# Patient Record
Sex: Male | Born: 1945 | Hispanic: No | Marital: Single | Smoking: Never smoker
Health system: Southern US, Community
[De-identification: ages and names within clinical notes are randomized; demographics above are authoritative.]

## PROBLEM LIST (undated history)

## (undated) DIAGNOSIS — I10 Essential (primary) hypertension: Secondary | ICD-10-CM

## (undated) DIAGNOSIS — E782 Mixed hyperlipidemia: Secondary | ICD-10-CM

## (undated) DIAGNOSIS — B019 Varicella without complication: Secondary | ICD-10-CM

## (undated) DIAGNOSIS — E119 Type 2 diabetes mellitus without complications: Secondary | ICD-10-CM

## (undated) DIAGNOSIS — G5603 Carpal tunnel syndrome, bilateral upper limbs: Secondary | ICD-10-CM

## (undated) DIAGNOSIS — M1990 Unspecified osteoarthritis, unspecified site: Secondary | ICD-10-CM

## (undated) DIAGNOSIS — N138 Other obstructive and reflux uropathy: Secondary | ICD-10-CM

## (undated) DIAGNOSIS — Z973 Presence of spectacles and contact lenses: Secondary | ICD-10-CM

## (undated) DIAGNOSIS — E785 Hyperlipidemia, unspecified: Secondary | ICD-10-CM

## (undated) DIAGNOSIS — N401 Enlarged prostate with lower urinary tract symptoms: Secondary | ICD-10-CM

## (undated) DIAGNOSIS — A159 Respiratory tuberculosis unspecified: Secondary | ICD-10-CM

## (undated) DIAGNOSIS — M5382 Other specified dorsopathies, cervical region: Secondary | ICD-10-CM

## (undated) HISTORY — PX: CERVICAL FUSION: SHX112

## (undated) HISTORY — PX: PROSTATE BIOPSY: SHX241

## (undated) HISTORY — DX: Hyperlipidemia, unspecified: E78.5

## (undated) HISTORY — DX: Essential (primary) hypertension: I10

## (undated) HISTORY — DX: Varicella without complication: B01.9

## (undated) HISTORY — PX: BILATERAL CARPAL TUNNEL RELEASE: SHX6508

## (undated) HISTORY — PX: NECK SURGERY: SHX720

---
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
## 1988-12-21 HISTORY — PX: ANTERIOR FUSION CERVICAL SPINE: SUR626

## 2000-04-15 ENCOUNTER — Encounter: Admission: RE | Admit: 2000-04-15 | Discharge: 2000-04-15 | Payer: Self-pay | Admitting: Neurosurgery

## 2000-04-15 ENCOUNTER — Encounter: Payer: Self-pay | Admitting: Neurosurgery

## 2009-12-21 HISTORY — PX: COLONOSCOPY: SHX174

## 2010-01-16 LAB — HM COLONOSCOPY

## 2013-10-23 ENCOUNTER — Telehealth: Payer: Self-pay | Admitting: Family Medicine

## 2013-10-23 ENCOUNTER — Other Ambulatory Visit: Payer: Self-pay | Admitting: *Deleted

## 2013-10-23 DIAGNOSIS — I10 Essential (primary) hypertension: Secondary | ICD-10-CM

## 2013-10-23 MED ORDER — ATENOLOL 100 MG PO TABS: 100.0000 mg | ORAL_TABLET | Freq: Every day | ORAL | Status: DC

## 2013-10-23 MED ORDER — HYDROCHLOROTHIAZIDE 25 MG PO TABS: 25.0000 mg | ORAL_TABLET | Freq: Every day | ORAL | Status: DC

## 2013-10-23 NOTE — Telephone Encounter (Addendum)
Patient has a new patient apt with dr Laury Axon on January 29th 2015 but he needed two rx's refilled before his apt. Patient wanted to see if he could get a 90 day supply until his visit.   Refill Atenolol 100 mg  Hydrochlorothizaide 25 mg  Phamracy [facility] on [address]

## 2013-10-23 NOTE — Telephone Encounter (Signed)
Rx for atenolol and hctz sent to [facility] on [address]. Patient hs new to establish appt on 01/18/14, needs rx filled before then.

## 2014-01-16 ENCOUNTER — Telehealth: Payer: Self-pay

## 2014-01-16 NOTE — Telephone Encounter (Signed)
Was a patient at [facility] on [address].   Medication List and allergies:  Updated and Reviewed  90 day supply/mail order: n/a Local prescriptions:  [facility] on [address]  Immunization due:  Influenza-declined; Shingles-would like to receive upon appt.  A/P: No changes to personal, family or PSH Flu-Declined Tdap- "received more than a year ago" PNA-  "received more than a year ago" Shingles-Requesting  CCS-3-4 years ago per patient PSA- None on file   To discuss with provider: Wants to discuss knee pain.  Had an ortho. referral and says he needs another one.

## 2014-01-18 ENCOUNTER — Ambulatory Visit (INDEPENDENT_AMBULATORY_CARE_PROVIDER_SITE_OTHER): Payer: Medicare Other | Admitting: Family Medicine

## 2014-01-18 ENCOUNTER — Encounter: Payer: Self-pay | Admitting: Family Medicine

## 2014-01-18 VITALS — BP 120/68 | HR 83 | Temp 98.5°F | Ht 67.5 in | Wt 206.4 lb

## 2014-01-18 DIAGNOSIS — M25569 Pain in unspecified knee: Secondary | ICD-10-CM

## 2014-01-18 DIAGNOSIS — E669 Obesity, unspecified: Secondary | ICD-10-CM

## 2014-01-18 DIAGNOSIS — M25562 Pain in left knee: Secondary | ICD-10-CM

## 2014-01-18 DIAGNOSIS — E782 Mixed hyperlipidemia: Secondary | ICD-10-CM | POA: Insufficient documentation

## 2014-01-18 DIAGNOSIS — E785 Hyperlipidemia, unspecified: Secondary | ICD-10-CM

## 2014-01-18 DIAGNOSIS — Z23 Encounter for immunization: Secondary | ICD-10-CM

## 2014-01-18 DIAGNOSIS — I10 Essential (primary) hypertension: Secondary | ICD-10-CM

## 2014-01-18 DIAGNOSIS — M25561 Pain in right knee: Secondary | ICD-10-CM

## 2014-01-18 HISTORY — DX: Obesity, unspecified: E66.9

## 2014-01-18 MED ORDER — HYDROCHLOROTHIAZIDE 25 MG PO TABS: 25.0000 mg | ORAL_TABLET | Freq: Every day | ORAL | Status: DC

## 2014-01-18 MED ORDER — ZOSTER VACCINE LIVE 19400 UNT/0.65ML ~~LOC~~ SOLR: 0.6500 mL | Freq: Once | SUBCUTANEOUS | Status: DC

## 2014-01-18 MED ORDER — ATENOLOL 100 MG PO TABS: 100.0000 mg | ORAL_TABLET | Freq: Every day | ORAL | Status: DC

## 2014-01-18 NOTE — Progress Notes (Signed)
  Subjective:    Richard Harding is a 68 y.o. male who presents with knee pain involving both knees. Onset was gradual, starting about several months ago. Inciting event: none known. Current symptoms include: pain located Medially L > R. Pain is aggravated by any weight bearing. Patient has had prior knee problems R knee. Evaluation to date: plain films: oa  and Ortho consult: Dr Mayer Camel. Treatment to date: avoidance of offending activity and brace which is not very effective.  The following portions of the patient's history were reviewed and updated as appropriate: allergies, current medications, past family history, past medical history, past social history, past surgical history and problem list.   Review of Systems Pertinent items are noted in HPI.   Objective:    BP 120/68  Pulse 83  Temp(Src) 98.5 F (36.9 C) (Oral)  Ht 5' 7.5" (1.715 m)  Wt 206 lb 6.4 oz (93.622 kg)  BMI 31.83 kg/m2  SpO2 97%      General appearance: alert, cooperative, appears stated age and no distress Neck: no adenopathy, no carotid bruit, no JVD, supple, symmetrical, trachea midline and thyroid not enlarged, symmetric, no tenderness/mass/nodules Lungs: clear to auscultation bilaterally Heart: S1, S2 normal Extremities: extremities normal, atraumatic, no cyanosis or edema  Right knee: normal and no effusion, full active range of motion, no joint line tenderness, ligamentous structures intact.  Left knee:  positive exam findings: effusion and crepitus   X-ray both knees: no fracture, dislocation, swelling or degenerative changes noted and not done    Assessment:    Bilateral knee pain     Plan:    Orthopedics referral.  Knee sleeve

## 2014-01-18 NOTE — Assessment & Plan Note (Signed)
Check labs Pt states he will not go on a statin

## 2014-01-18 NOTE — Progress Notes (Signed)
Pre visit review using our clinic review tool, if applicable. No additional management support is needed unless otherwise documented below in the visit note. 

## 2014-01-18 NOTE — Patient Instructions (Signed)

## 2014-01-18 NOTE — Assessment & Plan Note (Signed)
Stable con't meds  rto cpe 

## 2014-01-19 ENCOUNTER — Telehealth: Payer: Self-pay | Admitting: Family Medicine

## 2014-01-19 ENCOUNTER — Other Ambulatory Visit: Payer: Medicare Other

## 2014-01-19 LAB — BASIC METABOLIC PANEL
BUN: 16 mg/dL (ref 6–23)
CALCIUM: 9.6 mg/dL (ref 8.4–10.5)
CO2: 30 mEq/L (ref 19–32)
CREATININE: 0.9 mg/dL (ref 0.4–1.5)
Chloride: 95 mEq/L — ABNORMAL LOW (ref 96–112)
GFR: 86.24 mL/min (ref >60.00)
GLUCOSE: 99 mg/dL (ref 70–99)
Potassium: 3.3 mEq/L — ABNORMAL LOW (ref 3.5–5.1)
SODIUM: 136 meq/L (ref 135–145)

## 2014-01-19 LAB — LIPID PANEL
CHOLESTEROL: 231 mg/dL — AB (ref 0–200)
HDL: 33.8 mg/dL — AB (ref >39.00)
Total CHOL/HDL Ratio: 7
Triglycerides: 353 mg/dL — ABNORMAL HIGH (ref 0.0–149.0)
VLDL: 70.6 mg/dL — ABNORMAL HIGH (ref 0.0–40.0)

## 2014-01-19 LAB — HEPATIC FUNCTION PANEL
ALK PHOS: 58 U/L (ref 39–117)
ALT: 33 U/L (ref 0–53)
AST: 22 U/L (ref 0–37)
Albumin: 4 g/dL (ref 3.5–5.2)
Bilirubin, Direct: 0 mg/dL (ref 0.0–0.3)
TOTAL PROTEIN: 7.7 g/dL (ref 6.0–8.3)
Total Bilirubin: 1.1 mg/dL (ref 0.3–1.2)

## 2014-01-19 LAB — LDL CHOLESTEROL, DIRECT: Direct LDL: 138.6 mg/dL

## 2014-01-19 NOTE — Telephone Encounter (Signed)
Relevant patient education mailed to patient.  

## 2014-09-03 ENCOUNTER — Ambulatory Visit (INDEPENDENT_AMBULATORY_CARE_PROVIDER_SITE_OTHER): Payer: Medicare Other | Admitting: Family Medicine

## 2014-09-03 ENCOUNTER — Encounter: Payer: Self-pay | Admitting: Family Medicine

## 2014-09-03 VITALS — BP 142/78 | HR 94 | Temp 99.3°F | Wt 212.5 lb

## 2014-09-03 DIAGNOSIS — R599 Enlarged lymph nodes, unspecified: Secondary | ICD-10-CM

## 2014-09-03 DIAGNOSIS — R591 Generalized enlarged lymph nodes: Secondary | ICD-10-CM

## 2014-09-03 DIAGNOSIS — I10 Essential (primary) hypertension: Secondary | ICD-10-CM

## 2014-09-03 DIAGNOSIS — E785 Hyperlipidemia, unspecified: Secondary | ICD-10-CM

## 2014-09-03 MED ORDER — HYDROCHLOROTHIAZIDE 25 MG PO TABS: 25.0000 mg | ORAL_TABLET | Freq: Every day | ORAL | Status: DC

## 2014-09-03 MED ORDER — ATENOLOL 100 MG PO TABS: ORAL_TABLET | ORAL | Status: DC

## 2014-09-03 NOTE — Progress Notes (Signed)
  Subjective:    Patient here for follow-up of elevated blood pressure.  He is not exercising and is adherent to a low-salt diet.  Blood pressure is not well controlled at home. Cardiac symptoms: none. Patient denies: chest pain, chest pressure/discomfort, claudication, dyspnea, exertional chest pressure/discomfort, fatigue, irregular heart beat, lower extremity edema, near-syncope, orthopnea, palpitations, paroxysmal nocturnal dyspnea, syncope and tachypnea. Cardiovascular risk factors: advanced age (older than 73 for men, 21 for women), dyslipidemia, hypertension and male gender. Use of agents associated with hypertension: none. History of target organ damage: none.  Pt c/o enlarged lymphnodes x 1 year.  No recent illness, no fever.  The following portions of the patient's history were reviewed and updated as appropriate: allergies, current medications, past family history, past medical history, past social history, past surgical history and problem list.  Review of Systems Pertinent items are noted in HPI.     Objective:    BP 142/78  Pulse 94  Temp(Src) 99.3 F (37.4 C) (Oral)  Wt 212 lb 8.4 oz (96.4 kg)  SpO2 99% General appearance: alert, cooperative, appears stated age and no distress Throat: lips, mucosa, and tongue normal; teeth and gums normal Neck: moderate anterior cervical adenopathy, supple, symmetrical, trachea midline and thyroid not enlarged, symmetric, no tenderness/mass/nodules Lungs: clear to auscultation bilaterally Heart: S1, S2 normal Extremities: extremities normal, atraumatic, no cyanosis or edema    Assessment:    Hypertension, stage 1 . Evidence of target organ damage: none.    Plan:    Medication: increase to atenolol 150 mg daily. Dietary sodium restriction. Regular aerobic exercise. Check blood pressures 2-3 times weekly and record. Follow up: 3 months and as needed.   1. Essential hypertension, benign   - hydrochlorothiazide (HYDRODIURIL) 25 MG  tablet; Take 1 tablet (25 mg total) by mouth daily.  Dispense: 90 tablet; Refill: 3 - atenolol (TENORMIN) 100 MG tablet; 1 1/2 tab po qd  Dispense: 135 tablet; Refill: 3  2. Lymphadenopathy US neck - CBC with Differential  3. Other and unspecified hyperlipidemia Check labs - Basic metabolic panel - Hepatic function panel - Lipid panel

## 2014-09-03 NOTE — Patient Instructions (Signed)

## 2014-09-03 NOTE — Progress Notes (Signed)
Pre visit review using our clinic review tool, if applicable. No additional management support is needed unless otherwise documented below in the visit note. 

## 2014-09-04 LAB — LIPID PANEL
Cholesterol: 213 mg/dL — ABNORMAL HIGH (ref 0–200)
HDL: 30.8 mg/dL — ABNORMAL LOW (ref >39.00)
NonHDL: 182.2
Total CHOL/HDL Ratio: 7
Triglycerides: 553 mg/dL — ABNORMAL HIGH (ref 0.0–149.0)
VLDL: 110.6 mg/dL — ABNORMAL HIGH (ref 0.0–40.0)

## 2014-09-04 LAB — BASIC METABOLIC PANEL
BUN: 10 mg/dL (ref 6–23)
CO2: 26 mEq/L (ref 19–32)
Calcium: 9.5 mg/dL (ref 8.4–10.5)
Chloride: 97 mEq/L (ref 96–112)
Creatinine, Ser: 0.9 mg/dL (ref 0.4–1.5)
GFR: 89.39 mL/min (ref >60.00)
Glucose, Bld: 133 mg/dL — ABNORMAL HIGH (ref 70–99)
Potassium: 3.2 mEq/L — ABNORMAL LOW (ref 3.5–5.1)
Sodium: 136 mEq/L (ref 135–145)

## 2014-09-04 LAB — LDL CHOLESTEROL, DIRECT: Direct LDL: 123.7 mg/dL

## 2014-09-04 LAB — HEPATIC FUNCTION PANEL
ALT: 37 U/L (ref 0–53)
AST: 30 U/L (ref 0–37)
Albumin: 3.9 g/dL (ref 3.5–5.2)
Alkaline Phosphatase: 50 U/L (ref 39–117)
Bilirubin, Direct: 0 mg/dL (ref 0.0–0.3)
Total Bilirubin: 0.4 mg/dL (ref 0.2–1.2)
Total Protein: 7.3 g/dL (ref 6.0–8.3)

## 2014-09-05 ENCOUNTER — Ambulatory Visit (HOSPITAL_BASED_OUTPATIENT_CLINIC_OR_DEPARTMENT_OTHER)
Admission: RE | Admit: 2014-09-05 | Discharge: 2014-09-05 | Disposition: A | Discharge status: Home / Self Care | Payer: Medicare Other | Source: Ambulatory Visit | Attending: Family Medicine | Admitting: Family Medicine

## 2014-09-05 DIAGNOSIS — R591 Generalized enlarged lymph nodes: Secondary | ICD-10-CM

## 2014-09-05 DIAGNOSIS — E041 Nontoxic single thyroid nodule: Secondary | ICD-10-CM | POA: Insufficient documentation

## 2014-09-05 DIAGNOSIS — E049 Nontoxic goiter, unspecified: Secondary | ICD-10-CM | POA: Diagnosis present

## 2014-09-05 LAB — CBC WITH DIFFERENTIAL/PLATELET
Basophils Absolute: 0 10*3/uL (ref 0.0–0.1)
Basophils Relative: 0.5 % (ref 0.0–3.0)
Eosinophils Absolute: 0.1 10*3/uL (ref 0.0–0.7)
Eosinophils Relative: 1.1 % (ref 0.0–5.0)
HCT: 46 % (ref 39.0–52.0)
Hemoglobin: 15.8 g/dL (ref 13.0–17.0)
Lymphocytes Relative: 34.1 % (ref 12.0–46.0)
Lymphs Abs: 2.7 10*3/uL (ref 0.7–4.0)
MCHC: 34.3 g/dL (ref 30.0–36.0)
MCV: 87.6 fl (ref 78.0–100.0)
Monocytes Absolute: 0.4 10*3/uL (ref 0.1–1.0)
Monocytes Relative: 4.5 % (ref 3.0–12.0)
Neutro Abs: 4.7 10*3/uL (ref 1.4–7.7)
Neutrophils Relative %: 59.8 % (ref 43.0–77.0)
Platelets: 243 10*3/uL (ref 150.0–400.0)
RBC: 5.26 Mil/uL (ref 4.22–5.81)
RDW: 12.6 % (ref 11.5–15.5)
WBC: 7.8 10*3/uL (ref 4.0–10.5)

## 2014-09-07 ENCOUNTER — Telehealth: Payer: Self-pay

## 2014-09-07 DIAGNOSIS — E010 Iodine-deficiency related diffuse (endemic) goiter: Secondary | ICD-10-CM

## 2014-09-07 NOTE — Telephone Encounter (Signed)
Message copied by Ewing Schlein on Fri Sep 07, 2014  9:12 AM ------      Message from: Rosalita Chessman      Created: Wed Sep 05, 2014 10:04 PM       K is low---  Add Kcl 20  Meq 1po qd , #30 2 refills      TG are high---  Fenofibrate 160  Mg #30 1 po qd, 2 refills      Recheck 3 months------ bmp, hep, lipid  272.4 ------

## 2014-09-07 NOTE — Telephone Encounter (Signed)
There is no K supplement on the $4 list

## 2014-09-07 NOTE — Telephone Encounter (Signed)
spoke with patient and he said he stated he does not have prescription coverage and would like a medication on the 4 dollar list at [facility]. List placed on the ledge.    KP

## 2014-09-10 ENCOUNTER — Telehealth: Payer: Self-pay | Admitting: Family Medicine

## 2014-09-10 MED ORDER — FENOFIBRATE 160 MG PO TABS: 160.0000 mg | ORAL_TABLET | Freq: Every day | ORAL | Status: DC

## 2014-09-10 MED ORDER — POTASSIUM CHLORIDE CRYS ER 20 MEQ PO TBCR: 20.0000 meq | EXTENDED_RELEASE_TABLET | Freq: Every day | ORAL | Status: DC

## 2014-09-10 NOTE — Telephone Encounter (Signed)
Mailed      KP

## 2014-09-10 NOTE — Telephone Encounter (Signed)
There is nothing on $4 list for TG

## 2014-09-10 NOTE — Telephone Encounter (Signed)
For his triglycerides.     KP

## 2014-09-10 NOTE — Telephone Encounter (Signed)
Requesting lab work and [place] report mailed to his home

## 2015-02-01 ENCOUNTER — Ambulatory Visit: Payer: Medicare Other | Attending: Orthopedic Surgery | Admitting: Physical Therapy

## 2015-02-01 DIAGNOSIS — M5416 Radiculopathy, lumbar region: Secondary | ICD-10-CM | POA: Insufficient documentation

## 2015-02-01 DIAGNOSIS — M545 Low back pain: Secondary | ICD-10-CM | POA: Diagnosis not present

## 2015-02-04 ENCOUNTER — Encounter: Payer: Self-pay | Admitting: Physical Therapy

## 2015-02-08 ENCOUNTER — Ambulatory Visit: Payer: Medicare Other | Admitting: Physical Therapy

## 2015-02-08 ENCOUNTER — Encounter: Payer: Self-pay | Admitting: Physical Therapy

## 2015-02-08 DIAGNOSIS — M5416 Radiculopathy, lumbar region: Secondary | ICD-10-CM

## 2015-02-08 NOTE — Therapy (Signed)
[place], [place], [address] Phone: [phone]   Fax:  [phone]  Physical Therapy Treatment  Patient Details  Name: Richard Harding MRN: [id number] Date of Birth: [birth year] Referring Provider:  Elie Goody, *  Encounter Date: 02/08/2015      PT End of Session - 02/08/15 1033    Visit Number 2   Date for PT Re-Evaluation 04/03/15   PT Start Time 0925   PT Stop Time 1030   PT Time Calculation (min) 65 min      Past Medical History  Diagnosis Date  . Hypertension   . Hyperlipemia   . Chickenpox     Past Surgical History  Procedure Laterality Date  . Neck surgery      There were no vitals taken for this visit.  Visit Diagnosis:  Lumbar radiculopathy      Subjective Assessment - 02/08/15 0933    Symptoms Reports that since last visit he has less pain and is taking less pain medication   Limitations Sitting;Standing;Walking   How long can you stand comfortably? 15 minutes   How long can you walk comfortably? 10 minutes   Patient Stated Goals no pain   Currently in Pain? Yes   Pain Score 3    Pain Location Back   Pain Orientation Lower;Right   Pain Descriptors / Indicators Aching;Radiating;Sharp   Pain Type Acute pain   Pain Radiating Towards right thigh   Pain Onset More than a month ago   Pain Frequency Intermittent   Aggravating Factors  standing still   Pain Relieving Factors easy movements   Effect of Pain on Daily Activities limited with standing   Multiple Pain Sites No                    [facility] Adult PT Treatment/Exercise - 02/08/15 0001    Bed Mobility   Bed Mobility --  instructed in proper body mechanics   Lumbar Exercises: Stretches   Passive Hamstring Stretch 30 seconds  3 reps   Single Knee to Chest Stretch 5 reps;20 seconds   Double Knee to Chest Stretch 5 reps;10 seconds   Lower Trunk Rotation 5 reps;10 seconds   Pelvic Tilt 5  reps;10 seconds   Piriformis Stretch 30 seconds;5 reps   Lumbar Exercises: Machines for Strengthening   Other Lumbar Machine Exercise NuSteo 5 minutes level 5   Lumbar Exercises: Supine   Glut Set 10 reps;3 seconds   Dead Bug 5 reps;2 seconds   Lumbar Exercises: Prone   Opposite Arm/Leg Raise 10 reps;1 second   Lumbar Exercises: Quadruped   Single Arm Raise 5 reps;2 seconds   Straight Leg Raise 5 reps;2 seconds   Opposite Arm/Leg Raise 5 reps;2 seconds   Modalities   Modalities Ultrasound   Ultrasound   Ultrasound Location right buttock SI area   Ultrasound Goals Pain                PT Education - 02/08/15 1032    Education provided Yes   Education Details body mechanics, flexibility and core strength   Person(s) Educated Patient   Methods Explanation;Demonstration;Tactile cues;Verbal cues;Handout   Comprehension Verbal cues required;Returned demonstration;Verbalized understanding             PT Long Term Goals - 02/08/15 1035    PT LONG TERM GOAL #1   Title independent with proper posture and bidy mechanics   Time 4  Period Weeks   Status New   PT LONG TERM GOAL #2   Title independent with HEP   Time 4   Period Weeks   Status New               Plan - 02/08/15 1033    Clinical Impression Statement Patient has lumbar radiculopathy, he reports less pain after first PT session, taking less pain medication.  Has higher co-pay so wants to limit visits   Pt will benefit from skilled therapeutic intervention in order to improve on the following deficits Pain;Impaired flexibility   Rehab Potential Good   PT Frequency 1x / week   PT Duration 4 weeks   PT Treatment/Interventions Ultrasound;Traction;Moist Heat;Therapeutic activities;Therapeutic exercise;Passive range of motion;Patient/family education   PT Next Visit Plan will hold PT due to high co-pay and him feeling confident with the less pain and his HEP   Consulted and Agree with Plan of Care  Patient        Problem List Patient Active Problem List   Diagnosis Date Noted  . HTN (hypertension) 01/18/2014  . Other and unspecified hyperlipidemia 01/18/2014  . Obesity (BMI 30-39.9) 01/18/2014    ALBRIGHT,MICHAEL W,PT 02/08/2015, 10:37 AM  [place] [address] [place], [place], [address] Phone: [phone]   Fax:  [phone]

## 2015-09-16 ENCOUNTER — Ambulatory Visit (INDEPENDENT_AMBULATORY_CARE_PROVIDER_SITE_OTHER): Payer: Medicare Other | Admitting: Family Medicine

## 2015-09-16 ENCOUNTER — Encounter: Payer: Self-pay | Admitting: Family Medicine

## 2015-09-16 VITALS — BP 132/72 | HR 73 | Temp 98.3°F | Ht 68.0 in | Wt 203.2 lb

## 2015-09-16 DIAGNOSIS — Z23 Encounter for immunization: Secondary | ICD-10-CM | POA: Diagnosis not present

## 2015-09-16 DIAGNOSIS — H6123 Impacted cerumen, bilateral: Secondary | ICD-10-CM | POA: Diagnosis not present

## 2015-09-16 DIAGNOSIS — E785 Hyperlipidemia, unspecified: Secondary | ICD-10-CM | POA: Diagnosis not present

## 2015-09-16 DIAGNOSIS — I10 Essential (primary) hypertension: Secondary | ICD-10-CM | POA: Diagnosis not present

## 2015-09-16 DIAGNOSIS — Z1159 Encounter for screening for other viral diseases: Secondary | ICD-10-CM

## 2015-09-16 DIAGNOSIS — H918X3 Other specified hearing loss, bilateral: Secondary | ICD-10-CM

## 2015-09-16 LAB — COMPLETE METABOLIC PANEL WITH GFR
ALT: 26 U/L (ref 9–46)
AST: 18 U/L (ref 10–35)
Albumin: 4.2 g/dL (ref 3.6–5.1)
Alkaline Phosphatase: 49 U/L (ref 40–115)
BUN: 14 mg/dL (ref 7–25)
CHLORIDE: 96 mmol/L — AB (ref 98–110)
CO2: 28 mmol/L (ref 20–31)
CREATININE: 0.85 mg/dL (ref 0.70–1.25)
Calcium: 9.5 mg/dL (ref 8.6–10.3)
Glucose, Bld: 119 mg/dL — ABNORMAL HIGH (ref 65–99)
Potassium: 3.6 mmol/L (ref 3.5–5.3)
Sodium: 134 mmol/L — ABNORMAL LOW (ref 135–146)
Total Bilirubin: 0.6 mg/dL (ref 0.2–1.2)
Total Protein: 7.2 g/dL (ref 6.1–8.1)

## 2015-09-16 LAB — LDL CHOLESTEROL, DIRECT: Direct LDL: 157 mg/dL

## 2015-09-16 LAB — LIPID PANEL
CHOLESTEROL: 227 mg/dL — AB (ref 0–200)
HDL: 35.2 mg/dL — AB (ref >39.00)
NonHDL: 191.31
TRIGLYCERIDES: 342 mg/dL — AB (ref 0.0–149.0)
Total CHOL/HDL Ratio: 6
VLDL: 68.4 mg/dL — ABNORMAL HIGH (ref 0.0–40.0)

## 2015-09-16 MED ORDER — HYDROCHLOROTHIAZIDE 25 MG PO TABS: 25.0000 mg | ORAL_TABLET | Freq: Every day | ORAL | Status: DC

## 2015-09-16 MED ORDER — ATENOLOL 100 MG PO TABS: ORAL_TABLET | ORAL | Status: DC

## 2015-09-16 NOTE — Patient Instructions (Signed)

## 2015-09-16 NOTE — Progress Notes (Signed)
Pre visit review using our clinic review tool, if applicable. No additional management support is needed unless otherwise documented below in the visit note. 

## 2015-09-16 NOTE — Assessment & Plan Note (Signed)
Unable to use cerumen hoop Irrigated with some success but still wax deep in. Use debrox---- rto 1 week prn

## 2015-09-16 NOTE — Progress Notes (Signed)
Patient ID: Richard Harding, male    DOB: 06-18-1946  Age: 69 y.o. MRN: [id number]    Subjective:  Subjective HPI Richard Harding presents for f/u cholesterol and bp.   He denies cp, htn  Review of Systems  Constitutional: Negative for diaphoresis, appetite change, fatigue and unexpected weight change.  HENT: Positive for ear pain and hearing loss. Negative for ear discharge, rhinorrhea, sinus pressure, sneezing, sore throat, tinnitus, trouble swallowing and voice change.   Eyes: Negative for pain, redness and visual disturbance.  Respiratory: Negative for cough, chest tightness, shortness of breath and wheezing.   Cardiovascular: Negative for chest pain, palpitations and leg swelling.  Endocrine: Negative for cold intolerance, heat intolerance, polydipsia, polyphagia and polyuria.  Genitourinary: Negative for dysuria, frequency and difficulty urinating.  Neurological: Negative for dizziness, light-headedness, numbness and headaches.    History Past Medical History  Diagnosis Date  . Hypertension   . Hyperlipemia   . Chickenpox     He has past surgical history that includes Neck surgery.   His family history is not on file.He reports that he has never smoked. He has never used smokeless tobacco. He reports that he does not drink alcohol or use illicit drugs.  Current Outpatient Prescriptions on File Prior to Visit  Medication Sig Dispense Refill  . Ascorbic Acid (VITAMIN C) 1000 MG tablet Take 2,000 mg by mouth daily.    Marland Kitchen aspirin 325 MG tablet Take 325 mg by mouth daily.    Marland Kitchen b complex vitamins tablet Take 2 tablets by mouth daily.    . fenofibrate 160 MG tablet Take 1 tablet (160 mg total) by mouth daily. 30 tablet 2  . Flaxseed, Linseed, (FLAX SEED OIL PO) Take 3 capsules by mouth daily. 1400 mg each    . GLUCOSA-CHONDR-NA CHONDR-MSM PO Take 2 capsules by mouth daily.    . Misc Natural Products (PROSTATE HEALTH PO) Take 1 capsule by mouth daily.     . Multiple Vitamin  (MULTIVITAMIN) tablet Take 1 tablet by mouth daily.    . Omega-3 Fatty Acids (FISH OIL) 1000 MG CAPS Take 4 capsules by mouth daily.    . potassium chloride SA (K-DUR,KLOR-CON) 20 MEQ tablet Take 1 tablet (20 mEq total) by mouth daily. 30 tablet 2  . niacin 500 MG tablet Take 500 mg by mouth daily.     No current facility-administered medications on file prior to visit.     Objective:  Objective Physical Exam  Constitutional: He is oriented to person, place, and time. Vital signs are normal. He appears well-developed and well-nourished. He is sleeping.  HENT:  Head: Normocephalic and atraumatic.  Mouth/Throat: Oropharynx is clear and moist.  Both ears + cerumen-- unable to remove with hoop Ears irrigated---- still with some wax Very deep in canal  Eyes: EOM are normal. Pupils are equal, round, and reactive to light.  Neck: Normal range of motion. Neck supple. No thyromegaly present.  Cardiovascular: Normal rate and regular rhythm.   No murmur heard. Pulmonary/Chest: Effort normal and breath sounds normal. No respiratory distress. He has no wheezes. He has no rales. He exhibits no tenderness.  Musculoskeletal: He exhibits no edema or tenderness.  Neurological: He is alert and oriented to person, place, and time.  Skin: Skin is warm and dry.  Psychiatric: He has a normal mood and affect. His behavior is normal. Judgment and thought content normal.   BP 132/72 mmHg  Pulse 73  Temp(Src) 98.3 F (36.8 C) (Oral)  Ht 5\' 8"  (  1.727 m)  Wt 203 lb 3.2 oz (92.171 kg)  BMI 30.90 kg/m2  SpO2 98% Wt Readings from Last 3 Encounters:  09/16/15 203 lb 3.2 oz (92.171 kg)  09/03/14 212 lb 8.4 oz (96.4 kg)  01/18/14 206 lb 6.4 oz (93.622 kg)     Lab Results  Component Value Date   WBC 7.8 09/03/2014   HGB 15.8 09/03/2014   HCT 46.0 09/03/2014   PLT 243.0 09/03/2014   GLUCOSE 133* 09/03/2014   CHOL 227* 09/16/2015   TRIG 342.0* 09/16/2015   HDL 35.20* 09/16/2015   LDLDIRECT 157.0  09/16/2015   ALT 37 09/03/2014   AST 30 09/03/2014   NA 136 09/03/2014   K 3.2* 09/03/2014   CL 97 09/03/2014   CREATININE 0.9 09/03/2014   BUN 10 09/03/2014   CO2 26 09/03/2014    US Soft Tissue Head/neck  09/05/2014   CLINICAL DATA:  Thyromegaly  EXAM: THYROID ULTRASOUND  TECHNIQUE: Ultrasound examination of the thyroid gland and adjacent soft tissues was performed.  COMPARISON:  None.  FINDINGS: Right thyroid lobe  Measurements: 4.7 x 1.9 x 1.7 cm. 4 mm hypoechoic anterior superior nodule.  Left thyroid lobe  Measurements: 4.4 x 1.6 x 1.4 cm. Heterogeneous gland without nodule.  Isthmus  Thickness: 3 mm.  No nodules visualized.  Lymphadenopathy  None visualized.  IMPRESSION: 4 mm right upper pole nodule. Heterogeneous glandular tissue. Findings do not meet current SRU consensus criteria for biopsy. Follow-up by clinical exam is recommended. If patient has known risk factors for thyroid carcinoma, consider follow-up ultrasound in 12 months. If patient is clinically hyperthyroid, consider nuclear medicine thyroid uptake and scan.Reference: Management of Thyroid Nodules Detected at [place]: Society of Radiologists in [place]. Radiology 2005; [id number].   Electronically Signed   By: Maryclare Bean M.D.   On: 09/05/2014 16:35     Assessment & Plan:  Plan I have discontinued [facility]. [facility] Wort, calcium carbonate, and vitamin E. I am also having him maintain his aspirin, Fish Oil, vitamin C, (Flaxseed, Linseed, (FLAX SEED OIL PO)), Misc Natural Products (PROSTATE HEALTH PO), b complex vitamins, niacin, multivitamin, GLUCOSA-CHONDR-NA CHONDR-MSM PO, fenofibrate, potassium chloride SA, atenolol, and hydrochlorothiazide.  Meds ordered this encounter  Medications  . atenolol (TENORMIN) 100 MG tablet    Sig: 1 1/2 tab po qd    Dispense:  135 tablet    Refill:  3  . hydrochlorothiazide (HYDRODIURIL) 25 MG tablet    Sig: Take 1 tablet (25 mg total) by mouth daily.     Dispense:  90 tablet    Refill:  3    Problem List Items Addressed This Visit    Bilateral hearing loss due to cerumen impaction    Unable to use cerumen hoop Irrigated with some success but still wax deep in. Use debrox---- rto 1 week prn       Other Visit Diagnoses    Essential hypertension    -  Primary    Relevant Medications    atenolol (TENORMIN) 100 MG tablet    hydrochlorothiazide (HYDRODIURIL) 25 MG tablet    Other Relevant Orders    COMPLETE METABOLIC PANEL WITH GFR    Lipid panel (Completed)    Hyperlipidemia        Relevant Medications    atenolol (TENORMIN) 100 MG tablet    hydrochlorothiazide (HYDRODIURIL) 25 MG tablet    Other Relevant Orders    COMPLETE METABOLIC PANEL WITH GFR    Lipid panel (  Completed)    Essential hypertension, benign        Relevant Medications    atenolol (TENORMIN) 100 MG tablet    hydrochlorothiazide (HYDRODIURIL) 25 MG tablet    Need for hepatitis C screening test        Relevant Orders    Hepatitis C antibody    Encounter for immunization        Need for vaccination with 13-polyvalent pneumococcal conjugate vaccine        Relevant Orders    Pneumococcal conjugate vaccine 13-valent (Completed)       Follow-up: Return in about 6 months (around 03/15/2016), or if symptoms worsen or fail to improve, for annual exam, fasting.  Garnet Koyanagi, DO

## 2015-09-17 LAB — HEPATITIS C ANTIBODY: HCV Ab: NEGATIVE

## 2015-09-25 ENCOUNTER — Other Ambulatory Visit: Payer: Self-pay

## 2015-09-25 MED ORDER — OMEGA-3-ACID ETHYL ESTERS 1 G PO CAPS: 2.0000 g | ORAL_CAPSULE | Freq: Two times a day (BID) | ORAL | Status: DC

## 2016-01-13 ENCOUNTER — Telehealth: Payer: Self-pay | Admitting: Family Medicine

## 2016-01-13 MED ORDER — OMEGA-3-ACID ETHYL ESTERS 1 G PO CAPS: 2.0000 g | ORAL_CAPSULE | Freq: Two times a day (BID) | ORAL | Status: DC

## 2016-01-13 NOTE — Telephone Encounter (Signed)
[facility] [address]  Pt needs omega-3 acid ethyl esters (LOVAZA) 1 G capsule sent in. He's going today.

## 2016-01-13 NOTE — Telephone Encounter (Signed)
Rx faxed, apt scheduled for March.     KP

## 2016-03-16 ENCOUNTER — Encounter: Payer: Self-pay | Admitting: Family Medicine

## 2016-03-16 ENCOUNTER — Ambulatory Visit (INDEPENDENT_AMBULATORY_CARE_PROVIDER_SITE_OTHER): Payer: Medicare Other | Admitting: Family Medicine

## 2016-03-16 VITALS — BP 128/80 | HR 69 | Temp 98.9°F | Ht 68.0 in | Wt 206.0 lb

## 2016-03-16 DIAGNOSIS — E785 Hyperlipidemia, unspecified: Secondary | ICD-10-CM

## 2016-03-16 DIAGNOSIS — M170 Bilateral primary osteoarthritis of knee: Secondary | ICD-10-CM

## 2016-03-16 DIAGNOSIS — I10 Essential (primary) hypertension: Secondary | ICD-10-CM | POA: Diagnosis not present

## 2016-03-16 DIAGNOSIS — Z0000 Encounter for general adult medical examination without abnormal findings: Secondary | ICD-10-CM

## 2016-03-16 DIAGNOSIS — H538 Other visual disturbances: Secondary | ICD-10-CM | POA: Diagnosis not present

## 2016-03-16 DIAGNOSIS — R82998 Other abnormal findings in urine: Secondary | ICD-10-CM

## 2016-03-16 DIAGNOSIS — R8299 Other abnormal findings in urine: Secondary | ICD-10-CM

## 2016-03-16 LAB — LIPID PANEL
CHOLESTEROL: 228 mg/dL — AB (ref 0–200)
HDL: 34.5 mg/dL — ABNORMAL LOW (ref >39.00)
NonHDL: 193.63
TRIGLYCERIDES: 314 mg/dL — AB (ref 0.0–149.0)
Total CHOL/HDL Ratio: 7
VLDL: 62.8 mg/dL — ABNORMAL HIGH (ref 0.0–40.0)

## 2016-03-16 LAB — COMPREHENSIVE METABOLIC PANEL
ALBUMIN: 4.4 g/dL (ref 3.5–5.2)
ALK PHOS: 54 U/L (ref 39–117)
ALT: 26 U/L (ref 0–53)
AST: 17 U/L (ref 0–37)
BUN: 11 mg/dL (ref 6–23)
CO2: 31 mEq/L (ref 19–32)
CREATININE: 0.89 mg/dL (ref 0.40–1.50)
Calcium: 9.7 mg/dL (ref 8.4–10.5)
Chloride: 97 mEq/L (ref 96–112)
GFR: 90.14 mL/min (ref >60.00)
Glucose, Bld: 131 mg/dL — ABNORMAL HIGH (ref 70–99)
POTASSIUM: 3.8 meq/L (ref 3.5–5.1)
Sodium: 138 mEq/L (ref 135–145)
TOTAL PROTEIN: 7.3 g/dL (ref 6.0–8.3)
Total Bilirubin: 0.7 mg/dL (ref 0.2–1.2)

## 2016-03-16 LAB — CBC WITH DIFFERENTIAL/PLATELET
Basophils Absolute: 0 10*3/uL (ref 0.0–0.1)
Basophils Relative: 0.4 % (ref 0.0–3.0)
EOS ABS: 0.1 10*3/uL (ref 0.0–0.7)
EOS PCT: 0.9 % (ref 0.0–5.0)
HCT: 45.2 % (ref 39.0–52.0)
HEMOGLOBIN: 15.4 g/dL (ref 13.0–17.0)
LYMPHS ABS: 2.7 10*3/uL (ref 0.7–4.0)
Lymphocytes Relative: 34.3 % (ref 12.0–46.0)
MCHC: 34.1 g/dL (ref 30.0–36.0)
MCV: 85.6 fl (ref 78.0–100.0)
Monocytes Absolute: 0.6 10*3/uL (ref 0.1–1.0)
Monocytes Relative: 7.3 % (ref 3.0–12.0)
NEUTROS PCT: 57.1 % (ref 43.0–77.0)
Neutro Abs: 4.6 10*3/uL (ref 1.4–7.7)
Platelets: 262 10*3/uL (ref 150.0–400.0)
RBC: 5.28 Mil/uL (ref 4.22–5.81)
RDW: 12.3 % (ref 11.5–15.5)
WBC: 8 10*3/uL (ref 4.0–10.5)

## 2016-03-16 LAB — LDL CHOLESTEROL, DIRECT: Direct LDL: 144 mg/dL

## 2016-03-16 LAB — PSA: PSA: 9.05 ng/mL — AB (ref 0.10–4.00)

## 2016-03-16 MED ORDER — ATENOLOL 100 MG PO TABS: ORAL_TABLET | ORAL | Status: DC

## 2016-03-16 MED ORDER — HYDROCHLOROTHIAZIDE 25 MG PO TABS: 25.0000 mg | ORAL_TABLET | Freq: Every day | ORAL | Status: DC

## 2016-03-16 MED ORDER — OMEGA-3-ACID ETHYL ESTERS 1 G PO CAPS: 2.0000 g | ORAL_CAPSULE | Freq: Two times a day (BID) | ORAL | Status: DC

## 2016-03-16 NOTE — Progress Notes (Signed)
Pre visit review using our clinic review tool, if applicable. No additional management support is needed unless otherwise documented below in the visit note. 

## 2016-03-16 NOTE — Patient Instructions (Signed)

## 2016-03-16 NOTE — Progress Notes (Signed)
Subjective:   Richard Harding is a 70 y.o. male who presents for Medicare Annual/Subsequent preventive examination.  Pt just requesting to be referred back to ortho Review of Systems:   Review of Systems  Constitutional: Negative for activity change, appetite change and fatigue.  HENT: Negative for hearing loss, congestion, tinnitus and ear discharge.   Eyes: Negative for visual disturbance (see optho q1y -- vision corrected to 20/20 with glasses).  Respiratory: Negative for cough, chest tightness and shortness of breath.   Cardiovascular: Negative for chest pain, palpitations and leg swelling.  Gastrointestinal: Negative for abdominal pain, diarrhea, constipation and abdominal distention.  Genitourinary: Negative for urgency, frequency, decreased urine volume and difficulty urinating.  Musculoskeletal: Negative for back pain, arthralgias and gait problem.  Skin: Negative for color change, pallor and rash.  Neurological: Negative for dizziness, light-headedness, numbness and headaches.  Hematological: Negative for adenopathy. Does not bruise/bleed easily.  Psychiatric/Behavioral: Negative for suicidal ideas, confusion, sleep disturbance, self-injury, dysphoric mood, decreased concentration and agitation.  Pt is able to read and write and can do all ADLs No risk for falling No abuse/ violence in home           Objective:    Vitals: BP 128/80 mmHg  Pulse 69  Temp(Src) 98.9 F (37.2 C) (Oral)  Ht 5\' 8"  (1.727 m)  Wt 206 lb (93.441 kg)  BMI 31.33 kg/m2  SpO2 98%  Body mass index is 31.33 kg/(m^2). BP 128/80 mmHg  Pulse 69  Temp(Src) 98.9 F (37.2 C) (Oral)  Ht 5\' 8"  (1.727 m)  Wt 206 lb (93.441 kg)  BMI 31.33 kg/m2  SpO2 98% General appearance: alert, cooperative, appears stated age and no distress Head: Normocephalic, without obvious abnormality, atraumatic Eyes: conjunctivae/corneas clear. PERRL, EOM's intact. Fundi benign. Ears: cerumen impaction b/l --- wax is  hard Nose: Nares normal. Septum midline. Mucosa normal. No drainage or sinus tenderness. Throat: lips, mucosa, and tongue normal; teeth and gums normal Neck: no adenopathy, no carotid bruit, no JVD, supple, symmetrical, trachea midline and thyroid not enlarged, symmetric, no tenderness/mass/nodules Back: symmetric, no curvature. ROM normal. No CVA tenderness. Lungs: clear to auscultation bilaterally Chest wall: no tenderness Heart: regular rate and rhythm, S1, S2 normal, no murmur, click, rub or gallop Abdomen: soft, non-tender; bowel sounds normal; no masses,  no organomegaly Male genitalia: normal Rectal: normal tone, normal prostate, no masses or tenderness and soft brown guaiac negative stool noted Extremities: extremities normal, atraumatic, no cyanosis or edema Pulses: 2+ and symmetric Skin: Skin color, texture, turgor normal. No rashes or lesions Lymph nodes: Cervical, supraclavicular, and axillary nodes normal. Neurologic: Alert and oriented X 3, normal strength and tone. Normal symmetric reflexes. Normal coordination and gait Psych--no depression, no anxiety Tobacco History  Smoking status  . Never Smoker   Smokeless tobacco  . Never Used     Counseling given: Not Answered   Past Medical History  Diagnosis Date  . Hypertension   . Hyperlipemia   . Chickenpox    Past Surgical History  Procedure Laterality Date  . Neck surgery     No family history on file. History  Sexual Activity  . Sexual Activity: Not on file    Outpatient Encounter Prescriptions as of 03/16/2016  Medication Sig  . Ascorbic Acid (VITAMIN C) 1000 MG tablet Take 2,000 mg by mouth daily.  Marland Kitchen aspirin 325 MG tablet Take 325 mg by mouth daily.  Marland Kitchen b complex vitamins tablet Take 2 tablets by mouth daily.  . Flaxseed,  Linseed, (FLAX SEED OIL PO) Take 3 capsules by mouth daily. 1400 mg each  . GLUCOSA-CHONDR-NA CHONDR-MSM PO Take 2 capsules by mouth daily.  . Misc Natural Products (PROSTATE HEALTH PO)  Take 1 capsule by mouth daily.   . Multiple Vitamin (MULTIVITAMIN) tablet Take 1 tablet by mouth daily.  . niacin 500 MG tablet Take 500 mg by mouth daily.  . Omega-3 Fatty Acids (FISH OIL) 1000 MG CAPS Take 4 capsules by mouth daily.  . [DISCONTINUED] atenolol (TENORMIN) 100 MG tablet 1 1/2 tab po qd  . [DISCONTINUED] hydrochlorothiazide (HYDRODIURIL) 25 MG tablet Take 1 tablet (25 mg total) by mouth daily.  . [DISCONTINUED] omega-3 acid ethyl esters (LOVAZA) 1 g capsule Take 2 capsules (2 g total) by mouth 2 (two) times daily.  Marland Kitchen atenolol (TENORMIN) 100 MG tablet 1 1/2 tab po qd  . hydrochlorothiazide (HYDRODIURIL) 25 MG tablet Take 1 tablet (25 mg total) by mouth daily.  Marland Kitchen omega-3 acid ethyl esters (LOVAZA) 1 g capsule Take 2 capsules (2 g total) by mouth 2 (two) times daily.  . [DISCONTINUED] fenofibrate 160 MG tablet Take 1 tablet (160 mg total) by mouth daily. (Patient not taking: Reported on 03/16/2016)  . [DISCONTINUED] potassium chloride SA (K-DUR,KLOR-CON) 20 MEQ tablet Take 1 tablet (20 mEq total) by mouth daily. (Patient not taking: Reported on 03/16/2016)   No facility-administered encounter medications on file as of 03/16/2016.    Activities of Daily Living In your present state of health, do you have any difficulty performing the following activities: 09/16/2015  Hearing? Y  Vision? N  Difficulty concentrating or making decisions? N  Walking or climbing stairs? N  Dressing or bathing? N  Doing errands, shopping? N    Patient Care Team: Rosalita Chessman, DO as PCP - General (Family Medicine) Rosalita Chessman, DO (Family Medicine)   Assessment:    cpe Exercise Activities and Dietary recommendations-- walking  Current Exercise Habits: Home exercise routine, Type of exercise: walking, Time (Minutes): 30, Frequency (Times/Week): 2, Weekly Exercise (Minutes/Week): 60, Intensity: Mild, Exercise limited by: orthopedic condition(s)  Goals    None     Fall Risk Fall Risk   03/16/2016 09/16/2015 01/18/2014  Falls in the past year? No No No   Depression Screen PHQ 2/9 Scores 03/16/2016 09/16/2015 01/18/2014  PHQ - 2 Score 0 0 1  Exception Documentation - Patient refusal -    Cognitive Testing mmse 30/30  Immunization History  Administered Date(s) Administered  . Influenza, High Dose Seasonal PF 09/16/2015  . Pneumococcal Conjugate-13 09/16/2015  . Pneumococcal Polysaccharide-23 01/18/2014  . Tdap 01/17/2012  . Zoster 02/21/2014   Screening Tests Health Maintenance  Topic Date Due  . INFLUENZA VACCINE  07/21/2016  . COLONOSCOPY  01/17/2020  . TETANUS/TDAP  01/16/2022  . ZOSTAVAX  Completed  . Hepatitis C Screening  Completed  . PNA vac Low Risk Adult  Completed      Plan:    During the course of the visit the patient was educated and counseled about the following appropriate screening and preventive services:   Vaccines to include Pneumoccal, Influenza, Hepatitis B, Td, Zostavax, HCV  Electrocardiogram  Cardiovascular Disease  Colorectal cancer screening  Diabetes screening  Prostate Cancer Screening  Glaucoma screening  Nutrition counseling   Smoking cessation counseling  Patient Instructions (the written plan) was given to the patient.  1. Essential hypertension, benign stable - atenolol (TENORMIN) 100 MG tablet; 1 1/2 tab po qd  Dispense: 135 tablet; Refill: 3 -  hydrochlorothiazide (HYDRODIURIL) 25 MG tablet; Take 1 tablet (25 mg total) by mouth daily.  Dispense: 90 tablet; Refill: 3 - PSA  2. Hyperlipidemia   - omega-3 acid ethyl esters (LOVAZA) 1 g capsule; Take 2 capsules (2 g total) by mouth 2 (two) times daily.  Dispense: 360 capsule; Refill: 3 - Comprehensive metabolic panel - Lipid panel - POCT urinalysis dipstick - PSA  3. Preventative health care   - Comprehensive metabolic panel - CBC with Differential/Platelet - PSA  4. Primary osteoarthritis of both knees Recently started to act up Pt requesting to see  ortho again  - Ambulatory referral to Orthopedic Surgery  5. Blurry vision   - Ambulatory referral to Ophthalmology  6. Routine history and physical examination of adult     Garnet Koyanagi, DO  03/16/2016

## 2016-03-17 DIAGNOSIS — R8299 Other abnormal findings in urine: Secondary | ICD-10-CM | POA: Diagnosis not present

## 2016-03-17 LAB — POCT URINALYSIS DIPSTICK
Bilirubin, UA: NEGATIVE
Blood, UA: NEGATIVE
Glucose, UA: NEGATIVE
Ketones, UA: NEGATIVE
Leukocytes, UA: NEGATIVE
NITRITE UA: POSITIVE
PH UA: 8
Spec Grav, UA: 1.015
UROBILINOGEN UA: 0.2

## 2016-03-17 NOTE — Addendum Note (Signed)
Addended by: Caffie Pinto on: 03/17/2016 10:03 AM   Modules accepted: Orders

## 2016-03-18 ENCOUNTER — Ambulatory Visit (INDEPENDENT_AMBULATORY_CARE_PROVIDER_SITE_OTHER): Payer: Medicare Other | Admitting: Behavioral Health

## 2016-03-18 DIAGNOSIS — H6122 Impacted cerumen, left ear: Secondary | ICD-10-CM | POA: Diagnosis not present

## 2016-03-18 NOTE — Progress Notes (Signed)
Pre visit review using our clinic review tool, if applicable. No additional management support is needed unless otherwise documented below in the visit note.  Patient in office today for ear cleaning. Elyn Aquas, PA-C gave verbal order for ear irrigation. Both ears were checked for wax build-up. Right ear was clean and small amount of wax found in the left ear. 3 cc of peroxide & 10 cc of warm water used to irrigate the ear. Clear return. Patient verbalized little discomfort, but no dizziness. Procedure was tolerated well.

## 2016-03-19 LAB — URINE CULTURE

## 2016-03-20 ENCOUNTER — Encounter: Payer: Self-pay | Admitting: *Deleted

## 2016-03-20 ENCOUNTER — Telehealth: Payer: Self-pay | Admitting: *Deleted

## 2016-03-20 DIAGNOSIS — R972 Elevated prostate specific antigen [PSA]: Secondary | ICD-10-CM

## 2016-03-20 DIAGNOSIS — R739 Hyperglycemia, unspecified: Secondary | ICD-10-CM

## 2016-03-20 MED ORDER — CIPROFLOXACIN HCL 500 MG PO TABS: 500.0000 mg | ORAL_TABLET | Freq: Two times a day (BID) | ORAL | Status: DC

## 2016-03-20 NOTE — Telephone Encounter (Signed)
-----   Message from Ann Held, [place] sent at 03/19/2016  9:10 PM EDT ----- psa also very elevated --- some may be from uti Repeat in 4-6 weeks  If still elevated ---- refer to urology

## 2016-03-20 NOTE — Telephone Encounter (Signed)
Also scheduled lab appt for repeat PSA on 04/24/16. He states he does not wish to see a urologist for elevated PSA as he has seen them in the past and they told him they were not concerned with his elevated PSA. Will complete hgba1c before arranging diabetic teaching. Mailed labs to pt at pt request. Future orders entered.

## 2016-03-20 NOTE — Telephone Encounter (Signed)
Notified pt of all results below. He voices understanding and states that he is not going to take any statin as he tried them before and they affected his memory. States "he will just die with cholesterol before taking medication."  Cipro Rx sent. Lab appts scheduled for hgb a1c as it is too late to add test.   Notes Recorded by Ann Held, DO on 03/19/2016 at 9:10 PM + UTI --- cipro 500 mg bid x 2 weeks Cholesterol--- LDL goal < 70, HDL >40, TG < 150. Diet and exercise will increase HDL and decrease LDL and TG. Fish, Fish Oil, Flaxseed oil will also help increase the HDL and decrease Triglycerides.  Recheck labs in 3 months--- start lipitor 20 mg #30 1 po qhs, 2 refills + diabetic.---- add Hgba1c Pt needs diabetic teaching and glucometer

## 2016-03-23 ENCOUNTER — Other Ambulatory Visit (INDEPENDENT_AMBULATORY_CARE_PROVIDER_SITE_OTHER): Payer: Medicare Other

## 2016-03-23 DIAGNOSIS — R739 Hyperglycemia, unspecified: Secondary | ICD-10-CM | POA: Diagnosis not present

## 2016-03-23 DIAGNOSIS — R972 Elevated prostate specific antigen [PSA]: Secondary | ICD-10-CM

## 2016-03-23 LAB — HEMOGLOBIN A1C: HEMOGLOBIN A1C: 6.8 % — AB (ref 4.6–6.5)

## 2016-03-23 LAB — PSA: PSA: 9.15 ng/mL — ABNORMAL HIGH (ref 0.10–4.00)

## 2016-03-24 DIAGNOSIS — H04123 Dry eye syndrome of bilateral lacrimal glands: Secondary | ICD-10-CM | POA: Diagnosis not present

## 2016-03-24 DIAGNOSIS — H2513 Age-related nuclear cataract, bilateral: Secondary | ICD-10-CM | POA: Diagnosis not present

## 2016-03-24 LAB — HM DIABETES EYE EXAM

## 2016-04-07 ENCOUNTER — Ambulatory Visit (INDEPENDENT_AMBULATORY_CARE_PROVIDER_SITE_OTHER): Payer: Medicare Other | Admitting: Behavioral Health

## 2016-04-07 DIAGNOSIS — E109 Type 1 diabetes mellitus without complications: Secondary | ICD-10-CM | POA: Diagnosis not present

## 2016-04-07 DIAGNOSIS — E119 Type 2 diabetes mellitus without complications: Secondary | ICD-10-CM

## 2016-04-07 MED ORDER — ONETOUCH DELICA LANCETS 33G MISC: Status: DC

## 2016-04-07 MED ORDER — GLUCOSE BLOOD VI STRP: ORAL_STRIP | Status: DC

## 2016-04-07 NOTE — Progress Notes (Addendum)
Pre visit review using our clinic review tool, if applicable. No additional management support is needed unless otherwise documented below in the visit note.  Patient presents in office today for diabetic education per lab note on 03/23/16. During this visit the patient was given the following: a Living Well with Diabetes booklet, Daily Diabetes Meal Planning Guide, 3 handouts discussing carb counting, healthy portions and moving forward with diabetes and a One Touch Verio Flex Glucometer with test strips and lancets. Demonstration provided on how to use the glucometer with teach back. Also, advised patient to select foods that are considered high fiber carbohydrates, as opposed to eating refined carbohydrates like rice, white bread, sodas and etc. Patient voiced that he typically eats a lot of white rice on a daily basis, but going forward he will try to substitute it with brown rice instead and cut down on the ice cream and sodas he eats & drinks as well. Prior to the end of visit, the patient did not have any further questions or concerns.  Rx sent to the patient's preferred pharmacy for additional test strips and lancets.    Reviewed------ Samara Deist- chase

## 2016-04-10 DIAGNOSIS — E119 Type 2 diabetes mellitus without complications: Secondary | ICD-10-CM | POA: Diagnosis not present

## 2016-04-10 DIAGNOSIS — E118 Type 2 diabetes mellitus with unspecified complications: Secondary | ICD-10-CM | POA: Diagnosis not present

## 2016-04-24 ENCOUNTER — Other Ambulatory Visit: Payer: Self-pay

## 2016-05-14 DIAGNOSIS — M1712 Unilateral primary osteoarthritis, left knee: Secondary | ICD-10-CM | POA: Diagnosis not present

## 2016-05-14 DIAGNOSIS — M1711 Unilateral primary osteoarthritis, right knee: Secondary | ICD-10-CM | POA: Diagnosis not present

## 2016-05-14 DIAGNOSIS — M170 Bilateral primary osteoarthritis of knee: Secondary | ICD-10-CM | POA: Diagnosis not present

## 2016-05-19 ENCOUNTER — Encounter: Payer: Self-pay | Admitting: Family Medicine

## 2016-05-26 ENCOUNTER — Telehealth: Payer: Self-pay | Admitting: Family Medicine

## 2016-05-26 NOTE — Telephone Encounter (Signed)
Please advise on a potential substitute for the Lovaza.     KP

## 2016-05-26 NOTE — Telephone Encounter (Signed)
OTC fish oil would be the only thing close

## 2016-05-26 NOTE — Telephone Encounter (Signed)
The pharmacist is aware and verbalized understanding, they will provide OTC fish oil. Same directions.    KP

## 2016-05-26 NOTE — Telephone Encounter (Signed)
Caller name: Remo Lipps with [facility] Can be reached: [phone] Pharmacy:  Reason for call: pts Lovaza is coming back as needing MD approval for formulary exception (PA). Remo Lipps will fax the PA form and wanting to know if substitute is preferred.

## 2016-05-28 ENCOUNTER — Telehealth: Payer: Self-pay | Admitting: Family Medicine

## 2016-05-28 ENCOUNTER — Telehealth: Payer: Self-pay | Admitting: *Deleted

## 2016-05-28 DIAGNOSIS — E119 Type 2 diabetes mellitus without complications: Secondary | ICD-10-CM

## 2016-05-28 MED ORDER — GLUCOSE BLOOD VI STRP: ORAL_STRIP | Status: DC

## 2016-05-28 MED ORDER — FISH OIL 1200 MG PO CAPS: 2.0000 | ORAL_CAPSULE | Freq: Two times a day (BID) | ORAL | Status: DC

## 2016-05-28 NOTE — Telephone Encounter (Signed)
His ins will probalbly only leg him test 1x a day Fish oil 1000 u  2 po bid #120

## 2016-05-28 NOTE — Telephone Encounter (Signed)
Please advise      KP 

## 2016-05-28 NOTE — Telephone Encounter (Signed)
°  Relationship to patient: Self  Can be reached: [phone]   Pharmacy:  [facility], [place]. [phone] (Phone) [phone] (Fax)       Reason for call: Request a rx for a generic fish oil that insurance will pay for. Also, his pharmacy will not refill his test strips and he only got 50 with the rx. He has stop testing his BS because they did not refill his strips.

## 2016-05-28 NOTE — Telephone Encounter (Signed)
Both medications have been sent.     KP

## 2016-05-29 NOTE — Telephone Encounter (Signed)
Insurance on file is coming back non active. Called pt, no answer. Faxed pharmacy to please send updated insurance info to proceed with PA process. [id number]

## 2016-05-30 DIAGNOSIS — E119 Type 2 diabetes mellitus without complications: Secondary | ICD-10-CM | POA: Diagnosis not present

## 2016-07-26 DIAGNOSIS — E119 Type 2 diabetes mellitus without complications: Secondary | ICD-10-CM | POA: Diagnosis not present

## 2016-08-21 ENCOUNTER — Telehealth: Payer: Self-pay

## 2016-08-21 MED ORDER — METOPROLOL TARTRATE 25 MG PO TABS: 25.0000 mg | ORAL_TABLET | Freq: Two times a day (BID) | ORAL | 0 refills | Status: DC

## 2016-08-21 NOTE — Telephone Encounter (Signed)
Message left for the patient to call the office    KP

## 2016-08-21 NOTE — Telephone Encounter (Signed)
Takes atenolol 100 mg 1.5 tablet daily. Change to metoprolol 50 mg twice a day. Watch BP and side effects. If problems call the office

## 2016-08-21 NOTE — Telephone Encounter (Signed)
Atenolol is on back order and the pharmacy is requesting to switch to something else. Please advise    KP

## 2016-08-25 MED ORDER — METOPROLOL TARTRATE 50 MG PO TABS: 50.0000 mg | ORAL_TABLET | Freq: Two times a day (BID) | ORAL | 0 refills | Status: DC

## 2016-08-25 NOTE — Telephone Encounter (Signed)
The patient has been made aware and verbalized understanding, Rx for 25 mg was sent in error and I called the pharmacy to cancel. He will pick up his med's on 9/6.  KP

## 2016-12-03 ENCOUNTER — Other Ambulatory Visit: Payer: Self-pay | Admitting: Family Medicine

## 2016-12-03 ENCOUNTER — Telehealth: Payer: Self-pay | Admitting: Family Medicine

## 2016-12-03 NOTE — Telephone Encounter (Signed)
Patient is calling with questions regarding his Bp medications. Please advise  Patient phone: [phone]

## 2016-12-03 NOTE — Telephone Encounter (Signed)
Left message on pt's vm to the office a call back, in regards to questions he had on b/p medication. LB

## 2016-12-07 MED ORDER — CHLORTHALIDONE 25 MG PO TABS: 25.0000 mg | ORAL_TABLET | Freq: Every day | ORAL | 0 refills | Status: DC

## 2016-12-07 NOTE — Telephone Encounter (Signed)
Spoke with pt, pt states he was originally on Atenolol with HTC and then the pharmacy no longer carried Atenolol, so provider put pt on Metoprolol and to continue HTC. Pt states he stopped taking HTC 2 weeks ago because it caused his blood sugar to go up. Advised pt he should not stop taking a medication unless instructed by provider. I asked pt if he kept track of his blood pressure while he was off HTC, pt states he has not kept track but his Nurse friend is coming by tomorrow to check blood pressure. Pt would like to know if there is another medication he can take in place of [facility] and pt states whatever the Rx is it has to be the generic brand. Please Advise. LB

## 2016-12-07 NOTE — Telephone Encounter (Signed)
Ok to remain off of hctz. Instead begin chlorthalidone. Continue metoprolol. Follow up with provider in 1-2 weeks.

## 2016-12-07 NOTE — Telephone Encounter (Signed)
Patient is calling back regarding his BP medications. He would like to know if he needs to get another prescription to replace the hydrochlorothiazide (HYDRODIURIL) 25 MG tablet  Since he has stopped taking it? If so, he needs something generic. Please advise

## 2016-12-07 NOTE — Telephone Encounter (Signed)
Spoke with pt, pt states he was originally on Atenolol with HTC and then the pharmacy no longer carried Atenolol, so provider put pt on Metoprolol and to continue HTC. Pt states he stopped taking HTC 2 weeks ago because it caused his blood sugar to go up. Advised pt he should not stop taking a medication unless instructed by provider. I asked pt if he kept track of his blood pressure while he was off HTC, pt states he has not kept track but his Nurse friend is coming by tomorrow to check blood pressure. Pt would like to know if there is another medication he can take in place of the [facility] and pt states whatever the Rx is it has to be the generic brand. Please Advise. LB

## 2016-12-07 NOTE — Addendum Note (Signed)
Addended by: Debbrah Alar on: 12/07/2016 04:34 PM   Modules accepted: Orders

## 2016-12-07 NOTE — Telephone Encounter (Signed)
Notified pt and he voices understanding. Appointment scheduled with Dr Carollee Herter for 12/24/16 at 11am.

## 2016-12-24 ENCOUNTER — Ambulatory Visit (INDEPENDENT_AMBULATORY_CARE_PROVIDER_SITE_OTHER): Payer: Medicare Other | Admitting: Family Medicine

## 2016-12-24 ENCOUNTER — Encounter: Payer: Self-pay | Admitting: Family Medicine

## 2016-12-24 VITALS — BP 113/74 | HR 85 | Temp 98.5°F | Resp 16 | Ht 68.0 in | Wt 200.8 lb

## 2016-12-24 DIAGNOSIS — I10 Essential (primary) hypertension: Secondary | ICD-10-CM

## 2016-12-24 DIAGNOSIS — E785 Hyperlipidemia, unspecified: Secondary | ICD-10-CM

## 2016-12-24 DIAGNOSIS — J0140 Acute pansinusitis, unspecified: Secondary | ICD-10-CM

## 2016-12-24 LAB — COMPREHENSIVE METABOLIC PANEL
ALBUMIN: 4.2 g/dL (ref 3.5–5.2)
ALT: 19 U/L (ref 0–53)
AST: 15 U/L (ref 0–37)
Alkaline Phosphatase: 53 U/L (ref 39–117)
BUN: 21 mg/dL (ref 6–23)
CO2: 33 mEq/L — ABNORMAL HIGH (ref 19–32)
Calcium: 9.4 mg/dL (ref 8.4–10.5)
Chloride: 97 mEq/L (ref 96–112)
Creatinine, Ser: 0.9 mg/dL (ref 0.40–1.50)
GFR: 88.79 mL/min (ref >60.00)
Glucose, Bld: 203 mg/dL — ABNORMAL HIGH (ref 70–99)
POTASSIUM: 2.9 meq/L — AB (ref 3.5–5.1)
Sodium: 137 mEq/L (ref 135–145)
Total Bilirubin: 0.5 mg/dL (ref 0.2–1.2)
Total Protein: 7.4 g/dL (ref 6.0–8.3)

## 2016-12-24 LAB — LIPID PANEL
CHOL/HDL RATIO: 5
CHOLESTEROL: 181 mg/dL (ref 0–200)
HDL: 33.2 mg/dL — ABNORMAL LOW (ref >39.00)
NonHDL: 147.64
Triglycerides: 305 mg/dL — ABNORMAL HIGH (ref 0.0–149.0)
VLDL: 61 mg/dL — ABNORMAL HIGH (ref 0.0–40.0)

## 2016-12-24 LAB — LDL CHOLESTEROL, DIRECT: Direct LDL: 120 mg/dL

## 2016-12-24 MED ORDER — METOPROLOL TARTRATE 50 MG PO TABS: 50.0000 mg | ORAL_TABLET | Freq: Two times a day (BID) | ORAL | 0 refills | Status: DC

## 2016-12-24 MED ORDER — CHLORTHALIDONE 25 MG PO TABS: 25.0000 mg | ORAL_TABLET | Freq: Every day | ORAL | 0 refills | Status: DC

## 2016-12-24 MED ORDER — CHLORTHALIDONE 25 MG PO TABS: 25.0000 mg | ORAL_TABLET | Freq: Every day | ORAL | 3 refills | Status: DC

## 2016-12-24 MED ORDER — AZITHROMYCIN 250 MG PO TABS: ORAL_TABLET | ORAL | 0 refills | Status: DC

## 2016-12-24 MED ORDER — METOPROLOL TARTRATE 50 MG PO TABS: 50.0000 mg | ORAL_TABLET | Freq: Two times a day (BID) | ORAL | 1 refills | Status: DC

## 2016-12-24 MED ORDER — FLUTICASONE PROPIONATE 50 MCG/ACT NA SUSP: 2.0000 | Freq: Every day | NASAL | 6 refills | Status: DC

## 2016-12-24 MED ORDER — LEVOCETIRIZINE DIHYDROCHLORIDE 5 MG PO TABS: 5.0000 mg | ORAL_TABLET | Freq: Every evening | ORAL | 5 refills | Status: DC

## 2016-12-24 MED ORDER — FISH OIL 1200 MG PO CAPS: 2.0000 | ORAL_CAPSULE | Freq: Two times a day (BID) | ORAL | 3 refills | Status: DC

## 2016-12-24 NOTE — Patient Instructions (Signed)

## 2016-12-24 NOTE — Progress Notes (Signed)
Pre visit review using our clinic review tool, if applicable. No additional management support is needed unless otherwise documented below in the visit note. 

## 2016-12-24 NOTE — Progress Notes (Signed)
Patient ID: Richard Harding, male    DOB: Jun 09, 1946  Age: 71 y.o. MRN: [id number]    Subjective:  Subjective  HPI ALVY TRAMMEL presents for f/u bp and cholesterol   He also c/o congestion and cough x 3 weeks   Review of Systems  Constitutional: Negative for appetite change, diaphoresis, fatigue and unexpected weight change.  Eyes: Negative for pain, redness and visual disturbance.  Respiratory: Negative for cough, chest tightness, shortness of breath and wheezing.   Cardiovascular: Negative for chest pain, palpitations and leg swelling.  Endocrine: Negative for cold intolerance, heat intolerance, polydipsia, polyphagia and polyuria.  Genitourinary: Negative for difficulty urinating, dysuria and frequency.  Neurological: Negative for dizziness, light-headedness, numbness and headaches.    History Past Medical History:  Diagnosis Date  . Chickenpox   . Hyperlipemia   . Hypertension     He has a past surgical history that includes Neck surgery.   His family history is not on file.He reports that he has never smoked. He has never used smokeless tobacco. He reports that he does not drink alcohol or use drugs.  Current Outpatient Prescriptions on File Prior to Visit  Medication Sig Dispense Refill  . Ascorbic Acid (VITAMIN C) 1000 MG tablet Take 2,000 mg by mouth daily.    Marland Kitchen aspirin 325 MG tablet Take 325 mg by mouth daily.    Marland Kitchen b complex vitamins tablet Take 2 tablets by mouth daily.    . Flaxseed, Linseed, (FLAX SEED OIL PO) Take 3 capsules by mouth daily. 1400 mg each    . GLUCOSA-CHONDR-NA CHONDR-MSM PO Take 2 capsules by mouth daily.    Marland Kitchen glucose blood (ONETOUCH VERIO) test strip Check blood sugar once daily. Dx: E11.9 50 each 5  . Misc Natural Products (PROSTATE HEALTH PO) Take 1 capsule by mouth daily.     . Multiple Vitamin (MULTIVITAMIN) tablet Take 1 tablet by mouth daily.    . niacin 500 MG tablet Take 500 mg by mouth daily.    Marland Kitchen omega-3 acid ethyl esters (LOVAZA) 1  g capsule Take 2 capsules (2 g total) by mouth 2 (two) times daily. 360 capsule 3  . ONETOUCH DELICA LANCETS 99991111 MISC Check blood sugar once daily. Dx: E11.9 50 each 5   No current facility-administered medications on file prior to visit.      Objective:  Objective  Physical Exam  Constitutional: He is oriented to person, place, and time. Vital signs are normal. He appears well-developed and well-nourished. He is sleeping.  HENT:  Head: Normocephalic and atraumatic.  Right Ear: External ear normal.  Left Ear: External ear normal.  Nose: Right sinus exhibits maxillary sinus tenderness and frontal sinus tenderness. Left sinus exhibits maxillary sinus tenderness and frontal sinus tenderness.  Mouth/Throat: Oropharynx is clear and moist.  + PND + errythema  Eyes: Conjunctivae and EOM are normal. Pupils are equal, round, and reactive to light. Right eye exhibits no discharge. Left eye exhibits no discharge.  Neck: Normal range of motion. Neck supple. No thyromegaly present.  Cardiovascular: Normal rate, regular rhythm and normal heart sounds.   No murmur heard. Pulmonary/Chest: Effort normal and breath sounds normal. No respiratory distress. He has no wheezes. He has no rales. He exhibits no tenderness.  Musculoskeletal: He exhibits no edema or tenderness.  Lymphadenopathy:    He has cervical adenopathy.  Neurological: He is alert and oriented to person, place, and time.  Skin: Skin is warm and dry.  Psychiatric: He has a normal  mood and affect. His behavior is normal. Judgment and thought content normal.  Nursing note and vitals reviewed.  BP 113/74 (BP Location: Right Arm, Patient Position: Sitting, Cuff Size: Large)   Pulse 85   Temp 98.5 F (36.9 C) (Oral)   Resp 16   Ht 5\' 8"  (1.727 m)   Wt 200 lb 12.8 oz (91.1 kg)   SpO2 96%   BMI 30.53 kg/m  Wt Readings from Last 3 Encounters:  12/24/16 200 lb 12.8 oz (91.1 kg)  03/16/16 206 lb (93.4 kg)  09/16/15 203 lb 3.2 oz (92.2 kg)      Lab Results  Component Value Date   WBC 8.0 03/16/2016   HGB 15.4 03/16/2016   HCT 45.2 03/16/2016   PLT 262.0 03/16/2016   GLUCOSE 203 (H) 12/24/2016   CHOL 181 12/24/2016   TRIG 305.0 (H) 12/24/2016   HDL 33.20 (L) 12/24/2016   LDLDIRECT 120.0 12/24/2016   ALT 19 12/24/2016   AST 15 12/24/2016   NA 137 12/24/2016   K 2.9 (L) 12/24/2016   CL 97 12/24/2016   CREATININE 0.90 12/24/2016   BUN 21 12/24/2016   CO2 33 (H) 12/24/2016   PSA 9.15 (H) 03/23/2016   HGBA1C 6.8 (H) 03/23/2016    US Soft Tissue Head/neck  Result Date: 09/05/2014 CLINICAL DATA:  Thyromegaly EXAM: THYROID ULTRASOUND TECHNIQUE: Ultrasound examination of the thyroid gland and adjacent soft tissues was performed. COMPARISON:  None. FINDINGS: Right thyroid lobe Measurements: 4.7 x 1.9 x 1.7 cm. 4 mm hypoechoic anterior superior nodule. Left thyroid lobe Measurements: 4.4 x 1.6 x 1.4 cm. Heterogeneous gland without nodule. Isthmus Thickness: 3 mm.  No nodules visualized. Lymphadenopathy None visualized. IMPRESSION: 4 mm right upper pole nodule. Heterogeneous glandular tissue. Findings do not meet current SRU consensus criteria for biopsy. Follow-up by clinical exam is recommended. If patient has known risk factors for thyroid carcinoma, consider follow-up ultrasound in 12 months. If patient is clinically hyperthyroid, consider nuclear medicine thyroid uptake and scan.Reference: Management of Thyroid Nodules Detected at [place]: Society of Radiologists in [place]. Radiology 2005; [id number]. Electronically Signed   By: Maryclare Bean M.D.   On: 09/05/2014 16:35     Assessment & Plan:  Plan  I have discontinued Mr. Niebel atenolol and ciprofloxacin. I am also having him start on azithromycin, fluticasone, and levocetirizine. Additionally, I am having him maintain his aspirin, vitamin C, (Flaxseed, Linseed, (FLAX SEED OIL PO)), Misc Natural Products (PROSTATE HEALTH PO), b complex vitamins,  niacin, multivitamin, GLUCOSA-CHONDR-NA CHONDR-MSM PO, omega-3 acid ethyl esters, ONETOUCH DELICA LANCETS [id number], glucose blood, chlorthalidone, Fish Oil, and metoprolol.  Meds ordered this encounter  Medications  . DISCONTD: metoprolol (LOPRESSOR) 50 MG tablet    Sig: Take 1 tablet (50 mg total) by mouth 2 (two) times daily.    Dispense:  180 tablet    Refill:  0  . DISCONTD: chlorthalidone (HYGROTON) 25 MG tablet    Sig: Take 1 tablet (25 mg total) by mouth daily.    Dispense:  30 tablet    Refill:  0  . chlorthalidone (HYGROTON) 25 MG tablet    Sig: Take 1 tablet (25 mg total) by mouth daily.    Dispense:  90 tablet    Refill:  3  . Omega-3 Fatty Acids (FISH OIL) 1200 MG CAPS    Sig: Take 2 capsules (2,400 mg total) by mouth 2 (two) times daily.    Dispense:  360 capsule    Refill:  3  .  metoprolol (LOPRESSOR) 50 MG tablet    Sig: Take 1 tablet (50 mg total) by mouth 2 (two) times daily.    Dispense:  180 tablet    Refill:  1  . azithromycin (ZITHROMAX Z-PAK) 250 MG tablet    Sig: As directed    Dispense:  6 each    Refill:  0  . fluticasone (FLONASE) 50 MCG/ACT nasal spray    Sig: Place 2 sprays into both nostrils daily.    Dispense:  16 g    Refill:  6  . levocetirizine (XYZAL) 5 MG tablet    Sig: Take 1 tablet (5 mg total) by mouth every evening.    Dispense:  30 tablet    Refill:  5    Problem List Items Addressed This Visit      Unprioritized   HTN (hypertension) - Primary   Relevant Medications   chlorthalidone (HYGROTON) 25 MG tablet   metoprolol (LOPRESSOR) 50 MG tablet   Other Relevant Orders   Comprehensive metabolic panel (Completed)    Other Visit Diagnoses    Hyperlipidemia, unspecified hyperlipidemia type       Relevant Medications   chlorthalidone (HYGROTON) 25 MG tablet   Omega-3 Fatty Acids (FISH OIL) 1200 MG CAPS   metoprolol (LOPRESSOR) 50 MG tablet   Other Relevant Orders   Lipid panel (Completed)   Acute pansinusitis, recurrence not  specified       Relevant Medications   azithromycin (ZITHROMAX Z-PAK) 250 MG tablet   fluticasone (FLONASE) 50 MCG/ACT nasal spray   levocetirizine (XYZAL) 5 MG tablet      Follow-up: Return in about 6 months (around 06/23/2017) for annual exam, fasting.  Ann Held, DO

## 2017-01-08 ENCOUNTER — Other Ambulatory Visit: Payer: Self-pay | Admitting: Family Medicine

## 2017-01-08 DIAGNOSIS — I10 Essential (primary) hypertension: Secondary | ICD-10-CM

## 2017-01-08 MED ORDER — CHLORTHALIDONE 25 MG PO TABS: 25.0000 mg | ORAL_TABLET | Freq: Every day | ORAL | 3 refills | Status: DC

## 2017-01-08 NOTE — Telephone Encounter (Signed)
Patient is requesting a refill of chlorthalidone (HYGROTON) 25 MG tablet Patient states he is completely out of this medication. Patient would like a 90 day supply but enough refills for the year. Patient also requests a call once this medication was sent in. Please advise  Pharmacy: [facility], [place].

## 2017-01-08 NOTE — Telephone Encounter (Signed)
Patient called in stating he is out of BP meds and is snowed in. He stated his has someone coming to pick him up to take him out at 1:30 asking could we send refill.

## 2017-01-08 NOTE — Telephone Encounter (Signed)
Refill done as requested 

## 2017-03-09 ENCOUNTER — Telehealth: Payer: Self-pay | Admitting: Family Medicine

## 2017-03-09 NOTE — Telephone Encounter (Signed)
Caller name: Mohmoud Demonbreun Relationship to patient: self Can be reached: [phone] Pharmacy: [facility], [place].  Reason for call: Pt wants lab results from January mailed to him. [place] [address]  Pt also needing RX sent to pharmacy for Omega-3 acid ethyl esters (generic for lovaza) - send for 6 months or 1 year in 90 day supply. Pt said OTC is not for him. Pt takes 4/day. Takes 120 per month (360ud for 90 day supply).

## 2017-03-10 MED ORDER — OMEGA-3-ACID ETHYL ESTERS 1 G PO CAPS: 2.0000 g | ORAL_CAPSULE | Freq: Two times a day (BID) | ORAL | 3 refills | Status: DC

## 2017-03-10 NOTE — Telephone Encounter (Signed)
Results mailed to pt. Spoke with pt re: omega Rx and he prefers Lovaza and not otc brand. Refill sent for lovaza.

## 2017-03-30 DIAGNOSIS — E119 Type 2 diabetes mellitus without complications: Secondary | ICD-10-CM | POA: Diagnosis not present

## 2017-03-30 DIAGNOSIS — H04123 Dry eye syndrome of bilateral lacrimal glands: Secondary | ICD-10-CM | POA: Diagnosis not present

## 2017-03-30 DIAGNOSIS — H2513 Age-related nuclear cataract, bilateral: Secondary | ICD-10-CM | POA: Diagnosis not present

## 2017-03-30 DIAGNOSIS — H10413 Chronic giant papillary conjunctivitis, bilateral: Secondary | ICD-10-CM | POA: Diagnosis not present

## 2017-04-08 ENCOUNTER — Other Ambulatory Visit: Payer: Self-pay | Admitting: Family Medicine

## 2017-04-09 ENCOUNTER — Other Ambulatory Visit: Payer: Self-pay

## 2017-04-16 ENCOUNTER — Other Ambulatory Visit: Payer: Self-pay | Admitting: Family Medicine

## 2017-04-19 ENCOUNTER — Other Ambulatory Visit: Payer: Self-pay | Admitting: Family Medicine

## 2017-04-19 MED ORDER — OMEGA-3-ACID ETHYL ESTERS 1 G PO CAPS: 2.0000 | ORAL_CAPSULE | Freq: Two times a day (BID) | ORAL | 0 refills | Status: DC

## 2017-05-19 DIAGNOSIS — M48062 Spinal stenosis, lumbar region with neurogenic claudication: Secondary | ICD-10-CM | POA: Diagnosis not present

## 2017-05-19 DIAGNOSIS — M170 Bilateral primary osteoarthritis of knee: Secondary | ICD-10-CM | POA: Diagnosis not present

## 2017-05-19 DIAGNOSIS — M5431 Sciatica, right side: Secondary | ICD-10-CM | POA: Diagnosis not present

## 2017-05-19 DIAGNOSIS — M545 Low back pain: Secondary | ICD-10-CM | POA: Diagnosis not present

## 2017-06-03 ENCOUNTER — Telehealth: Payer: Self-pay | Admitting: Family Medicine

## 2017-06-03 MED ORDER — OMEGA-3-ACID ETHYL ESTERS 1 G PO CAPS: 2.0000 | ORAL_CAPSULE | Freq: Two times a day (BID) | ORAL | 0 refills | Status: DC

## 2017-06-03 NOTE — Telephone Encounter (Signed)
Patient states due to receiving multiple bills for lab he would like to only have labs conducted yearly and will call back to schedule appointment with PCP, informed patient to contact insurance to find out what labs/locations are covered and patient states he can never receive a direct answer from insurance. Please advise

## 2017-06-03 NOTE — Telephone Encounter (Signed)
90 day supply sent. Pt due for 6 month follow-up w/ PCP. No further refills w/o appt. Please schedule. Thank you.

## 2017-06-03 NOTE — Telephone Encounter (Signed)
Relation to pt: self  Call back number:[phone] Pharmacy: [facility], [place]. [phone] (Phone) [phone] (Fax)     Reason for call:  Requesting 3 month supply omega-3 acid ethyl esters (LOVAZA) 1 g capsule generic

## 2017-06-04 NOTE — Telephone Encounter (Signed)
Pt is diabetic and has high cholesterol--- he needs to be checked more frequently that 1x a year  He should have had recheck in April---- Richard Harding--- can someone help him figure out what the problem is with his lab?

## 2017-06-04 NOTE — Telephone Encounter (Signed)
I called the patient to get additional information about his lab bills and he said he no longer has the bills. He said they came from different labs and he did not have the information but he paid them. He said one of the bills was $41 and he cannot afford to have treatment like that. He said he is asking to limit the labs to once per year and worse than that could happen is he would die and he is ok with that. He said be cannot afford to have the labs but once a year but would be able to content to see you for visits as his insurance will cover that. I did explain to the patient that in or to keep getting the meds you would probably have to monitor his levels but I would relay the message. I told him if he could locate the bills to give me a call back so I can look into them.I do not see any bills on his account at this time.

## 2017-09-08 ENCOUNTER — Other Ambulatory Visit: Payer: Self-pay | Admitting: Family Medicine

## 2017-09-08 DIAGNOSIS — I10 Essential (primary) hypertension: Secondary | ICD-10-CM

## 2017-09-08 MED ORDER — METOPROLOL TARTRATE 50 MG PO TABS: 50.0000 mg | ORAL_TABLET | Freq: Two times a day (BID) | ORAL | 0 refills | Status: DC

## 2017-09-08 NOTE — Telephone Encounter (Addendum)
Out of BP med Metoprolol needs a few pills to hold him over until appt with Carollee Herter Tuesday 09/14/17. Pt uses [facility] on [address]. Pt ph [phone].  [place]ne is not here today so routing to DOD asst.

## 2017-09-08 NOTE — Telephone Encounter (Signed)
30 day supply sent to [facility].

## 2017-09-14 ENCOUNTER — Encounter: Payer: Self-pay | Admitting: Family Medicine

## 2017-09-14 ENCOUNTER — Ambulatory Visit (INDEPENDENT_AMBULATORY_CARE_PROVIDER_SITE_OTHER): Payer: Medicare Other | Admitting: Family Medicine

## 2017-09-14 VITALS — BP 118/76 | HR 77 | Resp 20 | Ht 68.0 in | Wt 194.0 lb

## 2017-09-14 DIAGNOSIS — Z23 Encounter for immunization: Secondary | ICD-10-CM

## 2017-09-14 DIAGNOSIS — E785 Hyperlipidemia, unspecified: Secondary | ICD-10-CM | POA: Diagnosis not present

## 2017-09-14 DIAGNOSIS — H6120 Impacted cerumen, unspecified ear: Secondary | ICD-10-CM

## 2017-09-14 DIAGNOSIS — E1151 Type 2 diabetes mellitus with diabetic peripheral angiopathy without gangrene: Secondary | ICD-10-CM | POA: Diagnosis not present

## 2017-09-14 DIAGNOSIS — I10 Essential (primary) hypertension: Secondary | ICD-10-CM

## 2017-09-14 MED ORDER — OMEGA-3-ACID ETHYL ESTERS 1 G PO CAPS: 2.0000 | ORAL_CAPSULE | Freq: Two times a day (BID) | ORAL | 0 refills | Status: DC

## 2017-09-14 MED ORDER — CHLORTHALIDONE 25 MG PO TABS: 25.0000 mg | ORAL_TABLET | Freq: Every day | ORAL | 3 refills | Status: DC

## 2017-09-14 MED ORDER — RED YEAST RICE 600 MG PO CAPS: 1.0000 | ORAL_CAPSULE | Freq: Every day | ORAL | Status: DC

## 2017-09-14 NOTE — Progress Notes (Signed)
Patient ID: Richard Harding, male    DOB: 19-May-1946  Age: 71 y.o. MRN: [id number]    Subjective:  Subjective  HPI Richard Harding presents for f/u dm , bp and cholesterol   Review of Systems  Constitutional: Negative.  Negative for appetite change, diaphoresis, fatigue and unexpected weight change.  HENT: Negative for congestion, ear pain, hearing loss, nosebleeds, postnasal drip, rhinorrhea, sinus pressure, sneezing and tinnitus.   Eyes: Negative for photophobia, pain, discharge, redness, itching and visual disturbance.  Respiratory: Negative.  Negative for cough, chest tightness, shortness of breath and wheezing.   Cardiovascular: Negative.  Negative for chest pain, palpitations and leg swelling.  Gastrointestinal: Negative for abdominal distention, abdominal pain, anal bleeding, blood in stool and constipation.  Endocrine: Negative.  Negative for cold intolerance, heat intolerance, polydipsia, polyphagia and polyuria.  Genitourinary: Negative.  Negative for difficulty urinating, dysuria and frequency.  Musculoskeletal: Negative.   Skin: Negative.   Allergic/Immunologic: Negative.   Neurological: Negative for dizziness, weakness, light-headedness, numbness and headaches.  Psychiatric/Behavioral: Negative for agitation, confusion, decreased concentration, dysphoric mood, sleep disturbance and suicidal ideas. The patient is not nervous/anxious.     History Past Medical History:  Diagnosis Date  . Chickenpox   . Hyperlipemia   . Hypertension     He has a past surgical history that includes Neck surgery.   His family history is not on file.He reports that he has never smoked. He has never used smokeless tobacco. He reports that he does not drink alcohol or use drugs.  Current Outpatient Prescriptions on File Prior to Visit  Medication Sig Dispense Refill  . Ascorbic Acid (VITAMIN C) 1000 MG tablet Take 2,000 mg by mouth daily.    Marland Kitchen b complex vitamins tablet Take 2 tablets by  mouth daily.    . Flaxseed, Linseed, (FLAX SEED OIL PO) Take 3 capsules by mouth daily. 1400 mg each    . GLUCOSA-CHONDR-NA CHONDR-MSM PO Take 2 capsules by mouth daily.    Marland Kitchen glucose blood (ONETOUCH VERIO) test strip Check blood sugar once daily. Dx: E11.9 50 each 5  . metoprolol tartrate (LOPRESSOR) 50 MG tablet Take 1 tablet (50 mg total) by mouth 2 (two) times daily. 60 tablet 0  . Misc Natural Products (PROSTATE HEALTH PO) Take 1 capsule by mouth daily.     . Multiple Vitamin (MULTIVITAMIN) tablet Take 1 tablet by mouth daily.    . niacin 500 MG tablet Take 500 mg by mouth daily.    Glory Rosebush DELICA LANCETS 20U MISC Check blood sugar once daily. Dx: E11.9 50 each 5   No current facility-administered medications on file prior to visit.      Objective:  Objective  Physical Exam  Constitutional: He is oriented to person, place, and time. Vital signs are normal. He appears well-developed and well-nourished. He is sleeping.  HENT:  Head: Normocephalic and atraumatic.  Mouth/Throat: Oropharynx is clear and moist.  Eyes: Pupils are equal, round, and reactive to light. EOM are normal.  Neck: Normal range of motion. Neck supple. No thyromegaly present.  Cardiovascular: Normal rate and regular rhythm.   No murmur heard. Pulmonary/Chest: Effort normal and breath sounds normal. No respiratory distress. He has no wheezes. He has no rales. He exhibits no tenderness.  Musculoskeletal: He exhibits no edema or tenderness.  Neurological: He is alert and oriented to person, place, and time.  Skin: Skin is warm and dry.  Psychiatric: He has a normal mood and affect. His behavior is normal.  Judgment and thought content normal.  Nursing note and vitals reviewed. Sensory exam of the foot is normal, tested with the monofilament. Good pulses, no lesions or ulcers, good peripheral pulses. BP 118/76   Pulse 77   Resp 20   Ht 5\' 8"  (1.727 m)   Wt 194 lb (88 kg)   SpO2 96%   BMI 29.50 kg/m  Wt  Readings from Last 3 Encounters:  09/14/17 194 lb (88 kg)  12/24/16 200 lb 12.8 oz (91.1 kg)  03/16/16 206 lb (93.4 kg)     Lab Results  Component Value Date   WBC 8.0 03/16/2016   HGB 15.4 03/16/2016   HCT 45.2 03/16/2016   PLT 262.0 03/16/2016   GLUCOSE 102 (H) 09/14/2017   CHOL 213 (H) 09/14/2017   TRIG 288.0 (H) 09/14/2017   HDL 32.20 (L) 09/14/2017   LDLDIRECT 126.0 09/14/2017   ALT 24 09/14/2017   AST 18 09/14/2017   NA 139 09/14/2017   K 3.4 (L) 09/14/2017   CL 97 09/14/2017   CREATININE 0.83 09/14/2017   BUN 14 09/14/2017   CO2 34 (H) 09/14/2017   PSA 9.15 (H) 03/23/2016   HGBA1C 6.6 (H) 09/14/2017   MICROALBUR 0.9 09/14/2017    US Soft Tissue Head/neck  Result Date: 09/05/2014 CLINICAL DATA:  Thyromegaly EXAM: THYROID ULTRASOUND TECHNIQUE: Ultrasound examination of the thyroid gland and adjacent soft tissues was performed. COMPARISON:  None. FINDINGS: Right thyroid lobe Measurements: 4.7 x 1.9 x 1.7 cm. 4 mm hypoechoic anterior superior nodule. Left thyroid lobe Measurements: 4.4 x 1.6 x 1.4 cm. Heterogeneous gland without nodule. Isthmus Thickness: 3 mm.  No nodules visualized. Lymphadenopathy None visualized. IMPRESSION: 4 mm right upper pole nodule. Heterogeneous glandular tissue. Findings do not meet current SRU consensus criteria for biopsy. Follow-up by clinical exam is recommended. If patient has known risk factors for thyroid carcinoma, consider follow-up ultrasound in 12 months. If patient is clinically hyperthyroid, consider nuclear medicine thyroid uptake and scan.Reference: Management of Thyroid Nodules Detected at [place]: Society of Radiologists in [place]. Radiology 2005; [id number]. Electronically Signed   By: Richard Harding M.D.   On: 09/05/2014 16:35     Assessment & Plan:  Plan  I have discontinued Richard Harding aspirin, azithromycin, fluticasone, and levocetirizine. I am also having him start on Red Yeast Rice. Additionally, I  am having him maintain his vitamin C, (Flaxseed, Linseed, (FLAX SEED OIL PO)), Misc Natural Products (PROSTATE HEALTH PO), b complex vitamins, niacin, multivitamin, GLUCOSA-CHONDR-NA CHONDR-MSM PO, ONETOUCH DELICA LANCETS 66Y, glucose blood, metoprolol tartrate, ASPIRIN PO, chlorthalidone, and omega-3 acid ethyl esters.  Meds ordered this encounter  Medications  . ASPIRIN PO    Sig: Take by mouth daily. 75 mg (1/4 of the 325 mg)  . chlorthalidone (HYGROTON) 25 MG tablet    Sig: Take 1 tablet (25 mg total) by mouth daily.    Dispense:  90 tablet    Refill:  3  . omega-3 acid ethyl esters (LOVAZA) 1 g capsule    Sig: Take 2 capsules (2 g total) by mouth 2 (two) times daily.    Dispense:  360 capsule    Refill:  0    Requested drug refills are authorized, however, the patient needs further evaluation and/or laboratory testing before further refills are given. Ask him to make an appointment for this.  . Red Yeast Rice 600 MG CAPS    Sig: Take 1 capsule (600 mg total) by mouth daily.    Problem  List Items Addressed This Visit      Unprioritized   DM (diabetes mellitus) type II, controlled, with peripheral vascular disorder ([place]) - Primary    hgba1c to be checked, minimize simple carbs. Increase exercise as tolerated. Continue current meds      Relevant Medications   ASPIRIN PO   chlorthalidone (HYGROTON) 25 MG tablet   omega-3 acid ethyl esters (LOVAZA) 1 g capsule   Other Relevant Orders   Hemoglobin A1c (Completed)   Comprehensive metabolic panel (Completed)   Amb ref to Medical Nutrition Therapy-MNT   Microalbumin / creatinine urine ratio (Completed)   Essential hypertension    Well controlled, no changes to meds. Encouraged heart healthy diet such as the DASH diet and exercise as tolerated.        Relevant Medications   ASPIRIN PO   chlorthalidone (HYGROTON) 25 MG tablet   omega-3 acid ethyl esters (LOVAZA) 1 g capsule   Other Relevant Orders   Comprehensive metabolic  panel (Completed)   Microalbumin / creatinine urine ratio (Completed)   Hyperlipidemia LDL goal <70    Encouraged heart healthy diet, increase exercise, avoid trans fats, consider a krill oil cap daily      Relevant Medications   ASPIRIN PO   chlorthalidone (HYGROTON) 25 MG tablet   omega-3 acid ethyl esters (LOVAZA) 1 g capsule   Red Yeast Rice 600 MG CAPS   Other Relevant Orders   Lipid panel (Completed)   Impacted cerumen    Use debrox and rto for irrigation prn      Relevant Orders   Ear Lavage    Other Visit Diagnoses    Flu vaccine need       Relevant Orders   Flu vaccine HIGH DOSE PF (Fluzone High dose) (Completed)      Follow-up: Return in about 3 months (around 12/14/2017) for hypertension, hyperlipidemia, diabetes II.  Ann Held, DO

## 2017-09-14 NOTE — Patient Instructions (Signed)

## 2017-09-15 LAB — HEMOGLOBIN A1C: HEMOGLOBIN A1C: 6.6 % — AB (ref 4.6–6.5)

## 2017-09-15 LAB — LIPID PANEL
CHOL/HDL RATIO: 7
CHOLESTEROL: 213 mg/dL — AB (ref 0–200)
HDL: 32.2 mg/dL — ABNORMAL LOW (ref >39.00)
NONHDL: 180.79
Triglycerides: 288 mg/dL — ABNORMAL HIGH (ref 0.0–149.0)
VLDL: 57.6 mg/dL — AB (ref 0.0–40.0)

## 2017-09-15 LAB — COMPREHENSIVE METABOLIC PANEL
ALBUMIN: 4.5 g/dL (ref 3.5–5.2)
ALT: 24 U/L (ref 0–53)
AST: 18 U/L (ref 0–37)
Alkaline Phosphatase: 43 U/L (ref 39–117)
BUN: 14 mg/dL (ref 6–23)
CHLORIDE: 97 meq/L (ref 96–112)
CO2: 34 meq/L — AB (ref 19–32)
Calcium: 9.9 mg/dL (ref 8.4–10.5)
Creatinine, Ser: 0.83 mg/dL (ref 0.40–1.50)
GFR: 97.28 mL/min (ref >60.00)
Glucose, Bld: 102 mg/dL — ABNORMAL HIGH (ref 70–99)
POTASSIUM: 3.4 meq/L — AB (ref 3.5–5.1)
SODIUM: 139 meq/L (ref 135–145)
Total Bilirubin: 0.7 mg/dL (ref 0.2–1.2)
Total Protein: 7.5 g/dL (ref 6.0–8.3)

## 2017-09-15 LAB — MICROALBUMIN / CREATININE URINE RATIO
CREATININE, U: 190.9 mg/dL
Microalb Creat Ratio: 0.5 mg/g (ref 0.0–30.0)
Microalb, Ur: 0.9 mg/dL (ref 0.0–1.9)

## 2017-09-15 LAB — LDL CHOLESTEROL, DIRECT: LDL DIRECT: 126 mg/dL

## 2017-09-16 DIAGNOSIS — E119 Type 2 diabetes mellitus without complications: Secondary | ICD-10-CM | POA: Insufficient documentation

## 2017-09-16 DIAGNOSIS — E1151 Type 2 diabetes mellitus with diabetic peripheral angiopathy without gangrene: Secondary | ICD-10-CM | POA: Insufficient documentation

## 2017-09-16 DIAGNOSIS — H6120 Impacted cerumen, unspecified ear: Secondary | ICD-10-CM | POA: Insufficient documentation

## 2017-09-16 NOTE — Assessment & Plan Note (Signed)
Well controlled, no changes to meds. Encouraged heart healthy diet such as the DASH diet and exercise as tolerated.  °

## 2017-09-16 NOTE — Assessment & Plan Note (Signed)
Encouraged heart healthy diet, increase exercise, avoid trans fats, consider a krill oil cap daily 

## 2017-09-16 NOTE — Assessment & Plan Note (Signed)
Use debrox and rto for irrigation prn  

## 2017-09-16 NOTE — Assessment & Plan Note (Addendum)
hgba1c to be checked, minimize simple carbs. Increase exercise as tolerated. Continue current meds  

## 2017-09-17 ENCOUNTER — Telehealth: Payer: Self-pay | Admitting: Family

## 2017-09-20 ENCOUNTER — Telehealth: Payer: Self-pay

## 2017-09-20 DIAGNOSIS — E119 Type 2 diabetes mellitus without complications: Secondary | ICD-10-CM

## 2017-09-20 DIAGNOSIS — E785 Hyperlipidemia, unspecified: Secondary | ICD-10-CM

## 2017-09-20 MED ORDER — METFORMIN HCL ER 500 MG PO TB24: 500.0000 mg | ORAL_TABLET | Freq: Every evening | ORAL | 2 refills | Status: DC

## 2017-09-20 MED ORDER — PRAVASTATIN SODIUM 20 MG PO TABS: 20.0000 mg | ORAL_TABLET | Freq: Every evening | ORAL | 0 refills | Status: DC

## 2017-09-20 NOTE — Telephone Encounter (Signed)
-----   Message from Ann Held, DO sent at 09/17/2017  8:46 PM EDT ----- + DM--  Metformin xr 500 mg #30  1 po qpm, 2 refills  Cholesterol--- LDL goal < 70,  HDL >40,  TG < 150.  Diet and exercise will increase HDL and decrease LDL and TG.  Fish,  Fish Oil, Flaxseed oil will also help increase the HDL and decrease Triglycerides.   Recheck labs in 3 months-----  Start pravachol 20 mg #30 2 refills 1 po qpm  Lipid, cmp, hgba1c---- must be done in 3 months since we are starting new medication

## 2017-09-20 NOTE — Telephone Encounter (Signed)
Pt agreed to start Metformin ER 500mg  1qpm #30+2 faxed and Pravachol 20mg  1qpm #90 faxed/created future order for 3 months/thx dmf

## 2017-09-29 NOTE — Telephone Encounter (Signed)
Opened in error

## 2017-11-16 ENCOUNTER — Other Ambulatory Visit: Payer: Self-pay | Admitting: *Deleted

## 2017-11-16 MED ORDER — METFORMIN HCL ER 500 MG PO TB24: 500.0000 mg | ORAL_TABLET | Freq: Every evening | ORAL | 0 refills | Status: DC

## 2017-12-10 ENCOUNTER — Telehealth: Payer: Self-pay | Admitting: Family Medicine

## 2017-12-10 DIAGNOSIS — E785 Hyperlipidemia, unspecified: Secondary | ICD-10-CM

## 2017-12-10 DIAGNOSIS — I10 Essential (primary) hypertension: Secondary | ICD-10-CM

## 2017-12-10 MED ORDER — METFORMIN HCL ER 500 MG PO TB24: 500.0000 mg | ORAL_TABLET | Freq: Every evening | ORAL | 1 refills | Status: DC

## 2017-12-10 MED ORDER — OMEGA-3-ACID ETHYL ESTERS 1 G PO CAPS: 2.0000 | ORAL_CAPSULE | Freq: Two times a day (BID) | ORAL | 1 refills | Status: DC

## 2017-12-10 MED ORDER — CHLORTHALIDONE 25 MG PO TABS: 25.0000 mg | ORAL_TABLET | Freq: Every day | ORAL | 1 refills | Status: DC

## 2017-12-10 MED ORDER — METOPROLOL TARTRATE 50 MG PO TABS: 50.0000 mg | ORAL_TABLET | Freq: Two times a day (BID) | ORAL | 1 refills | Status: DC

## 2017-12-10 NOTE — Telephone Encounter (Signed)
Patient notified that rx were sent in

## 2017-12-10 NOTE — Telephone Encounter (Signed)
Copied from [place] [id number]. Topic: Quick Communication - See Telephone Encounter >> Dec 10, 2017 12:11 PM Oneta Rack wrote: CRM for notification. See Telephone encounter for:   12/10/17.   Relation to BS:[facility]QP Call back number: [phone] Pharmacy: [facility], [place]. [phone] (Phone) [phone] (Fax)  Reason for call:  Patient requesting  6 month supply metoprolol tartrate (LOPRESSOR) 50 MG tablet, omega-3 acid ethyl esters (LOVAZA) 1 g capsule, metFORMIN (GLUCOPHAGE-XR) 500 MG 24 hr tablet,  chlorthalidone (HYGROTON) 25 MG tablet

## 2017-12-29 ENCOUNTER — Telehealth: Payer: Self-pay | Admitting: Family Medicine

## 2017-12-29 NOTE — Telephone Encounter (Signed)
Copied from [place]. Topic: Quick Communication - See Telephone Encounter >> Dec 29, 2017  3:15 PM Rosalin Hawking wrote: CRM for notification. See Telephone encounter for:  12/29/17.   Pt dropped off document to be filled out by provider ( 1 page Disability parking Placard). Pt would like to have document mailed to his home address [place], [place] [address] when document done. Document put at front office tray.

## 2018-01-03 NOTE — Telephone Encounter (Signed)
Received Application and Renewal of Disability Parking Placard form [place] [employer]; pt request mail but will have to come back in to office to sign; forward to provider/SLS 01/14

## 2018-01-04 NOTE — Telephone Encounter (Signed)
Called patient to find out reason he need the handicap placard.  He states that he is in the process of getting a knee replacement.  He is unable to get in and sign his part.  Advised that we will go ahead and mail but in the future he needs to fill out his part completely.

## 2018-06-01 ENCOUNTER — Telehealth: Payer: Self-pay | Admitting: Family Medicine

## 2018-06-01 NOTE — Telephone Encounter (Signed)
Pt walked in to [facility] to transfer from your practice and we just need to make sure he is not in the dismissal process.   Is it ok with Dr. Etter Sjogren to transfer

## 2018-06-02 NOTE — Telephone Encounter (Signed)
That is fine--  Make physician aware he is non compliant

## 2018-06-02 NOTE — Telephone Encounter (Signed)
Yes okay to transfer.

## 2018-06-03 NOTE — Telephone Encounter (Signed)
Patient is already scheduled to see Dr. Deborra Medina on 06/09/18 @12 :15pm.

## 2018-06-09 ENCOUNTER — Ambulatory Visit: Payer: Medicare Other | Admitting: Family Medicine

## 2018-06-09 ENCOUNTER — Other Ambulatory Visit: Payer: Self-pay

## 2018-06-09 VITALS — BP 130/64 | HR 76 | Temp 98.6°F | Ht 68.0 in | Wt 188.0 lb

## 2018-06-09 DIAGNOSIS — E1151 Type 2 diabetes mellitus with diabetic peripheral angiopathy without gangrene: Secondary | ICD-10-CM | POA: Diagnosis not present

## 2018-06-09 DIAGNOSIS — I10 Essential (primary) hypertension: Secondary | ICD-10-CM | POA: Diagnosis not present

## 2018-06-09 DIAGNOSIS — E785 Hyperlipidemia, unspecified: Secondary | ICD-10-CM

## 2018-06-09 DIAGNOSIS — H6122 Impacted cerumen, left ear: Secondary | ICD-10-CM | POA: Diagnosis not present

## 2018-06-09 DIAGNOSIS — H6120 Impacted cerumen, unspecified ear: Secondary | ICD-10-CM | POA: Insufficient documentation

## 2018-06-09 LAB — COMPREHENSIVE METABOLIC PANEL
ALBUMIN: 4.3 g/dL (ref 3.5–5.2)
ALT: 17 U/L (ref 0–53)
AST: 14 U/L (ref 0–37)
Alkaline Phosphatase: 47 U/L (ref 39–117)
BUN: 13 mg/dL (ref 6–23)
CHLORIDE: 99 meq/L (ref 96–112)
CO2: 31 mEq/L (ref 19–32)
Calcium: 9.6 mg/dL (ref 8.4–10.5)
Creatinine, Ser: 1.03 mg/dL (ref 0.40–1.50)
GFR: 75.66 mL/min (ref >60.00)
Glucose, Bld: 106 mg/dL — ABNORMAL HIGH (ref 70–99)
Potassium: 3.2 mEq/L — ABNORMAL LOW (ref 3.5–5.1)
Sodium: 138 mEq/L (ref 135–145)
Total Bilirubin: 0.5 mg/dL (ref 0.2–1.2)
Total Protein: 7.4 g/dL (ref 6.0–8.3)

## 2018-06-09 LAB — LDL CHOLESTEROL, DIRECT: Direct LDL: 121 mg/dL

## 2018-06-09 LAB — LIPID PANEL
CHOLESTEROL: 195 mg/dL (ref 0–200)
HDL: 32.3 mg/dL — ABNORMAL LOW (ref >39.00)
Total CHOL/HDL Ratio: 6

## 2018-06-09 LAB — HEMOGLOBIN A1C: Hgb A1c MFr Bld: 6.3 % (ref 4.6–6.5)

## 2018-06-09 MED ORDER — METOPROLOL TARTRATE 50 MG PO TABS: 50.0000 mg | ORAL_TABLET | Freq: Two times a day (BID) | ORAL | 3 refills | Status: DC

## 2018-06-09 MED ORDER — CHLORTHALIDONE 25 MG PO TABS: 25.0000 mg | ORAL_TABLET | Freq: Every day | ORAL | 3 refills | Status: DC

## 2018-06-09 MED ORDER — OMEGA-3-ACID ETHYL ESTERS 1 G PO CAPS: 1.0000 g | ORAL_CAPSULE | Freq: Two times a day (BID) | ORAL | 3 refills | Status: DC

## 2018-06-09 MED ORDER — METFORMIN HCL ER 500 MG PO TB24: 500.0000 mg | ORAL_TABLET | Freq: Every evening | ORAL | 3 refills | Status: DC

## 2018-06-09 NOTE — Progress Notes (Signed)
Subjective:   Patient ID: Richard Harding, male    DOB: [birth year], 72 y.o.   MRN: [id number]  Richard Harding is a pleasant 72 y.o. year old male who presents to clinic today with [place] (would like to have med refills on metroprolol, metformin, chlorthalidone and omeg 3. would like to know if he can have his ear cleaned out)  on 06/09/2018  HPI:  Pt is new to me.  Establishing care from Dr. Etter Sjogren. Chart reviewed.  DM- Currently has been controlled with Metformin XR 500 mg daily. Lab Results  Component Value Date   HGBA1C 6.6 (H) 09/14/2017   HLD- Taking pravachol 20 mg daily, red yeast rice and Lovaza. Lab Results  Component Value Date   CHOL 213 (H) 09/14/2017   HDL 32.20 (L) 09/14/2017   LDLDIRECT 126.0 09/14/2017   TRIG 288.0 (H) 09/14/2017   CHOLHDL 7 09/14/2017   On Metoprolol 50 mg twice daily and hygroton for HTN. Lab Results  Component Value Date   CREATININE 0.83 09/14/2017      Current Outpatient Medications on File Prior to Visit  Medication Sig Dispense Refill  . Ascorbic Acid (VITAMIN C) 1000 MG tablet Take 2,000 mg by mouth daily.    . ASPIRIN PO Take by mouth daily. 75 mg (1/4 of the 325 mg)    . b complex vitamins tablet Take 2 tablets by mouth daily.    . chlorthalidone (HYGROTON) 25 MG tablet Take 1 tablet (25 mg total) by mouth daily. 90 tablet 1  . Flaxseed, Linseed, (FLAX SEED OIL PO) Take 3 capsules by mouth daily. 1400 mg each    . GLUCOSA-CHONDR-NA CHONDR-MSM PO Take 2 capsules by mouth daily.    Marland Kitchen glucose blood (ONETOUCH VERIO) test strip Check blood sugar once daily. Dx: E11.9 50 each 5  . metFORMIN (GLUCOPHAGE-XR) 500 MG 24 hr tablet Take 1 tablet (500 mg total) by mouth every evening. 90 tablet 1  . metoprolol tartrate (LOPRESSOR) 50 MG tablet Take 1 tablet (50 mg total) by mouth 2 (two) times daily. 180 tablet 1  . Misc Natural Products (PROSTATE HEALTH PO) Take 1 capsule by mouth daily.     . Multiple Vitamin (MULTIVITAMIN)  tablet Take 1 tablet by mouth daily.    . niacin 500 MG tablet Take 500 mg by mouth daily.    Marland Kitchen omega-3 acid ethyl esters (LOVAZA) 1 g capsule Take 1 g by mouth 2 (two) times daily.    Glory Rosebush DELICA LANCETS 96Q MISC Check blood sugar once daily. Dx: E11.9 50 each 5  . pravastatin (PRAVACHOL) 20 MG tablet Take 1 tablet (20 mg total) by mouth every evening. 90 tablet 0  . Red Yeast Rice 600 MG CAPS Take 1 capsule (600 mg total) by mouth daily.     No current facility-administered medications on file prior to visit.     No Known Allergies  Past Medical History:  Diagnosis Date  . Chickenpox   . Hyperlipemia   . Hypertension     Past Surgical History:  Procedure Laterality Date  . NECK SURGERY      No family history on file.  Social History   Socioeconomic History  . Marital status: Single    Spouse name: Not on file  . Number of children: Not on file  . Years of education: Not on file  . Highest education level: Not on file  Occupational History  . Not on file  Social Needs  .  Financial resource strain: Not on file  . Food insecurity:    Worry: Not on file    Inability: Not on file  . Transportation needs:    Medical: Not on file    Non-medical: Not on file  Tobacco Use  . Smoking status: Never Smoker  . Smokeless tobacco: Never Used  Substance and Sexual Activity  . Alcohol use: No  . Drug use: No  . Sexual activity: Not on file  Lifestyle  . Physical activity:    Days per week: Not on file    Minutes per session: Not on file  . Stress: Not on file  Relationships  . Social connections:    Talks on phone: Not on file    Gets together: Not on file    Attends religious service: Not on file    Active member of club or organization: Not on file    Attends meetings of clubs or organizations: Not on file    Relationship status: Not on file  . Intimate partner violence:    Fear of current or ex partner: Not on file    Emotionally abused: Not on file     Physically abused: Not on file    Forced sexual activity: Not on file  Other Topics Concern  . Not on file  Social History Narrative  . Not on file   The PMH, PSH, Social History, Family History, Medications, and allergies have been reviewed in [facility], and have been updated if relevant.   Review of Systems  Constitutional: Negative.   HENT: Positive for ear pain and hearing loss. Negative for congestion, dental problem, drooling, ear discharge, facial swelling, mouth sores, nosebleeds, postnasal drip, rhinorrhea, sinus pressure, sinus pain, sneezing, sore throat, tinnitus, trouble swallowing and voice change.   Respiratory: Negative.   Cardiovascular: Negative.   Gastrointestinal: Negative.   Genitourinary: Negative.   Musculoskeletal: Negative.   Neurological: Negative.   Hematological: Negative.   Psychiatric/Behavioral: Negative.   All other systems reviewed and are negative.      Objective:    BP 130/64 (BP Location: Left Arm, Patient Position: Sitting, Cuff Size: Normal)   Pulse 76   Temp 98.6 F (37 C) (Oral)   Ht 5\' 8"  (1.727 m)   Wt 188 lb (85.3 kg)   SpO2 95%   BMI 28.59 kg/m    Physical Exam  General:  pleasant male in no acute distress Eyes:  PERRL Ears:  Left ear impacted cerumen impaction, right TM normal Hearing is grossly normal bilaterally. Nose:  External nasal examination shows no deformity or inflammation. Nasal mucosa are pink and moist without lesions or exudates. Mouth:  Oral mucosa and oropharynx without lesions or exudates.  Teeth in good repair. Neck:  no carotid bruit or thyromegaly no cervical or supraclavicular lymphadenopathy  Lungs:  Normal respiratory effort, chest expands symmetrically. Lungs are clear to auscultation, no crackles or wheezes. Heart:  Normal rate and regular rhythm. S1 and S2 normal without gallop, murmur, click, rub or other extra sounds. Abdomen:  Bowel sounds positive,abdomen soft and non-tender without masses,  organomegaly or hernias noted. Pulses:  R and L posterior tibial pulses are full and equal bilaterally  Extremities:  no edema  Psych:  Good eye contact, not anxious or depressed appearing       Assessment & Plan:   Hyperlipidemia LDL goal <70  Essential hypertension  DM (diabetes mellitus) type II, controlled, with peripheral vascular disorder (HCC)  Impacted cerumen, unspecified laterality No follow-ups on  file.

## 2018-06-09 NOTE — Patient Instructions (Signed)
  Great to meet you. I will call you with your lab results from today and you can view them online.   Use dilute hydrogen peroxide (1:1 with water) a few drops in ears as needed to help break down ear wax.

## 2018-06-09 NOTE — Assessment & Plan Note (Signed)
Ceruminosis is noted.  Wax is removed by syringing and manual debridement. Instructions for home care to prevent wax buildup are given.  

## 2018-06-13 ENCOUNTER — Telehealth: Payer: Self-pay | Admitting: Family Medicine

## 2018-06-13 ENCOUNTER — Encounter: Payer: Self-pay | Admitting: Family Medicine

## 2018-06-13 MED ORDER — BLOOD GLUCOSE MONITOR KIT: PACK | 0 refills | Status: DC

## 2018-06-13 MED ORDER — OMEGA-3-ACID ETHYL ESTERS 1 G PO CAPS: 1.0000 g | ORAL_CAPSULE | Freq: Four times a day (QID) | ORAL | 1 refills | Status: DC

## 2018-06-13 NOTE — Telephone Encounter (Signed)
rx sent in as pt requested

## 2018-07-20 ENCOUNTER — Telehealth: Payer: Self-pay | Admitting: Family Medicine

## 2018-07-20 DIAGNOSIS — I10 Essential (primary) hypertension: Secondary | ICD-10-CM

## 2018-07-20 DIAGNOSIS — E119 Type 2 diabetes mellitus without complications: Secondary | ICD-10-CM

## 2018-07-20 DIAGNOSIS — E1151 Type 2 diabetes mellitus with diabetic peripheral angiopathy without gangrene: Secondary | ICD-10-CM

## 2018-07-20 DIAGNOSIS — E785 Hyperlipidemia, unspecified: Secondary | ICD-10-CM

## 2018-07-20 NOTE — Telephone Encounter (Signed)
Copied from [place] [id number]. Topic: Quick Communication - Rx Refill/Question >> Jul 20, 2018  2:43 PM Scherrie Gerlach wrote: Reason for CRM: pt states at last visit his triglycerides were very high.  Pt states a nurse came out for [employer] well visit yesterday.  She recommended the pt ask the dr for a Rx for fenofibrate.  Also wants a Rx for Niacin. It is too expensive OTC, but will be $3.00 with a script from the dr. Serafina Royals mg the dr recommends   [facility] [address] [place], [place]. [phone] (Phone) [phone] (Fax)  Pt states if the dr does not agree, please call him back

## 2018-07-21 ENCOUNTER — Other Ambulatory Visit: Payer: Self-pay

## 2018-07-21 MED ORDER — NIACIN 500 MG PO TABS: 500.0000 mg | ORAL_TABLET | Freq: Every day | ORAL | 3 refills | Status: DC

## 2018-07-21 NOTE — Telephone Encounter (Signed)
Yes okay to refill his niacin.  Do we know if his elevated TG in the chart (05/2018) were fasting?

## 2018-07-21 NOTE — Telephone Encounter (Signed)
Dr. Deborra Medina please advise, see lab 05/2018. And okey to send Rx for Niacin?

## 2018-07-21 NOTE — Telephone Encounter (Signed)
TA-The original note was supposed to include a request for Fenofibrate along with the Niacin/He says "I hope she can forgive me but I cannot come for the lab right now because I am paying the bill for the other lab. Please tell her I never wanted to take a Statin but I would like to try the Fenofibrate."  He agreed that he would be willing to have the fasting lab drawn within 3 months and says that he cannot afford to pay for the lab to be done right now/Plz advise if ok to send in the Fenofibrate and what strength and I can create a future lab order for a fasting Lipid Panel and schedule him/thx dmf

## 2018-07-21 NOTE — Telephone Encounter (Signed)
TA-Patient states that he was not fasting for that lab draw/also states that over the years no matter what he has done his TG have been high and he is worried about hardening of the arteries/I will send in the Niacin/plz advise if there is anything else you would like for me to advise him/thx dmf

## 2018-07-21 NOTE — Telephone Encounter (Signed)
Let's check a fasting lipid panel first.

## 2018-07-21 NOTE — Telephone Encounter (Signed)
Yes that is reasonable. Please call in rx for fenofibrate 54 mg- 1 tab by mouth daily, #30 with 3 refills and schedule the follow up labs as stated.

## 2018-07-22 MED ORDER — FENOFIBRATE 54 MG PO TABS: 54.0000 mg | ORAL_TABLET | Freq: Every day | ORAL | 1 refills | Status: DC

## 2018-07-22 NOTE — Telephone Encounter (Signed)
Pt aware that Rx was sent in and he will call and schedule a fasting lab visit in November/future order created/thx dmf

## 2018-08-25 ENCOUNTER — Telehealth: Payer: Self-pay | Admitting: Family Medicine

## 2018-08-25 NOTE — Telephone Encounter (Signed)
Copied from [place] [id number]. Topic: Quick Communication - See Telephone Encounter >> Aug 25, 2018 10:54 AM Rutherford Nail, NT wrote: CRM for notification. See Telephone encounter for: 08/25/18. Clarise Cruz with [employer] calling and states for the patient to meet the guidelines for diabetic patients for 2019, the patient is required to be on a statin medication. States that it looked like Dr Deborra Medina wrote a statin medication for the patient, but was filled and then reversed, so he never did pick up the statin medication. Wanted to know if this could that be assessed at next OV? CB#: [phone]

## 2018-08-26 NOTE — Telephone Encounter (Signed)
Please see phone note below from 7/31- he stated that he did not want to take a statin.  Is he willing to take one now?  We can resend rx and have him follow up in 1 month for labs or labs and OV.  TA-The original note was supposed to include a request for Fenofibrate along with the Niacin/He says "I hope she can forgive me but I cannot come for the lab right now because I am paying the bill for the other lab. Please tell her I never wanted to take a Statin but I would like to try the Fenofibrate."  He agreed that he would be willing to have the fasting lab drawn within 3 months and says that he cannot afford to pay for the lab to be done right now/Plz advise if ok to send in the Fenofibrate and what strength and I can create a future lab order for a fasting Lipid Panel and schedule him/thx dmf

## 2018-08-26 NOTE — Telephone Encounter (Signed)
Information that you may need for future OV

## 2018-08-30 NOTE — Telephone Encounter (Signed)
Pt not willing to be on a statin at this time/thx dmf

## 2018-10-20 ENCOUNTER — Other Ambulatory Visit: Payer: Self-pay | Admitting: *Deleted

## 2018-10-20 MED ORDER — OMEGA-3-ACID ETHYL ESTERS 1 G PO CAPS: 1.0000 g | ORAL_CAPSULE | Freq: Four times a day (QID) | ORAL | 1 refills | Status: DC

## 2018-11-22 ENCOUNTER — Other Ambulatory Visit: Payer: Self-pay | Admitting: *Deleted

## 2018-11-22 MED ORDER — OMEGA-3-ACID ETHYL ESTERS 1 G PO CAPS: 1.0000 g | ORAL_CAPSULE | Freq: Four times a day (QID) | ORAL | 1 refills | Status: DC

## 2019-01-12 ENCOUNTER — Encounter: Payer: Self-pay | Admitting: Family Medicine

## 2019-01-12 ENCOUNTER — Ambulatory Visit: Payer: Medicare Other | Admitting: Family Medicine

## 2019-01-12 VITALS — BP 128/74 | HR 72 | Temp 97.8°F | Ht 68.0 in | Wt 194.4 lb

## 2019-01-12 DIAGNOSIS — E876 Hypokalemia: Secondary | ICD-10-CM

## 2019-01-12 DIAGNOSIS — E785 Hyperlipidemia, unspecified: Secondary | ICD-10-CM

## 2019-01-12 DIAGNOSIS — I10 Essential (primary) hypertension: Secondary | ICD-10-CM

## 2019-01-12 DIAGNOSIS — E1151 Type 2 diabetes mellitus with diabetic peripheral angiopathy without gangrene: Secondary | ICD-10-CM | POA: Diagnosis not present

## 2019-01-12 HISTORY — DX: Hypokalemia: E87.6

## 2019-01-12 LAB — LIPID PANEL
Cholesterol: 202 mg/dL — ABNORMAL HIGH (ref 0–200)
HDL: 32.9 mg/dL — AB (ref >39.00)
NonHDL: 168.86
Total CHOL/HDL Ratio: 6
Triglycerides: 212 mg/dL — ABNORMAL HIGH (ref 0.0–149.0)
VLDL: 42.4 mg/dL — ABNORMAL HIGH (ref 0.0–40.0)

## 2019-01-12 LAB — LDL CHOLESTEROL, DIRECT: Direct LDL: 140 mg/dL

## 2019-01-12 LAB — MICROALBUMIN / CREATININE URINE RATIO
Creatinine,U: 85.6 mg/dL
MICROALB UR: 1 mg/dL (ref 0.0–1.9)
Microalb Creat Ratio: 1.2 mg/g (ref 0.0–30.0)

## 2019-01-12 LAB — COMPREHENSIVE METABOLIC PANEL
ALBUMIN: 4.5 g/dL (ref 3.5–5.2)
ALK PHOS: 51 U/L (ref 39–117)
ALT: 17 U/L (ref 0–53)
AST: 14 U/L (ref 0–37)
BUN: 16 mg/dL (ref 6–23)
CALCIUM: 9.9 mg/dL (ref 8.4–10.5)
CO2: 32 mEq/L (ref 19–32)
Chloride: 96 mEq/L (ref 96–112)
Creatinine, Ser: 0.89 mg/dL (ref 0.40–1.50)
GFR: 84.12 mL/min (ref >60.00)
Glucose, Bld: 107 mg/dL — ABNORMAL HIGH (ref 70–99)
POTASSIUM: 3.6 meq/L (ref 3.5–5.1)
Sodium: 137 mEq/L (ref 135–145)
TOTAL PROTEIN: 7.3 g/dL (ref 6.0–8.3)
Total Bilirubin: 0.5 mg/dL (ref 0.2–1.2)

## 2019-01-12 LAB — POCT GLYCOSYLATED HEMOGLOBIN (HGB A1C)
HEMOGLOBIN A1C: 6.2 % — AB (ref 4.0–5.6)
HbA1c POC (<> result, manual entry): 6.2 % (ref 4.0–5.6)

## 2019-01-12 MED ORDER — CHLORTHALIDONE 25 MG PO TABS: 25.0000 mg | ORAL_TABLET | Freq: Every day | ORAL | 3 refills | Status: DC

## 2019-01-12 MED ORDER — METOPROLOL TARTRATE 50 MG PO TABS: 50.0000 mg | ORAL_TABLET | Freq: Two times a day (BID) | ORAL | 3 refills | Status: DC

## 2019-01-12 MED ORDER — BLOOD GLUCOSE MONITOR KIT: PACK | 0 refills | Status: DC

## 2019-01-12 MED ORDER — OMEGA-3-ACID ETHYL ESTERS 1 G PO CAPS: 1.0000 g | ORAL_CAPSULE | Freq: Four times a day (QID) | ORAL | 3 refills | Status: DC

## 2019-01-12 MED ORDER — FENOFIBRATE 54 MG PO TABS: 54.0000 mg | ORAL_TABLET | Freq: Every day | ORAL | 3 refills | Status: DC

## 2019-01-12 MED ORDER — METFORMIN HCL ER 500 MG PO TB24: 500.0000 mg | ORAL_TABLET | Freq: Every evening | ORAL | 3 refills | Status: DC

## 2019-01-12 MED ORDER — NIACIN 500 MG PO TABS: 500.0000 mg | ORAL_TABLET | Freq: Every day | ORAL | 3 refills | Status: DC

## 2019-01-12 NOTE — Assessment & Plan Note (Signed)
Well controlled. No changes made today. 

## 2019-01-12 NOTE — Assessment & Plan Note (Signed)
Repeat electrolytes today.

## 2019-01-12 NOTE — Addendum Note (Signed)
Addended by: Marrion Coy on: 01/12/2019 12:12 PM   Modules accepted: Orders

## 2019-01-12 NOTE — Progress Notes (Signed)
Subjective:   Patient ID: Richard Harding, male    DOB: 06/09/1946, 73 y.o.   MRN: [id number]  Richard Harding is a pleasant 73 y.o. year old male who presents to clinic today with Follow-up (Patient is here today to F/U.  Last Lipid Panel drawn showed Trig at 406.  He was started on Fenofibrate 44m 1qd. He is also here today to F/U with DM.  Last A1C was 6.3 on 6.20.2019.  He needs his prescriptions 90d+3 refills printed so he can take it to the pharmacy.)  on 01/12/2019  HPI:  Here to follow up.  I last saw him on 06/09/18.  Note reviewed.  DM- Currently has been controlled with Metformin XR 500 mg daily. Lab Results  Component Value Date   HGBA1C 6.3 06/09/2018     HLD- He was taking pravachol 20 mg daily, red yeast rice and Lovaza.  TG were very high so we added fenofibrate 54 mg daily.  Lab Results  Component Value Date   CHOL 195 06/09/2018   HDL 32.30 (L) 06/09/2018   LDLDIRECT 121.0 06/09/2018   TRIG (H) 06/09/2018    406.0 Triglyceride is over 400; calculations on Lipids are invalid.   CHOLHDL 6 06/09/2018     On Metoprolol 50 mg twice daily and hygroton for HTN.  BP Readings from Last 3 Encounters:  01/12/19 128/74  06/09/18 130/64  09/14/17 118/76   Lab Results  Component Value Date   CREATININE 1.03 06/09/2018   Potassium was also a bit low so I advised adding foods higher in potassium.  Lab Results  Component Value Date   NA 138 06/09/2018   K 3.2 (L) 06/09/2018   CL 99 06/09/2018   CO2 31 06/09/2018     Current Outpatient Medications on File Prior to Visit  Medication Sig Dispense Refill  . Ascorbic Acid (VITAMIN C) 1000 MG tablet Take 2,000 mg by mouth daily.    . ASPIRIN PO Take by mouth daily. 75 mg (1/4 of the 325 mg)    . b complex vitamins tablet Take 1 tablet by mouth daily.     . blood glucose meter kit and supplies KIT Check blood glucose once daily. E11.9. 1 each 0  . Flaxseed, Linseed, (FLAX SEED OIL PO) Take 3 capsules by  mouth daily. 1400 mg each    . GLUCOSA-CHONDR-NA CHONDR-MSM PO Take 2 capsules by mouth daily.    . Multiple Vitamin (MULTIVITAMIN) tablet Take 1 tablet by mouth daily.    . niacin 500 MG tablet Take 1 tablet (500 mg total) by mouth daily. 90 tablet 3  . omega-3 acid ethyl esters (LOVAZA) 1 g capsule Take 1 capsule (1 g total) by mouth 4 (four) times daily. 360 capsule 1  . Red Yeast Rice 600 MG CAPS Take 1 capsule (600 mg total) by mouth daily.    . saw palmetto 500 MG capsule Take 500 mg by mouth daily.     No current facility-administered medications on file prior to visit.     No Known Allergies  Past Medical History:  Diagnosis Date  . Chickenpox   . Hyperlipemia   . Hypertension     Past Surgical History:  Procedure Laterality Date  . NECK SURGERY      No family history on file.  Social History   Socioeconomic History  . Marital status: Single    Spouse name: Not on file  . Number of children: Not on file  . Years  of education: Not on file  . Highest education level: Not on file  Occupational History  . Not on file  Social Needs  . Financial resource strain: Not on file  . Food insecurity:    Worry: Not on file    Inability: Not on file  . Transportation needs:    Medical: Not on file    Non-medical: Not on file  Tobacco Use  . Smoking status: Never Smoker  . Smokeless tobacco: Never Used  Substance and Sexual Activity  . Alcohol use: No  . Drug use: No  . Sexual activity: Not on file  Lifestyle  . Physical activity:    Days per week: Not on file    Minutes per session: Not on file  . Stress: Not on file  Relationships  . Social connections:    Talks on phone: Not on file    Gets together: Not on file    Attends religious service: Not on file    Active member of club or organization: Not on file    Attends meetings of clubs or organizations: Not on file    Relationship status: Not on file  . Intimate partner violence:    Fear of current or ex  partner: Not on file    Emotionally abused: Not on file    Physically abused: Not on file    Forced sexual activity: Not on file  Other Topics Concern  . Not on file  Social History Narrative  . Not on file   The PMH, PSH, Social History, Family History, Medications, and allergies have been reviewed in [facility], and have been updated if relevant.    Review of Systems  Constitutional: Negative.   HENT: Negative.   Eyes: Negative.   Respiratory: Negative.   Cardiovascular: Negative.   Gastrointestinal: Negative.   Endocrine: Negative.   Genitourinary: Negative.   Musculoskeletal: Negative.   Skin: Negative.   Neurological: Negative.   Hematological: Negative.   Psychiatric/Behavioral: Negative.   All other systems reviewed and are negative.      Objective:    BP 128/74 (BP Location: Left Arm, Patient Position: Sitting, Cuff Size: Normal)   Pulse 72   Temp 97.8 F (36.6 C) (Oral)   Ht _0  (1.727 m)   Wt 194 lb 6.4 oz (88.2 kg)   SpO2 98%   BMI 29.56 kg/m    Physical Exam Vitals signs and nursing note reviewed.  Constitutional:      Appearance: Normal appearance. He is normal weight.  HENT:     Head: Normocephalic and atraumatic.     Right Ear: Tympanic membrane normal.     Nose: Nose normal.     Mouth/Throat:     Mouth: Mucous membranes are moist.  Eyes:     Pupils: Pupils are equal, round, and reactive to light.  Neck:     Musculoskeletal: Normal range of motion.  Cardiovascular:     Rate and Rhythm: Normal rate and regular rhythm.     Pulses: Normal pulses.     Heart sounds: Normal heart sounds.  Pulmonary:     Effort: Pulmonary effort is normal.     Breath sounds: Normal breath sounds.  Musculoskeletal: Normal range of motion.        General: No swelling.  Skin:    General: Skin is warm and dry.  Neurological:     General: No focal deficit present.     Mental Status: He is alert.  Psychiatric:  Mood and Affect: Mood normal.        Behavior:  Behavior normal.        Thought Content: Thought content normal.        Judgment: Judgment normal.           Assessment & Plan:   DM (diabetes mellitus) type II, controlled, with peripheral vascular disorder ([place]) - Plan: POCT HgB A1C, Urine Microalbumin w/creat. ratio  Essential hypertension - Plan: Lipid Profile, Comp Met (CMET), chlorthalidone (HYGROTON) 25 MG tablet, metoprolol tartrate (LOPRESSOR) 50 MG tablet  Hyperlipidemia LDL goal <70 - Plan: Lipid Profile, Comp Met (CMET)  Hypokalemia No follow-ups on file.

## 2019-01-12 NOTE — Addendum Note (Signed)
Addended by: Marrion Coy on: 01/12/2019 12:02 PM   Modules accepted: Orders

## 2019-01-12 NOTE — Patient Instructions (Signed)
Great to see you. I will call you with your lab results from today and you can view them online.   

## 2019-01-12 NOTE — Assessment & Plan Note (Signed)
Deteriorated with elevated TG. Added fenofibrate. Repeat lipid panel today.

## 2019-03-17 ENCOUNTER — Ambulatory Visit: Payer: Self-pay

## 2019-03-17 NOTE — Telephone Encounter (Signed)
Pt called to report tick bite last Saturday. He was seeking information on symptoms and concerned that he may need treatment. He states that the tick was very small.  He found it on his stomach after working in the garden last Saturday. He estimates the tick was on him 3-6 hours.  He initially had itching in the area. He states that the scratched a scab from the area. Today he states that he has had no fever. He states the itching has stopped. The area looks like a mosquito bite.  He denies bulls eye. Not round but elongated redness.  The area is about the size of a nickel. He states that he feels it is improving or not getting worse. Pt was read S/S of lyme disease and RMSF. Home care instructions including how to take care of the area. Pt verbalized understanding of all instructions. He will call or seek medical care if he develops symptoms.  Reason for Disposition . [1] Scab is present AND [2] it drains pus or increases in size  Answer Assessment - Initial Assessment Questions 1. TYPE of TICK: "Is it a wood tick or a deer tick?" If unsure, ask: "What size was the tick?" "Did it look more like a watermelon seed or a poppy seed?"      Deer tick possible 2. LOCATION: "Where is the tick bite located?"      stomach 3. ONSET: "How long do you think the tick was attached before you removed it?" (Hours or days)      3-6 hours 4. TETANUS: "When was the last tetanus booster?"      Needs tetanus per patient 5. PREGNANCY: "Is there any chance you are pregnant?" "When was your last menstrual period?"     N/A  Protocols used: TICK BITE-A-AH

## 2019-03-17 NOTE — Telephone Encounter (Signed)
Dr. Armanda Magic

## 2019-11-19 ENCOUNTER — Other Ambulatory Visit: Payer: Self-pay | Admitting: Family Medicine

## 2019-12-20 DIAGNOSIS — H04123 Dry eye syndrome of bilateral lacrimal glands: Secondary | ICD-10-CM | POA: Diagnosis not present

## 2019-12-20 DIAGNOSIS — H10413 Chronic giant papillary conjunctivitis, bilateral: Secondary | ICD-10-CM | POA: Diagnosis not present

## 2019-12-20 DIAGNOSIS — E119 Type 2 diabetes mellitus without complications: Secondary | ICD-10-CM | POA: Diagnosis not present

## 2019-12-20 DIAGNOSIS — H2513 Age-related nuclear cataract, bilateral: Secondary | ICD-10-CM | POA: Diagnosis not present

## 2019-12-20 LAB — HM DIABETES EYE EXAM

## 2019-12-28 DIAGNOSIS — H524 Presbyopia: Secondary | ICD-10-CM | POA: Diagnosis not present

## 2020-02-28 ENCOUNTER — Other Ambulatory Visit: Payer: Self-pay

## 2020-02-29 ENCOUNTER — Encounter: Payer: Self-pay | Admitting: Family Medicine

## 2020-02-29 ENCOUNTER — Ambulatory Visit (INDEPENDENT_AMBULATORY_CARE_PROVIDER_SITE_OTHER): Payer: Medicare Other | Admitting: Family Medicine

## 2020-02-29 VITALS — BP 130/82 | HR 106 | Temp 97.7°F | Ht 68.0 in | Wt 199.2 lb

## 2020-02-29 DIAGNOSIS — E785 Hyperlipidemia, unspecified: Secondary | ICD-10-CM | POA: Diagnosis not present

## 2020-02-29 DIAGNOSIS — I10 Essential (primary) hypertension: Secondary | ICD-10-CM | POA: Diagnosis not present

## 2020-02-29 DIAGNOSIS — E119 Type 2 diabetes mellitus without complications: Secondary | ICD-10-CM

## 2020-02-29 DIAGNOSIS — M170 Bilateral primary osteoarthritis of knee: Secondary | ICD-10-CM

## 2020-02-29 DIAGNOSIS — H6123 Impacted cerumen, bilateral: Secondary | ICD-10-CM

## 2020-02-29 DIAGNOSIS — H9192 Unspecified hearing loss, left ear: Secondary | ICD-10-CM

## 2020-02-29 LAB — ALT: ALT: 30 U/L (ref 0–53)

## 2020-02-29 LAB — AST: AST: 23 U/L (ref 0–37)

## 2020-02-29 LAB — BASIC METABOLIC PANEL
BUN: 14 mg/dL (ref 6–23)
CO2: 33 mEq/L — ABNORMAL HIGH (ref 19–32)
Calcium: 9.8 mg/dL (ref 8.4–10.5)
Chloride: 96 mEq/L (ref 96–112)
Creatinine, Ser: 0.9 mg/dL (ref 0.40–1.50)
GFR: 82.78 mL/min (ref >60.00)
Glucose, Bld: 114 mg/dL — ABNORMAL HIGH (ref 70–99)
Potassium: 3.1 mEq/L — ABNORMAL LOW (ref 3.5–5.1)
Sodium: 139 mEq/L (ref 135–145)

## 2020-02-29 LAB — LIPID PANEL
Cholesterol: 202 mg/dL — ABNORMAL HIGH (ref 0–200)
HDL: 33.4 mg/dL — ABNORMAL LOW (ref >39.00)
Total CHOL/HDL Ratio: 6
Triglycerides: 407 mg/dL — ABNORMAL HIGH (ref 0.0–149.0)

## 2020-02-29 LAB — MICROALBUMIN / CREATININE URINE RATIO
Creatinine,U: 119.6 mg/dL
Microalb Creat Ratio: 1.1 mg/g (ref 0.0–30.0)
Microalb, Ur: 1.3 mg/dL (ref 0.0–1.9)

## 2020-02-29 LAB — HEMOGLOBIN A1C: Hgb A1c MFr Bld: 6.5 % (ref 4.6–6.5)

## 2020-02-29 LAB — LDL CHOLESTEROL, DIRECT: Direct LDL: 128 mg/dL

## 2020-02-29 MED ORDER — OMEGA-3-ACID ETHYL ESTERS 1 G PO CAPS: 1.0000 g | ORAL_CAPSULE | Freq: Four times a day (QID) | ORAL | 3 refills | Status: DC

## 2020-02-29 MED ORDER — FENOFIBRATE 54 MG PO TABS: ORAL_TABLET | ORAL | 3 refills | Status: DC

## 2020-02-29 MED ORDER — METOPROLOL TARTRATE 50 MG PO TABS: 50.0000 mg | ORAL_TABLET | Freq: Two times a day (BID) | ORAL | 3 refills | Status: DC

## 2020-02-29 MED ORDER — CHLORTHALIDONE 25 MG PO TABS: 25.0000 mg | ORAL_TABLET | Freq: Every day | ORAL | 3 refills | Status: DC

## 2020-02-29 MED ORDER — METFORMIN HCL ER 500 MG PO TB24: 500.0000 mg | ORAL_TABLET | Freq: Every evening | ORAL | 3 refills | Status: DC

## 2020-02-29 NOTE — Progress Notes (Signed)
Richard Harding is a 74 y.o. male  Chief Complaint  Patient presents with  . Follow-up    pt needs med refills on fenofibrate, metoprolol, metformin, chlorthalidone//pt brought a list//pt isn't fasting but asking about labs//f  . Pain    pt stated both knees are hurting but right is very painful//hes having knee surgery soon    HPI: Richard Harding is a 74 y.o. male here for a TOC appt from previous PCP Dr. Deborra Medina. He was last seen by her on 01/12/19, last labs done at that time. He has a PMHx significant for HTN, hyperlipidemia, controlled type 2 DM. He needs refills of all meds.  He complains of B/L knee pain Rt > Lt. He states knee replacement is planned soon. Pt has been going to [facility] Dr. Alvan Dame but has not been seen in 2-3 years. He was seen x 1 at [facility] and does not want to return there. He would like referral to ortho who I would recommend.   He had shringrix #1 in 11/2018, needs #2. He had covid #1 and has appt for #2 in 2 days.  Last colonoscopy in 2011 as per pt so he would be due.   Past Medical History:  Diagnosis Date  . Chickenpox   . Hyperlipemia   . Hypertension     Past Surgical History:  Procedure Laterality Date  . NECK SURGERY      Social History   Socioeconomic History  . Marital status: Single    Spouse name: Not on file  . Number of children: Not on file  . Years of education: Not on file  . Highest education level: Not on file  Occupational History  . Not on file  Tobacco Use  . Smoking status: Never Smoker  . Smokeless tobacco: Never Used  Substance and Sexual Activity  . Alcohol use: No  . Drug use: No  . Sexual activity: Not on file  Other Topics Concern  . Not on file  Social History Narrative  . Not on file   Social Determinants of Health   Financial Resource Strain:   . Difficulty of Paying Living Expenses:   Food Insecurity:   . Worried About Charity fundraiser in the Last Year:   . Arboriculturist in  the Last Year:   Transportation Needs:   . Film/video editor (Medical):   Marland Kitchen Lack of Transportation (Non-Medical):   Physical Activity:   . Days of Exercise per Week:   . Minutes of Exercise per Session:   Stress:   . Feeling of Stress :   Social Connections:   . Frequency of Communication with Friends and Family:   . Frequency of Social Gatherings with Friends and Family:   . Attends Religious Services:   . Active Member of Clubs or Organizations:   . Attends Archivist Meetings:   Marland Kitchen Marital Status:   Intimate Partner Violence:   . Fear of Current or Ex-Partner:   . Emotionally Abused:   Marland Kitchen Physically Abused:   . Sexually Abused:     History reviewed. No pertinent family history.   Immunization History  Administered Date(s) Administered  . Influenza, High Dose Seasonal PF 09/16/2015, 09/14/2017, 08/31/2019  . Influenza-Unspecified 12/01/2018  . Moderna SARS-COVID-2 Vaccination 02/03/2020  . Pneumococcal Conjugate-13 09/16/2015  . Pneumococcal Polysaccharide-23 01/18/2014  . Tdap 01/17/2012  . Zoster 02/21/2014  . Zoster Recombinat (Shingrix) 11/25/2018, 05/28/2019    Outpatient Encounter Medications  as of 02/29/2020  Medication Sig  . Ascorbic Acid (VITAMIN C) 1000 MG tablet Take 2,000 mg by mouth daily.  . ASPIRIN PO Take by mouth daily. 75 mg (1/4 of the 325 mg)  . b complex vitamins tablet Take 1 tablet by mouth daily.   . blood glucose meter kit and supplies KIT Check blood glucose once daily. E11.9.  Marland Kitchen chlorthalidone (HYGROTON) 25 MG tablet Take 1 tablet (25 mg total) by mouth daily.  . fenofibrate 54 MG tablet Take 1qd (plz schedule virtual visit for January)  . Flaxseed, Linseed, (FLAX SEED OIL PO) Take 3 capsules by mouth daily. 1400 mg each  . GLUCOSA-CHONDR-NA CHONDR-MSM PO Take 2 capsules by mouth daily.  . metFORMIN (GLUCOPHAGE-XR) 500 MG 24 hr tablet Take 1 tablet (500 mg total) by mouth every evening.  . metoprolol tartrate (LOPRESSOR) 50 MG  tablet Take 1 tablet (50 mg total) by mouth 2 (two) times daily.  . Multiple Vitamin (MULTIVITAMIN) tablet Take 1 tablet by mouth daily.  . niacin 500 MG tablet Take 1 tablet (500 mg total) by mouth daily.  Marland Kitchen omega-3 acid ethyl esters (LOVAZA) 1 g capsule Take 1 capsule (1 g total) by mouth 4 (four) times daily.  . Red Yeast Rice 600 MG CAPS Take 1 capsule (600 mg total) by mouth daily.  . saw palmetto 500 MG capsule Take 500 mg by mouth daily.  . [DISCONTINUED] chlorthalidone (HYGROTON) 25 MG tablet Take 1 tablet (25 mg total) by mouth daily.  . [DISCONTINUED] fenofibrate 54 MG tablet Take 1qd (plz schedule virtual visit for January)  . [DISCONTINUED] metFORMIN (GLUCOPHAGE-XR) 500 MG 24 hr tablet Take 1 tablet (500 mg total) by mouth every evening.  . [DISCONTINUED] metoprolol tartrate (LOPRESSOR) 50 MG tablet Take 1 tablet (50 mg total) by mouth 2 (two) times daily.  . [DISCONTINUED] omega-3 acid ethyl esters (LOVAZA) 1 g capsule Take 1 capsule (1 g total) by mouth 4 (four) times daily.   No facility-administered encounter medications on file as of 02/29/2020.     ROS: Pertinent positives and negatives noted in HPI. Remainder of ROS non-contributory   No Known Allergies  BP 130/82 (BP Location: Right Arm, Patient Position: Sitting, Cuff Size: Normal)   Pulse (!) 106   Temp 97.7 F (36.5 C) (Tympanic)   Ht 5' 8"  (1.727 m)   Wt 199 lb 3.2 oz (90.4 kg)   SpO2 95%   BMI 30.29 kg/m   BP Readings from Last 3 Encounters:  02/29/20 130/82  01/12/19 128/74  06/09/18 130/64   Pulse Readings from Last 3 Encounters:  02/29/20 (!) 106  01/12/19 72  06/09/18 76    Physical Exam  Constitutional: He is oriented to person, place, and time. He appears well-developed and well-nourished. No distress.  Cardiovascular: Normal rate, regular rhythm and intact distal pulses.  Pulmonary/Chest: Effort normal and breath sounds normal. No respiratory distress.  Musculoskeletal:     Cervical back:  Neck supple.  Neurological: He is alert and oriented to person, place, and time.  Skin: Skin is warm and dry.  Psychiatric: He has a normal mood and affect. His behavior is normal.     A/P:   1. Essential hypertension - controlled, at goal - Basic metabolic panel Rx: - metoprolol tartrate (LOPRESSOR) 50 MG tablet; Take 1 tablet (50 mg total) by mouth 2 (two) times daily.  Dispense: 180 tablet; Refill: 3 - chlorthalidone (HYGROTON) 25 MG tablet; Take 1 tablet (25 mg total) by mouth daily.  Dispense: 90 tablet; Refill: 3  2. Type 2 diabetes mellitus without complication, without long-term current use of insulin (HCC) - cont ASA - Hemoglobin A1c - Microalbumin / creatinine urine ratio Refill: - metFORMIN (GLUCOPHAGE-XR) 500 MG 24 hr tablet; Take 1 tablet (500 mg total) by mouth every evening.  Dispense: 90 tablet; Refill: 3 - f/u q27moor sooner PRN  3. Hyperlipidemia LDL goal <70 - ALT - AST - Lipid panel Rx: - fenofibrate 54 MG tablet; Take 1qd (plz schedule virtual visit for January)  Dispense: 90 tablet; Refill: 3 - omega-3 acid ethyl esters (LOVAZA) 1 g capsule; Take 1 capsule (1 g total) by mouth 4 (four) times daily.  Dispense: 360 capsule; Refill: 3  4. Primary osteoarthritis of both knees - Ambulatory referral to Orthopedic Surgery - pt would like cortisone injection and will schedule appt with me next week to have that done here in my office.   5. Hearing loss of left ear, unspecified hearing loss type - Ambulatory referral to ENT  6. Cerumen impaction B/L - will RTO to have ear lavage  This visit occurred during the SARS-CoV-2 public health emergency.  Safety protocols were in place, including screening questions prior to the visit, additional usage of staff PPE, and extensive cleaning of exam room while observing appropriate contact time as indicated for disinfecting solutions.

## 2020-03-01 ENCOUNTER — Encounter: Payer: Self-pay | Admitting: Family Medicine

## 2020-03-01 ENCOUNTER — Other Ambulatory Visit: Payer: Self-pay | Admitting: Family Medicine

## 2020-03-01 DIAGNOSIS — E876 Hypokalemia: Secondary | ICD-10-CM

## 2020-03-01 MED ORDER — POTASSIUM CHLORIDE ER 10 MEQ PO TBCR: 10.0000 meq | EXTENDED_RELEASE_TABLET | Freq: Every day | ORAL | 0 refills | Status: DC

## 2020-03-06 ENCOUNTER — Ambulatory Visit (INDEPENDENT_AMBULATORY_CARE_PROVIDER_SITE_OTHER): Payer: Medicare Other | Admitting: Family Medicine

## 2020-03-06 ENCOUNTER — Encounter: Payer: Self-pay | Admitting: Family Medicine

## 2020-03-06 ENCOUNTER — Ambulatory Visit: Payer: Medicare Other

## 2020-03-06 VITALS — BP 132/70 | HR 70 | Temp 97.7°F | Ht 68.0 in | Wt 197.8 lb

## 2020-03-06 DIAGNOSIS — M170 Bilateral primary osteoarthritis of knee: Secondary | ICD-10-CM | POA: Diagnosis not present

## 2020-03-06 DIAGNOSIS — H6123 Impacted cerumen, bilateral: Secondary | ICD-10-CM | POA: Diagnosis not present

## 2020-03-06 NOTE — Progress Notes (Signed)
Richard Harding is a 74 y.o. male  Chief Complaint  Patient presents with  . Cerumen Impaction    ear irrigation, and cortisone shot.     HPI: Richard Harding is a 74 y.o. male  1. B/L cerumen impaction and would like to have ears flushed to remove wax 2. Pt with B/L knee OA. Rt > Lt. Has been following on/off with ortho x years but at last OV with me 1 week ago requested referral to new ortho to discuss knee replacement. He RTO today requesting cortisone injections B/L knees.  Past Medical History:  Diagnosis Date  . Chickenpox   . Hyperlipemia   . Hypertension     Past Surgical History:  Procedure Laterality Date  . NECK SURGERY      Social History   Socioeconomic History  . Marital status: Single    Spouse name: Not on file  . Number of children: Not on file  . Years of education: Not on file  . Highest education level: Not on file  Occupational History  . Not on file  Tobacco Use  . Smoking status: Never Smoker  . Smokeless tobacco: Never Used  Substance and Sexual Activity  . Alcohol use: No  . Drug use: No  . Sexual activity: Not on file  Other Topics Concern  . Not on file  Social History Narrative  . Not on file   Social Determinants of Health   Financial Resource Strain:   . Difficulty of Paying Living Expenses:   Food Insecurity:   . Worried About Charity fundraiser in the Last Year:   . Arboriculturist in the Last Year:   Transportation Needs:   . Film/video editor (Medical):   Marland Kitchen Lack of Transportation (Non-Medical):   Physical Activity:   . Days of Exercise per Week:   . Minutes of Exercise per Session:   Stress:   . Feeling of Stress :   Social Connections:   . Frequency of Communication with Friends and Family:   . Frequency of Social Gatherings with Friends and Family:   . Attends Religious Services:   . Active Member of Clubs or Organizations:   . Attends Archivist Meetings:   Marland Kitchen Marital Status:   Intimate  Partner Violence:   . Fear of Current or Ex-Partner:   . Emotionally Abused:   Marland Kitchen Physically Abused:   . Sexually Abused:     History reviewed. No pertinent family history.   Immunization History  Administered Date(s) Administered  . Influenza, High Dose Seasonal PF 09/16/2015, 09/14/2017, 08/31/2019  . Influenza-Unspecified 12/01/2018  . Moderna SARS-COVID-2 Vaccination 02/03/2020  . Pneumococcal Conjugate-13 09/16/2015  . Pneumococcal Polysaccharide-23 01/18/2014  . Tdap 01/17/2012  . Zoster 02/21/2014  . Zoster Recombinat (Shingrix) 11/25/2018, 05/28/2019    Outpatient Encounter Medications as of 03/06/2020  Medication Sig  . Ascorbic Acid (VITAMIN C) 1000 MG tablet Take 2,000 mg by mouth daily.  . ASPIRIN PO Take by mouth daily. 75 mg (1/4 of the 325 mg)  . b complex vitamins tablet Take 1 tablet by mouth daily.   . blood glucose meter kit and supplies KIT Check blood glucose once daily. E11.9.  Marland Kitchen chlorthalidone (HYGROTON) 25 MG tablet Take 1 tablet (25 mg total) by mouth daily.  . fenofibrate 54 MG tablet Take 1qd (plz schedule virtual visit for January)  . Flaxseed, Linseed, (FLAX SEED OIL PO) Take 3 capsules by mouth daily. 1400 mg each  .  GLUCOSA-CHONDR-NA CHONDR-MSM PO Take 2 capsules by mouth daily.  . metFORMIN (GLUCOPHAGE-XR) 500 MG 24 hr tablet Take 1 tablet (500 mg total) by mouth every evening.  . metoprolol tartrate (LOPRESSOR) 50 MG tablet Take 1 tablet (50 mg total) by mouth 2 (two) times daily.  . Multiple Vitamin (MULTIVITAMIN) tablet Take 1 tablet by mouth daily.  . niacin 500 MG tablet Take 1 tablet (500 mg total) by mouth daily.  Marland Kitchen omega-3 acid ethyl esters (LOVAZA) 1 g capsule Take 1 capsule (1 g total) by mouth 4 (four) times daily.  . Red Yeast Rice 600 MG CAPS Take 1 capsule (600 mg total) by mouth daily.  . saw palmetto 500 MG capsule Take 500 mg by mouth daily.  . potassium chloride (KLOR-CON) 10 MEQ tablet Take 1 tablet (10 mEq total) by mouth daily  for 4 days.   No facility-administered encounter medications on file as of 03/06/2020.     ROS: Pertinent positives and negatives noted in HPI. Remainder of ROS non-contributory  No Known Allergies  BP 132/70   Pulse 70   Temp 97.7 F (36.5 C) (Tympanic)   Ht 5' 8" (1.727 m)   Wt 197 lb 12.8 oz (89.7 kg)   SpO2 94%   BMI 30.08 kg/m   Physical Exam  Constitutional: He appears well-developed and well-nourished. No distress.  HENT:  B/L cerumen impaction  Musculoskeletal:     Right knee: No swelling or effusion. Decreased range of motion. Tenderness present.     Left knee: No swelling or effusion. Decreased range of motion. Tenderness present.     Comments: B/L knee crepitus  Psychiatric: He has a normal mood and affect. His behavior is normal.   After obtaining informed consent, B/L lateral knees cleaned with alcohol wipe x 3. Ethyl chloride sprayed onto Lt lateral knee. 45m methylprednisolone 477mand 73m673mido w/o epi injected via lateral approach into Lt knee. Area covered with bandaid. Ethyl chloride sprayed onto Rt lateral knee. 1mL31mthylprednisolone 40mg23m 73mL l73m w/o epi injected via lateral approach into Lt knee. Area covered with bandaid. Pt tolerated w/o issue.  A/P:  1. Bilateral impacted cerumen - Ear Lavage - pt tolerated without issue - lighted curette used to extract larger piece of cerumen from Rt ear, Lt ear cerumen was softened but not much was removed - cautioned pt against the use of q-tips and recommended peroxide use 2-3x/wk - he has appt with ENT for hearing loss next week  2. Primary osteoarthritis of both knees - B/L knee injections as above - f/u with ortho as previously referred to discuss knee replacement or other treatment modalities   This visit occurred during the SARS-CoV-2 public health emergency.  Safety protocols were in place, including screening questions prior to the visit, additional usage of staff PPE, and extensive cleaning of exam  room while observing appropriate contact time as indicated for disinfecting solutions.

## 2020-03-12 ENCOUNTER — Ambulatory Visit (INDEPENDENT_AMBULATORY_CARE_PROVIDER_SITE_OTHER): Payer: Medicare Other | Admitting: Otolaryngology

## 2020-03-12 ENCOUNTER — Other Ambulatory Visit: Payer: Self-pay

## 2020-03-12 ENCOUNTER — Encounter (INDEPENDENT_AMBULATORY_CARE_PROVIDER_SITE_OTHER): Payer: Self-pay | Admitting: Otolaryngology

## 2020-03-12 VITALS — Temp 97.7°F

## 2020-03-12 DIAGNOSIS — H903 Sensorineural hearing loss, bilateral: Secondary | ICD-10-CM

## 2020-03-12 DIAGNOSIS — H6123 Impacted cerumen, bilateral: Secondary | ICD-10-CM

## 2020-03-12 DIAGNOSIS — H9313 Tinnitus, bilateral: Secondary | ICD-10-CM | POA: Diagnosis not present

## 2020-03-12 NOTE — Progress Notes (Signed)
HPI: Richard Harding is a 74 y.o. male who presents is referred by by his PCP for evaluation of hearing.  Patient complains of decreased hearing predominately in the left ear.  On recent exam he is noted to have a large amount of wax in both ears..  Past Medical History:  Diagnosis Date  . Chickenpox   . Hyperlipemia   . Hypertension    Past Surgical History:  Procedure Laterality Date  . NECK SURGERY     Social History   Socioeconomic History  . Marital status: Single    Spouse name: Not on file  . Number of children: Not on file  . Years of education: Not on file  . Highest education level: Not on file  Occupational History  . Not on file  Tobacco Use  . Smoking status: Never Smoker  . Smokeless tobacco: Never Used  Substance and Sexual Activity  . Alcohol use: No  . Drug use: No  . Sexual activity: Not on file  Other Topics Concern  . Not on file  Social History Narrative  . Not on file   Social Determinants of Health   Financial Resource Strain:   . Difficulty of Paying Living Expenses:   Food Insecurity:   . Worried About Charity fundraiser in the Last Year:   . Arboriculturist in the Last Year:   Transportation Needs:   . Film/video editor (Medical):   Marland Kitchen Lack of Transportation (Non-Medical):   Physical Activity:   . Days of Exercise per Week:   . Minutes of Exercise per Session:   Stress:   . Feeling of Stress :   Social Connections:   . Frequency of Communication with Friends and Family:   . Frequency of Social Gatherings with Friends and Family:   . Attends Religious Services:   . Active Member of Clubs or Organizations:   . Attends Archivist Meetings:   Marland Kitchen Marital Status:    No family history on file. No Known Allergies Prior to Admission medications   Medication Sig Start Date End Date Taking? Authorizing Provider  Ascorbic Acid (VITAMIN C) 1000 MG tablet Take 2,000 mg by mouth daily.   Yes [provider]  ASPIRIN  PO Take by mouth daily. 75 mg (1/4 of the 325 mg)   Yes [provider]  b complex vitamins tablet Take 1 tablet by mouth daily.    Yes [provider]  blood glucose meter kit and supplies KIT Check blood glucose once daily. E11.9. 01/12/19  Yes Lucille Passy, MD  chlorthalidone (HYGROTON) 25 MG tablet Take 1 tablet (25 mg total) by mouth daily. 02/29/20  Yes CiriglianoGarvin Fila, DO  fenofibrate 54 MG tablet Take 1qd (plz schedule virtual visit for January) 02/29/20  Yes Cirigliano, Mary K, DO  Flaxseed, Linseed, (FLAX SEED OIL PO) Take 3 capsules by mouth daily. 1400 mg each   Yes [provider]  GLUCOSA-CHONDR-NA CHONDR-MSM PO Take 2 capsules by mouth daily.   Yes [provider]  metFORMIN (GLUCOPHAGE-XR) 500 MG 24 hr tablet Take 1 tablet (500 mg total) by mouth every evening. 02/29/20  Yes Cirigliano, Mary K, DO  metoprolol tartrate (LOPRESSOR) 50 MG tablet Take 1 tablet (50 mg total) by mouth 2 (two) times daily. 02/29/20  Yes Cirigliano, Garvin Fila, DO  Multiple Vitamin (MULTIVITAMIN) tablet Take 1 tablet by mouth daily.   Yes [provider]  niacin 500 MG tablet Take 1  tablet (500 mg total) by mouth daily. 01/12/19  Yes Lucille Passy, MD  omega-3 acid ethyl esters (LOVAZA) 1 g capsule Take 1 capsule (1 g total) by mouth 4 (four) times daily. 02/29/20  Yes Cirigliano, Mary K, DO  Red Yeast Rice 600 MG CAPS Take 1 capsule (600 mg total) by mouth daily. 09/14/17  Yes Roma Schanz R, DO  saw palmetto 500 MG capsule Take 500 mg by mouth daily.   Yes [provider]  potassium chloride (KLOR-CON) 10 MEQ tablet Take 1 tablet (10 mEq total) by mouth daily for 4 days. 03/01/20 03/05/20  Cirigliano, Garvin Fila, DO     Positive ROS: Otherwise negative  All other systems have been reviewed and were otherwise negative with the exception of those mentioned in the HPI and as above.  Physical Exam: Constitutional: Alert, well-appearing, no acute  distress Ears: External ears without lesions or tenderness.  Patient had a large amount of wax on both sides.  The left ear was especially obstructed with wax down adjacent to the TM.  Apparently patient has been using Q-tips.  He has never had his ears cleaned.  Wax was removed with suction and irrigation from both sides.  The TMs were clear bilaterally. Nasal: External nose without lesions. Septum midline with mild rhinitis.. Clear nasal passages Oral: Lips and gums without lesions. Tongue and palate mucosa without lesions. Posterior oropharynx clear. Neck: No palpable adenopathy or masses Respiratory: Breathing comfortably  Skin: No facial/neck lesions or rash noted.  Audiogram was performed in the office today and on audiologic testing he had symmetric hearing in both ears with type A tympanograms bilaterally.  He had symmetric hearing with a mild downsloping sensorineural hearing loss above 2000 frequency in both ears.  SRT's were 25 dB bilaterally.  Cerumen impaction removal  Date/Time: 03/12/2020 6:53 PM Performed by: Rozetta Nunnery, MD Authorized by: Rozetta Nunnery, MD   Consent:    Consent obtained:  Verbal   Consent given by:  Patient   Risks discussed:  Pain and bleeding Procedure details:    Location:  L ear and R ear   Procedure type: curette and suction   Post-procedure details:    Inspection:  TM intact and canal normal   Hearing quality:  Improved   Patient tolerance of procedure:  Tolerated well, no immediate complications Comments:     TMs were clear bilaterally.    Assessment: Bilateral cerumen impactions worse on the left side. 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 His hearing was much better after cleaning the wax from his ear canals. Cautioned him about using  any Q-tips in his ear. Recommended having his ears cleaned once a year.Radene Journey, MD   CC:

## 2020-03-13 ENCOUNTER — Ambulatory Visit: Payer: Self-pay

## 2020-03-13 ENCOUNTER — Encounter (INDEPENDENT_AMBULATORY_CARE_PROVIDER_SITE_OTHER): Payer: Self-pay | Admitting: Otolaryngology

## 2020-03-13 ENCOUNTER — Other Ambulatory Visit: Payer: Self-pay

## 2020-03-13 ENCOUNTER — Ambulatory Visit: Payer: Medicare Other | Admitting: Orthopaedic Surgery

## 2020-03-13 VITALS — Ht 69.0 in | Wt 200.0 lb

## 2020-03-13 DIAGNOSIS — M1711 Unilateral primary osteoarthritis, right knee: Secondary | ICD-10-CM

## 2020-03-13 DIAGNOSIS — M25562 Pain in left knee: Secondary | ICD-10-CM

## 2020-03-13 DIAGNOSIS — M1712 Unilateral primary osteoarthritis, left knee: Secondary | ICD-10-CM | POA: Diagnosis not present

## 2020-03-13 DIAGNOSIS — M25561 Pain in right knee: Secondary | ICD-10-CM | POA: Diagnosis not present

## 2020-03-13 NOTE — Progress Notes (Signed)
Office Visit Note   Patient: Richard Harding           Date of Birth: 24-Jul-1946           MRN: [id number] Visit Date: 03/13/2020              Requested by: Ronnald Nian, DO [place],  [place] [id number] PCP: Ronnald Nian, DO   Assessment & Plan: Visit Diagnoses:  1. Left knee pain, unspecified chronicity   2. Right knee pain, unspecified chronicity   3. Unilateral primary osteoarthritis, left knee   4. Unilateral primary osteoarthritis, right knee     Plan: At this point he has tried and exhausted all forms of conservative treatment for his knees for well over a year now.  His arthritis is severe enough to the point we are recommending knee replacement surgery and he is requesting this as well.  I had a long and thorough discussion with him about knee replacement surgery.  We talked about his interoperative and postoperative course.  I discussed in detail what the surgery involves showing him a knee model.  I went over his x-rays in detail we discussed the risk and benefits of surgery.  He is someone who lives alone and will need short-term skilled nursing placement after surgery to help rehabilitate him.  He has no family and friends around to help with this.  All questions and concerns were answered and addressed.  We will work on getting him scheduled for right total knee arthroplasty at the first of a month so he is able to pay his own bills and not worry about that he states as he is recovering from surgery.  Follow-Up Instructions: Return for 2 weeks post-op.   Orders:  Orders Placed This Encounter  Procedures  . XR Knee 1-2 Views Left  . XR Knee 1-2 Views Right   No orders of the defined types were placed in this encounter.     Procedures: No procedures performed   Clinical Data: No additional findings.   Subjective: Chief Complaint  Patient presents with  . Left Knee - Pain  . Right Knee - Pain  The patient is a very  pleasant 74 year old gentleman who comes in for evaluation treatment of severe arthritis of both his knees.  This has been hurting for many years now.  He ambulates with a cane.  His right knee is worse than his left.  He lives alone.  Most of his pain occurs with walking and activities day living.  At this point is daily pain is 10 out of 10.  Also his knees have detriment affecting his mobility, his quality of life and his actives daily living.  He has had physical therapy on his legs to strengthen his quads.  He has had steroid injections as well.  This point he has tried and failed all forms conservative treatment for many years now.  He is requesting a knee replacement surgery release his right knee.  He also wears knee sleeves when he can.  He is a type II diabetic.  His most recent hemoglobin A1c is 6.5.  He is not a smoker.  He is not obese.  HPI  Review of Systems He currently denies any headache, chest pain, shortness of breath, fever, chills, nausea, vomiting  Objective: Vital Signs: Ht 5\' 9"  (1.753 m)   Wt 200 lb (90.7 kg)   BMI 29.53 kg/m   Physical Exam He is alert and  orient x3 and in no acute distress Ortho Exam Examination of both knees shows varus malalignment of both knees.  Neither knee has an effusion.  Both knees have significant medial joint line tenderness and patellofemoral crepitation.  Both knees have painful arc of motion but are ligamentously stable. Specialty Comments:  No specialty comments available.  Imaging: XR Knee 1-2 Views Left  Result Date: 03/13/2020 2 views of the left knee show severe end-stage arthritis with varus malalignment.  There is complete loss of the medial joint space and patellofemoral joint with large periarticular osteophytes.  XR Knee 1-2 Views Right  Result Date: 03/13/2020 2 views of the right knee show severe end-stage arthritis.  There is varus malalignment.  There is almost complete loss of medial joint space and the  patellofemoral joint.  There is periarticular osteophytes as well.    PMFS History: Patient Active Problem List   Diagnosis Date Noted  . Hypokalemia 01/12/2019  . DM (diabetes mellitus) type II, controlled, with peripheral vascular disorder ([place]) 09/16/2017  . Essential hypertension 01/18/2014  . Hyperlipidemia LDL goal <70 01/18/2014  . Obesity (BMI 30-39.9) 01/18/2014   Past Medical History:  Diagnosis Date  . Chickenpox   . Hyperlipemia   . Hypertension     No family history on file.  Past Surgical History:  Procedure Laterality Date  . NECK SURGERY     Social History   Occupational History  . Not on file  Tobacco Use  . Smoking status: Never Smoker  . Smokeless tobacco: Never Used  Substance and Sexual Activity  . Alcohol use: No  . Drug use: No  . Sexual activity: Not on file

## 2020-04-15 ENCOUNTER — Other Ambulatory Visit: Payer: Self-pay

## 2020-04-15 ENCOUNTER — Encounter (HOSPITAL_COMMUNITY): Payer: Self-pay

## 2020-04-15 NOTE — Patient Instructions (Addendum)
DUE TO COVID-19 ONLY ONE VISITOR ARE ALLOWED TO COME WITH YOU AND STAY IN THE WAITING ROOM ONLY DURING PRE OP AND PROCEDURE. THEN TWO VISITORS MAY VISIT WITH YOU IN YOUR PRIVATE ROOM DURING VISITING HOURS ONLY!!   COVID SWAB TESTING MUST BE COMPLETED ON:   Tuesday, Apr 23, 2020 [place] (Must self quarantine after testing. Follow instructions on handout.)             Your procedure is scheduled on: Friday, Apr 26, 2020   Report to [facility] Main  Entrance    Report to admitting at 8:30 AM   Call this number if you have problems the morning of surgery [phone]   Do not eat food or drink liquids :After Midnight.   Oral Hygiene is also important to reduce your risk of infection.                                    Remember - BRUSH YOUR TEETH THE MORNING OF SURGERY WITH YOUR REGULAR TOOTHPASTE   Do NOT smoke after Midnight   Take these medicines the morning of surgery with A SIP OF WATER: None  DO NOT TAKE ANY ORAL DIABETIC MEDICATIONS DAY OF YOUR SURGERY                               You may not have any metal on your body including jewelry, and body piercings             Do not wear lotions, powders, perfumes/cologne, or deodorant                           Men may shave face and neck.   Do not bring valuables to the hospital. [place].   Contacts, dentures or bridgework may not be worn into surgery.   Bring small overnight bag day of surgery.    Patients discharged the day of surgery will not be allowed to drive home.   Special Instructions: Bring a copy of your healthcare power of attorney and living will documents         the day of surgery if you haven't scanned them in before.              Please read over the following fact sheets you were given: IF YOU HAVE QUESTIONS ABOUT YOUR PRE OP [place]  [phone]  How to Manage Your Diabetes Before and After Surgery  Why is it important to control my blood sugar before and after surgery? . Improving blood sugar levels before and after surgery helps healing and can limit problems. . A way of improving blood sugar control is eating a healthy diet by: o  Eating less sugar and carbohydrates o  Increasing activity/exercise o  Talking with your doctor about reaching your blood sugar goals . High blood sugars (greater than 180 mg/dL) can raise your risk of infections and slow your recovery, so you will need to focus on controlling your diabetes during the weeks before surgery. . Make sure that the doctor who takes care of your diabetes knows about your planned surgery  including the date and location.  How do I manage my blood sugar before surgery? . Check your blood sugar at least 4 times a day, starting 2 days before surgery, to make sure that the level is not too high or low. o Check your blood sugar the morning of your surgery when you wake up and every 2 hours until you get to the Short Stay unit. . If your blood sugar is less than 70 mg/dL, you will need to treat for low blood sugar: o Do not take insulin. o Treat a low blood sugar (less than 70 mg/dL) with  cup of clear juice (cranberry or apple), 4 glucose tablets, OR glucose gel. o Recheck blood sugar in 15 minutes after treatment (to make sure it is greater than 70 mg/dL). If your blood sugar is not greater than 70 mg/dL on recheck, call [phone] for further instructions. . Report your blood sugar to the short stay nurse when you get to Short Stay.  . If you are admitted to the hospital after surgery: o Your blood sugar will be checked by the staff and you will probably be given insulin after surgery (instead of oral diabetes medicines) to make sure you have good blood sugar levels. o The goal for blood sugar control after surgery is 80-180 mg/dL.   WHAT DO I DO ABOUT MY  DIABETES MEDICATION?  Marland Kitchen Do not take oral diabetes medicines (pills) the morning of surgery.   Reviewed and Endorsed by [facility] Patient Education Committee, August 2015  [facility] - Preparing for Surgery Before surgery, you can play an important role.  Because skin is not sterile, your skin needs to be as free of germs as possible.  You can reduce the number of germs on your skin by washing with CHG (chlorahexidine gluconate) soap before surgery.  CHG is an antiseptic cleaner which kills germs and bonds with the skin to continue killing germs even after washing. Please DO NOT use if you have an allergy to CHG or antibacterial soaps.  If your skin becomes reddened/irritated stop using the CHG and inform your nurse when you arrive at Short Stay. Do not shave (including legs and underarms) for at least 48 hours prior to the first CHG shower.  You may shave your face/neck.  Please follow these instructions carefully:  1.  Shower with CHG Soap the night before surgery and the  morning of surgery.  2.  If you choose to wash your hair, wash your hair first as usual with your normal  shampoo.  3.  After you shampoo, rinse your hair and body thoroughly to remove the shampoo.                             4.  Use CHG as you would any other liquid soap.  You can apply chg directly to the skin and wash.  Gently with a scrungie or clean washcloth.  5.  Apply the CHG Soap to your body ONLY FROM THE NECK DOWN.   Do   not use on face/ open                           Wound or open sores. Avoid contact with eyes, ears mouth and   genitals (private parts).  Wash face,  Genitals (private parts) with your normal soap.             6.  Wash thoroughly, paying special attention to the area where your    surgery  will be performed.  7.  Thoroughly rinse your body with warm water from the neck down.  8.  DO NOT shower/wash with your normal soap after using and rinsing off the CHG Soap.                 9.  Pat yourself dry with a clean towel.            10.  Wear clean pajamas.            11.  Place clean sheets on your bed the night of your first shower and do not  sleep with pets. Day of Surgery : Do not apply any lotions/deodorants the morning of surgery.  Please wear clean clothes to the hospital/surgery center.  FAILURE TO FOLLOW THESE INSTRUCTIONS MAY RESULT IN THE CANCELLATION OF YOUR SURGERY  PATIENT SIGNATURE_________________________________  NURSE SIGNATURE__________________________________  ________________________________________________________________________

## 2020-04-17 ENCOUNTER — Encounter (HOSPITAL_COMMUNITY): Payer: Self-pay

## 2020-04-17 ENCOUNTER — Encounter (HOSPITAL_COMMUNITY)
Admission: RE | Admit: 2020-04-17 | Discharge: 2020-04-17 | Disposition: A | Discharge status: Home / Self Care | Payer: Medicare Other | Source: Ambulatory Visit | Attending: Orthopaedic Surgery | Admitting: Orthopaedic Surgery

## 2020-04-17 ENCOUNTER — Other Ambulatory Visit: Payer: Self-pay

## 2020-04-17 DIAGNOSIS — I4510 Unspecified right bundle-branch block: Secondary | ICD-10-CM

## 2020-04-17 DIAGNOSIS — Z01818 Encounter for other preprocedural examination: Secondary | ICD-10-CM | POA: Diagnosis not present

## 2020-04-17 HISTORY — DX: Carpal tunnel syndrome, bilateral upper limbs: G56.03

## 2020-04-17 HISTORY — DX: Type 2 diabetes mellitus without complications: E11.9

## 2020-04-17 HISTORY — DX: Unspecified right bundle-branch block: I45.10

## 2020-04-17 HISTORY — DX: Unspecified osteoarthritis, unspecified site: M19.90

## 2020-04-17 LAB — CBC
HCT: 41.4 % (ref 39.0–52.0)
Hemoglobin: 14.6 g/dL (ref 13.0–17.0)
MCH: 30.4 pg (ref 26.0–34.0)
MCHC: 35.3 g/dL (ref 30.0–36.0)
MCV: 86.1 fL (ref 80.0–100.0)
Platelets: 282 10*3/uL (ref 150–400)
RBC: 4.81 MIL/uL (ref 4.22–5.81)
RDW: 12.1 % (ref 11.5–15.5)
WBC: 7.1 10*3/uL (ref 4.0–10.5)
nRBC: 0 % (ref 0.0–0.2)

## 2020-04-17 LAB — SURGICAL PCR SCREEN
MRSA, PCR: NEGATIVE
Staphylococcus aureus: NEGATIVE

## 2020-04-17 NOTE — Progress Notes (Addendum)
Has completed COVID 19 vaccine series   PCP - Dr. Michelle Piper last office visit 03/06/2020 in epic Cardiologist - N/A  Chest x-ray - N/A EKG - 04/17/20 in epic Stress Test - N/A ECHO - N/A Cardiac Cath - N/A  Sleep Study - N/A CPAP - N/A  Fasting Blood Sugar - N/A Checks Blood Sugar __N/A___ times a day Hgb A1C 6.5 02/29/20 in epic  Blood Thinner Instructions: N/A Aspirin Instructions: Instructed to call Dr. Ninfa Linden for instructions Last Dose:  Anesthesia review:  N/A  Patient denies shortness of breath, fever, cough and chest pain at PAT appointment   Patient verbalized understanding of instructions that were given to them at the PAT appointment. Patient was also instructed that they will need to review over the PAT instructions again at home before surgery.

## 2020-04-18 ENCOUNTER — Encounter (HOSPITAL_COMMUNITY): Payer: Self-pay

## 2020-04-22 ENCOUNTER — Other Ambulatory Visit: Payer: Self-pay | Admitting: Physician Assistant

## 2020-04-23 ENCOUNTER — Other Ambulatory Visit (HOSPITAL_COMMUNITY)
Admission: RE | Admit: 2020-04-23 | Discharge: 2020-04-23 | Disposition: A | Discharge status: Home / Self Care | Payer: Medicare Other | Source: Ambulatory Visit | Attending: Cardiology | Admitting: Cardiology

## 2020-04-23 DIAGNOSIS — Z20822 Contact with and (suspected) exposure to covid-19: Secondary | ICD-10-CM | POA: Diagnosis not present

## 2020-04-23 DIAGNOSIS — Z01812 Encounter for preprocedural laboratory examination: Secondary | ICD-10-CM | POA: Diagnosis not present

## 2020-04-23 LAB — SARS CORONAVIRUS 2 (TAT 6-24 HRS): SARS Coronavirus 2: NEGATIVE

## 2020-04-25 ENCOUNTER — Encounter (HOSPITAL_COMMUNITY): Payer: Self-pay | Admitting: Orthopaedic Surgery

## 2020-04-25 DIAGNOSIS — M1711 Unilateral primary osteoarthritis, right knee: Secondary | ICD-10-CM

## 2020-04-25 HISTORY — DX: Unilateral primary osteoarthritis, right knee: M17.11

## 2020-04-25 NOTE — H&P (Signed)
TOTAL KNEE ADMISSION H&P  Patient is being admitted for right total knee arthroplasty.  Subjective:  Chief Complaint:right knee pain.  HPI: Richard Harding, 74 y.o. male, has a history of pain and functional disability in the right knee due to arthritis and has failed non-surgical conservative treatments for greater than 12 weeks to includeNSAID's and/or analgesics, corticosteriod injections, viscosupplementation injections, flexibility and strengthening excercises, use of assistive devices, weight reduction as appropriate and activity modification.  Onset of symptoms was gradual, starting 3 years ago with gradually worsening course since that time. The patient noted no past surgery on the right knee(s).  Patient currently rates pain in the right knee(s) at 10 out of 10 with activity. Patient has night pain, worsening of pain with activity and weight bearing, pain that interferes with activities of daily living, pain with passive range of motion, crepitus and joint swelling.  Patient has evidence of subchondral sclerosis, periarticular osteophytes and joint space narrowing by imaging studies. There is no active infection.  Patient Active Problem List   Diagnosis Date Noted  . Arthritis of right knee 04/25/2020  . Hypokalemia 01/12/2019  . DM (diabetes mellitus) type II, controlled, with peripheral vascular disorder ([place]) 09/16/2017  . Essential hypertension 01/18/2014  . Hyperlipidemia LDL goal <70 01/18/2014  . Obesity (BMI 30-39.9) 01/18/2014   Past Medical History:  Diagnosis Date  . Arthritis   . Arthritis of right knee 04/25/2020  . Carpal tunnel syndrome, bilateral   . Chickenpox   . Diabetes mellitus without complication ([place])   . Hyperlipemia   . Hypertension   . RBBB 04/17/2020   Noted on EKG    Past Surgical History:  Procedure Laterality Date  . BILATERAL CARPAL TUNNEL RELEASE    . CERVICAL FUSION    . COLONOSCOPY  2011    No current facility-administered medications  for this encounter.   Current Outpatient Medications  Medication Sig Dispense Refill Last Dose  . Ascorbic Acid (VITAMIN C) 1000 MG tablet Take 2,000 mg by mouth at bedtime.      . ASPIRIN PO Take 81 mg by mouth at bedtime.      . chlorthalidone (HYGROTON) 25 MG tablet Take 1 tablet (25 mg total) by mouth daily. 90 tablet 3   . fenofibrate 54 MG tablet Take 1qd (plz schedule virtual visit for January) (Patient taking differently: Take 54 mg by mouth daily. ) 90 tablet 3   . Flaxseed, Linseed, (FLAX SEED OIL PO) Take 1 capsule by mouth daily.      Marland Kitchen GLUCOSA-CHONDR-NA CHONDR-MSM PO Take 2 capsules by mouth at bedtime.      . metFORMIN (GLUCOPHAGE-XR) 500 MG 24 hr tablet Take 1 tablet (500 mg total) by mouth every evening. 90 tablet 3   . metoprolol tartrate (LOPRESSOR) 50 MG tablet Take 1 tablet (50 mg total) by mouth 2 (two) times daily. (Patient taking differently: Take 100 mg by mouth daily. Takes evening only) 180 tablet 3   . niacin 500 MG tablet Take 1 tablet (500 mg total) by mouth daily. 90 tablet 3   . omega-3 acid ethyl esters (LOVAZA) 1 g capsule Take 1 capsule (1 g total) by mouth 4 (four) times daily. 360 capsule 3   . Red Yeast Rice 600 MG CAPS Take 1 capsule (600 mg total) by mouth daily.     . blood glucose meter kit and supplies KIT Check blood glucose once daily. E11.9. 1 each 0   . potassium chloride (KLOR-CON) 10 MEQ tablet  Take 1 tablet (10 mEq total) by mouth daily for 4 days. (Patient not taking: Reported on 04/15/2020) 4 tablet 0 Not Taking at Unknown time   No Known Allergies  Social History   Tobacco Use  . Smoking status: Never Smoker  . Smokeless tobacco: Never Used  Substance Use Topics  . Alcohol use: No    No family history on file.   Review of Systems  Musculoskeletal: Positive for joint swelling.  All other systems reviewed and are negative.   Objective:  Physical Exam  Constitutional: He is oriented to person, place, and time. He appears  well-developed and well-nourished.  HENT:  Head: Normocephalic and atraumatic.  Eyes: Pupils are equal, round, and reactive to light. EOM are normal.  Cardiovascular: Normal rate and regular rhythm.  Respiratory: Effort normal and breath sounds normal.  GI: Soft. Bowel sounds are normal.  Musculoskeletal:     Cervical back: Normal range of motion and neck supple.     Right knee: Swelling, effusion and bony tenderness present. Decreased range of motion. Tenderness present over the medial joint line and lateral joint line. Abnormal alignment and abnormal meniscus.  Neurological: He is alert and oriented to person, place, and time.  Skin: Skin is warm and dry.  Psychiatric: He has a normal mood and affect.    Vital signs in last 24 hours:    Labs:   Estimated body mass index is 28.65 kg/m as calculated from the following:   Height as of 04/17/20: 5' 9" (1.753 m).   Weight as of 04/17/20: 88 kg.   Imaging Review Plain radiographs demonstrate severe degenerative joint disease of the right knee(s). The overall alignment ismild varus. The bone quality appears to be good for age and reported activity level.      Assessment/Plan:  End stage arthritis, right knee   The patient history, physical examination, clinical judgment of the provider and imaging studies are consistent with end stage degenerative joint disease of the right knee(s) and total knee arthroplasty is deemed medically necessary. The treatment options including medical management, injection therapy arthroscopy and arthroplasty were discussed at length. The risks and benefits of total knee arthroplasty were presented and reviewed. The risks due to aseptic loosening, infection, stiffness, patella tracking problems, thromboembolic complications and other imponderables were discussed. The patient acknowledged the explanation, agreed to proceed with the plan and consent was signed. Patient is being admitted for inpatient treatment  for surgery, pain control, PT, OT, prophylactic antibiotics, VTE prophylaxis, progressive ambulation and ADL's and discharge planning. The patient is planning to be discharged home with home health services     

## 2020-04-26 ENCOUNTER — Inpatient Hospital Stay (HOSPITAL_COMMUNITY)
Admission: AD | Admit: 2020-04-26 | Discharge: 2020-04-30 | DRG: 470 | Disposition: A | Discharge status: Skilled Nursing Facility | Payer: Medicare Other | Attending: Orthopaedic Surgery | Admitting: Orthopaedic Surgery

## 2020-04-26 ENCOUNTER — Ambulatory Visit (HOSPITAL_COMMUNITY): Payer: Medicare Other | Admitting: Anesthesiology

## 2020-04-26 ENCOUNTER — Other Ambulatory Visit: Payer: Self-pay

## 2020-04-26 ENCOUNTER — Encounter (HOSPITAL_COMMUNITY): Payer: Self-pay | Admitting: Orthopaedic Surgery

## 2020-04-26 ENCOUNTER — Encounter (HOSPITAL_COMMUNITY)
Admission: AD | Disposition: A | Discharge status: Skilled Nursing Facility | Payer: Self-pay | Source: Home / Self Care | Attending: Orthopaedic Surgery

## 2020-04-26 ENCOUNTER — Observation Stay (HOSPITAL_COMMUNITY): Payer: Medicare Other

## 2020-04-26 DIAGNOSIS — M25761 Osteophyte, right knee: Secondary | ICD-10-CM | POA: Diagnosis present

## 2020-04-26 DIAGNOSIS — Z7984 Long term (current) use of oral hypoglycemic drugs: Secondary | ICD-10-CM

## 2020-04-26 DIAGNOSIS — E1151 Type 2 diabetes mellitus with diabetic peripheral angiopathy without gangrene: Secondary | ICD-10-CM | POA: Diagnosis not present

## 2020-04-26 DIAGNOSIS — Z471 Aftercare following joint replacement surgery: Secondary | ICD-10-CM | POA: Diagnosis not present

## 2020-04-26 DIAGNOSIS — Z96651 Presence of right artificial knee joint: Secondary | ICD-10-CM

## 2020-04-26 DIAGNOSIS — Z7982 Long term (current) use of aspirin: Secondary | ICD-10-CM

## 2020-04-26 DIAGNOSIS — Z6828 Body mass index (BMI) 28.0-28.9, adult: Secondary | ICD-10-CM

## 2020-04-26 DIAGNOSIS — Z79899 Other long term (current) drug therapy: Secondary | ICD-10-CM

## 2020-04-26 DIAGNOSIS — M1711 Unilateral primary osteoarthritis, right knee: Secondary | ICD-10-CM

## 2020-04-26 DIAGNOSIS — E785 Hyperlipidemia, unspecified: Secondary | ICD-10-CM | POA: Diagnosis present

## 2020-04-26 DIAGNOSIS — Z981 Arthrodesis status: Secondary | ICD-10-CM

## 2020-04-26 DIAGNOSIS — E669 Obesity, unspecified: Secondary | ICD-10-CM | POA: Diagnosis present

## 2020-04-26 DIAGNOSIS — M170 Bilateral primary osteoarthritis of knee: Secondary | ICD-10-CM | POA: Diagnosis not present

## 2020-04-26 DIAGNOSIS — Z20822 Contact with and (suspected) exposure to covid-19: Secondary | ICD-10-CM | POA: Diagnosis present

## 2020-04-26 DIAGNOSIS — I10 Essential (primary) hypertension: Secondary | ICD-10-CM | POA: Diagnosis not present

## 2020-04-26 DIAGNOSIS — I4510 Unspecified right bundle-branch block: Secondary | ICD-10-CM | POA: Diagnosis not present

## 2020-04-26 DIAGNOSIS — G8918 Other acute postprocedural pain: Secondary | ICD-10-CM | POA: Diagnosis not present

## 2020-04-26 HISTORY — PX: TOTAL KNEE ARTHROPLASTY: SHX125

## 2020-04-26 HISTORY — DX: Presence of right artificial knee joint: Z96.651

## 2020-04-26 LAB — GLUCOSE, CAPILLARY
Glucose-Capillary: 126 mg/dL — ABNORMAL HIGH (ref 70–99)
Glucose-Capillary: 129 mg/dL — ABNORMAL HIGH (ref 70–99)
Glucose-Capillary: 152 mg/dL — ABNORMAL HIGH (ref 70–99)

## 2020-04-26 LAB — ABO/RH: ABO/RH(D): O NEG

## 2020-04-26 LAB — TYPE AND SCREEN
ABO/RH(D): O NEG
Antibody Screen: NEGATIVE

## 2020-04-26 SURGERY — ARTHROPLASTY, KNEE, TOTAL
Anesthesia: Spinal | Site: Knee | Laterality: Right

## 2020-04-26 MED ORDER — RED YEAST RICE 600 MG PO CAPS: 1.0000 | ORAL_CAPSULE | Freq: Every day | ORAL | Status: DC

## 2020-04-26 MED ORDER — ONDANSETRON HCL 4 MG/2ML IJ SOLN: 4.0000 mg | Freq: Once | INTRAMUSCULAR | Status: DC | PRN

## 2020-04-26 MED ORDER — MIDAZOLAM HCL 2 MG/2ML IJ SOLN
INTRAMUSCULAR | Status: AC
  Administered 2020-04-26: 2 mg via INTRAVENOUS
  Filled 2020-04-26: qty 2

## 2020-04-26 MED ORDER — BUPIVACAINE HCL (PF) 0.25 % IJ SOLN
INTRAMUSCULAR | Status: DC | PRN
  Administered 2020-04-26: 50 mL

## 2020-04-26 MED ORDER — METHOCARBAMOL 1000 MG/10ML IJ SOLN
500.0000 mg | Freq: Four times a day (QID) | INTRAVENOUS | Status: DC | PRN
  Filled 2020-04-26: qty 5

## 2020-04-26 MED ORDER — ACETAMINOPHEN 325 MG PO TABS
325.0000 mg | ORAL_TABLET | Freq: Four times a day (QID) | ORAL | Status: DC | PRN
  Administered 2020-04-27 – 2020-04-29 (×3): 650 mg via ORAL
  Filled 2020-04-26 (×2): qty 2

## 2020-04-26 MED ORDER — STERILE WATER FOR IRRIGATION IR SOLN
Status: DC | PRN
  Administered 2020-04-26: 1000 mL

## 2020-04-26 MED ORDER — PROPOFOL 10 MG/ML IV BOLUS
INTRAVENOUS | Status: AC
  Filled 2020-04-26: qty 20

## 2020-04-26 MED ORDER — CHLORTHALIDONE 25 MG PO TABS
25.0000 mg | ORAL_TABLET | Freq: Every day | ORAL | Status: DC
  Administered 2020-04-27 – 2020-04-30 (×4): 25 mg via ORAL
  Filled 2020-04-26 (×4): qty 1

## 2020-04-26 MED ORDER — FENTANYL CITRATE (PF) 100 MCG/2ML IJ SOLN: 50.0000 ug | INTRAMUSCULAR | Status: DC

## 2020-04-26 MED ORDER — PROPOFOL 500 MG/50ML IV EMUL
INTRAVENOUS | Status: AC
  Filled 2020-04-26: qty 50

## 2020-04-26 MED ORDER — NIACIN 500 MG PO TABS
500.0000 mg | ORAL_TABLET | Freq: Every day | ORAL | Status: DC
  Administered 2020-04-27 – 2020-04-30 (×4): 500 mg via ORAL
  Filled 2020-04-26 (×4): qty 1

## 2020-04-26 MED ORDER — HYDROMORPHONE HCL 1 MG/ML IJ SOLN: 0.2500 mg | INTRAMUSCULAR | Status: DC | PRN

## 2020-04-26 MED ORDER — MIDAZOLAM HCL 2 MG/2ML IJ SOLN
INTRAMUSCULAR | Status: AC
  Filled 2020-04-26: qty 2

## 2020-04-26 MED ORDER — METOCLOPRAMIDE HCL 5 MG/ML IJ SOLN: 5.0000 mg | Freq: Three times a day (TID) | INTRAMUSCULAR | Status: DC | PRN

## 2020-04-26 MED ORDER — METOPROLOL TARTRATE 50 MG PO TABS
50.0000 mg | ORAL_TABLET | Freq: Two times a day (BID) | ORAL | Status: DC
  Administered 2020-04-27 – 2020-04-30 (×7): 50 mg via ORAL
  Filled 2020-04-26 (×8): qty 1

## 2020-04-26 MED ORDER — MEPERIDINE HCL 50 MG/ML IJ SOLN: 6.2500 mg | INTRAMUSCULAR | Status: DC | PRN

## 2020-04-26 MED ORDER — DEXAMETHASONE SODIUM PHOSPHATE 10 MG/ML IJ SOLN
INTRAMUSCULAR | Status: AC
  Filled 2020-04-26: qty 1

## 2020-04-26 MED ORDER — METFORMIN HCL ER 500 MG PO TB24
500.0000 mg | ORAL_TABLET | Freq: Every day | ORAL | Status: DC
  Administered 2020-04-26 – 2020-04-29 (×4): 500 mg via ORAL
  Filled 2020-04-26 (×4): qty 1

## 2020-04-26 MED ORDER — MENTHOL 3 MG MT LOZG: 1.0000 | LOZENGE | OROMUCOSAL | Status: DC | PRN

## 2020-04-26 MED ORDER — PROPOFOL 10 MG/ML IV BOLUS
INTRAVENOUS | Status: DC | PRN
  Administered 2020-04-26 (×2): 20 mg via INTRAVENOUS
  Administered 2020-04-26: 30 mg via INTRAVENOUS

## 2020-04-26 MED ORDER — PANTOPRAZOLE SODIUM 40 MG PO TBEC
40.0000 mg | DELAYED_RELEASE_TABLET | Freq: Every day | ORAL | Status: DC
  Administered 2020-04-26 – 2020-04-30 (×5): 40 mg via ORAL
  Filled 2020-04-26 (×5): qty 1

## 2020-04-26 MED ORDER — METOPROLOL TARTRATE 50 MG PO TABS
100.0000 mg | ORAL_TABLET | Freq: Every day | ORAL | Status: DC
  Administered 2020-04-26: 100 mg via ORAL

## 2020-04-26 MED ORDER — DEXAMETHASONE SODIUM PHOSPHATE 10 MG/ML IJ SOLN
INTRAMUSCULAR | Status: DC | PRN
  Administered 2020-04-26: 10 mg via INTRAVENOUS

## 2020-04-26 MED ORDER — 0.9 % SODIUM CHLORIDE (POUR BTL) OPTIME
TOPICAL | Status: DC | PRN
  Administered 2020-04-26: 1000 mL

## 2020-04-26 MED ORDER — POLYETHYLENE GLYCOL 3350 17 G PO PACK
17.0000 g | PACK | Freq: Every day | ORAL | Status: DC | PRN
  Administered 2020-04-28: 17 g via ORAL
  Filled 2020-04-26: qty 1

## 2020-04-26 MED ORDER — ONDANSETRON HCL 4 MG/2ML IJ SOLN
INTRAMUSCULAR | Status: DC | PRN
  Administered 2020-04-26: 4 mg via INTRAVENOUS

## 2020-04-26 MED ORDER — OXYCODONE HCL 5 MG PO TABS
5.0000 mg | ORAL_TABLET | ORAL | Status: DC | PRN
  Administered 2020-04-26 – 2020-04-27 (×3): 5 mg via ORAL
  Administered 2020-04-28 – 2020-04-30 (×6): 10 mg via ORAL
  Filled 2020-04-26: qty 2
  Filled 2020-04-26: qty 1
  Filled 2020-04-26: qty 2
  Filled 2020-04-26 (×2): qty 1
  Filled 2020-04-26 (×4): qty 2

## 2020-04-26 MED ORDER — MIDAZOLAM HCL 2 MG/2ML IJ SOLN: 1.0000 mg | INTRAMUSCULAR | Status: DC

## 2020-04-26 MED ORDER — SODIUM CHLORIDE 0.9 % IR SOLN
Status: DC | PRN
  Administered 2020-04-26: 1000 mL

## 2020-04-26 MED ORDER — TRANEXAMIC ACID-NACL 1000-0.7 MG/100ML-% IV SOLN
1000.0000 mg | INTRAVENOUS | Status: AC
  Administered 2020-04-26: 1000 mg via INTRAVENOUS
  Filled 2020-04-26: qty 100

## 2020-04-26 MED ORDER — METOCLOPRAMIDE HCL 5 MG PO TABS: 5.0000 mg | ORAL_TABLET | Freq: Three times a day (TID) | ORAL | Status: DC | PRN

## 2020-04-26 MED ORDER — FENOFIBRATE 54 MG PO TABS
54.0000 mg | ORAL_TABLET | Freq: Every day | ORAL | Status: DC
  Administered 2020-04-26 – 2020-04-30 (×5): 54 mg via ORAL
  Filled 2020-04-26 (×5): qty 1

## 2020-04-26 MED ORDER — LACTATED RINGERS IV SOLN: INTRAVENOUS | Status: DC

## 2020-04-26 MED ORDER — FENTANYL CITRATE (PF) 100 MCG/2ML IJ SOLN
INTRAMUSCULAR | Status: AC
  Filled 2020-04-26: qty 2

## 2020-04-26 MED ORDER — POVIDONE-IODINE 10 % EX SWAB
2.0000 "application " | Freq: Once | CUTANEOUS | Status: AC
  Administered 2020-04-26: 2 via TOPICAL

## 2020-04-26 MED ORDER — ALUM & MAG HYDROXIDE-SIMETH 200-200-20 MG/5ML PO SUSP: 30.0000 mL | ORAL | Status: DC | PRN

## 2020-04-26 MED ORDER — KETOROLAC TROMETHAMINE 15 MG/ML IJ SOLN
7.5000 mg | Freq: Four times a day (QID) | INTRAMUSCULAR | Status: AC
  Administered 2020-04-26 – 2020-04-27 (×4): 7.5 mg via INTRAVENOUS
  Filled 2020-04-26 (×4): qty 1

## 2020-04-26 MED ORDER — DOCUSATE SODIUM 100 MG PO CAPS
100.0000 mg | ORAL_CAPSULE | Freq: Two times a day (BID) | ORAL | Status: DC
  Administered 2020-04-26 – 2020-04-30 (×8): 100 mg via ORAL
  Filled 2020-04-26 (×8): qty 1

## 2020-04-26 MED ORDER — PHENYLEPHRINE 40 MCG/ML (10ML) SYRINGE FOR IV PUSH (FOR BLOOD PRESSURE SUPPORT)
PREFILLED_SYRINGE | INTRAVENOUS | Status: DC | PRN
  Administered 2020-04-26 (×5): 80 ug via INTRAVENOUS

## 2020-04-26 MED ORDER — FENTANYL CITRATE (PF) 100 MCG/2ML IJ SOLN
INTRAMUSCULAR | Status: AC
  Administered 2020-04-26: 100 ug via INTRAVENOUS
  Filled 2020-04-26: qty 2

## 2020-04-26 MED ORDER — CEFAZOLIN SODIUM-DEXTROSE 2-4 GM/100ML-% IV SOLN
2.0000 g | INTRAVENOUS | Status: AC
  Administered 2020-04-26: 2 g via INTRAVENOUS
  Filled 2020-04-26: qty 100

## 2020-04-26 MED ORDER — METHOCARBAMOL 500 MG PO TABS
500.0000 mg | ORAL_TABLET | Freq: Four times a day (QID) | ORAL | Status: DC | PRN
  Administered 2020-04-26 – 2020-04-30 (×7): 500 mg via ORAL
  Filled 2020-04-26 (×7): qty 1

## 2020-04-26 MED ORDER — OXYCODONE HCL 5 MG PO TABS
10.0000 mg | ORAL_TABLET | ORAL | Status: DC | PRN
  Administered 2020-04-29: 10 mg via ORAL
  Filled 2020-04-26: qty 2

## 2020-04-26 MED ORDER — ASPIRIN EC 325 MG PO TBEC
325.0000 mg | DELAYED_RELEASE_TABLET | Freq: Two times a day (BID) | ORAL | Status: DC
  Administered 2020-04-26: 325 mg via ORAL
  Filled 2020-04-26 (×2): qty 1

## 2020-04-26 MED ORDER — ASCORBIC ACID 500 MG PO TABS
2000.0000 mg | ORAL_TABLET | Freq: Every day | ORAL | Status: DC
  Administered 2020-04-26 – 2020-04-29 (×4): 2000 mg via ORAL
  Filled 2020-04-26 (×4): qty 4

## 2020-04-26 MED ORDER — CEFAZOLIN SODIUM-DEXTROSE 1-4 GM/50ML-% IV SOLN
1.0000 g | Freq: Four times a day (QID) | INTRAVENOUS | Status: AC
  Administered 2020-04-26 (×2): 1 g via INTRAVENOUS
  Filled 2020-04-26 (×2): qty 50

## 2020-04-26 MED ORDER — DIPHENHYDRAMINE HCL 12.5 MG/5ML PO ELIX: 12.5000 mg | ORAL_SOLUTION | ORAL | Status: DC | PRN

## 2020-04-26 MED ORDER — PROPOFOL 500 MG/50ML IV EMUL
INTRAVENOUS | Status: DC | PRN
  Administered 2020-04-26: 75 ug/kg/min via INTRAVENOUS

## 2020-04-26 MED ORDER — ONDANSETRON HCL 4 MG/2ML IJ SOLN
INTRAMUSCULAR | Status: AC
  Filled 2020-04-26: qty 2

## 2020-04-26 MED ORDER — ONDANSETRON HCL 4 MG/2ML IJ SOLN: 4.0000 mg | Freq: Four times a day (QID) | INTRAMUSCULAR | Status: DC | PRN

## 2020-04-26 MED ORDER — SODIUM CHLORIDE 0.9 % IV SOLN: INTRAVENOUS | Status: DC

## 2020-04-26 MED ORDER — HYDROMORPHONE HCL 1 MG/ML IJ SOLN: 0.5000 mg | INTRAMUSCULAR | Status: DC | PRN

## 2020-04-26 MED ORDER — PHENOL 1.4 % MT LIQD: 1.0000 | OROMUCOSAL | Status: DC | PRN

## 2020-04-26 MED ORDER — ONDANSETRON HCL 4 MG PO TABS: 4.0000 mg | ORAL_TABLET | Freq: Four times a day (QID) | ORAL | Status: DC | PRN

## 2020-04-26 MED ORDER — PHENYLEPHRINE 40 MCG/ML (10ML) SYRINGE FOR IV PUSH (FOR BLOOD PRESSURE SUPPORT)
PREFILLED_SYRINGE | INTRAVENOUS | Status: AC
  Filled 2020-04-26: qty 10

## 2020-04-26 MED ORDER — BUPIVACAINE HCL 0.25 % IJ SOLN
INTRAMUSCULAR | Status: AC
  Filled 2020-04-26: qty 1

## 2020-04-26 SURGICAL SUPPLY — 56 items
BAG ZIPLOCK 12X15 (MISCELLANEOUS) IMPLANT
BEARING TIBIAL INST SZ4 10 KNE (Miscellaneous) IMPLANT
BENZOIN TINCTURE PRP APPL 2/3 (GAUZE/BANDAGES/DRESSINGS) IMPLANT
BLADE SAG 18X100X1.27 (BLADE) IMPLANT
BLADE SURG SZ10 CARB STEEL (BLADE) ×4 IMPLANT
BNDG ELASTIC 6X10 VLCR STRL LF (GAUZE/BANDAGES/DRESSINGS) ×1 IMPLANT
BNDG ELASTIC 6X5.8 VLCR STR LF (GAUZE/BANDAGES/DRESSINGS) ×3 IMPLANT
BOWL SMART MIX CTS (DISPOSABLE) IMPLANT
CEMENT BONE SIMPLEX SPEEDSET (Cement) IMPLANT
COVER SURGICAL LIGHT HANDLE (MISCELLANEOUS) ×2 IMPLANT
COVER WAND RF STERILE (DRAPES) IMPLANT
CUFF TOURN SGL QUICK 34 (TOURNIQUET CUFF) ×2
CUFF TRNQT CYL 34X4.125X (TOURNIQUET CUFF) ×1 IMPLANT
DECANTER SPIKE VIAL GLASS SM (MISCELLANEOUS) IMPLANT
DRAPE U-SHAPE 47X51 STRL (DRAPES) ×2 IMPLANT
DRSG OPSITE POSTOP 3X4 (GAUZE/BANDAGES/DRESSINGS) ×1 IMPLANT
DRSG PAD ABDOMINAL 8X10 ST (GAUZE/BANDAGES/DRESSINGS) ×4 IMPLANT
DURAPREP 26ML APPLICATOR (WOUND CARE) ×2 IMPLANT
ELECT REM PT RETURN 15FT ADLT (MISCELLANEOUS) ×2 IMPLANT
FEMORAL POSTERIOR SZ5 RT (Femur) IMPLANT
GAUZE SPONGE 4X4 12PLY STRL (GAUZE/BANDAGES/DRESSINGS) ×2 IMPLANT
GAUZE XEROFORM 1X8 LF (GAUZE/BANDAGES/DRESSINGS) ×1 IMPLANT
GLOVE BIO SURGEON STRL SZ7.5 (GLOVE) ×2 IMPLANT
GLOVE BIOGEL PI IND STRL 8 (GLOVE) ×2 IMPLANT
GLOVE BIOGEL PI INDICATOR 8 (GLOVE) ×2
GLOVE ECLIPSE 8.0 STRL XLNG CF (GLOVE) ×2 IMPLANT
GOWN STRL REUS W/TWL XL LVL3 (GOWN DISPOSABLE) ×4 IMPLANT
HANDPIECE INTERPULSE COAX TIP (DISPOSABLE) ×2
HOLDER FOLEY CATH W/STRAP (MISCELLANEOUS) IMPLANT
IMMOBILIZER KNEE 20 (SOFTGOODS) ×2
IMMOBILIZER KNEE 20 THIGH 36 (SOFTGOODS) ×1 IMPLANT
KIT TURNOVER KIT A (KITS) IMPLANT
KNEE PATELLA ASYMMETRIC 10X32 (Knees) ×1 IMPLANT
KNEE TIBIAL COMP TRI SZ4 (Knees) ×1 IMPLANT
NS IRRIG 1000ML POUR BTL (IV SOLUTION) ×2 IMPLANT
PACK TOTAL KNEE CUSTOM (KITS) ×2 IMPLANT
PADDING CAST COTTON 6X4 STRL (CAST SUPPLIES) ×4 IMPLANT
PENCIL SMOKE EVACUATOR (MISCELLANEOUS) IMPLANT
PIN FLUTED HEDLESS FIX 3.5X1/8 (PIN) ×1 IMPLANT
POSTERIOR FEMORAL SZ5 RT (Femur) ×2 IMPLANT
PROTECTOR NERVE ULNAR (MISCELLANEOUS) ×2 IMPLANT
SET HNDPC FAN SPRY TIP SCT (DISPOSABLE) ×1 IMPLANT
SET PAD KNEE POSITIONER (MISCELLANEOUS) ×2 IMPLANT
STAPLER VISISTAT 35W (STAPLE) IMPLANT
STRIP CLOSURE SKIN 1/2X4 (GAUZE/BANDAGES/DRESSINGS) IMPLANT
SUT MNCRL AB 4-0 PS2 18 (SUTURE) IMPLANT
SUT VIC AB 0 CT1 27 (SUTURE) ×2
SUT VIC AB 0 CT1 27XBRD ANTBC (SUTURE) ×1 IMPLANT
SUT VIC AB 1 CT1 36 (SUTURE) ×4 IMPLANT
SUT VIC AB 2-0 CT1 27 (SUTURE) ×4
SUT VIC AB 2-0 CT1 TAPERPNT 27 (SUTURE) ×2 IMPLANT
TIBIAL BEAR INST SZ4 10 KNEE (Miscellaneous) ×2 IMPLANT
TRAY FOLEY MTR SLVR 16FR STAT (SET/KITS/TRAYS/PACK) ×2 IMPLANT
WATER STERILE IRR 1000ML POUR (IV SOLUTION) ×2 IMPLANT
WRAP KNEE MAXI GEL POST OP (GAUZE/BANDAGES/DRESSINGS) ×2 IMPLANT
YANKAUER SUCT BULB TIP 10FT TU (MISCELLANEOUS) ×2 IMPLANT

## 2020-04-26 NOTE — Plan of Care (Signed)

## 2020-04-26 NOTE — Transfer of Care (Signed)
Immediate Anesthesia Transfer of Care Note  Patient: Richard Harding  Procedure(s) Performed: RIGHT TOTAL KNEE ARTHROPLASTY (Right Knee)  Patient Location: PACU  Anesthesia Type:Spinal  Level of Consciousness: drowsy  Airway & Oxygen Therapy: Patient Spontanous Breathing and Patient connected to face mask oxygen  Post-op Assessment: Report given to RN and Post -op Vital signs reviewed and stable  Post vital signs: Reviewed and stable  Last Vitals:  Vitals Value Taken Time  BP    Temp    Pulse 82 04/26/20 1425  Resp 11 04/26/20 1425  SpO2 100 % 04/26/20 1425  Vitals shown include unvalidated device data.  Last Pain:  Vitals:   04/26/20 1149  TempSrc:   PainSc: 2       Patients Stated Pain Goal: 3 ([id number])  Complications: No apparent anesthesia complications

## 2020-04-26 NOTE — Anesthesia Postprocedure Evaluation (Signed)
Anesthesia Post Note  Patient: Richard Harding  Procedure(s) Performed: RIGHT TOTAL KNEE ARTHROPLASTY (Right Knee)     Patient location during evaluation: PACU Anesthesia Type: Spinal Level of consciousness: oriented and awake and alert Pain management: pain level controlled Vital Signs Assessment: post-procedure vital signs reviewed and stable Respiratory status: spontaneous breathing, respiratory function stable and patient connected to nasal cannula oxygen Cardiovascular status: blood pressure returned to baseline and stable Postop Assessment: no headache, no backache and no apparent nausea or vomiting Anesthetic complications: no    Last Vitals:  Vitals:   04/26/20 1515 04/26/20 1530  BP: 140/82 139/81  Pulse: 77 78  Resp: 16 13  Temp:  36.7 C  SpO2: 95% 95%    Last Pain:  Vitals:   04/26/20 1530  TempSrc:   PainSc: 0-No pain            L Sensory Level: L5-Outer lower leg, top of foot, great toe (04/26/20 1530) R Sensory Level: L5-Outer lower leg, top of foot, great toe (04/26/20 1530)  Guthrie Lemme DAVID

## 2020-04-26 NOTE — Anesthesia Preprocedure Evaluation (Signed)
Anesthesia Evaluation  Patient identified by MRN, date of birth, ID band Patient awake    Reviewed: Allergy & Precautions, NPO status , Patient's Chart, lab work & pertinent test results  Airway Mallampati: I  TM Distance: >3 FB Neck ROM: Full    Dental   Pulmonary    Pulmonary exam normal        Cardiovascular hypertension, Pt. on medications Normal cardiovascular exam     Neuro/Psych    GI/Hepatic   Endo/Other  diabetes, Type 2, Insulin Dependent  Renal/GU      Musculoskeletal   Abdominal   Peds  Hematology   Anesthesia Other Findings   Reproductive/Obstetrics                             Anesthesia Physical Anesthesia Plan  ASA: II  Anesthesia Plan: Spinal   Post-op Pain Management:  Regional for Post-op pain   Induction:   PONV Risk Score and Plan: Treatment may vary due to age or medical condition and Ondansetron  Airway Management Planned: Nasal Cannula  Additional Equipment:   Intra-op Plan:   Post-operative Plan:   Informed Consent: I have reviewed the patients History and Physical, chart, labs and discussed the procedure including the risks, benefits and alternatives for the proposed anesthesia with the patient or authorized representative who has indicated his/her understanding and acceptance.       Plan Discussed with: CRNA and Surgeon  Anesthesia Plan Comments:         Anesthesia Quick Evaluation

## 2020-04-26 NOTE — Progress Notes (Signed)
Orthopedic Tech Progress Note Patient Details:  Richard Harding [facility] 1946/11/14 [id number]  CPM Right Knee CPM Right Knee: Off Right Knee Flexion (Degrees): 60 Right Knee Extension (Degrees): 0 Additional Comments: Trapeze bar  Post Interventions Patient Tolerated: Well Instructions Provided: Care of device  [name] 04/26/2020, 6:07 PM

## 2020-04-26 NOTE — Brief Op Note (Signed)
04/26/2020  1:58 PM  PATIENT:  Richard Harding  74 y.o. male  PRE-OPERATIVE DIAGNOSIS:  Osteoarthritis Right Knee  POST-OPERATIVE DIAGNOSIS:  Osteoarthritis Right Knee  PROCEDURE:  Procedure(s): RIGHT TOTAL KNEE ARTHROPLASTY (Right)  SURGEON:  Surgeon(s) and Role:    Mcarthur Rossetti, MD - Primary  PHYSICIAN ASSISTANT:  Benita Stabile, PA-C  ANESTHESIA:   local, regional and spinal  EBL:  50 mL   COUNTS:  YES  TOURNIQUET:   Total Tourniquet Time Documented: Thigh (Right) - 49 minutes Total: Thigh (Right) - 49 minutes   DICTATION: .Other Dictation: Dictation Number [id number]  PLAN OF CARE: Admit for overnight observation  PATIENT DISPOSITION:  PACU - hemodynamically stable.   Delay start of Pharmacological VTE agent (>24hrs) due to surgical blood loss or risk of bleeding: no

## 2020-04-26 NOTE — Progress Notes (Signed)
AssistedDr. Ossey with right, ultrasound guided, adductor canal block. Side rails up, monitors on throughout procedure. See vital signs in flow sheet. Tolerated Procedure well.  

## 2020-04-26 NOTE — Progress Notes (Signed)
PHARMACIST - PHYSICIAN ORDER COMMUNICATION  CONCERNING: P&T Medication Policy on Herbal Medications  DESCRIPTION:  This patient's order for:  Red Yeast Rice  has been noted.  This product(s) is classified as an "herbal" or natural product. Due to a lack of definitive safety studies or FDA approval, nonstandard manufacturing practices, plus the potential risk of unknown drug-drug interactions while on inpatient medications, the Pharmacy and Therapeutics Committee does not permit the use of "herbal" or natural products of this type within [facility].   ACTION TAKEN: The pharmacy department is unable to verify this order at this time. Please reevaluate patient's clinical condition at discharge and address if the herbal or natural product(s) should be resumed at that time.  Lenis Noon, PharmD 04/26/20 4:45 PM

## 2020-04-26 NOTE — Anesthesia Procedure Notes (Signed)
Date/Time: 04/26/2020 12:22 PM Performed by: Sharlette Dense, CRNA Oxygen Delivery Method: Simple face mask

## 2020-04-26 NOTE — Progress Notes (Signed)
Orthopedic Tech Progress Note Patient Details:  Richard Harding [facility] [birth year] [id number]  CPM Right Knee CPM Right Knee: On Right Knee Flexion (Degrees): 60 Right Knee Extension (Degrees): 0 Additional Comments: Trapeze bar  Post Interventions Patient Tolerated: Well Instructions Provided: Care of device  Richard Harding 04/26/2020, 3:10 PM

## 2020-04-26 NOTE — Interval H&P Note (Signed)
History and Physical Interval Note:  The patient understands he is here today for a right total knee replacement.  There has been no change in his medical status acutely.  See recent H&P.  Risks and benefits of surgery have been discussed and informed consent is obtained.  04/26/2020 11:16 AM  [place]  has presented today for surgery, with the diagnosis of Osteoarthritis Right Knee.  The various methods of treatment have been discussed with the patient and family. After consideration of risks, benefits and other options for treatment, the patient has consented to  Procedure(s): RIGHT TOTAL KNEE ARTHROPLASTY (Right) as a surgical intervention.  The patient's history has been reviewed, patient examined, no change in status, stable for surgery.  I have reviewed the patient's chart and labs.  Questions were answered to the patient's satisfaction.     Mcarthur Rossetti

## 2020-04-27 DIAGNOSIS — Z96651 Presence of right artificial knee joint: Secondary | ICD-10-CM | POA: Diagnosis not present

## 2020-04-27 DIAGNOSIS — E669 Obesity, unspecified: Secondary | ICD-10-CM | POA: Diagnosis present

## 2020-04-27 DIAGNOSIS — Z79899 Other long term (current) drug therapy: Secondary | ICD-10-CM | POA: Diagnosis not present

## 2020-04-27 DIAGNOSIS — Z981 Arthrodesis status: Secondary | ICD-10-CM | POA: Diagnosis not present

## 2020-04-27 DIAGNOSIS — Z6828 Body mass index (BMI) 28.0-28.9, adult: Secondary | ICD-10-CM | POA: Diagnosis not present

## 2020-04-27 DIAGNOSIS — E1151 Type 2 diabetes mellitus with diabetic peripheral angiopathy without gangrene: Secondary | ICD-10-CM | POA: Diagnosis present

## 2020-04-27 DIAGNOSIS — M6281 Muscle weakness (generalized): Secondary | ICD-10-CM | POA: Diagnosis not present

## 2020-04-27 DIAGNOSIS — Z7984 Long term (current) use of oral hypoglycemic drugs: Secondary | ICD-10-CM | POA: Diagnosis not present

## 2020-04-27 DIAGNOSIS — E785 Hyperlipidemia, unspecified: Secondary | ICD-10-CM | POA: Diagnosis present

## 2020-04-27 DIAGNOSIS — Z4733 Aftercare following explantation of knee joint prosthesis: Secondary | ICD-10-CM | POA: Diagnosis not present

## 2020-04-27 DIAGNOSIS — M6259 Muscle wasting and atrophy, not elsewhere classified, multiple sites: Secondary | ICD-10-CM | POA: Diagnosis not present

## 2020-04-27 DIAGNOSIS — R5381 Other malaise: Secondary | ICD-10-CM | POA: Diagnosis not present

## 2020-04-27 DIAGNOSIS — M25761 Osteophyte, right knee: Secondary | ICD-10-CM | POA: Diagnosis present

## 2020-04-27 DIAGNOSIS — I10 Essential (primary) hypertension: Secondary | ICD-10-CM | POA: Diagnosis present

## 2020-04-27 DIAGNOSIS — Z7982 Long term (current) use of aspirin: Secondary | ICD-10-CM | POA: Diagnosis not present

## 2020-04-27 DIAGNOSIS — I4510 Unspecified right bundle-branch block: Secondary | ICD-10-CM | POA: Diagnosis present

## 2020-04-27 DIAGNOSIS — Z7401 Bed confinement status: Secondary | ICD-10-CM | POA: Diagnosis not present

## 2020-04-27 DIAGNOSIS — M1991 Primary osteoarthritis, unspecified site: Secondary | ICD-10-CM | POA: Diagnosis not present

## 2020-04-27 DIAGNOSIS — Z20822 Contact with and (suspected) exposure to covid-19: Secondary | ICD-10-CM | POA: Diagnosis present

## 2020-04-27 DIAGNOSIS — M170 Bilateral primary osteoarthritis of knee: Secondary | ICD-10-CM | POA: Diagnosis present

## 2020-04-27 DIAGNOSIS — M2550 Pain in unspecified joint: Secondary | ICD-10-CM | POA: Diagnosis not present

## 2020-04-27 DIAGNOSIS — E119 Type 2 diabetes mellitus without complications: Secondary | ICD-10-CM | POA: Diagnosis not present

## 2020-04-27 LAB — BASIC METABOLIC PANEL
Anion gap: 11 (ref 5–15)
BUN: 22 mg/dL (ref 8–23)
CO2: 24 mmol/L (ref 22–32)
Calcium: 8.6 mg/dL — ABNORMAL LOW (ref 8.9–10.3)
Chloride: 103 mmol/L (ref 98–111)
Creatinine, Ser: 0.99 mg/dL (ref 0.61–1.24)
GFR calc Af Amer: >60 mL/min (ref >60)
GFR calc non Af Amer: >60 mL/min (ref >60)
Glucose, Bld: 114 mg/dL — ABNORMAL HIGH (ref 70–99)
Potassium: 3.3 mmol/L — ABNORMAL LOW (ref 3.5–5.1)
Sodium: 138 mmol/L (ref 135–145)

## 2020-04-27 LAB — CBC
HCT: 35.6 % — ABNORMAL LOW (ref 39.0–52.0)
Hemoglobin: 12.1 g/dL — ABNORMAL LOW (ref 13.0–17.0)
MCH: 29.4 pg (ref 26.0–34.0)
MCHC: 34 g/dL (ref 30.0–36.0)
MCV: 86.6 fL (ref 80.0–100.0)
Platelets: 281 10*3/uL (ref 150–400)
RBC: 4.11 MIL/uL — ABNORMAL LOW (ref 4.22–5.81)
RDW: 12.2 % (ref 11.5–15.5)
WBC: 12.9 10*3/uL — ABNORMAL HIGH (ref 4.0–10.5)
nRBC: 0 % (ref 0.0–0.2)

## 2020-04-27 MED ORDER — NAPHAZOLINE-GLYCERIN 0.012-0.2 % OP SOLN
1.0000 [drp] | Freq: Two times a day (BID) | OPHTHALMIC | Status: DC | PRN
  Administered 2020-04-27 – 2020-04-28 (×2): 2 [drp] via OPHTHALMIC
  Filled 2020-04-27 (×2): qty 15

## 2020-04-27 MED ORDER — ASPIRIN EC 81 MG PO TBEC
81.0000 mg | DELAYED_RELEASE_TABLET | Freq: Two times a day (BID) | ORAL | Status: DC
  Administered 2020-04-27 – 2020-04-30 (×6): 81 mg via ORAL
  Filled 2020-04-27 (×6): qty 1

## 2020-04-27 NOTE — Progress Notes (Signed)
Subjective: 1 Day Post-Op Procedure(s) (LRB): RIGHT TOTAL KNEE ARTHROPLASTY (Right) Patient reports pain as moderate.    Objective: Vital signs in last 24 hours: Temp:  [97.5 F (36.4 C)-98.5 F (36.9 C)] 98.3 F (36.8 C) (05/08 0941) Pulse Rate:  [72-105] 95 (05/08 0941) Resp:  [7-19] 19 (05/08 0941) BP: (119-156)/(65-97) 133/65 (05/08 0941) SpO2:  [94 %-100 %] 98 % (05/08 0941) Weight:  [88 kg] 88 kg (05/07 1045)  Intake/Output from previous day: 05/07 0701 - 05/08 0700 In: 3432.9 [P.O.:480; I.V.:2681.3; IV Piggyback:271.7] Out: 2525 [Urine:2475; Blood:50] Intake/Output this shift: No intake/output data recorded.  Recent Labs    04/27/20 0323  HGB 12.1*   Recent Labs    04/27/20 0323  WBC 12.9*  RBC 4.11*  HCT 35.6*  PLT 281   Recent Labs    04/27/20 0323  NA 138  K 3.3*  CL 103  CO2 24  BUN 22  CREATININE 0.99  GLUCOSE 114*  CALCIUM 8.6*   No results for input(s): LABPT, INR in the last 72 hours.  Sensation intact distally Intact pulses distally Dorsiflexion/Plantar flexion intact Incision: dressing C/D/I No cellulitis present Compartment soft   Assessment/Plan: 1 Day Post-Op Procedure(s) (LRB): RIGHT TOTAL KNEE ARTHROPLASTY (Right) Up with therapy Discharge to SNF   Anticipated LOS equal to or greater than 2 midnights due to - Age 7 and older with one or more of the following:  - Obesity  - Expected need for hospital services (PT, OT, Nursing) required for safe  discharge  - Anticipated need for postoperative skilled nursing care or inpatient rehab  - Active co-morbidities: None OR   - Unanticipated findings during/Post Surgery: None  - Patient is a high risk of re-admission due to: None  He has no family or support at home and lives alone.  Needs short-term skilled nursing.  Hopefully will qualify for this.  Transitional Care Team has been notified.  Mcarthur Rossetti 04/27/2020, 10:06 AM

## 2020-04-27 NOTE — Plan of Care (Signed)
  Problem: Education: Goal: Knowledge of General Education information will improve Description: Including pain rating scale, medication(s)/side effects and non-pharmacologic comfort measures Outcome: Progressing   Problem: Health Behavior/Discharge Planning: Goal: Ability to manage health-related needs will improve Outcome: Progressing   Problem: Clinical Measurements: Goal: Ability to maintain clinical measurements within normal limits will improve Outcome: Progressing Goal: Will remain free from infection Outcome: Progressing Goal: Diagnostic test results will improve Outcome: Progressing Goal: Respiratory complications will improve Outcome: Progressing Goal: Cardiovascular complication will be avoided Outcome: Progressing   Problem: Activity: Goal: Risk for activity intolerance will decrease Outcome: Progressing   Problem: Nutrition: Goal: Adequate nutrition will be maintained Outcome: Progressing   Problem: Coping: Goal: Level of anxiety will decrease Outcome: Progressing   Problem: Elimination: Goal: Will not experience complications related to bowel motility Outcome: Progressing Goal: Will not experience complications related to urinary retention Outcome: Progressing   Problem: Pain Managment: Goal: General experience of comfort will improve Outcome: Progressing   Problem: Safety: Goal: Ability to remain free from injury will improve Outcome: Progressing   Problem: Skin Integrity: Goal: Risk for impaired skin integrity will decrease Outcome: Progressing   Problem: Education: Goal: Knowledge of the prescribed therapeutic regimen will improve Outcome: Progressing Goal: Individualized Educational Video(s) Outcome: Progressing   Problem: Activity: Goal: Ability to avoid complications of mobility impairment will improve Outcome: Progressing Goal: Range of joint motion will improve Outcome: Progressing   Problem: Clinical Measurements: Goal:  Postoperative complications will be avoided or minimized Outcome: Progressing   Problem: Skin Integrity: Goal: Will show signs of wound healing Outcome: Progressing   

## 2020-04-27 NOTE — Op Note (Signed)
NAME: Richard Harding, Richard Harding [facility] MEDICAL RECORD [id number] ACCOUNT [id number] DATE OF BIRTH:[birth year] FACILITY: [facility] LOCATION: [facility] PHYSICIAN:Meagon Duskin [place], MD  OPERATIVE REPORT  DATE OF PROCEDURE:  04/26/2020  PREOPERATIVE DIAGNOSIS:  Primary osteoarthritis and degenerative joint disease, right knee.  POSTOPERATIVE DIAGNOSIS:  Primary osteoarthritis and degenerative joint disease, right knee.  PROCEDURE:  Right total knee arthroplasty.  IMPLANTS:  Stryker Triathlon press-fit knee system with size 5 femur, size 4 tibial tray, 10 mm fixed bearing polyethylene insert, size 32 patellar button.  SURGEON:  Jean Rosenthal, MD  ASSISTANT:  Erskine Emery, PA-C.  ANESTHESIA:   1.  Right lower extremity adductor canal block. 2.  Spinal. 3.  Local with 0.25% plain Marcaine anterior capsular injection.  TOURNIQUET TIME:  Less than 1 hour.  ANTIBIOTICS:  Two grams IV Ancef.  ESTIMATED BLOOD LOSS:  Less  than 100 mL.  COMPLICATIONS:  None.  INDICATIONS:  The patient is a 74 year old gentleman well known to me.  He has debilitating arthritis of both his knees with varus malalignment.  His x-rays show bone-on-bone wear.  He has tried and failed all forms of conservative treatment.  At this  point, his pain is daily and has detrimentally affected his activities of daily living, his mobility, his quality of life.  At this point, he does wish to proceed with total knee arthroplasty.  We talked about the risk of acute blood loss anemia, nerve  or vessel injury, fracture, infection, DVT, and implant failure.  We talked about our goals being decreased pain, improve mobility and overall improve quality of life.  PROCEDURE DESCRIPTION:  After informed consent was obtained and appropriate right hip, appropriate right knee was marked.  Anesthesia obtained a right lower extremity adductor canal block.  He was then brought to the operating room and sat up on the  operating table where  spinal anesthesia was then obtained.  He was then laid in supine position on the operating table.  A nonsterile tourniquet was placed on his upper right thigh and his right thigh, knee, leg, and ankle and foot were prepped and  draped with DuraPrep and sterile drapes, including a sterile stockinette.  A time-out was called.  He was identified as correct patient, correct right knee.  We then used an Esmarch to wrap that leg and tourniquet was inflated to 300 mm of pressure.  We  then made a direct midline incision over the patella and carried this proximally and distally.  We dissected down the knee joint and carried out a medial parapatellar arthrotomy finding a moderate joint effusion and significant osteoarthritis and  degenerative all 3 compartments of the knee.  With the knee in a flexed position, we removed remnants of ACL, PCL, medial and lateral meniscus and osteophytes around the knee.  We set our extramedullary cutting guide for making our proximal tibia cut.   We made this for 0 slope correcting for varus and valgus, taking 9 mm off the high side.  We took this cut without difficulty.  We then went to the femur into the intramedullary guide for distal femoral cut setting this for right knee at 5 degrees  externally rotated with a 10 mm distal femoral cut.  We made this cut without difficulty and brought the knee back down to full extension and with a 9 mm extension block and achieved full extension.  We then went back to the femur and put our femoral  sizing guide based off the epicondylar axis and Whitesides line for right  knee at 3 degrees.  Based off this, we chose a size 5 femur, we put a 4-in-1 cutting block for a size 5 femur and made our anterior and posterior cuts, followed by our chamfer  cuts.  We then made our femoral box cut.  Attention was then turned back to the tibia.  We chose a size 4 tibial tray for coverage setting the rotation off the tibial tubercle and the femur.  We made the  keel punch without difficulty.  Of note, we felt  his quality of bone to be excellent and quite hard, so we felt that a press-fit instrumentation to be appropriate for this case.  With a size 4 trial tibia, we placed the trial 5 right femur and trialed with a 9 mm fixed bearing polyethylene insert.  We  brought the knee back down to full extension and we were pleased with the flexion and extension of the knee.  We did feel like we may be just a little bit 1 more mm in size of the poly.  We then made our patellar cut and drilled 3 holes for a size 32  press-fit patellar button.  We then put the knee through range of motion again.  We were pleased with the range of motion of the knee.  We then removed all instrumentation from the knee and irrigated the knee with normal saline solution using pulsatile  lavage.  We dried the knee real well and then placed our 0.25% Marcaine intra-articular injection of the joint capsule, we then dried the knee again with the knee in a flexed position, we placed our real Stryker Triathlon press-fit knee, size 4 press-fit  tibia tray size 4.  We press-fit our size 5 right femur.  We placed our 10 mm fixed bearing polyethylene insert and press-fit our size 32 patellar button.  I put the knee through several cycles of motion.  I was pleased with range of motion and  stability.  We then let the tourniquet down and hemostasis was obtained with electrocautery.  We closed the arthrotomy with interrupted #1 Vicryl suture followed by closing the deep tissue was 0 Vicryl, the superficial tissue was closed with 2-0 Vicryl  and staples were used to reapproximate the skin.  Xeroform well-padded sterile dressing was applied.  He was taken to recovery room in stable condition.  All final counts were correct.  There were no complications noted.    Of note, Benita Stabile, PA-C, assisted the entire case.  His assistance was crucial for facilitating all aspects of this case.  PN/NUANCE   D:04/26/2020 T:04/27/2020 JOB:[id number]

## 2020-04-27 NOTE — Progress Notes (Signed)
Physical Therapy Treatment Patient Details Name: Richard Harding MRN: [id number] DOB: [birth year] Today's Date: 04/27/2020    History of Present Illness 74 yo male s/p R TKA 04/26/20. Hx of DM, obesity.    PT Comments    Progressing with mobility.    Follow Up Recommendations  Follow surgeon's recommendation for DC plan and follow-up therapies;SNF     Equipment Recommendations  None recommended by PT    Recommendations for Other Services       Precautions / Restrictions Precautions Precautions: Fall;Knee Required Braces or Orthoses: Knee Immobilizer - Right Knee Immobilizer - Right: Discontinue once straight leg raise with < 10 degree lag Restrictions Weight Bearing Restrictions: No Other Position/Activity Restrictions: WBAT    Mobility  Bed Mobility Overal bed mobility: Needs Assistance Bed Mobility: Supine to Sit;Sit to Supine     Supine to sit: Min assist;HOB elevated Sit to supine: Min assist;HOB elevated   General bed mobility comments: Assist for R LE. Increased time. VCs safety, technique, sequencing.  Transfers Overall transfer level: Needs assistance Equipment used: Rolling walker (2 wheeled) Transfers: Sit to/from Stand Sit to Stand: Min assist;From elevated surface         General transfer comment: Assist to power up. VCs safety, technique, hand/LE placement. Increased time.  Ambulation/Gait Ambulation/Gait assistance: Min assist Gait Distance (Feet): 75 Feet Assistive device: Rolling walker (2 wheeled) Gait Pattern/deviations: Step-to pattern;Decreased step length - right;Decreased step length - left     General Gait Details: VCs safety, technique, sequencing, posture, step length. Slow gait speed. Intermittent assist to steady. Distance limited by pain.   Stairs             Wheelchair Mobility    Modified Rankin (Stroke Patients Only)       Balance Overall balance assessment: Needs assistance         Standing balance  support: Bilateral upper extremity supported Standing balance-Leahy Scale: Poor                              Cognition Arousal/Alertness: Awake/alert Behavior During Therapy: WFL for tasks assessed/performed Overall Cognitive Status: Within Functional Limits for tasks assessed                                        Exercises      General Comments        Pertinent Vitals/Pain Pain Assessment: 0-10 Pain Score: 7  Pain Location: R knee with activity Pain Intervention(s): Monitored during session;Repositioned    Home Living                      Prior Function            PT Goals (current goals can now be found in the care plan section) Progress towards PT goals: Progressing toward goals    Frequency    7X/week      PT Plan Current plan remains appropriate    Co-evaluation              AM-PAC PT "6 Clicks" Mobility   Outcome Measure  Help needed turning from your back to your side while in a flat bed without using bedrails?: A Little Help needed moving from lying on your back to sitting on the side of a flat bed without using bedrails?: A Little Help needed  moving to and from a bed to a chair (including a wheelchair)?: A Little Help needed standing up from a chair using your arms (e.g., wheelchair or bedside chair)?: A Little Help needed to walk in hospital room?: A Little Help needed climbing 3-5 steps with a railing? : A Lot 6 Click Score: 17    End of Session Equipment Utilized During Treatment: Gait belt;Right knee immobilizer Activity Tolerance: Patient limited by fatigue;Patient limited by pain Patient left: in chair;with call bell/phone within reach;with chair alarm set   PT Visit Diagnosis: Pain;Other abnormalities of gait and mobility (R26.89) Pain - Right/Left: Right Pain - part of body: Knee     Time: [id number] PT Time Calculation (min) (ACUTE ONLY): 14 min  Charges:  $Gait Training: 8-22 mins                         Doreatha Massed, PT Acute Rehabilitation

## 2020-04-27 NOTE — Evaluation (Signed)
Physical Therapy Evaluation Patient Details Name: Richard Harding MRN: [id number] DOB: 1946-10-21 Today's Date: 04/27/2020   History of Present Illness  74 yo male s/p R TKA 04/26/20. Hx of DM, obesity.  Clinical Impression  On eval, pt required Min assist for mobility. He walked ~40 feet with a RW. Pt required cues to put forth full effort when performing ROM exercises. He appears to be limited by pain. Encouraged pt to work through pain as best he can. Discussed d/c plan-pt lives alone and does not have any help available. Recommend ST rehab at SNF. Will continue to follow and progress activity as tolerated.     Follow Up Recommendations Follow surgeon's recommendation for DC plan and follow-up therapies;SNF    Equipment Recommendations  None recommended by PT    Recommendations for Other Services OT consult     Precautions / Restrictions Precautions Precautions: Fall;Knee Required Braces or Orthoses: Knee Immobilizer - Right Knee Immobilizer - Right: Discontinue once straight leg raise with < 10 degree lag Restrictions Weight Bearing Restrictions: No Other Position/Activity Restrictions: WBAT      Mobility  Bed Mobility Overal bed mobility: Needs Assistance Bed Mobility: Supine to Sit     Supine to sit: Min assist;HOB elevated     General bed mobility comments: Assist for R LE. Increased time. VCs safety, technique, sequencing.  Transfers Overall transfer level: Needs assistance Equipment used: Rolling walker (2 wheeled) Transfers: Sit to/from Stand Sit to Stand: Min assist;From elevated surface         General transfer comment: Assist to power up. VCs safety, technique, hand/LE placement. Increased time.  Ambulation/Gait Ambulation/Gait assistance: Min assist Gait Distance (Feet): 40 Feet Assistive device: Rolling walker (2 wheeled) Gait Pattern/deviations: Step-to pattern;Antalgic;Decreased step length - left;Decreased step length - right     General  Gait Details: VCs safety, technique, sequencing, posture, step length. Slow gait speed. Intermittent assist to steady. Followed with recliner-used to transport pt back to room 2* pain, fatigue.  Stairs            Wheelchair Mobility    Modified Rankin (Stroke Patients Only)       Balance Overall balance assessment: Needs assistance         Standing balance support: Bilateral upper extremity supported Standing balance-Leahy Scale: Poor                               Pertinent Vitals/Pain Pain Assessment: 0-10 Pain Score: 8  Pain Location: R knee with activity Pain Descriptors / Indicators: Discomfort;Sore;Aching Pain Intervention(s): Limited activity within patient's tolerance;Monitored during session;Repositioned;Ice applied    Home Living Family/patient expects to be discharged to:: Private residence Living Arrangements: Alone   Type of Home: House Home Access: Stairs to enter Entrance Stairs-Rails: None Entrance Stairs-Number of Steps: 5 Home Layout: One level Home Equipment: None      Prior Function Level of Independence: Independent               Hand Dominance        Extremity/Trunk Assessment   Upper Extremity Assessment Upper Extremity Assessment: Defer to OT evaluation    Lower Extremity Assessment Lower Extremity Assessment: RLE deficits/detail RLE Deficits / Details: Strength at least 3/5 throughout. Limited by pain and less than full effort from pt    Cervical / Trunk Assessment Cervical / Trunk Assessment: Normal  Communication   Communication: No difficulties  Cognition Arousal/Alertness: Awake/alert Behavior During Therapy:  WFL for tasks assessed/performed Overall Cognitive Status: Within Functional Limits for tasks assessed                                        General Comments      Exercises Total Joint Exercises Ankle Circles/Pumps: AROM;Both;10 reps Quad Sets: AROM;Both;10 reps Heel  Slides: AAROM;Right;10 reps Hip ABduction/ADduction: AAROM;Right;10 reps Straight Leg Raises: AAROM;Right;10 reps Goniometric ROM: ~10-45 degrees (limited by pain and less than full effort from pt)   Assessment/Plan    PT Assessment Patient needs continued PT services  PT Problem List Decreased strength;Decreased mobility;Decreased range of motion;Decreased activity tolerance;Decreased balance;Decreased knowledge of use of DME;Pain       PT Treatment Interventions DME instruction;Gait training;Therapeutic exercise;Patient/family education;Balance training;Functional mobility training;Therapeutic activities    PT Goals (Current goals can be found in the Care Plan section)  Acute Rehab PT Goals Patient Stated Goal: st rehab PT Goal Formulation: With patient Time For Goal Achievement: 05/11/20 Potential to Achieve Goals: Good    Frequency 7X/week   Barriers to discharge        Co-evaluation               AM-PAC PT "6 Clicks" Mobility  Outcome Measure Help needed turning from your back to your side while in a flat bed without using bedrails?: A Little Help needed moving from lying on your back to sitting on the side of a flat bed without using bedrails?: A Little Help needed moving to and from a bed to a chair (including a wheelchair)?: A Little Help needed standing up from a chair using your arms (e.g., wheelchair or bedside chair)?: A Little Help needed to walk in hospital room?: A Little Help needed climbing 3-5 steps with a railing? : A Lot 6 Click Score: 17    End of Session Equipment Utilized During Treatment: Gait belt;Right knee immobilizer Activity Tolerance: Patient limited by fatigue;Patient limited by pain Patient left: in chair;with call bell/phone within reach;with chair alarm set   PT Visit Diagnosis: Pain;Other abnormalities of gait and mobility (R26.89) Pain - Right/Left: Right Pain - part of body: Knee    Time: [id number] PT Time Calculation (min)  (ACUTE ONLY): 38 min   Charges:   PT Evaluation $PT Eval Low Complexity: 1 Low PT Treatments $Gait Training: 8-22 mins $Therapeutic Exercise: 8-22 mins          Doreatha Massed, PT Acute Rehabilitation

## 2020-04-27 NOTE — Discharge Instructions (Signed)

## 2020-04-27 NOTE — TOC Initial Note (Addendum)
Transition of Care [facility]) - Initial/Assessment Note    Patient Details  Name: Richard Harding MRN: [id number] Date of Birth: 01/22/1946  Transition of [facility]) CM/SW Contact:    [name], LCSW Phone Number: 04/27/2020, 5:43 PM  Clinical Narrative:                  Patient is a 74 year old male who is alert and oriented x4.  Patient states he lives alone, and does not feel safe going home at this time.  Patient stated he does not have any support available nearby, buy he will have a friend who will be able to stay with him in a few days.  Patient is concerned about his knee surrgery and his weakness he feels he will need some short term rehab, before he is able to return back home.  CSW explained to patient what to expect and process for finding placement for him.  CSW explained to patient that there is a chance that insurance may not approve SNF placement, if this does occur, the other option would be going home with home health.  Patient stated he is hopeful he will get approved for SNF, but understands that there is a chance that he may not.  Patient was seen by PT and they recommended SNF.  Patient has [employer], and will need insurance authorization before he is able to go to SNF.  Patient gave CSW permission to begin bed search in [facility].    Expected Discharge Plan: Skilled Nursing Facility Barriers to Discharge: SNF Pending bed offer, Unsafe home situation, Ship broker, Continued Medical Work up   Patient Goals and CMS Choice Patient states their goals for this hospitalization and ongoing recovery are:: Patient lives alone, and does not have any support at home currently.  Patient feels he needs some short term rehab, before he is able to return back home. CMS Medicare.gov Compare Post Acute Care list provided to:: Patient Choice offered to / list presented to : Patient  Expected Discharge Plan and Services Expected Discharge Plan:  [place] In-house Referral: Clinical Social Work   Post Acute Care Choice: [place] Living arrangements for the past 2 months: [place]                   DME Agency: NA       HH Arranged: NA          Prior Living Arrangements/Services Living arrangements for the past 2 months: Single Family Home Lives with:: Self Patient language and need for interpreter reviewed:: Yes Do you feel safe going back to the place where you live?: No   Patient lives alone and does not have any support at home or in the area.  Unsafe discharge home.  Need for Family Participation in Patient Care: No (Comment) Care giver support system in place?: No (comment)   Criminal Activity/Legal Involvement Pertinent to Current Situation/Hospitalization: No - Comment as needed  Activities of Daily Living Home Assistive Devices/Equipment: Eyeglasses, CBG Meter, Blood pressure cuff, Cane (specify quad or straight) ADL Screening (condition at time of admission) Patient's cognitive ability adequate to safely complete daily activities?: Yes Is the patient deaf or have difficulty hearing?: No Does the patient have difficulty seeing, even when wearing glasses/contacts?: No Does the patient have difficulty concentrating, remembering, or making decisions?: No Patient able to express need for assistance with ADLs?: Yes Does the patient have difficulty dressing  or bathing?: No Independently performs ADLs?: Yes (appropriate for developmental age) Does the patient have difficulty walking or climbing stairs?: Yes Weakness of Legs: Both Weakness of Arms/Hands: None  Permission Sought/Granted Permission sought to share information with : Facility Sport and exercise psychologist, Family Supports Permission granted to share information with : Yes, Verbal Permission Granted  Share Information with NAME: Feliberto Harts [id number]  [phone]  Permission granted to share info w AGENCY:  SNF admissions        Emotional Assessment Appearance:: Appears stated age Attitude/Demeanor/Rapport: Engaged Affect (typically observed): Accepting, Calm, Appropriate, Stable, Pleasant Orientation: : Oriented to Self, Oriented to Place, Oriented to  Time, Oriented to Situation Alcohol / Substance Use: Not Applicable Psych Involvement: No (comment)  Admission diagnosis:  Status post total right knee replacement [Z96.651] Patient Active Problem List   Diagnosis Date Noted  . 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 Arthritis of right knee 04/25/2020  . Hypokalemia 01/12/2019  . DM (diabetes mellitus) type II, controlled, with peripheral vascular disorder ([place]) 09/16/2017  . Essential hypertension 01/18/2014  . Hyperlipidemia LDL goal <70 01/18/2014  . Obesity (BMI 30-39.9) 01/18/2014   PCP:  Ronnald Nian, DO Pharmacy:   [place] [address] Phone: [phone] Fax: [phone]     Social Determinants of Health (SDOH) Interventions    Readmission Risk Interventions No flowsheet data found.

## 2020-04-27 NOTE — [place] FL2 (Signed)
[place] MEDICAID FL2 LEVEL OF CARE SCREENING TOOL     IDENTIFICATION  Patient Name: Richard Harding Birthdate: 1946/09/22 Sex: male Admission Date (Current Location): 04/26/2020  [facility] and [place] Number:  Herbalist and Address:  [facility],  [place], [place]      Provider Number: [id number]  Attending Physician Name and Address:  Mcarthur Rossetti,*  Relative Name and Phone Number:  Richard Harding [phone]  [phone]    Current Level of Care: Hospital Recommended Level of Care: [place] Prior Approval Number:    Date Approved/Denied:   PASRR Number: [id number]  Discharge Plan: SNF    Current Diagnoses: Patient Active Problem List   Diagnosis Date Noted  . 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 Arthritis of right knee 04/25/2020  . Hypokalemia 01/12/2019  . DM (diabetes mellitus) type II, controlled, with peripheral vascular disorder ([place]) 09/16/2017  . Essential hypertension 01/18/2014  . Hyperlipidemia LDL goal <70 01/18/2014  . Obesity (BMI 30-39.9) 01/18/2014    Orientation RESPIRATION BLADDER Height & Weight     Self, Time, Situation, Place  Normal Continent Weight: 194 lb (88 kg) Height:  5\' 9"  (175.3 cm)  BEHAVIORAL SYMPTOMS/MOOD NEUROLOGICAL BOWEL NUTRITION STATUS      Continent Diet(Carb modified)  AMBULATORY STATUS COMMUNICATION OF NEEDS Skin   Limited Assist Verbally Surgical wounds                       Personal Care Assistance Level of Assistance  Bathing, Dressing, Feeding Bathing Assistance: Limited assistance Feeding assistance: Independent Dressing Assistance: Limited assistance     Functional Limitations Info  Sight, Hearing, Speech Sight Info: Adequate Hearing Info: Adequate Speech Info: Adequate    SPECIAL CARE FACTORS FREQUENCY  PT (By licensed PT), OT (By licensed OT)     PT Frequency: Minimum 5x a week OT Frequency:  Minimum 5x a week            Contractures Contractures Info: Not present    Additional Factors Info  Code Status, Allergies Code Status Info: Full Code Allergies Info: NKA           Current Medications (04/27/2020):  This is the current hospital active medication list Current Facility-Administered Medications  Medication Dose Route Frequency Provider Last Rate Last Admin  . 0.9 %  sodium chloride infusion   Intravenous Continuous Mcarthur Rossetti, MD 75 mL/hr at 04/27/20 0647 New Bag at 04/27/20 0647  . acetaminophen (TYLENOL) tablet 325-650 mg  325-650 mg Oral Q6H PRN Mcarthur Rossetti, MD      . alum & mag hydroxide-simeth (MAALOX/MYLANTA) 200-200-20 MG/5ML suspension 30 mL  30 mL Oral Q4H PRN Mcarthur Rossetti, MD      . ascorbic acid (VITAMIN C) tablet 2,000 mg  2,000 mg Oral QHS Mcarthur Rossetti, MD   2,000 mg at 04/26/20 2203  . aspirin EC tablet 81 mg  81 mg Oral BID PC Mcarthur Rossetti, MD      . chlorthalidone (HYGROTON) tablet 25 mg  25 mg Oral Daily Mcarthur Rossetti, MD   25 mg at 04/27/20 1004  . diphenhydrAMINE (BENADRYL) 12.5 MG/5ML elixir 12.5-25 mg  12.5-25 mg Oral Q4H PRN Mcarthur Rossetti, MD      . docusate sodium (COLACE) capsule 100 mg  100 mg Oral BID Mcarthur Rossetti, MD   100 mg at 04/27/20 1001  . fenofibrate tablet 54 mg  54 mg Oral Daily Mcarthur Rossetti, MD   54 mg at 04/27/20 1005  . HYDROmorphone (DILAUDID) injection 0.5-1 mg  0.5-1 mg Intravenous Q2H PRN Mcarthur Rossetti, MD      . menthol-cetylpyridinium (CEPACOL) lozenge 3 mg  1 lozenge Oral PRN Mcarthur Rossetti, MD       Or  . phenol (CHLORASEPTIC) mouth spray 1 spray  1 spray Mouth/Throat PRN Mcarthur Rossetti, MD      . metFORMIN (GLUCOPHAGE-XR) 24 hr tablet 500 mg  500 mg Oral Q supper Mcarthur Rossetti, MD   500 mg at 04/26/20 1846  . methocarbamol (ROBAXIN) tablet 500 mg  500 mg Oral Q6H PRN Mcarthur Rossetti, MD   500 mg at 04/27/20 0539   Or  . methocarbamol (ROBAXIN) 500 mg in dextrose 5 % 50 mL IVPB  500 mg Intravenous Q6H PRN Mcarthur Rossetti, MD      . metoCLOPramide (REGLAN) tablet 5-10 mg  5-10 mg Oral Q8H PRN Mcarthur Rossetti, MD       Or  . metoCLOPramide (REGLAN) injection 5-10 mg  5-10 mg Intravenous Q8H PRN Mcarthur Rossetti, MD      . metoprolol tartrate (LOPRESSOR) tablet 50 mg  50 mg Oral BID Mcarthur Rossetti, MD   50 mg at 04/27/20 1003  . naphazoline-glycerin (CLEAR EYES REDNESS) ophth solution 1-2 drop  1-2 drop Both Eyes BID PRN Mcarthur Rossetti, MD      . niacin tablet 500 mg  500 mg Oral Daily Mcarthur Rossetti, MD   500 mg at 04/27/20 1004  . ondansetron (ZOFRAN) tablet 4 mg  4 mg Oral Q6H PRN Mcarthur Rossetti, MD       Or  . ondansetron [facility]) injection 4 mg  4 mg Intravenous Q6H PRN Mcarthur Rossetti, MD      . oxyCODONE (Oxy IR/ROXICODONE) immediate release tablet 10-15 mg  10-15 mg Oral Q4H PRN Mcarthur Rossetti, MD      . oxyCODONE (Oxy IR/ROXICODONE) immediate release tablet 5-10 mg  5-10 mg Oral Q4H PRN Mcarthur Rossetti, MD   5 mg at 04/27/20 1545  . pantoprazole (PROTONIX) EC tablet 40 mg  40 mg Oral Daily Mcarthur Rossetti, MD   40 mg at 04/27/20 1001  . polyethylene glycol (MIRALAX / GLYCOLAX) packet 17 g  17 g Oral Daily PRN Mcarthur Rossetti, MD         Discharge Medications: Please see discharge summary for a list of discharge medications.  Relevant Imaging Results:  Relevant Lab Results:   Additional Information SSN [id number]  Richard Harding, [place]

## 2020-04-28 LAB — CBC WITH DIFFERENTIAL/PLATELET
Abs Immature Granulocytes: 0.03 10*3/uL (ref 0.00–0.07)
Basophils Absolute: 0 10*3/uL (ref 0.0–0.1)
Basophils Relative: 0 %
Eosinophils Absolute: 0.1 10*3/uL (ref 0.0–0.5)
Eosinophils Relative: 1 %
HCT: 34 % — ABNORMAL LOW (ref 39.0–52.0)
Hemoglobin: 11.8 g/dL — ABNORMAL LOW (ref 13.0–17.0)
Immature Granulocytes: 0 %
Lymphocytes Relative: 26 %
Lymphs Abs: 2.8 10*3/uL (ref 0.7–4.0)
MCH: 30.6 pg (ref 26.0–34.0)
MCHC: 34.7 g/dL (ref 30.0–36.0)
MCV: 88.3 fL (ref 80.0–100.0)
Monocytes Absolute: 1.2 10*3/uL — ABNORMAL HIGH (ref 0.1–1.0)
Monocytes Relative: 11 %
Neutro Abs: 6.6 10*3/uL (ref 1.7–7.7)
Neutrophils Relative %: 62 %
Platelets: 256 10*3/uL (ref 150–400)
RBC: 3.85 MIL/uL — ABNORMAL LOW (ref 4.22–5.81)
RDW: 12.4 % (ref 11.5–15.5)
WBC: 10.8 10*3/uL — ABNORMAL HIGH (ref 4.0–10.5)
nRBC: 0 % (ref 0.0–0.2)

## 2020-04-28 LAB — BASIC METABOLIC PANEL
Anion gap: 12 (ref 5–15)
BUN: 17 mg/dL (ref 8–23)
CO2: 28 mmol/L (ref 22–32)
Calcium: 8.5 mg/dL — ABNORMAL LOW (ref 8.9–10.3)
Chloride: 97 mmol/L — ABNORMAL LOW (ref 98–111)
Creatinine, Ser: 1.1 mg/dL (ref 0.61–1.24)
GFR calc Af Amer: >60 mL/min (ref >60)
GFR calc non Af Amer: >60 mL/min (ref >60)
Glucose, Bld: 154 mg/dL — ABNORMAL HIGH (ref 70–99)
Potassium: 3 mmol/L — ABNORMAL LOW (ref 3.5–5.1)
Sodium: 137 mmol/L (ref 135–145)

## 2020-04-28 NOTE — Progress Notes (Signed)
     Subjective: 2 Days Post-Op Procedure(s) (LRB): RIGHT TOTAL KNEE ARTHROPLASTY (Right) Awake, alert and oriented x 4. Right knee with mild swelling. FL2 form is signed and  Awaiting SNF placement.  Moderated discomfort.  Patient reports pain as moderate.    Objective:   VITALS:  Temp:  [97.8 F (36.6 C)-99.2 F (37.3 C)] 99.2 F (37.3 C) (05/09 0529) Pulse Rate:  [73-95] 95 (05/09 0529) Resp:  [16-18] 16 (05/09 0529) BP: (108-143)/(61-77) 143/77 (05/09 0529) SpO2:  [98 %-100 %] 98 % (05/09 0529)  Neurologically intact ABD soft Neurovascular intact Sensation intact distally Intact pulses distally Dorsiflexion/Plantar flexion intact Incision: no drainage and scant drainage No cellulitis present Compartment soft   LABS Recent Labs    04/27/20 0323  HGB 12.1*  WBC 12.9*  PLT 281   Recent Labs    04/27/20 0323  NA 138  K 3.3*  CL 103  CO2 24  BUN 22  CREATININE 0.99  GLUCOSE 114*   No results for input(s): LABPT, INR in the last 72 hours.   Assessment/Plan: 2 Days Post-Op Procedure(s) (LRB): RIGHT TOTAL KNEE ARTHROPLASTY (Right)  Advance diet Up with therapy D/C IV fluids Discharge to SNF when placement is complete.   Basil Dess 04/28/2020, 10:06 AMPatient ID: Richard Harding, male   DOB: 1946-05-28, 74 y.o.   MRN: [id number]

## 2020-04-28 NOTE — Progress Notes (Signed)
Orthopedic Tech Progress Note Patient Details:  Richard Harding [facility] 1946/07/22 [id number]  Patient ID: Richard Harding, male   DOB: 11-25-1946, 74 y.o.   MRN: [id number]   Richard Harding 04/28/2020, 7:11 PM Pt unable to use cpm due to swelling and pain,

## 2020-04-28 NOTE — Evaluation (Signed)
Occupational Therapy Evaluation Patient Details Name: Richard Harding MRN: [id number] DOB: [birth year] Today's Date: 04/28/2020    History of Present Illness 74 yo male s/p R TKA 04/26/20. Hx of DM, obesity.   Clinical Impression   [name] is a 74 year old man normally independent who is s/p total knee replacement with decreased ROM/strength in RLE and complaints of pain resulting in impaired ability to perform independent transfers, mobility and ADLs. Patient will benefit from skilled OT services to improve deficits and learn compensatory strategies as needed in order to return to independence. Patient reports no assistance at home to assist him with self care tasks, cooking or cleaning. At this time patient would benefit from short term rehab to return him to independence prior to discharge home.    Follow Up Recommendations  SNF(Patient wants short term rehab at discharge.)    Equipment Recommendations  3 in 1 bedside commode    Recommendations for Other Services       Precautions / Restrictions Precautions Precautions: Fall;Knee Required Braces or Orthoses: Knee Immobilizer - Right Knee Immobilizer - Right: Discontinue once straight leg raise with < 10 degree lag Restrictions Weight Bearing Restrictions: No Other Position/Activity Restrictions: WBAT      Mobility Bed Mobility Overal bed mobility: Needs Assistance Bed Mobility: Supine to Sit     Supine to sit: HOB elevated;Min assist(min assist for RLE)     General bed mobility comments: oob in recliner  Transfers Overall transfer level: Needs assistance Equipment used: Rolling walker (2 wheeled) Transfers: Sit to/from Stand Sit to Stand: Min assist         General transfer comment: Assist to stand. Verbal cues for technique and leg placement. Poor tolerance secondar to pain. Performed standing for dressing and then transfer to recliner.    Balance Overall balance assessment: Needs assistance         Standing balance support: Bilateral upper extremity supported Standing balance-Leahy Scale: Poor                             ADL either performed or assessed with clinical judgement   ADL Overall ADL's : Needs assistance/impaired Eating/Feeding: Independent   Grooming: Independent;Wash/dry face;Wash/dry hands Grooming Details (indicate cue type and reason): Patient washed face and hands sitting in bed. Upper Body Bathing: Set up   Lower Body Bathing: Sit to/from stand;Minimal assistance Lower Body Bathing Details (indicate cue type and reason): min assist for Right lower leg/foot Upper Body Dressing : Set up;Sitting Upper Body Dressing Details (indicate cue type and reason): [place] able to donn t- shirt with therapist providing assistance for gathering clothing. Lower Body Dressing: Moderate assistance;Sit to/from stand Lower Body Dressing Details (indicate cue type and reason): Patient needed assistance to donn boxers and shorts over Right foot and pull up to knees. Patient able to pull up from knees. Patient needed assistance for donning footwear on right. Toilet Transfer: Minimal assistance;RW;BSC   Toileting- Water quality scientist and Hygiene: Min guard;Sit to/from stand   Tub/ Shower Transfer: Rolling walker;Minimal assistance;Shower seat   Functional mobility during ADLs: Minimal assistance       Vision Baseline Vision/History: No visual deficits Vision Assessment?: No apparent visual deficits     Perception     Praxis      Pertinent Vitals/Pain Pain Assessment: 0-10 Pain Score: 8  Pain Location: R knee with activity Pain Descriptors / Indicators: Discomfort;Sore;Aching Pain Intervention(s): Limited activity within patient's tolerance;Repositioned;Ice applied  Hand Dominance     Extremity/Trunk Assessment Upper Extremity Assessment Upper Extremity Assessment: Overall WFL for tasks assessed   Lower Extremity Assessment Lower Extremity  Assessment: Defer to PT evaluation   Cervical / Trunk Assessment Cervical / Trunk Assessment: Normal   Communication Communication Communication: No difficulties   Cognition Arousal/Alertness: Awake/alert Behavior During Therapy: WFL for tasks assessed/performed Overall Cognitive Status: Within Functional Limits for tasks assessed                                     General Comments       Exercises Total Joint Exercises Ankle Circles/Pumps: AROM;Both;10 reps Quad Sets: AROM;10 reps;AAROM;Right Heel Slides: AAROM;Right;10 reps Hip ABduction/ADduction: AAROM;Right;10 reps Straight Leg Raises: AAROM;Right;10 reps Goniometric ROM: ~10-45 degrees (limited by pain and less than full effort from pt)   Shoulder Instructions      Home Living Family/patient expects to be discharged to:: Private residence Living Arrangements: Alone   Type of Home: House Home Access: Stairs to enter Technical brewer of Steps: 5 Entrance Stairs-Rails: None Home Layout: One level     Bathroom Shower/Tub: Occupational psychologist: Standard     Home Equipment: None          Prior Functioning/Environment Level of Independence: Independent                 OT Problem List: Decreased activity tolerance;Decreased range of motion;Pain;Decreased strength      OT Treatment/Interventions: Self-care/ADL training;Therapeutic activities;DME and/or AE instruction;Patient/family education    OT Goals(Current goals can be found in the care plan section) Acute Rehab OT Goals Patient Stated Goal: to be more independent again OT Goal Formulation: With patient Time For Goal Achievement: 05/12/20 Potential to Achieve Goals: Good  OT Frequency: Min 2X/week   Barriers to D/C:            Co-evaluation              AM-PAC OT "6 Clicks" Daily Activity     Outcome Measure Help from another person eating meals?: None Help from another person taking care of  personal grooming?: A Little Help from another person toileting, which includes using toliet, bedpan, or urinal?: A Little Help from another person bathing (including washing, rinsing, drying)?: A Lot Help from another person to put on and taking off regular upper body clothing?: A Little Help from another person to put on and taking off regular lower body clothing?: A Lot 6 Click Score: 17   End of Session Equipment Utilized During Treatment: Gait belt;Rolling walker Nurse Communication: Mobility status  Activity Tolerance: Patient limited by pain Patient left: in chair;with call bell/phone within reach;with bed alarm set  OT Visit Diagnosis: Other abnormalities of gait and mobility (R26.89);Pain                Time: [id number] OT Time Calculation (min): 26 min Charges:  OT General Charges $OT Visit: 1 Visit OT Evaluation $OT Eval Low Complexity: 1 Low OT Treatments $Self Care/Home Management : 8-22 mins  Derl Barrow, OTR/L Acute Care Rehab Services  Office [phone]   Lenward Chancellor 04/28/2020, 12:59 PM

## 2020-04-28 NOTE — TOC Progression Note (Signed)
Transition of Care [facility]) - Progression Note    Patient Details  Name: Johnnathan Downs MRN: [id number] Date of Birth: 1946/10/15  Transition of Care [facility]) CM/SW [place], [place] Phone Number: [phone] 04/28/2020, 11:34 AM  Clinical Narrative:     This CSW spoke with patient and he has chosen bed offer at [facility].  Spoke with Florentina Jenny [phone] and she will reach out to Hospital doctor, nurse liaison (intern admin) to begin authorization for [facility].  This CSW let the patient know that the authorization process will begin on Monday and may take a couple of days. This CSW also explained if [employer]BS did not authorized placement he will need to return home w/ home health.  The patient verbalized understanding.  Expected Discharge Plan: Skilled Nursing Facility Barriers to Discharge: SNF Pending bed offer, Unsafe home situation, Ship broker, Continued Medical Work up  Expected Discharge Plan and Services Expected Discharge Plan: [place] In-house Referral: Clinical Social Work   Post Acute Care Choice: [place] arrangements for the past 2 months: [place]                   DME Agency: NA       HH Arranged: NA           Social Determinants of Health (SDOH) Interventions    Readmission Risk Interventions No flowsheet data found.

## 2020-04-28 NOTE — Progress Notes (Signed)
Orthopedic Tech Progress Note Patient Details:  [name] [facility] [birth year] [id number]  Patient ID: Richard Harding, male   DOB: May 17, 1946, 74 y.o.   MRN: [id number]   Kennis Carina 04/28/2020, 3:48 PM cpm applied. Turned down to 50 degrees d/t pain

## 2020-04-28 NOTE — TOC Progression Note (Signed)
Transition of Care [facility]) - Progression Note    Patient Details  Name: Richard Harding MRN: [id number] Date of Birth: 1945-12-23  Transition of Care [facility]) CM/SW [place], [place] Phone Number: 04/28/2020, 4:15 PM  Clinical Narrative:     This CSW contacted [place] to start authorization process, spoke w/ [place]. Faxed H&P, PT, OT and clinical related to patient health status to [phone].  Expected Discharge Plan: Skilled Nursing Facility Barriers to Discharge: SNF Pending bed offer, Unsafe home situation, Ship broker, Continued Medical Work up  Expected Discharge Plan and Services Expected Discharge Plan: [place] In-house Referral: Clinical Social Work   Post Acute Care Choice: [place] arrangements for the past 2 months: [place]                   DME Agency: NA       HH Arranged: NA           Social Determinants of Health (SDOH) Interventions    Readmission Risk Interventions No flowsheet data found.

## 2020-04-28 NOTE — Progress Notes (Signed)
Physical Therapy Treatment Patient Details Name: Richard Harding MRN: [id number] DOB: 11-09-1946 Today's Date: 04/28/2020    History of Present Illness 74 yo male s/p R TKA 04/26/20. Hx of DM, obesity.    PT Comments    Progressing slowly with mobility. Pt reports experiencing increased pain and stiffness on today. He was only able to tolerate short distance ambulation this am. Continuing to encourage pt to put forth max effort and to try to work through the pain as best he can. Will continue to follow and progress activity as tolerated. Continue to recommend SNF.     Follow Up Recommendations  Follow surgeon's recommendation for DC plan and follow-up therapies;SNF     Equipment Recommendations  None recommended by PT    Recommendations for Other Services       Precautions / Restrictions Precautions Precautions: Fall;Knee Required Braces or Orthoses: Knee Immobilizer - Right Knee Immobilizer - Right: Discontinue once straight leg raise with < 10 degree lag Restrictions Weight Bearing Restrictions: No Other Position/Activity Restrictions: WBAT    Mobility  Bed Mobility               General bed mobility comments: oob in recliner  Transfers Overall transfer level: Needs assistance Equipment used: Rolling walker (2 wheeled) Transfers: Sit to/from Stand Sit to Stand: Min assist         General transfer comment: Assist to power up. VCs safety, technique, hand/LE placement. Increased time.  Ambulation/Gait Ambulation/Gait assistance: Min assist Gait Distance (Feet): 15 Feet Assistive device: Rolling walker (2 wheeled) Gait Pattern/deviations: Step-to pattern;Decreased step length - right;Decreased step length - left     General Gait Details: VCs safety, technique, sequencing, posture, step length. Slow gait speed. Intermittent assist to steady. Distance limited by pain-pt only able to tolerate walking from bathroom to recliner this am   Stairs              Wheelchair Mobility    Modified Rankin (Stroke Patients Only)       Balance Overall balance assessment: Needs assistance         Standing balance support: Bilateral upper extremity supported Standing balance-Leahy Scale: Poor                              Cognition Arousal/Alertness: Awake/alert Behavior During Therapy: WFL for tasks assessed/performed Overall Cognitive Status: Within Functional Limits for tasks assessed                                        Exercises Total Joint Exercises Ankle Circles/Pumps: AROM;Both;10 reps Quad Sets: AROM;10 reps;AAROM;Right Heel Slides: AAROM;Right;10 reps Hip ABduction/ADduction: AAROM;Right;10 reps Straight Leg Raises: AAROM;Right;10 reps Goniometric ROM: ~10-45 degrees (limited by pain and less than full effort from pt)    General Comments        Pertinent Vitals/Pain Pain Assessment: 0-10 Pain Score: 8  Pain Location: R knee with activity Pain Descriptors / Indicators: Discomfort;Sore;Aching Pain Intervention(s): Limited activity within patient's tolerance;Repositioned;Ice applied    Home Living                      Prior Function            PT Goals (current goals can now be found in the care plan section) Progress towards PT goals: Progressing toward goals  Frequency    7X/week      PT Plan Current plan remains appropriate    Co-evaluation              AM-PAC PT "6 Clicks" Mobility   Outcome Measure  Help needed turning from your back to your side while in a flat bed without using bedrails?: A Little Help needed moving from lying on your back to sitting on the side of a flat bed without using bedrails?: A Little Help needed moving to and from a bed to a chair (including a wheelchair)?: A Little Help needed standing up from a chair using your arms (e.g., wheelchair or bedside chair)?: A Little Help needed to walk in hospital room?: A Little Help  needed climbing 3-5 steps with a railing? : A Lot 6 Click Score: 17    End of Session Equipment Utilized During Treatment: Gait belt Activity Tolerance: Patient limited by fatigue;Patient limited by pain Patient left: in chair;with call bell/phone within reach   PT Visit Diagnosis: Pain;Other abnormalities of gait and mobility (R26.89) Pain - Right/Left: Right Pain - part of body: Knee     Time: 0927-0950 PT Time Calculation (min) (ACUTE ONLY): 23 min  Charges:  $Gait Training: 8-22 mins $Therapeutic Exercise: 8-22 mins                        Doreatha Massed, PT Acute Rehabilitation

## 2020-04-28 NOTE — Progress Notes (Signed)
Orthopedic Tech Progress Note Patient Details:  [name] [facility] 13-Jan-1946 [id number]  Patient ID: Richard Harding, male   DOB: 05/08/1946, 74 y.o.   MRN: [id number]   Kennis Carina 04/28/2020, 5:10 PM

## 2020-04-28 NOTE — Progress Notes (Signed)
Physical Therapy Treatment Patient Details Name: Richard Harding MRN: [id number] DOB: Apr 23, 1946 Today's Date: 04/28/2020    History of Present Illness 74 yo male s/p R TKA 04/26/20. Hx of DM, obesity.    PT Comments    Progressing slowly with mobility. Moderate pain with activity. Cues for safety.    Follow Up Recommendations  Follow surgeon's recommendation for DC plan and follow-up therapies;SNF     Equipment Recommendations  None recommended by PT    Recommendations for Other Services       Precautions / Restrictions Precautions Precautions: Fall;Knee Required Braces or Orthoses: Knee Immobilizer - Right Knee Immobilizer - Right: Discontinue once straight leg raise with < 10 degree lag Restrictions Weight Bearing Restrictions: No Other Position/Activity Restrictions: WBAT    Mobility  Bed Mobility Overal bed mobility: Needs Assistance Bed Mobility: Supine to Sit;Sit to Supine     Supine to sit: Min assist;HOB elevated Sit to supine: Min assist;HOB elevated   General bed mobility comments: oob in recliner  Transfers Overall transfer level: Needs assistance Equipment used: Rolling walker (2 wheeled) Transfers: Sit to/from Stand Sit to Stand: Min assist;From elevated surface         General transfer comment: VCs safety, technqiue, hand/LE placement. Assist to rise, steady.  Ambulation/Gait Ambulation/Gait assistance: Min guard Gait Distance (Feet): 60 Feet Assistive device: Rolling walker (2 wheeled) Gait Pattern/deviations: Step-to pattern;Decreased step length - right;Decreased step length - left     General Gait Details: VCs safety, technique, sequencing, posture, step length. Very slow gait speed. Distance limited by pain, fatigue   Stairs             Wheelchair Mobility    Modified Rankin (Stroke Patients Only)       Balance Overall balance assessment: Needs assistance         Standing balance support: Bilateral upper  extremity supported Standing balance-Leahy Scale: Poor                              Cognition Arousal/Alertness: Awake/alert Behavior During Therapy: WFL for tasks assessed/performed Overall Cognitive Status: Within Functional Limits for tasks assessed                                        Exercises Total Joint Exercises Ankle Circles/Pumps: AROM;Both;10 reps Quad Sets: AROM;10 reps;AAROM;Right Heel Slides: AAROM;Right;10 reps Hip ABduction/ADduction: AAROM;Right;10 reps Straight Leg Raises: AAROM;Right;10 reps Goniometric ROM: ~10-45 degrees (limited by pain and less than full effort from pt)    General Comments        Pertinent Vitals/Pain Pain Assessment: 0-10 Pain Score: 7  Pain Location: R knee with activity Pain Descriptors / Indicators: Discomfort;Sore;Aching Pain Intervention(s): Limited activity within patient's tolerance;Repositioned;RN gave pain meds during session    Home Living Family/patient expects to be discharged to:: Private residence Living Arrangements: Alone   Type of Home: House Home Access: Stairs to enter Entrance Stairs-Rails: None Home Layout: One level Home Equipment: None      Prior Function Level of Independence: Independent          PT Goals (current goals can now be found in the care plan section) Acute Rehab PT Goals Patient Stated Goal: to be more independent again Progress towards PT goals: Progressing toward goals    Frequency    7X/week      PT  Plan Current plan remains appropriate    Co-evaluation              AM-PAC PT "6 Clicks" Mobility   Outcome Measure  Help needed turning from your back to your side while in a flat bed without using bedrails?: A Little Help needed moving from lying on your back to sitting on the side of a flat bed without using bedrails?: A Little Help needed moving to and from a bed to a chair (including a wheelchair)?: A Little Help needed standing up  from a chair using your arms (e.g., wheelchair or bedside chair)?: A Little Help needed to walk in hospital room?: A Little Help needed climbing 3-5 steps with a railing? : A Lot 6 Click Score: 17    End of Session Equipment Utilized During Treatment: Gait belt Activity Tolerance: Patient limited by fatigue;Patient limited by pain Patient left: in bed;with call bell/phone within reach;with bed alarm set;with nursing/sitter in room   PT Visit Diagnosis: Pain;Other abnormalities of gait and mobility (R26.89) Pain - Right/Left: Left Pain - part of body: Knee     Time: [id number] PT Time Calculation (min) (ACUTE ONLY): 32 min  Charges:  $Gait Training: 23-37 mins $Therapeutic Exercise: 8-22 mins                         Doreatha Massed, PT Acute Rehabilitation

## 2020-04-29 ENCOUNTER — Encounter: Payer: Self-pay | Admitting: *Deleted

## 2020-04-29 LAB — SARS CORONAVIRUS 2 (TAT 6-24 HRS): SARS Coronavirus 2: NEGATIVE

## 2020-04-29 MED ORDER — METHOCARBAMOL 500 MG PO TABS: 500.0000 mg | ORAL_TABLET | Freq: Four times a day (QID) | ORAL | 0 refills | Status: DC | PRN

## 2020-04-29 MED ORDER — ASPIRIN 81 MG PO TBEC: 81.0000 mg | DELAYED_RELEASE_TABLET | Freq: Two times a day (BID) | ORAL | 0 refills | Status: DC

## 2020-04-29 MED ORDER — OXYCODONE HCL 5 MG PO TABS: 5.0000 mg | ORAL_TABLET | ORAL | 0 refills | Status: DC | PRN

## 2020-04-29 NOTE — Progress Notes (Signed)
Patient ID: Richard Harding, male   DOB: Feb 19, 1946, 74 y.o.   MRN: [id number] Nursing called 5/9 PM concern about swelling right knee TKR. Discussed with patient earlier on rounds that he is having some swelling that is likely a bruise within the knee. There is no drainage and the swelling is intraarticular. I recommended ACE wrap, ice and elevation. A CBC was drawn and BMET. BS remain stable at 150s, renal function is normal, as are electrolytes, CBC with differential with  Normal WBC at 10K with downward trend compared with previous  CBC from 5/8.

## 2020-04-29 NOTE — TOC Progression Note (Addendum)
Transition of Care [facility]) - Progression Note    Patient Details  Name: Nicollas Kwiatkowski MRN: [id number] Date of Birth: 1946-02-27  Transition of Care [facility]) CM/SW [place], [place] Phone Number: 04/29/2020, 10:40 AM  Clinical Narrative:    CSW confirm bed at [facility] requests a new covid test, rapid if possible. Nurse notified.  PASRR complete.  [employer] medicare authorization under review Patient notified of discharge barriers.   Authorization received.  [id number], Approved for 3 days 5/10-5/1 Facility given the auth.details.   Waiting for covid test results.     Expected Discharge Plan: [place] Barriers to Discharge: Ship broker, Other (comment)(Covid 19 test)  Expected Discharge Plan and Services Expected Discharge Plan: [place] In-house Referral: Clinical Social Work   Post Acute Care Choice: [place] arrangements for the past 2 months: [place] Expected Discharge Date: 04/29/20                 DME Agency: NA       HH Arranged: NA           Social Determinants of Health (SDOH) Interventions    Readmission Risk Interventions No flowsheet data found.

## 2020-04-29 NOTE — Progress Notes (Signed)
Patient ID: Richard Harding, male   DOB: August 13, 1946, 74 y.o.   MRN: [id number] Right total knee stable.  Patient working with PT.  Awaiting insurance authorization and bed availability for short-term skilled nursing placement.  Can be discharged today if this happens.

## 2020-04-29 NOTE — Plan of Care (Signed)
  Problem: Health Behavior/Discharge Planning: Goal: Ability to manage health-related needs will improve Outcome: Progressing   Problem: Education: Goal: Knowledge of General Education information will improve Description: Including pain rating scale, medication(s)/side effects and non-pharmacologic comfort measures Outcome: Progressing   Problem: Clinical Measurements: Goal: Ability to maintain clinical measurements within normal limits will improve Outcome: Progressing Goal: Will remain free from infection Outcome: Progressing Goal: Diagnostic test results will improve Outcome: Progressing Goal: Respiratory complications will improve Outcome: Progressing Goal: Cardiovascular complication will be avoided Outcome: Progressing   Problem: Activity: Goal: Risk for activity intolerance will decrease Outcome: Progressing   Problem: Nutrition: Goal: Adequate nutrition will be maintained Outcome: Progressing   Problem: Coping: Goal: Level of anxiety will decrease Outcome: Progressing   Problem: Elimination: Goal: Will not experience complications related to bowel motility Outcome: Progressing Goal: Will not experience complications related to urinary retention Outcome: Progressing   Problem: Pain Managment: Goal: General experience of comfort will improve Outcome: Progressing   Problem: Safety: Goal: Ability to remain free from injury will improve Outcome: Progressing   Problem: Skin Integrity: Goal: Risk for impaired skin integrity will decrease Outcome: Progressing   Problem: Education: Goal: Knowledge of the prescribed therapeutic regimen will improve Outcome: Progressing Goal: Individualized Educational Video(s) Outcome: Progressing   Problem: Activity: Goal: Ability to avoid complications of mobility impairment will improve Outcome: Progressing Goal: Range of joint motion will improve Outcome: Progressing   Problem: Clinical Measurements: Goal:  Postoperative complications will be avoided or minimized Outcome: Progressing   Problem: Pain Management: Goal: Pain level will decrease with appropriate interventions Outcome: Progressing   Problem: Skin Integrity: Goal: Will show signs of wound healing Outcome: Progressing

## 2020-04-29 NOTE — Progress Notes (Signed)
Physical Therapy Treatment Patient Details Name: Richard Harding MRN: [id number] DOB: Jul 28, 1946 Today's Date: 04/29/2020    History of Present Illness 74 yo male s/p R TKA 04/26/20. Hx of DM, obesity.    PT Comments    Pt is progressing slowly. He continues to require encouragement to put forth full effort, especially with ROM exercises. Explained importance of working through pain/discomfort as best he can now. SNF rep in to visit pt. Will continue to follow and progress activity as tolerated.     Follow Up Recommendations  Follow surgeon's recommendation for DC plan and follow-up therapies;SNF     Equipment Recommendations  None recommended by PT    Recommendations for Other Services       Precautions / Restrictions Precautions Precautions: Fall;Knee Required Braces or Orthoses: Knee Immobilizer - Right Knee Immobilizer - Right: Discontinue once straight leg raise with < 10 degree lag Restrictions Weight Bearing Restrictions: No Other Position/Activity Restrictions: WBAT    Mobility  Bed Mobility Overal bed mobility: Needs Assistance Bed Mobility: Supine to Sit     Supine to sit: Min assist;HOB elevated     General bed mobility comments: Assist for R LE. Pt relies on trapeze. Encouraged him to try not to use it so much.  Transfers Overall transfer level: Needs assistance Equipment used: Rolling walker (2 wheeled) Transfers: Sit to/from Stand Sit to Stand: Min assist;From elevated surface         General transfer comment: VCs safety, technqiue, hand/LE placement. Assist to rise, steady.  Ambulation/Gait Ambulation/Gait assistance: Min guard Gait Distance (Feet): 90 Feet Assistive device: Rolling walker (2 wheeled) Gait Pattern/deviations: Step-to pattern;Decreased step length - right;Decreased step length - left     General Gait Details: VCs safety, technique, sequencing, posture, step length. Very slow gait speed. Min guard for safety.    Stairs              Wheelchair Mobility    Modified Rankin (Stroke Patients Only)       Balance Overall balance assessment: Needs assistance         Standing balance support: Bilateral upper extremity supported Standing balance-Leahy Scale: Poor                              Cognition Arousal/Alertness: Awake/alert Behavior During Therapy: WFL for tasks assessed/performed Overall Cognitive Status: Within Functional Limits for tasks assessed                                        Exercises Total Joint Exercises Ankle Circles/Pumps: AROM;Both;10 reps Quad Sets: AROM;10 reps;Right Heel Slides: AAROM;Right;10 reps Hip ABduction/ADduction: AAROM;Right;10 reps Straight Leg Raises: AAROM;Right;10 reps Goniometric ROM: ~10-45 degrees (limited by pain and less than full effort from pt)    General Comments        Pertinent Vitals/Pain Pain Assessment: 0-10 Pain Score: 6  Pain Location: R knee with activity Pain Descriptors / Indicators: Discomfort;Sore;Aching Pain Intervention(s): Monitored during session;Repositioned;Ice applied    Home Living                      Prior Function            PT Goals (current goals can now be found in the care plan section) Progress towards PT goals: Progressing toward goals    Frequency  7X/week      PT Plan Current plan remains appropriate    Co-evaluation              AM-PAC PT "6 Clicks" Mobility   Outcome Measure  Help needed turning from your back to your side while in a flat bed without using bedrails?: A Little Help needed moving from lying on your back to sitting on the side of a flat bed without using bedrails?: A Little Help needed moving to and from a bed to a chair (including a wheelchair)?: A Little Help needed standing up from a chair using your arms (e.g., wheelchair or bedside chair)?: A Little Help needed to walk in hospital room?: A Little Help needed climbing  3-5 steps with a railing? : A Lot 6 Click Score: 17    End of Session Equipment Utilized During Treatment: Gait belt Activity Tolerance: Patient limited by fatigue;Patient limited by pain Patient left: in chair;with call bell/phone within reach   PT Visit Diagnosis: Pain;Other abnormalities of gait and mobility (R26.89) Pain - Right/Left: Right Pain - part of body: Knee     Time: 1120-1200 PT Time Calculation (min) (ACUTE ONLY): 40 min  Charges:  $Gait Training: 38-52 mins $Therapeutic Exercise: 8-22 mins                         Doreatha Massed, PT Acute Rehabilitation

## 2020-04-29 NOTE — Progress Notes (Signed)
Orthopedic Tech Progress Note Patient Details:  Richard Harding [facility] 01-13-1946 [id number]  CPM Right Knee CPM Right Knee: Off Right Knee Flexion (Degrees): 60 Right Knee Extension (Degrees): 0 Additional Comments: Pt did not want to go in CPM  Post Interventions Patient Tolerated: Unable to use device properly Instructions Provided: Care of device  [name] 04/29/2020, 4:47 PM

## 2020-04-29 NOTE — Discharge Summary (Signed)
Patient ID: Richard Harding MRN: [id number] DOB/AGE: September 05, 1946 74 y.o.  Admit date: 04/26/2020 Discharge date: 04/29/2020  Admission Diagnoses:  Principal Problem:   Arthritis of right knee Active Problems:   Status post total right knee replacement   Discharge Diagnoses:  Same  Past Medical History:  Diagnosis Date  . Arthritis   . Arthritis of right knee 04/25/2020  . Carpal tunnel syndrome, bilateral   . Chickenpox   . Diabetes mellitus without complication ([place])   . Hyperlipemia   . Hypertension   . RBBB 04/17/2020   Noted on EKG    Surgeries: Procedure(s): RIGHT TOTAL KNEE ARTHROPLASTY on 04/26/2020   Consultants:   Discharged Condition: Improved  Hospital Course: Richard Harding is an 74 y.o. male who was admitted 04/26/2020 for operative treatment ofArthritis of right knee. Patient has severe unremitting pain that affects sleep, daily activities, and work/hobbies. After pre-op clearance the patient was taken to the operating room on 04/26/2020 and underwent  Procedure(s): RIGHT TOTAL KNEE ARTHROPLASTY.    Patient was given perioperative antibiotics:  Anti-infectives (From admission, onward)   Start     Dose/Rate Route Frequency Ordered Stop   04/26/20 1830  ceFAZolin (ANCEF) IVPB 1 g/50 mL premix     1 g 100 mL/hr over 30 Minutes Intravenous Every 6 hours 04/26/20 1629 04/27/20 0000   04/26/20 1015  ceFAZolin (ANCEF) IVPB 2g/100 mL premix     2 g 200 mL/hr over 30 Minutes Intravenous On call to O.R. 04/26/20 1008 04/26/20 1235       Patient was given sequential compression devices, early ambulation, and chemoprophylaxis to prevent DVT.  Patient benefited maximally from hospital stay and there were no complications.    Recent vital signs:  Patient Vitals for the past 24 hrs:  BP Temp Temp src Pulse Resp SpO2  04/29/20 0505 139/72 97.9 F (36.6 C) Oral 82 16 99 %  04/28/20 2122 (!) 150/66 99.6 F (37.6 C) Oral 99 16 97 %  04/28/20 1338 136/64 99.8 F  (37.7 C) Oral 90 -- 99 %     Recent laboratory studies:  Recent Labs    04/27/20 0323 04/27/20 0323 04/28/20 1945  WBC 12.9*  --  10.8*  HGB 12.1*  --  11.8*  HCT 35.6*  --  34.0*  PLT 281  --  256  NA 138  --  137  K 3.3*  --  3.0*  CL 103  --  97*  CO2 24  --  28  BUN 22  --  17  CREATININE 0.99  --  1.10  GLUCOSE 114*  --  154*  CALCIUM 8.6*   < > 8.5*   < > = values in this interval not displayed.     Discharge Medications:   Allergies as of 04/29/2020   No Known Allergies     Medication List    TAKE these medications   aspirin 81 MG EC tablet Take 1 tablet (81 mg total) by mouth 2 (two) times daily after a meal. What changed:   medication strength  when to take this   blood glucose meter kit and supplies Kit Check blood glucose once daily. E11.9.   chlorthalidone 25 MG tablet Commonly known as: HYGROTON Take 1 tablet (25 mg total) by mouth daily.   fenofibrate 54 MG tablet Take 1qd (plz schedule virtual visit for January) What changed:   how much to take  how to take this  when to take this  additional  instructions   FLAX SEED OIL PO Take 1 capsule by mouth daily.   GLUCOSA-CHONDR-NA CHONDR-MSM PO Take 2 capsules by mouth at bedtime.   metFORMIN 500 MG 24 hr tablet Commonly known as: GLUCOPHAGE-XR Take 1 tablet (500 mg total) by mouth every evening.   methocarbamol 500 MG tablet Commonly known as: ROBAXIN Take 1 tablet (500 mg total) by mouth every 6 (six) hours as needed for muscle spasms.   metoprolol tartrate 50 MG tablet Commonly known as: LOPRESSOR Take 1 tablet (50 mg total) by mouth 2 (two) times daily. What changed:   how much to take  when to take this  additional instructions   niacin 500 MG tablet Take 1 tablet (500 mg total) by mouth daily.   omega-3 acid ethyl esters 1 g capsule Commonly known as: LOVAZA Take 1 capsule (1 g total) by mouth 4 (four) times daily.   oxyCODONE 5 MG immediate release  tablet Commonly known as: Oxy IR/ROXICODONE Take 1-2 tablets (5-10 mg total) by mouth every 4 (four) hours as needed for moderate pain (pain score 4-6).   potassium chloride 10 MEQ tablet Commonly known as: KLOR-CON Take 1 tablet (10 mEq total) by mouth daily for 4 days.   Red Yeast Rice 600 MG Caps Take 1 capsule (600 mg total) by mouth daily.   vitamin C 1000 MG tablet Take 2,000 mg by mouth at bedtime.            Durable Medical Equipment  (From admission, onward)         Start     Ordered   04/26/20 1630  DME 3 n 1  Once     04/26/20 1629   04/26/20 1630  DME Walker rolling  Once    Question Answer Comment  Walker: With 5 Inch Wheels   Patient needs a walker to treat with the following condition Status post total right knee replacement      04/26/20 1629          Diagnostic Studies: DG Knee Right Port  Result Date: 04/26/2020 CLINICAL DATA:  Status post right total knee replacement. EXAM: PORTABLE RIGHT KNEE - 1-2 VIEW COMPARISON:  March 13, 2020. FINDINGS: The tibial, femoral and patellar components appear to be well situated. Expected postoperative changes are noted in the soft tissues anteriorly. IMPRESSION: Status post right total knee arthroplasty. Electronically Signed   By: Marijo Conception M.D.   On: 04/26/2020 15:25    Disposition: Discharge disposition: 03-Skilled [place]    Mcarthur Rossetti, MD Follow up in 2 week(s).   Specialty: Orthopedic Surgery Contact information: [address] [place] [address] [phone]            Signed: Mcarthur Rossetti 04/29/2020, 7:20 AM

## 2020-04-30 DIAGNOSIS — M1991 Primary osteoarthritis, unspecified site: Secondary | ICD-10-CM | POA: Diagnosis not present

## 2020-04-30 DIAGNOSIS — M6281 Muscle weakness (generalized): Secondary | ICD-10-CM | POA: Diagnosis not present

## 2020-04-30 DIAGNOSIS — R5381 Other malaise: Secondary | ICD-10-CM | POA: Diagnosis not present

## 2020-04-30 DIAGNOSIS — M1990 Unspecified osteoarthritis, unspecified site: Secondary | ICD-10-CM | POA: Diagnosis not present

## 2020-04-30 DIAGNOSIS — M00861 Arthritis due to other bacteria, right knee: Secondary | ICD-10-CM | POA: Diagnosis not present

## 2020-04-30 DIAGNOSIS — Z96651 Presence of right artificial knee joint: Secondary | ICD-10-CM | POA: Diagnosis not present

## 2020-04-30 DIAGNOSIS — M2550 Pain in unspecified joint: Secondary | ICD-10-CM | POA: Diagnosis not present

## 2020-04-30 DIAGNOSIS — E785 Hyperlipidemia, unspecified: Secondary | ICD-10-CM | POA: Diagnosis not present

## 2020-04-30 DIAGNOSIS — Z4733 Aftercare following explantation of knee joint prosthesis: Secondary | ICD-10-CM | POA: Diagnosis not present

## 2020-04-30 DIAGNOSIS — I10 Essential (primary) hypertension: Secondary | ICD-10-CM | POA: Diagnosis not present

## 2020-04-30 DIAGNOSIS — E119 Type 2 diabetes mellitus without complications: Secondary | ICD-10-CM | POA: Diagnosis not present

## 2020-04-30 DIAGNOSIS — M6259 Muscle wasting and atrophy, not elsewhere classified, multiple sites: Secondary | ICD-10-CM | POA: Diagnosis not present

## 2020-04-30 DIAGNOSIS — Z7401 Bed confinement status: Secondary | ICD-10-CM | POA: Diagnosis not present

## 2020-04-30 NOTE — Progress Notes (Signed)
Patient ID: Richard Harding, male   DOB: 25-Apr-1946, 74 y.o.   MRN: [id number] No acute changes.  Right knee stable.  Vitals stable. Covid-19 test negative.  Can be discharged to skilled nursing today.

## 2020-04-30 NOTE — Progress Notes (Signed)
Physical Therapy Treatment Patient Details Name: Richard Harding MRN: [id number] DOB: [birth year] Today's Date: 04/30/2020    History of Present Illness 74 yo male s/p R TKA 04/26/20. Hx of DM, obesity.    PT Comments    Pt continues to progress well. Plan is for d/c to SNF today.    Follow Up Recommendations  Follow surgeon's recommendation for DC plan and follow-up therapies;SNF     Equipment Recommendations  None recommended by PT    Recommendations for Other Services       Precautions / Restrictions Precautions Precautions: Fall;Knee Restrictions Weight Bearing Restrictions: No Other Position/Activity Restrictions: WBAT    Mobility  Bed Mobility               General bed mobility comments: oob in recliner  Transfers Overall transfer level: Needs assistance Equipment used: Rolling walker (2 wheeled) Transfers: Sit to/from Stand Sit to Stand: From elevated surface;Min guard         General transfer comment: VCs safety, technqiue, hand/LE placement. Assist to rise, steady.  Ambulation/Gait Ambulation/Gait assistance: Min guard Gait Distance (Feet): 60 Feet Assistive device: Rolling walker (2 wheeled) Gait Pattern/deviations: Step-to pattern;Decreased step length - right;Decreased step length - left     General Gait Details: VCs safety, technique, sequencing, posture, step length. Very slow gait speed.   Stairs             Wheelchair Mobility    Modified Rankin (Stroke Patients Only)       Balance Overall balance assessment: Needs assistance         Standing balance support: Bilateral upper extremity supported Standing balance-Leahy Scale: Poor                              Cognition Arousal/Alertness: Awake/alert Behavior During Therapy: WFL for tasks assessed/performed Overall Cognitive Status: Within Functional Limits for tasks assessed                                        Exercises Total  Joint Exercises Ankle Circles/Pumps: AROM;Both;10 reps Quad Sets: AROM;Right;10 reps Heel Slides: AAROM;Right;10 reps    General Comments        Pertinent Vitals/Pain      Home Living                      Prior Function            PT Goals (current goals can now be found in the care plan section) Progress towards PT goals: Progressing toward goals    Frequency    7X/week      PT Plan Current plan remains appropriate    Co-evaluation              AM-PAC PT "6 Clicks" Mobility   Outcome Measure  Help needed turning from your back to your side while in a flat bed without using bedrails?: A Little Help needed moving from lying on your back to sitting on the side of a flat bed without using bedrails?: A Little Help needed moving to and from a bed to a chair (including a wheelchair)?: A Little Help needed standing up from a chair using your arms (e.g., wheelchair or bedside chair)?: A Little Help needed to walk in hospital room?: A Little Help needed climbing 3-5 steps with a railing? :  A Lot 6 Click Score: 17    End of Session Equipment Utilized During Treatment: Gait belt Activity Tolerance: Patient limited by fatigue Patient left: in chair;with call bell/phone within reach   PT Visit Diagnosis: Pain;Other abnormalities of gait and mobility (R26.89) Pain - Right/Left: Right Pain - part of body: Knee     Time: [id number] PT Time Calculation (min) (ACUTE ONLY): 21 min  Charges:  $Gait Training: 8-22 mins                        Doreatha Massed, PT Acute Rehabilitation

## 2020-04-30 NOTE — Addendum Note (Signed)
Addendum  created 04/30/20 2049 by Lillia Abed, MD   Child order released for a procedure order, Clinical Note Signed, Intraprocedure Blocks edited, Order Canceled from Note

## 2020-04-30 NOTE — TOC Transition Note (Signed)
Transition of Care [facility]) - CM/SW Discharge Note   Patient Details  Name: Fergus Dettling MRN: [id number] Date of Birth: Aug 01, 1946  Transition of Care [facility]) CM/SW Contact:  Lia Hopping, [place] Phone Number: 04/30/2020, 9:15 AM   Clinical Narrative:    Covid test negative,  SNF ready to accept.  Nurse call report to:  [phone] Room 164 PTAR arranged to transport at 10:30am.    Final next level of care: Skilled Nursing Facility Barriers to Discharge: Barriers Resolved   Patient Goals and CMS Choice Patient states their goals for this hospitalization and ongoing recovery are:: Patient lives alone, and does not have any support at home currently.  Patient feels he needs some short term rehab, before he is able to return back home. CMS Medicare.gov Compare Post Acute Care list provided to:: Patient Choice offered to / list presented to : Patient  Discharge Placement PASRR number recieved: 04/27/20            Patient chooses bed at: ([facility]) Patient to be transferred to facility by: [facility] Name of family member notified: Patient will notify his family Patient and family notified of of transfer: 04/30/20  Discharge Plan and Services In-house Referral: Clinical Social Work   Post Acute Care Choice: [place]            DME Agency: NA       HH Arranged: NA          Social Determinants of Health (SDOH) Interventions     Readmission Risk Interventions No flowsheet data found.

## 2020-04-30 NOTE — Anesthesia Procedure Notes (Signed)
Anesthesia Regional Block: Adductor canal block   Pre-Anesthetic Checklist: ,, timeout performed, Correct Patient, Correct Site, Correct Laterality, Correct Procedure, Correct Position, site marked, Risks and benefits discussed,  Surgical consent,  Pre-op evaluation,  At surgeon's request and post-op pain management  Laterality: Right  Prep: chloraprep       Needles:  Injection technique: Single-shot  Needle Type: Echogenic Stimulator Needle     Needle Length: 9cm  Needle Gauge: 21     Additional Needles:   Narrative:  Start time: 04/26/2020 10:45 AM End time: 04/26/2020 10:55 AM Injection made incrementally with aspirations every 5 mL.  Performed by: Personally  Anesthesiologist: Lillia Abed, MD  Additional Notes: Monitors applied. Patient sedated. Sterile prep and drape,hand hygiene and sterile gloves were used. Relevant anatomy identified.Needle position confirmed.Local anesthetic injected incrementally after negative aspiration. Local anesthetic spread visualized around nerve(s). Vascular puncture avoided. No complications. Image printed for medical record.The patient tolerated the procedure well.    Lillia Abed MD

## 2020-04-30 NOTE — Plan of Care (Signed)
  Problem: Education: Goal: Knowledge of General Education information will improve Description: Including pain rating scale, medication(s)/side effects and non-pharmacologic comfort measures Outcome: Progressing   Problem: Health Behavior/Discharge Planning: Goal: Ability to manage health-related needs will improve Outcome: Progressing   Problem: Clinical Measurements: Goal: Ability to maintain clinical measurements within normal limits will improve Outcome: Progressing Goal: Will remain free from infection Outcome: Progressing Goal: Diagnostic test results will improve Outcome: Progressing Goal: Respiratory complications will improve Outcome: Progressing Goal: Cardiovascular complication will be avoided Outcome: Progressing   Problem: Activity: Goal: Risk for activity intolerance will decrease Outcome: Progressing   Problem: Nutrition: Goal: Adequate nutrition will be maintained Outcome: Progressing   Problem: Elimination: Goal: Will not experience complications related to bowel motility Outcome: Progressing Goal: Will not experience complications related to urinary retention Outcome: Progressing   Problem: Coping: Goal: Level of anxiety will decrease Outcome: Progressing   Problem: Pain Managment: Goal: General experience of comfort will improve Outcome: Progressing   Problem: Safety: Goal: Ability to remain free from injury will improve Outcome: Progressing   Problem: Skin Integrity: Goal: Risk for impaired skin integrity will decrease Outcome: Progressing   Problem: Education: Goal: Knowledge of the prescribed therapeutic regimen will improve Outcome: Progressing Goal: Individualized Educational Video(s) Outcome: Progressing   Problem: Activity: Goal: Ability to avoid complications of mobility impairment will improve Outcome: Progressing Goal: Range of joint motion will improve Outcome: Progressing   Problem: Clinical Measurements: Goal:  Postoperative complications will be avoided or minimized Outcome: Progressing   Problem: Pain Management: Goal: Pain level will decrease with appropriate interventions Outcome: Progressing   Problem: Skin Integrity: Goal: Will show signs of wound healing Outcome: Progressing

## 2020-04-30 NOTE — Care Management Important Message (Signed)
Important Message  Patient Details IM Letter given to [place] Case Manager to present to the Patient Name: Richard Harding MRN: [id number] Date of Birth: Aug 05, 1946   Medicare Important Message Given:        Kerin Salen 04/30/2020, 10:52 AM

## 2020-04-30 NOTE — Anesthesia Procedure Notes (Signed)
Spinal  Patient location during procedure: OR Start time: 04/26/2020 12:24 PM End time: 04/26/2020 12:27 PM Staffing Performed: anesthesiologist  Anesthesiologist: Lillia Abed, MD Preanesthetic Checklist Completed: patient identified, IV checked, risks and benefits discussed, surgical consent, monitors and equipment checked, pre-op evaluation and timeout performed Spinal Block Patient position: sitting Prep: ChloraPrep Patient monitoring: heart rate, cardiac monitor, continuous pulse ox and blood pressure Approach: right paramedian Location: L3-4 Injection technique: single-shot Needle Needle type: Pencan  Needle length: 10 cm Needle insertion depth: 7 cm

## 2020-04-30 NOTE — Discharge Summary (Signed)
Patient ID: Richard Harding MRN: [id number] DOB/AGE: 08/07/1946 74 y.o.  Admit date: 04/26/2020 Discharge date: 04/30/2020  Admission Diagnoses:  Principal Problem:   Arthritis of right knee Active Problems:   Status post total right knee replacement   Discharge Diagnoses:  Same  Past Medical History:  Diagnosis Date  . Arthritis   . Arthritis of right knee 04/25/2020  . Carpal tunnel syndrome, bilateral   . Chickenpox   . Diabetes mellitus without complication (HCC)   . Hyperlipemia   . Hypertension   . RBBB 04/17/2020   Noted on EKG    Surgeries: Procedure(s): RIGHT TOTAL KNEE ARTHROPLASTY on 04/26/2020   Consultants:   Discharged Condition: Improved  Hospital Course: Richard Harding is an 74 y.o. male who was admitted 04/26/2020 for operative treatment ofArthritis of right knee. Patient has severe unremitting pain that affects sleep, daily activities, and work/hobbies. After pre-op clearance the patient was taken to the operating room on 04/26/2020 and underwent  Procedure(s): RIGHT TOTAL KNEE ARTHROPLASTY.    Patient was given perioperative antibiotics:  Anti-infectives (From admission, onward)   Start     Dose/Rate Route Frequency Ordered Stop   04/26/20 1830  ceFAZolin (ANCEF) IVPB 1 g/50 mL premix     1 g 100 mL/hr over 30 Minutes Intravenous Every 6 hours 04/26/20 1629 04/27/20 0000   04/26/20 1015  ceFAZolin (ANCEF) IVPB 2g/100 mL premix     2 g 200 mL/hr over 30 Minutes Intravenous On call to O.R. 04/26/20 1008 04/26/20 1235       Patient was given sequential compression devices, early ambulation, and chemoprophylaxis to prevent DVT.  Patient benefited maximally from hospital stay and there were no complications.    Recent vital signs:  Patient Vitals for the past 24 hrs:  BP Temp Temp src Pulse Resp SpO2  04/30/20 0604 125/61 98.8 F (37.1 C) Oral 90 18 97 %  04/29/20 2103 139/75 99.1 F (37.3 C) Oral 95 16 99 %  04/29/20 1404 137/74 99.8 F (37.7  C) -- 88 18 100 %     Recent laboratory studies:  Recent Labs    04/28/20 1945  WBC 10.8*  HGB 11.8*  HCT 34.0*  PLT 256  NA 137  K 3.0*  CL 97*  CO2 28  BUN 17  CREATININE 1.10  GLUCOSE 154*  CALCIUM 8.5*     Discharge Medications:   Allergies as of 04/30/2020   No Known Allergies     Medication List    TAKE these medications   aspirin 81 MG EC tablet Take 1 tablet (81 mg total) by mouth 2 (two) times daily after a meal. What changed:   medication strength  when to take this   blood glucose meter kit and supplies Kit Check blood glucose once daily. E11.9.   chlorthalidone 25 MG tablet Commonly known as: HYGROTON Take 1 tablet (25 mg total) by mouth daily.   fenofibrate 54 MG tablet Take 1qd (plz schedule virtual visit for January) What changed:   how much to take  how to take this  when to take this  additional instructions   FLAX SEED OIL PO Take 1 capsule by mouth daily.   GLUCOSA-CHONDR-NA CHONDR-MSM PO Take 2 capsules by mouth at bedtime.   metFORMIN 500 MG 24 hr tablet Commonly known as: GLUCOPHAGE-XR Take 1 tablet (500 mg total) by mouth every evening.   methocarbamol 500 MG tablet Commonly known as: ROBAXIN Take 1 tablet (500 mg total) by mouth   every 6 (six) hours as needed for muscle spasms.   metoprolol tartrate 50 MG tablet Commonly known as: LOPRESSOR Take 1 tablet (50 mg total) by mouth 2 (two) times daily. What changed:   how much to take  when to take this  additional instructions   niacin 500 MG tablet Take 1 tablet (500 mg total) by mouth daily.   omega-3 acid ethyl esters 1 g capsule Commonly known as: LOVAZA Take 1 capsule (1 g total) by mouth 4 (four) times daily.   oxyCODONE 5 MG immediate release tablet Commonly known as: Oxy IR/ROXICODONE Take 1-2 tablets (5-10 mg total) by mouth every 4 (four) hours as needed for moderate pain (pain score 4-6).   potassium chloride 10 MEQ tablet Commonly known as:  KLOR-CON Take 1 tablet (10 mEq total) by mouth daily for 4 days.   Red Yeast Rice 600 MG Caps Take 1 capsule (600 mg total) by mouth daily.   vitamin C 1000 MG tablet Take 2,000 mg by mouth at bedtime.            Durable Medical Equipment  (From admission, onward)         Start     Ordered   04/26/20 1630  DME 3 n 1  Once     04/26/20 1629   04/26/20 1630  DME Walker rolling  Once    Question Answer Comment  Walker: With 5 Inch Wheels   Patient needs a walker to treat with the following condition Status post total right knee replacement      04/26/20 1629          Diagnostic Studies: DG Knee Right Port  Result Date: 04/26/2020 CLINICAL DATA:  Status post right total knee replacement. EXAM: PORTABLE RIGHT KNEE - 1-2 VIEW COMPARISON:  March 13, 2020. FINDINGS: The tibial, femoral and patellar components appear to be well situated. Expected postoperative changes are noted in the soft tissues anteriorly. IMPRESSION: Status post right total knee arthroplasty. Electronically Signed   By: Marijo Conception M.D.   On: 04/26/2020 15:25    Disposition: Discharge disposition: 03-Skilled [place] information for follow-up providers    Richard Rossetti, MD Follow up in 2 week(s).   Specialty: Orthopedic Surgery Contact information: [place] [address] [phone]            Contact information for after-discharge care    Destination    [facility] .   Service: Skilled Nursing Contact information: [address] [place] [phone]                   Signed: Mcarthur Harding 04/30/2020, 7:26 AM

## 2020-05-08 DIAGNOSIS — Z96651 Presence of right artificial knee joint: Secondary | ICD-10-CM | POA: Diagnosis not present

## 2020-05-08 DIAGNOSIS — Z981 Arthrodesis status: Secondary | ICD-10-CM | POA: Diagnosis not present

## 2020-05-08 DIAGNOSIS — Z6829 Body mass index (BMI) 29.0-29.9, adult: Secondary | ICD-10-CM | POA: Diagnosis not present

## 2020-05-08 DIAGNOSIS — E785 Hyperlipidemia, unspecified: Secondary | ICD-10-CM | POA: Diagnosis not present

## 2020-05-08 DIAGNOSIS — M1712 Unilateral primary osteoarthritis, left knee: Secondary | ICD-10-CM | POA: Diagnosis not present

## 2020-05-08 DIAGNOSIS — Z471 Aftercare following joint replacement surgery: Secondary | ICD-10-CM | POA: Diagnosis not present

## 2020-05-08 DIAGNOSIS — E1151 Type 2 diabetes mellitus with diabetic peripheral angiopathy without gangrene: Secondary | ICD-10-CM | POA: Diagnosis not present

## 2020-05-08 DIAGNOSIS — E669 Obesity, unspecified: Secondary | ICD-10-CM | POA: Diagnosis not present

## 2020-05-08 DIAGNOSIS — I10 Essential (primary) hypertension: Secondary | ICD-10-CM | POA: Diagnosis not present

## 2020-05-08 DIAGNOSIS — I4510 Unspecified right bundle-branch block: Secondary | ICD-10-CM | POA: Diagnosis not present

## 2020-05-09 ENCOUNTER — Other Ambulatory Visit: Payer: Self-pay

## 2020-05-09 ENCOUNTER — Ambulatory Visit (INDEPENDENT_AMBULATORY_CARE_PROVIDER_SITE_OTHER): Payer: Medicare Other | Admitting: Orthopaedic Surgery

## 2020-05-09 ENCOUNTER — Encounter: Payer: Self-pay | Admitting: Orthopaedic Surgery

## 2020-05-09 DIAGNOSIS — Z96651 Presence of right artificial knee joint: Secondary | ICD-10-CM

## 2020-05-09 MED ORDER — HYDROCODONE-ACETAMINOPHEN 5-325 MG PO TABS: 1.0000 | ORAL_TABLET | Freq: Four times a day (QID) | ORAL | 0 refills | Status: DC | PRN

## 2020-05-09 NOTE — Progress Notes (Signed)
The patient be 2 weeks tomorrow status post a right total knee arthroplasty.  He is having home therapy now but they have only been out 1 time after him being at the skilled nursing facility.  He does have an OT with him who is in the advocate for him.  He is working on friends being able to transferred him to outpatient therapy.  I did give a note hoping to extend his home therapy visits while awake to set him up for outpatient physical therapy.  On exam the staples been removed from the right knee and Steri-Strips applied.  His extension is almost full and his flexion is to about 85 degrees.  His leg is swollen but his calf is soft.  The knee feels stable.  There is moderate swelling around the knee itself.  It is essential we get him into outpatient therapy after he has some more home therapy.  I will send in some hydrocodone for pain.  He is welcome to get some TED hose over-the-counter to work with swelling.  I would like to see him back in 4 weeks to see how his mobility is coming along.

## 2020-05-10 ENCOUNTER — Ambulatory Visit: Payer: Medicare Other | Admitting: Family Medicine

## 2020-05-10 ENCOUNTER — Other Ambulatory Visit: Payer: Self-pay

## 2020-05-10 DIAGNOSIS — Z96651 Presence of right artificial knee joint: Secondary | ICD-10-CM

## 2020-05-30 ENCOUNTER — Encounter: Payer: Self-pay | Admitting: Physical Therapy

## 2020-05-30 ENCOUNTER — Other Ambulatory Visit: Payer: Self-pay

## 2020-05-30 ENCOUNTER — Ambulatory Visit: Payer: Medicare Other | Attending: Orthopaedic Surgery | Admitting: Physical Therapy

## 2020-05-30 DIAGNOSIS — R600 Localized edema: Secondary | ICD-10-CM | POA: Diagnosis not present

## 2020-05-30 DIAGNOSIS — M25561 Pain in right knee: Secondary | ICD-10-CM | POA: Diagnosis not present

## 2020-05-30 DIAGNOSIS — M6281 Muscle weakness (generalized): Secondary | ICD-10-CM

## 2020-05-30 DIAGNOSIS — R262 Difficulty in walking, not elsewhere classified: Secondary | ICD-10-CM | POA: Diagnosis not present

## 2020-05-30 NOTE — Therapy (Signed)
[place], [place], [address] Phone: [phone]   Fax:  [phone]  Physical Therapy Evaluation  Patient Details  Name: Richard Harding MRN: [id number] Date of Birth: [birth year] Referring Provider (PT): Jean Rosenthal   Encounter Date: 05/30/2020   PT End of Session - 05/30/20 1149    Visit Number 1    Date for PT Re-Evaluation 07/30/20    PT Start Time 0930    PT Stop Time 1012    PT Time Calculation (min) 42 min    Activity Tolerance Patient tolerated treatment well;Patient limited by pain    Behavior During Therapy [facility] for tasks assessed/performed           Past Medical History:  Diagnosis Date  . Arthritis   . Arthritis of right knee 04/25/2020  . Carpal tunnel syndrome, bilateral   . Chickenpox   . Diabetes mellitus without complication ([place])   . Hyperlipemia   . Hypertension   . RBBB 04/17/2020   Noted on EKG    Past Surgical History:  Procedure Laterality Date  . BILATERAL CARPAL TUNNEL RELEASE    . CERVICAL FUSION    . COLONOSCOPY  2011  . TOTAL KNEE ARTHROPLASTY Right 04/26/2020   Procedure: RIGHT TOTAL KNEE ARTHROPLASTY;  Surgeon: Mcarthur Rossetti, MD;  Location: [facility];  Service: Orthopedics;  Laterality: Right;    There were no vitals filed for this visit.    Subjective Assessment - 05/30/20 0929    Subjective Pt reports to clinic after R TKA on 04/26/2020. Pt has been doing [facility] since d/c from hospital. Pt reports to clinic with [facility]; pt states that he only occasionaly used [facility] before surgery. Pt reports some shooting pain down the leg.    Pertinent History HTN, diabetes, arthritis    Limitations Sitting;Standing;Walking;House hold activities    Patient Stated Goals get rid of pain and "get back to normal"    Currently in Pain? Yes    Pain Score 1     Pain Location Knee    Pain Orientation Right    Pain Descriptors / Indicators  Aching;Sharp;Numbness    Pain Type Acute pain;Surgical pain    Pain Radiating Towards RLE    Pain Onset More than a month ago    Pain Frequency Intermittent    Aggravating Factors  walking, standing, bending knee    Pain Relieving Factors rest, ice               PT Assessment - 05/30/20 0001      Assessment   Medical Diagnosis R TKA    Referring Provider (PT) Jean Rosenthal    Onset Date/Surgical Date 04/26/20    Next MD Visit 06/10/2020      Precautions   Precautions None      Restrictions   Weight Bearing Restrictions No      Balance Screen   Has the patient fallen in the past 6 months No    Has the patient had a decrease in activity level because of a fear of falling?  No    Is the patient reluctant to leave their home because of a fear of falling?  No      Home Environment   Additional Comments 3 steps to enter; no problems getting in/out of house reported      Prior Function   Level of Independence Independent    Vocation Retired  Observation/Other Assessments-Edema    Edema Circumferential      Circumferential Edema   Circumferential - Right 44 cm mid patella    Circumferential - Left  39 cm mid patella      Sensation   Light Touch Impaired by gross assessment    Additional Comments diminished sensation on medial and lateral aspect of R knee      Functional Tests   Functional tests Sit to Stand      Sit to Stand   Comments leaning on LLE      ROM / Strength   AROM / PROM / Strength AROM;Strength;PROM      AROM   AROM Assessment Site Knee    Right/Left Knee Right    Right Knee Extension 8    Right Knee Flexion 90      PROM   PROM Assessment Site Knee    Right/Left Knee Right    Right Knee Extension 4    Right Knee Flexion 95      Strength   Overall Strength Comments RLE 4/5, LLE 5/5      Palpation   Palpation comment tender to palpation around R knee anteriorly      Ambulation/Gait   Gait Comments antalgic gait with SPC;  no LOB      Standardized Balance Assessment   Standardized Balance Assessment Timed Up and Go Test      Timed Up and Go Test   Normal TUG (seconds) 16   with SPC                     Objective measurements completed on examination: See above findings.       [place] Adult PT Treatment/Exercise - 05/30/20 0001      Exercises   Exercises Knee/Hip      Knee/Hip Exercises: Stretches   Knee: Self-Stretch to increase Flexion Right;1 rep;30 seconds    Other Knee/Hip Stretches knee extension stretch with quad contraction x10 hold 3 sec      Knee/Hip Exercises: Seated   Long Arc Quad Right;1 set;10 reps    Long Arc Quad Limitations difficulty attaining TKE; pain with LAQ                  PT Education - 05/30/20 1148    Education Details Pt educated on POC and HEP    Person(s) Educated Patient    Methods Explanation;Demonstration;Handout    Comprehension Verbalized understanding;Returned demonstration            PT Short Term Goals - 05/30/20 1154      PT SHORT TERM GOAL #1   Title Pt will be independent with HEP    Time 2    Period Weeks    Status New    Target Date 06/13/20             PT Long Term Goals - 05/30/20 1154      PT LONG TERM GOAL #1   Title Pt will demonstrate knee flexion ROM of 110    Baseline 95    Time 8    Period Weeks    Status New    Target Date 07/25/20      PT LONG TERM GOAL #2   Title Pt will demonstrate full knee extension ROM    Time 8    Period Weeks    Status New    Target Date 07/25/20      PT LONG TERM GOAL #3  Title Pt will demonstrate ability to ambulate >1000 ft over level/unlevel surfaces with supervision assist and LRAD    Time 8    Period Weeks    Status New    Target Date 07/25/20      PT LONG TERM GOAL #4   Title Pt will demonstrate reduction of swelling >3cm    Time 8    Period Weeks    Target Date 07/25/20      PT LONG TERM GOAL #5   Title Pt will demonstrate RLE MMT strength  equivalent to LLE    Time 8    Period Weeks    Status New    Target Date 07/25/20                  Plan - 05/30/20 1149    Clinical Impression Statement Pt reports to clinic s/p R TKA on 04/26/2020; d/c from [facility] on 6/1. Pt demonstrates R knee flexion of 95 deg and close to full knee extension. Pt ambulates with SPC and was using cane on occasion prior to surgery. Pt demonstrates increased edema/swelling of R knee. Pt would benefit from skilled PT to address the above impairments.    Personal Factors and Comorbidities Age;Comorbidity 1    Comorbidities arthritis    Examination-Activity Limitations Stand;Stairs;Squat;Locomotion Level;Transfers    Examination-Participation Restrictions Community Activity;Driving;Interpersonal Relationship    Stability/Clinical Decision Making Stable/Uncomplicated    Clinical Decision Making Low    Rehab Potential Good    PT Frequency 2x / week    PT Duration 8 weeks    PT Treatment/Interventions ADLs/Self Care Home Management;Cryotherapy;Electrical Stimulation;Moist Heat;Gait training;Stair training;Functional mobility training;Therapeutic activities;Therapeutic exercise;Balance training;Neuromuscular re-education;Manual techniques;Patient/family education;Passive range of motion;Vasopneumatic Device    PT Next Visit Plan initiate LE flexibility/strengthening, knee ROM, modalities/manual as indicated    PT Home Exercise Plan knee flexion stretch with OP, knee extension stretch w/ quad contraction, LAQ    Consulted and Agree with Plan of Care Patient           Patient will benefit from skilled therapeutic intervention in order to improve the following deficits and impairments:  Abnormal gait, Decreased range of motion, Difficulty walking, Pain, Decreased balance, Decreased mobility, Decreased strength, Impaired sensation  Visit Diagnosis: Muscle weakness (generalized)  Acute pain of right knee  Difficulty in walking, not elsewhere  classified  Localized edema     Problem List Patient Active Problem List   Diagnosis Date Noted  . 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 Arthritis of right knee 04/25/2020  . Hypokalemia 01/12/2019  . DM (diabetes mellitus) type II, controlled, with peripheral vascular disorder ([place]) 09/16/2017  . Essential hypertension 01/18/2014  . Hyperlipidemia LDL goal <70 01/18/2014  . Obesity (BMI 30-39.9) 01/18/2014   Amador Cunas, PT, DPT Donald Prose Lalla Laham 05/30/2020, 11:57 AM  [place], [place], [address] Phone: [phone]   Fax:  [phone]  Name: Richard Harding MRN: [id number] Date of Birth: 1946/01/14

## 2020-06-03 ENCOUNTER — Encounter: Payer: Self-pay | Admitting: Physical Therapy

## 2020-06-03 ENCOUNTER — Other Ambulatory Visit: Payer: Self-pay

## 2020-06-03 ENCOUNTER — Ambulatory Visit: Payer: Medicare Other | Admitting: Physical Therapy

## 2020-06-03 DIAGNOSIS — R600 Localized edema: Secondary | ICD-10-CM | POA: Diagnosis not present

## 2020-06-03 DIAGNOSIS — M6281 Muscle weakness (generalized): Secondary | ICD-10-CM | POA: Diagnosis not present

## 2020-06-03 DIAGNOSIS — R262 Difficulty in walking, not elsewhere classified: Secondary | ICD-10-CM

## 2020-06-03 DIAGNOSIS — M25561 Pain in right knee: Secondary | ICD-10-CM | POA: Diagnosis not present

## 2020-06-03 NOTE — Therapy (Signed)
[place], [place], [address] Phone: [phone]   Fax:  [phone]  Physical Therapy Treatment  Patient Details  Name: Richard Harding MRN: [id number] Date of Birth: Jan 01, 1946 Referring Provider (PT): Jean Rosenthal   Encounter Date: 06/03/2020   PT End of Session - 06/03/20 1002    Visit Number 2    Date for PT Re-Evaluation 07/30/20    PT Start Time 0926    PT Stop Time 1016    PT Time Calculation (min) 50 min    Activity Tolerance Patient tolerated treatment well    Behavior During Therapy [facility] for tasks assessed/performed           Past Medical History:  Diagnosis Date  . Arthritis   . Arthritis of right knee 04/25/2020  . Carpal tunnel syndrome, bilateral   . Chickenpox   . Diabetes mellitus without complication ([place])   . Hyperlipemia   . Hypertension   . RBBB 04/17/2020   Noted on EKG    Past Surgical History:  Procedure Laterality Date  . BILATERAL CARPAL TUNNEL RELEASE    . CERVICAL FUSION    . COLONOSCOPY  2011  . TOTAL KNEE ARTHROPLASTY Right 04/26/2020   Procedure: RIGHT TOTAL KNEE ARTHROPLASTY;  Surgeon: Mcarthur Rossetti, MD;  Location: [facility];  Service: Orthopedics;  Laterality: Right;    There were no vitals filed for this visit.   Subjective Assessment - 06/03/20 0930    Subjective Pt reports that he has had sciatic nerve pain in the past that her returned due to his knee. now he is having 2 pains, knee pain and sciatic nerve pain. Hard time sleeping at ing    Currently in Pain? Yes    Pain Score 3     Pain Location Knee    Pain Orientation Right                             [facility] Adult PT Treatment/Exercise - 06/03/20 0001      Knee/Hip Exercises: Aerobic   Nustep L3 x 5 min      Knee/Hip Exercises: Seated   Long Arc Quad Right;10 reps;3 sets    [employer] Weight 2 lbs.    Other Seated Knee/Hip Exercises RLE x10     Hamstring Curl Strengthening;Right;2 sets;10 reps    Hamstring Limitations red    Sit to Sand 2 sets;10 reps;without UE support   elevated UBE seat      Knee/Hip Exercises: Supine   Short Arc Quad Sets Right;2 sets;10 reps    Short Arc Quad Sets Limitations 2lb       Modalities   Modalities Vasopneumatic      Vasopneumatic   Number Minutes Vasopneumatic  10 minutes    Vasopnuematic Location  Knee    Vasopneumatic Pressure Medium    Vasopneumatic Temperature  34      Manual Therapy   Manual Therapy Passive ROM    Manual therapy comments SOme PROM taken to end range and held    Passive ROM RLE flex and Ext                     PT Short Term Goals - 06/03/20 1013      PT SHORT TERM GOAL #1   Title Pt will be independent with HEP    Status Achieved  PT Long Term Goals - 05/30/20 1154      PT LONG TERM GOAL #1   Title Pt will demonstrate knee flexion ROM of 110    Baseline 95    Time 8    Period Weeks    Status New    Target Date 07/25/20      PT LONG TERM GOAL #2   Title Pt will demonstrate full knee extension ROM    Time 8    Period Weeks    Status New    Target Date 07/25/20      PT LONG TERM GOAL #3   Title Pt will demonstrate ability to ambulate >1000 ft over level/unlevel surfaces with supervision assist and LRAD    Time 8    Period Weeks    Status New    Target Date 07/25/20      PT LONG TERM GOAL #4   Title Pt will demonstrate reduction of swelling >3cm    Time 8    Period Weeks    Target Date 07/25/20      PT LONG TERM GOAL #5   Title Pt will demonstrate RLE MMT strength equivalent to LLE    Time 8    Period Weeks    Status New    Target Date 07/25/20                 Plan - 06/03/20 1004    Clinical Impression Statement Pt tolerated an initial progression to TE well. Strong tactile cues needed to the back of knee to get a quad contraction. Rest needed in the second set of HS curls due to fatigue after 4 reps.  Cues needed with sit to stand for sequencing and to distribute  weight evenly    Personal Factors and Comorbidities Age;Comorbidity 1    Comorbidities arthritis    Examination-Activity Limitations Stand;Stairs;Squat;Locomotion Level;Transfers    Examination-Participation Restrictions Community Activity;Driving;Interpersonal Relationship    Stability/Clinical Decision Making Stable/Uncomplicated    Rehab Potential Good    PT Frequency 2x / week    PT Duration 8 weeks    PT Treatment/Interventions ADLs/Self Care Home Management;Cryotherapy;Electrical Stimulation;Moist Heat;Gait training;Stair training;Functional mobility training;Therapeutic activities;Therapeutic exercise;Balance training;Neuromuscular re-education;Manual techniques;Patient/family education;Passive range of motion;Vasopneumatic Device    PT Next Visit Plan initiate LE flexibility/strengthening, knee ROM, modalities/manual as indicated           Patient will benefit from skilled therapeutic intervention in order to improve the following deficits and impairments:  Abnormal gait, Decreased range of motion, Difficulty walking, Pain, Decreased balance, Decreased mobility, Decreased strength, Impaired sensation  Visit Diagnosis: Difficulty in walking, not elsewhere classified  Localized edema  Acute pain of right knee  Muscle weakness (generalized)     Problem List Patient Active Problem List   Diagnosis Date Noted  . 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 Arthritis of right knee 04/25/2020  . Hypokalemia 01/12/2019  . DM (diabetes mellitus) type II, controlled, with peripheral vascular disorder ([place]) 09/16/2017  . Essential hypertension 01/18/2014  . Hyperlipidemia LDL goal <70 01/18/2014  . Obesity (BMI 30-39.9) 01/18/2014    Scot Jun, PTA 06/03/2020, 10:13 AM  [place], [place], [address] Phone:  [phone]   Fax:  [phone]  Name: Richard Harding MRN: [id number] Date of Birth: [birth year]

## 2020-06-07 ENCOUNTER — Other Ambulatory Visit: Payer: Self-pay

## 2020-06-07 ENCOUNTER — Encounter: Payer: Self-pay | Admitting: Physical Therapy

## 2020-06-07 ENCOUNTER — Ambulatory Visit: Payer: Medicare Other | Admitting: Physical Therapy

## 2020-06-07 DIAGNOSIS — M25561 Pain in right knee: Secondary | ICD-10-CM | POA: Diagnosis not present

## 2020-06-07 DIAGNOSIS — R600 Localized edema: Secondary | ICD-10-CM

## 2020-06-07 DIAGNOSIS — R262 Difficulty in walking, not elsewhere classified: Secondary | ICD-10-CM

## 2020-06-07 DIAGNOSIS — M6281 Muscle weakness (generalized): Secondary | ICD-10-CM | POA: Diagnosis not present

## 2020-06-07 NOTE — Therapy (Signed)
[place], [place], [address] Phone: [phone]   Fax:  [phone]  Physical Therapy Treatment  Patient Details  Name: Richard Harding MRN: [id number] Date of Birth: 14-Apr-1946 Referring Provider (PT): Jean Rosenthal   Encounter Date: 06/07/2020   PT End of Session - 06/07/20 1129    Visit Number 3    Date for PT Re-Evaluation 07/30/20    PT Start Time 1052    PT Stop Time 1144    PT Time Calculation (min) 52 min    Activity Tolerance Patient tolerated treatment well    Behavior During Therapy [facility] for tasks assessed/performed           Past Medical History:  Diagnosis Date  . Arthritis   . Arthritis of right knee 04/25/2020  . Carpal tunnel syndrome, bilateral   . Chickenpox   . Diabetes mellitus without complication ([place])   . Hyperlipemia   . Hypertension   . RBBB 04/17/2020   Noted on EKG    Past Surgical History:  Procedure Laterality Date  . BILATERAL CARPAL TUNNEL RELEASE    . CERVICAL FUSION    . COLONOSCOPY  2011  . TOTAL KNEE ARTHROPLASTY Right 04/26/2020   Procedure: RIGHT TOTAL KNEE ARTHROPLASTY;  Surgeon: Mcarthur Rossetti, MD;  Location: [facility];  Service: Orthopedics;  Laterality: Right;    There were no vitals filed for this visit.   Subjective Assessment - 06/07/20 1054    Subjective Pt reports that his sciatic pain and his knee pain are both bothering him this morning.    Currently in Pain? Yes    Pain Score 2     Pain Location Knee    Pain Orientation Right                             [facility] Adult PT Treatment/Exercise - 06/07/20 0001      Knee/Hip Exercises: Aerobic   Nustep L5 x 6 min      Knee/Hip Exercises: Standing   Forward Step Up Both;1 set;10 reps;Step Height: 4"    Other Standing Knee Exercises alt step taps 6" with HH assist      Knee/Hip Exercises: Seated   Hamstring Curl Strengthening;Right;2 sets;10 reps     Hamstring Limitations red    Sit to Sand 2 sets;10 reps;without UE support      Knee/Hip Exercises: Supine   Short Arc Quad Sets Right;2 sets;10 reps    Short Arc Quad Sets Limitations 2#      Modalities   Modalities Vasopneumatic      Vasopneumatic   Number Minutes Vasopneumatic  15 minutes    Vasopnuematic Location  Knee    Vasopneumatic Pressure Medium    Vasopneumatic Temperature  34      Manual Therapy   Manual Therapy Passive ROM    Manual therapy comments SOme PROM taken to end range and held    Passive ROM RLE flex and Ext                     PT Short Term Goals - 06/03/20 1013      PT SHORT TERM GOAL #1   Title Pt will be independent with HEP    Status Achieved             PT Long Term Goals - 05/30/20 1154      PT  LONG TERM GOAL #1   Title Pt will demonstrate knee flexion ROM of 110    Baseline 95    Time 8    Period Weeks    Status New    Target Date 07/25/20      PT LONG TERM GOAL #2   Title Pt will demonstrate full knee extension ROM    Time 8    Period Weeks    Status New    Target Date 07/25/20      PT LONG TERM GOAL #3   Title Pt will demonstrate ability to ambulate >1000 ft over level/unlevel surfaces with supervision assist and LRAD    Time 8    Period Weeks    Status New    Target Date 07/25/20      PT LONG TERM GOAL #4   Title Pt will demonstrate reduction of swelling >3cm    Time 8    Period Weeks    Target Date 07/25/20      PT LONG TERM GOAL #5   Title Pt will demonstrate RLE MMT strength equivalent to LLE    Time 8    Period Weeks    Status New    Target Date 07/25/20                 Plan - 06/07/20 1132    Clinical Impression Statement Pt tolerated TE well; difficulty attaining TKE. Did better with HS curls today; however, still fatigues quickly. Pt demonstrated some instability with step ups and step taps. Responded well to vaso for pain/swelling.    PT Treatment/Interventions ADLs/Self Care Home  Management;Cryotherapy;Electrical Stimulation;Moist Heat;Gait training;Stair training;Functional mobility training;Therapeutic activities;Therapeutic exercise;Balance training;Neuromuscular re-education;Manual techniques;Patient/family education;Passive range of motion;Vasopneumatic Device    PT Next Visit Plan initiate LE flexibility/strengthening, knee ROM, modalities/manual as indicated    Consulted and Agree with Plan of Care Patient           Patient will benefit from skilled therapeutic intervention in order to improve the following deficits and impairments:  Abnormal gait, Decreased range of motion, Difficulty walking, Pain, Decreased balance, Decreased mobility, Decreased strength, Impaired sensation  Visit Diagnosis: Difficulty in walking, not elsewhere classified  Localized edema  Acute pain of right knee  Muscle weakness (generalized)     Problem List Patient Active Problem List   Diagnosis Date Noted  . 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 Arthritis of right knee 04/25/2020  . Hypokalemia 01/12/2019  . DM (diabetes mellitus) type II, controlled, with peripheral vascular disorder ([place]) 09/16/2017  . Essential hypertension 01/18/2014  . Hyperlipidemia LDL goal <70 01/18/2014  . Obesity (BMI 30-39.9) 01/18/2014   Amador Cunas, PT, DPT Donald Prose Martell Mcfadyen 06/07/2020, 11:34 AM  [place], [place], [address] Phone: [phone]   Fax:  [phone]  Name: Richard Harding MRN: [id number] Date of Birth: [birth year]

## 2020-06-10 ENCOUNTER — Other Ambulatory Visit: Payer: Self-pay

## 2020-06-10 ENCOUNTER — Ambulatory Visit (INDEPENDENT_AMBULATORY_CARE_PROVIDER_SITE_OTHER): Payer: Medicare Other | Admitting: Orthopaedic Surgery

## 2020-06-10 ENCOUNTER — Encounter: Payer: Self-pay | Admitting: Orthopaedic Surgery

## 2020-06-10 DIAGNOSIS — Z96651 Presence of right artificial knee joint: Secondary | ICD-10-CM

## 2020-06-10 NOTE — Progress Notes (Signed)
The patient is following up at 6 weeks status post a right total knee arthroplasty.  He has not driven yet.  He does ambulate with a cane.  He is off all narcotic pain medications.  On examination his knee extension is full.  His flexion is to about 95 to 100 degrees.  There is moderate swelling still.  There is no redness.  His incision is healed nicely.  He will continue to increase his activities as comfort allows.  He can get his knee submerged under water.  He can drop from my standpoint.  I have encouraged him to try his Robaxin at night if needed.  All questions and concerns were answered and addressed.  I would like to see him back in 4 weeks to see how he is doing overall but no x-rays are needed.

## 2020-06-11 ENCOUNTER — Encounter: Payer: Self-pay | Admitting: Physical Therapy

## 2020-06-11 ENCOUNTER — Ambulatory Visit: Payer: Medicare Other | Admitting: Physical Therapy

## 2020-06-11 DIAGNOSIS — M25561 Pain in right knee: Secondary | ICD-10-CM | POA: Diagnosis not present

## 2020-06-11 DIAGNOSIS — R262 Difficulty in walking, not elsewhere classified: Secondary | ICD-10-CM

## 2020-06-11 DIAGNOSIS — R600 Localized edema: Secondary | ICD-10-CM | POA: Diagnosis not present

## 2020-06-11 DIAGNOSIS — M6281 Muscle weakness (generalized): Secondary | ICD-10-CM

## 2020-06-11 NOTE — Therapy (Signed)
[place] Hard[place], [place], [address] Phone: [phone]   Fax:  [phone]  Physical Therapy Treatment  Patient Details  Name: Richard Harding MRN: [id number] Date of Birth: Oct 03, 1946 Referring Provider (PT): Jean Rosenthal   Encounter Date: 06/11/2020   PT End of Session - 06/11/20 1125    Visit Number 4    Date for PT Re-Evaluation 07/30/20    PT Start Time 1104    PT Stop Time 1150    PT Time Calculation (min) 46 min    Activity Tolerance Patient tolerated treatment well;Patient limited by pain    Behavior During Therapy [facility] for tasks assessed/performed           Past Medical History:  Diagnosis Date  . Arthritis   . Arthritis of right knee 04/25/2020  . Carpal tunnel syndrome, bilateral   . Chickenpox   . Diabetes mellitus without complication ([place])   . Hyperlipemia   . Hypertension   . RBBB 04/17/2020   Noted on EKG    Past Surgical History:  Procedure Laterality Date  . BILATERAL CARPAL TUNNEL RELEASE    . CERVICAL FUSION    . COLONOSCOPY  2011  . TOTAL KNEE ARTHROPLASTY Right 04/26/2020   Procedure: RIGHT TOTAL KNEE ARTHROPLASTY;  Surgeon: Mcarthur Rossetti, MD;  [facility];  Service: Orthopedics;  Laterality: Right;    There were no vitals filed for this visit.   Subjective Assessment - 06/11/20 1124    Subjective Pt reports that he fell off his bed last night while sleeping (bed is pretty high) and fell on both knees. Pt states that he is very sore and stiff today and but has not noticed any new clicking/popping or swelling.    Currently in Pain? Yes    Pain Score 6     Pain Location Knee    Pain Orientation Right              [facility] PT Assessment - 06/11/20 0001      Circumferential Edema   Circumferential - Right 41 cm     Circumferential - Left  39 cm                         [facility] Adult PT Treatment/Exercise - 06/11/20 0001       Knee/Hip Exercises: Aerobic   Nustep L3 x 8 min      Knee/Hip Exercises: Seated   Long Arc Quad Right;2 sets;10 reps    Hamstring Curl Right;2 sets;10 reps      Vasopneumatic   Number Minutes Vasopneumatic  15 minutes    Vasopnuematic Location  Knee    Vasopneumatic Pressure Medium    Vasopneumatic Temperature  34      Manual Therapy   Manual Therapy Passive ROM;Joint mobilization;Soft tissue mobilization    Manual therapy comments PROM to end range and held    Joint Mobilization gentle joint mobs to patella; gentle distraction with stretching    Soft tissue mobilization STM to quad and hamstring    Passive ROM RLE flexion and extension with overpressure                    PT Short Term Goals - 06/03/20 1013      PT SHORT TERM GOAL #1   Title Pt will be independent with HEP    Status Achieved  PT Long Term Goals - 05/30/20 1154      PT LONG TERM GOAL #1   Title Pt will demonstrate knee flexion ROM of 110    Baseline 95    Time 8    Period Weeks    Status New    Target Date 07/25/20      PT LONG TERM GOAL #2   Title Pt will demonstrate full knee extension ROM    Time 8    Period Weeks    Status New    Target Date 07/25/20      PT LONG TERM GOAL #3   Title Pt will demonstrate ability to ambulate >1000 ft over level/unlevel surfaces with supervision assist and LRAD    Time 8    Period Weeks    Status New    Target Date 07/25/20      PT LONG TERM GOAL #4   Title Pt will demonstrate reduction of swelling >3cm    Time 8    Period Weeks    Target Date 07/25/20      PT LONG TERM GOAL #5   Title Pt will demonstrate RLE MMT strength equivalent to LLE    Time 8    Period Weeks    Status New    Target Date 07/25/20                 Plan - 06/11/20 1126    Clinical Impression Statement Pt reports to clinic after fall last night out of his bed. Swelling had decreased since time of initial eval and has not increased since fall  last night. ROM is the same with pt reporting no increased difficulties walking or new issues. Pt is very stiff/sore today from fall and requests to take it easy. Focused this tx session on manual tx and ROM. Pt reported relief with vaso for pain/swelling.    PT Treatment/Interventions ADLs/Self Care Home Management;Cryotherapy;Electrical Stimulation;Moist Heat;Gait training;Stair training;Functional mobility training;Therapeutic activities;Therapeutic exercise;Balance training;Neuromuscular re-education;Manual techniques;Patient/family education;Passive range of motion;Vasopneumatic Device    PT Next Visit Plan initiate LE flexibility/strengthening, knee ROM, modalities/manual as indicated    Consulted and Agree with Plan of Care Patient           Patient will benefit from skilled therapeutic intervention in order to improve the following deficits and impairments:     Visit Diagnosis: Difficulty in walking, not elsewhere classified  Localized edema  Acute pain of right knee  Muscle weakness (generalized)     Problem List Patient Active Problem List   Diagnosis Date Noted  . 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 Arthritis of right knee 04/25/2020  . Hypokalemia 01/12/2019  . DM (diabetes mellitus) type II, controlled, with peripheral vascular disorder ([place]) 09/16/2017  . Essential hypertension 01/18/2014  . Hyperlipidemia LDL goal <70 01/18/2014  . Obesity (BMI 30-39.9) 01/18/2014   Amador Cunas, PT, DPT Donald Prose Felton Buczynski 06/11/2020, 11:29 AM  [place], [place], [address] Phone: [phone]   Fax:  [phone]  Name: Richard Harding MRN: [id number] Date of Birth: October 21, 1946

## 2020-06-12 ENCOUNTER — Telehealth: Payer: Self-pay | Admitting: Family Medicine

## 2020-06-12 NOTE — Telephone Encounter (Signed)
Left message for patient to schedule Annual Wellness Visit.  Please schedule with Nurse Health Advisor Martha Stanley, RN at [facility]  °

## 2020-06-12 NOTE — Telephone Encounter (Signed)
Patient returned call to the office. He states that he recently had surgery and wants to hold off on scheduling his AWV. He said that he will call us back when he's ready to schedule. He wants to focus on recovering from surgery right now.

## 2020-06-14 ENCOUNTER — Encounter: Payer: Self-pay | Admitting: Physical Therapy

## 2020-06-14 ENCOUNTER — Other Ambulatory Visit: Payer: Self-pay

## 2020-06-14 ENCOUNTER — Ambulatory Visit: Payer: Medicare Other | Admitting: Physical Therapy

## 2020-06-14 DIAGNOSIS — M25561 Pain in right knee: Secondary | ICD-10-CM | POA: Diagnosis not present

## 2020-06-14 DIAGNOSIS — R600 Localized edema: Secondary | ICD-10-CM

## 2020-06-14 DIAGNOSIS — M6281 Muscle weakness (generalized): Secondary | ICD-10-CM

## 2020-06-14 DIAGNOSIS — R262 Difficulty in walking, not elsewhere classified: Secondary | ICD-10-CM | POA: Diagnosis not present

## 2020-06-14 NOTE — Therapy (Signed)
[place], [place], [address] Phone: [phone]   Fax:  [phone]  Physical Therapy Treatment  Patient Details  Name: Richard Harding MRN: [id number] Date of Birth: 1946-12-09 Referring Provider (PT): Jean Rosenthal   Encounter Date: 06/14/2020   PT End of Session - 06/14/20 1021    Visit Number 5    Date for PT Re-Evaluation 07/30/20    PT Start Time 0930    PT Stop Time 1025    PT Time Calculation (min) 55 min    Activity Tolerance Patient tolerated treatment well    Behavior During Therapy [facility] for tasks assessed/performed           Past Medical History:  Diagnosis Date  . Arthritis   . Arthritis of right knee 04/25/2020  . Carpal tunnel syndrome, bilateral   . Chickenpox   . Diabetes mellitus without complication ([place])   . Hyperlipemia   . Hypertension   . RBBB 04/17/2020   Noted on EKG    Past Surgical History:  Procedure Laterality Date  . BILATERAL CARPAL TUNNEL RELEASE    . CERVICAL FUSION    . COLONOSCOPY  2011  . TOTAL KNEE ARTHROPLASTY Right 04/26/2020   Procedure: RIGHT TOTAL KNEE ARTHROPLASTY;  Surgeon: Mcarthur Rossetti, MD;  [facility];  Service: Orthopedics;  Laterality: Right;    There were no vitals filed for this visit.   Subjective Assessment - 06/14/20 0936    Subjective Pt reports he is feeling better since falling off bed earlier this week; soreness is subsiding.    Currently in Pain? Yes    Pain Score 3     Pain Location Knee    Pain Orientation Right                             [facility] Adult PT Treatment/Exercise - 06/14/20 0001      Knee/Hip Exercises: Aerobic   Recumbent Bike x6 min full revolutions      Knee/Hip Exercises: Machines for Strengthening   Cybex Knee Extension BLE 5# 1x10    Cybex Knee Flexion BLE 15# 2x10      Knee/Hip Exercises: Seated   Long Arc Quad Right;2 sets;10 reps    Long Arc  Quad Weight 2 lbs.    Hamstring Curl Right;2 sets;10 reps    Hamstring Limitations red    Sit to Sand 1 set;5 reps;without UE support      Vasopneumatic   Number Minutes Vasopneumatic  10 minutes    Vasopnuematic Location  Knee    Vasopneumatic Pressure Medium    Vasopneumatic Temperature  34      Manual Therapy   Manual Therapy Passive ROM    Passive ROM RLE flexion and extension with overpressure                    PT Short Term Goals - 06/03/20 1013      PT SHORT TERM GOAL #1   Title Pt will be independent with HEP    Status Achieved             PT Long Term Goals - 05/30/20 1154      PT LONG TERM GOAL #1   Title Pt will demonstrate knee flexion ROM of 110    Baseline 95    Time 8    Period Weeks    Status New  Target Date 07/25/20      PT LONG TERM GOAL #2   Title Pt will demonstrate full knee extension ROM    Time 8    Period Weeks    Status New    Target Date 07/25/20      PT LONG TERM GOAL #3   Title Pt will demonstrate ability to ambulate >1000 ft over level/unlevel surfaces with supervision assist and LRAD    Time 8    Period Weeks    Status New    Target Date 07/25/20      PT LONG TERM GOAL #4   Title Pt will demonstrate reduction of swelling >3cm    Time 8    Period Weeks    Target Date 07/25/20      PT LONG TERM GOAL #5   Title Pt will demonstrate RLE MMT strength equivalent to LLE    Time 8    Period Weeks    Status New    Target Date 07/25/20                 Plan - 06/14/20 1022    Clinical Impression Statement Pt states he has had no more falls since earlier this week; soreness is starting to subside. Began gentle machine interventions; pt tolerated well with difficulty attaining TKE. Pt reported relief with vaso for pain/swelling.    PT Treatment/Interventions ADLs/Self Care Home Management;Cryotherapy;Electrical Stimulation;Moist Heat;Gait training;Stair training;Functional mobility training;Therapeutic  activities;Therapeutic exercise;Balance training;Neuromuscular re-education;Manual techniques;Patient/family education;Passive range of motion;Vasopneumatic Device    Consulted and Agree with Plan of Care Patient           Patient will benefit from skilled therapeutic intervention in order to improve the following deficits and impairments:  Abnormal gait, Decreased range of motion, Difficulty walking, Pain, Decreased balance, Decreased mobility, Decreased strength, Impaired sensation  Visit Diagnosis: Difficulty in walking, not elsewhere classified  Localized edema  Acute pain of right knee  Muscle weakness (generalized)     Problem List Patient Active Problem List   Diagnosis Date Noted  . 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 Arthritis of right knee 04/25/2020  . Hypokalemia 01/12/2019  . DM (diabetes mellitus) type II, controlled, with peripheral vascular disorder ([place]) 09/16/2017  . Essential hypertension 01/18/2014  . Hyperlipidemia LDL goal <70 01/18/2014  . Obesity (BMI 30-39.9) 01/18/2014   Amador Cunas, PT, DPT Donald Prose [name] 06/14/2020, 10:25 AM  [place], [place], [address] Phone: [phone]   Fax:  [phone]  Name: Richard Harding MRN: [id number] Date of Birth: [birth year]

## 2020-06-18 ENCOUNTER — Other Ambulatory Visit: Payer: Self-pay

## 2020-06-18 ENCOUNTER — Ambulatory Visit: Payer: Medicare Other | Admitting: Physical Therapy

## 2020-06-18 ENCOUNTER — Encounter: Payer: Self-pay | Admitting: Physical Therapy

## 2020-06-18 DIAGNOSIS — M25561 Pain in right knee: Secondary | ICD-10-CM

## 2020-06-18 DIAGNOSIS — R600 Localized edema: Secondary | ICD-10-CM | POA: Diagnosis not present

## 2020-06-18 DIAGNOSIS — M6281 Muscle weakness (generalized): Secondary | ICD-10-CM | POA: Diagnosis not present

## 2020-06-18 DIAGNOSIS — R262 Difficulty in walking, not elsewhere classified: Secondary | ICD-10-CM

## 2020-06-18 NOTE — Therapy (Signed)
[place], [place], [address] Phone: [phone]   Fax:  [phone]  Physical Therapy Treatment  Patient Details  Name: Richard Harding MRN: [id number] Date of Birth: 01/02/1946 Referring Provider (PT): Jean Rosenthal   Encounter Date: 06/18/2020   PT End of Session - 06/18/20 1226    Visit Number 6    Date for PT Re-Evaluation 07/30/20    PT Start Time 1156    PT Stop Time 1234    PT Time Calculation (min) 38 min    Activity Tolerance Patient tolerated treatment well    Behavior During Therapy [facility] for tasks assessed/performed           Past Medical History:  Diagnosis Date  . Arthritis   . Arthritis of right knee 04/25/2020  . Carpal tunnel syndrome, bilateral   . Chickenpox   . Diabetes mellitus without complication ([place])   . Hyperlipemia   . Hypertension   . RBBB 04/17/2020   Noted on EKG    Past Surgical History:  Procedure Laterality Date  . BILATERAL CARPAL TUNNEL RELEASE    . CERVICAL FUSION    . COLONOSCOPY  2011  . TOTAL KNEE ARTHROPLASTY Right 04/26/2020   Procedure: RIGHT TOTAL KNEE ARTHROPLASTY;  Surgeon: Mcarthur Rossetti, MD;  Location: [facility];  Service: Orthopedics;  Laterality: Right;    There were no vitals filed for this visit.   Subjective Assessment - 06/18/20 1157    Subjective Have a little pain.    Currently in Pain? Yes    Pain Score 3     Pain Location Knee    Pain Orientation Right                             [facility] Adult PT Treatment/Exercise - 06/18/20 0001      Knee/Hip Exercises: Aerobic   Recumbent Bike x3 min full revolutions    Nustep L3 x 5 min      Knee/Hip Exercises: Machines for Strengthening   Cybex Knee Extension 5lb 2x10    Cybex Knee Flexion 20lb 2x10    Cybex Leg Press 20lb 2x10       Vasopneumatic   Number Minutes Vasopneumatic  10 minutes    Vasopnuematic Location  Knee    Vasopneumatic  Pressure Medium    Vasopneumatic Temperature  34      Manual Therapy   Manual Therapy Passive ROM    Passive ROM RLE flexion and extension with overpressure                    PT Short Term Goals - 06/03/20 1013      PT SHORT TERM GOAL #1   Title Pt will be independent with HEP    Status Achieved             PT Long Term Goals - 05/30/20 1154      PT LONG TERM GOAL #1   Title Pt will demonstrate knee flexion ROM of 110    Baseline 95    Time 8    Period Weeks    Status New    Target Date 07/25/20      PT LONG TERM GOAL #2   Title Pt will demonstrate full knee extension ROM    Time 8    Period Weeks    Status New    Target Date 07/25/20  PT LONG TERM GOAL #3   Title Pt will demonstrate ability to ambulate >1000 ft over level/unlevel surfaces with supervision assist and LRAD    Time 8    Period Weeks    Status New    Target Date 07/25/20      PT LONG TERM GOAL #4   Title Pt will demonstrate reduction of swelling >3cm    Time 8    Period Weeks    Target Date 07/25/20      PT LONG TERM GOAL #5   Title Pt will demonstrate RLE MMT strength equivalent to LLE    Time 8    Period Weeks    Status New    Target Date 07/25/20                 Plan - 06/18/20 1226    Clinical Impression Statement Pt 11 minutes late for today's treatment session. Cues to complete the full ROM with curls and extensions. Some pain at the eng range of PROM.    Personal Factors and Comorbidities Age;Comorbidity 1    Comorbidities arthritis    Examination-Activity Limitations Stand;Stairs;Squat;Locomotion Level;Transfers    Examination-Participation Restrictions Community Activity;Driving;Interpersonal Relationship    Stability/Clinical Decision Making Stable/Uncomplicated    Rehab Potential Good    PT Frequency 2x / week    PT Duration 8 weeks    PT Treatment/Interventions ADLs/Self Care Home Management;Cryotherapy;Electrical Stimulation;Moist Heat;Gait  training;Stair training;Functional mobility training;Therapeutic activities;Therapeutic exercise;Balance training;Neuromuscular re-education;Manual techniques;Patient/family education;Passive range of motion;Vasopneumatic Device    PT Next Visit Plan initiate LE flexibility/strengthening, knee ROM, modalities/manual as indicated           Patient will benefit from skilled therapeutic intervention in order to improve the following deficits and impairments:  Abnormal gait, Decreased range of motion, Difficulty walking, Pain, Decreased balance, Decreased mobility, Decreased strength, Impaired sensation  Visit Diagnosis: Muscle weakness (generalized)  Acute pain of right knee  Localized edema  Difficulty in walking, not elsewhere classified     Problem List Patient Active Problem List   Diagnosis Date Noted  . 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 Arthritis of right knee 04/25/2020  . Hypokalemia 01/12/2019  . DM (diabetes mellitus) type II, controlled, with peripheral vascular disorder ([place]) 09/16/2017  . Essential hypertension 01/18/2014  . Hyperlipidemia LDL goal <70 01/18/2014  . Obesity (BMI 30-39.9) 01/18/2014    Scot Jun, PTA 06/18/2020, 12:27 PM  [place], [place], [address] Phone: [phone]   Fax:  [phone]  Name: Richard Harding MRN: [id number] Date of Birth: [birth year]

## 2020-06-21 ENCOUNTER — Ambulatory Visit: Payer: Medicare Other | Attending: Orthopaedic Surgery | Admitting: Physical Therapy

## 2020-06-21 ENCOUNTER — Encounter: Payer: Self-pay | Admitting: Physical Therapy

## 2020-06-21 ENCOUNTER — Other Ambulatory Visit: Payer: Self-pay

## 2020-06-21 DIAGNOSIS — M6281 Muscle weakness (generalized): Secondary | ICD-10-CM | POA: Diagnosis not present

## 2020-06-21 DIAGNOSIS — R262 Difficulty in walking, not elsewhere classified: Secondary | ICD-10-CM

## 2020-06-21 DIAGNOSIS — R600 Localized edema: Secondary | ICD-10-CM

## 2020-06-21 DIAGNOSIS — M25561 Pain in right knee: Secondary | ICD-10-CM

## 2020-06-21 NOTE — Therapy (Addendum)
[place], [place], [address] Phone: [phone]   Fax:  [phone]  Physical Therapy Treatment  Patient Details  Name: Havish Petties MRN: [id number] Date of Birth: November 03, 1946 Referring Provider (PT): Jean Rosenthal   Encounter Date: 06/21/2020   PT End of Session - 06/21/20 1136     Visit Number 7    Date for PT Re-Evaluation 07/30/20    PT Start Time 1104    PT Stop Time 1148    PT Time Calculation (min) 44 min    Activity Tolerance Patient tolerated treatment well    Behavior During Therapy [facility] for tasks assessed/performed             Past Medical History:  Diagnosis Date   Arthritis    Arthritis of right knee 04/25/2020   Carpal tunnel syndrome, bilateral    Chickenpox    Diabetes mellitus without complication ([place])    Hyperlipemia    Hypertension    RBBB 04/17/2020   Noted on EKG    Past Surgical History:  Procedure Laterality Date   BILATERAL CARPAL TUNNEL RELEASE     CERVICAL FUSION     COLONOSCOPY  2011   TOTAL KNEE ARTHROPLASTY Right 04/26/2020   Procedure: RIGHT TOTAL KNEE ARTHROPLASTY;  Surgeon: Mcarthur Rossetti, MD;  [facility];  Service: Orthopedics;  Laterality: Right;    There were no vitals filed for this visit.   Subjective Assessment - 06/21/20 1107     Subjective Pt reports he is doing well    Currently in Pain? Yes    Pain Score 1     Pain Location Knee    Pain Orientation Right                               [facility] Adult PT Treatment/Exercise - 06/21/20 0001       Knee/Hip Exercises: Aerobic   Recumbent Bike x4 min full revolutions    Nustep L5 x 5 min      Knee/Hip Exercises: Machines for Strengthening   Cybex Knee Extension 5lb 2x15    Cybex Knee Flexion 20lb 2x10    Cybex Leg Press 20lb 2x10       Vasopneumatic   Number Minutes Vasopneumatic  12 minutes    Vasopnuematic Location  Knee     Vasopneumatic Pressure Medium    Vasopneumatic Temperature  34      Manual Therapy   Manual Therapy Passive ROM    Passive ROM RLE flexion and extension with overpressure                      PT Short Term Goals - 06/03/20 1013       PT SHORT TERM GOAL #1   Title Pt will be independent with HEP    Status Achieved               PT Long Term Goals - 05/30/20 1154       PT LONG TERM GOAL #1   Title Pt will demonstrate knee flexion ROM of 110    Baseline 95    Time 8    Period Weeks    Status New    Target Date 07/25/20      PT LONG TERM GOAL #2   Title Pt will demonstrate full knee extension ROM    Time 8  Period Weeks    Status New    Target Date 07/25/20      PT LONG TERM GOAL #3   Title Pt will demonstrate ability to ambulate >1000 ft over level/unlevel surfaces with supervision assist and LRAD    Time 8    Period Weeks    Status New    Target Date 07/25/20      PT LONG TERM GOAL #4   Title Pt will demonstrate reduction of swelling >3cm    Time 8    Period Weeks    Target Date 07/25/20      PT LONG TERM GOAL #5   Title Pt will demonstrate RLE MMT strength equivalent to LLE    Time 8    Period Weeks    Status New    Target Date 07/25/20                   Plan - 06/21/20 1137     Clinical Impression Statement Pt required cuing to complete full ROM knee ext/flex. Able to tolerate greater knee flexion with bike this rx. Gait without AD around clinic; cues for knee flexion during gait.    PT Treatment/Interventions ADLs/Self Care Home Management;Cryotherapy;Electrical Stimulation;Moist Heat;Gait training;Stair training;Functional mobility training;Therapeutic activities;Therapeutic exercise;Balance training;Neuromuscular re-education;Manual techniques;Patient/family education;Passive range of motion;Vasopneumatic Device    PT Next Visit Plan progress LE flexibility/strengthening, knee ROM, modalities/manual as indicated     Consulted and Agree with Plan of Care Patient             Patient will benefit from skilled therapeutic intervention in order to improve the following deficits and impairments:  Abnormal gait, Decreased range of motion, Difficulty walking, Pain, Decreased balance, Decreased mobility, Decreased strength, Impaired sensation  Visit Diagnosis: Muscle weakness (generalized)  Localized edema  Acute pain of right knee  Difficulty in walking, not elsewhere classified     Problem List Patient Active Problem List   Diagnosis Date Noted   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/26/2020   Arthritis of right knee 04/25/2020   Hypokalemia 01/12/2019   DM (diabetes mellitus) type II, controlled, with peripheral vascular disorder ([place]) 09/16/2017   Essential hypertension 01/18/2014   Hyperlipidemia LDL goal <70 01/18/2014   Obesity (BMI 30-39.9) 01/18/2014   Amador Cunas, PT, DPT Donald Prose Kolsen Choe 06/21/2020, 11:38 AM  [place] [address] [place], [place], [address] Phone: [phone]   Fax:  [phone]  Name: Burch Marchuk MRN: [id number] Date of Birth: 1946/04/23   PHYSICAL THERAPY DISCHARGE SUMMARY  Visits from Start of Care:  7 Plan: Patient agrees to discharge.  Patient goals were partially met. Patient is being discharged due to - not returning since last visit.   Discharge updated today to close old episode. Signing therapist did not treat patient. Goals and progress not updated, just doing d/c.   Lyndee Hensen, PT, DPT 10:04 AM  11/06/22

## 2020-06-28 ENCOUNTER — Telehealth: Payer: Self-pay | Admitting: Orthopaedic Surgery

## 2020-06-28 ENCOUNTER — Telehealth: Payer: Self-pay

## 2020-06-28 NOTE — Telephone Encounter (Signed)
Rod Holler ( friend of patient) called with added note for Dr. Ninfa Linden. [name] stated insurance company denied hospitalization and surgery due to none complicated medical condition. [name] also stated insurance company explanation for denial. [name] is asking for Dr. Ninfa Linden to specify complicated medical condition for insurance company to approve hospitalization and surgery. [name] phone number is [phone]

## 2020-06-28 NOTE — Telephone Encounter (Signed)
Yes, to get any info of patient, he needs to put her on his Hippa

## 2020-06-28 NOTE — Telephone Encounter (Signed)
Richard Harding called in saying patient needs medical necessity letter for insurance for his knee surgery. Health insurance denied hospitalization and surgery . She also asked does she need to be on his hippa form for our facility she will be writing the appeal.

## 2020-06-28 NOTE — Telephone Encounter (Signed)
This is not something I can address with a "friend" of the patient. This note should be also forwarded to [facility] in the office or anyone who can help answer as to why the patient's knee replacement was not covered under her insurance because usually we have to get authorization from insurance to even do elective total joint replacement surgery. The insurance company will have all our notes stating why the surgery was recommended and offered as well as the hospital notes as to why he needed to recover in the hospital after surgery and why this was medically necessary.

## 2020-07-01 NOTE — Telephone Encounter (Signed)
Can you help me at all with this?

## 2020-07-02 NOTE — Telephone Encounter (Signed)
I reviewed his account.  [employer] paid his surgery on 05-09-20.  I cannot see the hospital bill.  I left a message for his friend to have  him call and I will discuss his account since we don't have his permission to speak with anyone but him.

## 2020-07-08 ENCOUNTER — Ambulatory Visit (INDEPENDENT_AMBULATORY_CARE_PROVIDER_SITE_OTHER): Payer: Medicare Other | Admitting: Orthopaedic Surgery

## 2020-07-08 ENCOUNTER — Ambulatory Visit (INDEPENDENT_AMBULATORY_CARE_PROVIDER_SITE_OTHER): Payer: Medicare Other

## 2020-07-08 ENCOUNTER — Encounter: Payer: Self-pay | Admitting: Orthopaedic Surgery

## 2020-07-08 ENCOUNTER — Other Ambulatory Visit: Payer: Self-pay

## 2020-07-08 VITALS — Ht 69.0 in | Wt 198.0 lb

## 2020-07-08 DIAGNOSIS — Z96651 Presence of right artificial knee joint: Secondary | ICD-10-CM

## 2020-07-08 NOTE — Progress Notes (Signed)
The patient is a very pleasant 74 year old who is 10 weeks out from a right total knee arthroplasty.  This was a press-fit implant.  He does have some swelling from his surgery to be expected.  He does report that he did fall out of bed last month landing on both knees.  He was concerned enough for his right operative knee that he would like to have x-rays today.  He is just taking over-the-counter pain medication.  He is ambulating with a cane.  On examination of his right knee it is ligamentously stable with full range of motion.  There is moderate swelling to be expected but no redness.  His incision looks good.  Two views of the right knee are reviewed today.  The implants look good and there is no evidence of fracture or malalignment.  He was pleased to see these as well.  I counseled him extensively about his right knee.  All questions and concerns were answered and addressed.  The next time we really need to see him back would be 6 months from now.  At that visit we'll have an AP and lateral of his right operative knee.  If there is any issues before then he'll let us know.

## 2020-09-03 ENCOUNTER — Other Ambulatory Visit: Payer: Self-pay

## 2020-09-04 ENCOUNTER — Telehealth: Payer: Self-pay | Admitting: Family Medicine

## 2020-09-04 ENCOUNTER — Encounter: Payer: Self-pay | Admitting: Family Medicine

## 2020-09-04 ENCOUNTER — Ambulatory Visit (INDEPENDENT_AMBULATORY_CARE_PROVIDER_SITE_OTHER): Payer: Medicare Other | Admitting: Family Medicine

## 2020-09-04 VITALS — BP 120/72 | HR 82 | Temp 97.9°F | Ht 69.0 in | Wt 196.2 lb

## 2020-09-04 DIAGNOSIS — Z5320 Procedure and treatment not carried out because of patient's decision for unspecified reasons: Secondary | ICD-10-CM | POA: Insufficient documentation

## 2020-09-04 DIAGNOSIS — Z23 Encounter for immunization: Secondary | ICD-10-CM

## 2020-09-04 DIAGNOSIS — E119 Type 2 diabetes mellitus without complications: Secondary | ICD-10-CM

## 2020-09-04 DIAGNOSIS — I10 Essential (primary) hypertension: Secondary | ICD-10-CM | POA: Diagnosis not present

## 2020-09-04 DIAGNOSIS — E785 Hyperlipidemia, unspecified: Secondary | ICD-10-CM

## 2020-09-04 MED ORDER — FLUTICASONE PROPIONATE 0.05 % EX CREA: TOPICAL_CREAM | Freq: Two times a day (BID) | CUTANEOUS | 2 refills | Status: DC

## 2020-09-04 MED ORDER — ATORVASTATIN CALCIUM 10 MG PO TABS: 10.0000 mg | ORAL_TABLET | Freq: Every day | ORAL | 3 refills | Status: DC

## 2020-09-04 NOTE — Addendum Note (Signed)
Addended by: Konrad Saha on: 09/04/2020 04:13 PM   Modules accepted: Orders

## 2020-09-04 NOTE — Progress Notes (Signed)
  Chief Complaint  Patient presents with  . Follow-up    6 month f/u DM, HTN, cholesterol,wants Rx for Cutivate Cream 0.05%    HPI: *Richard Harding is a 74 y.o. male here for DM, HTN, hyperlipidemia follow-up. For his DM, pt takes metformin 500mg daily. For his HTN, pt takes metoprolol 100mg daily, chlorthalidone 25mg daily. For his cholesterol, pt is on lovaza 1gm daily, fenofibrate 54mg daily, red yeast rice.  Pt does not check BS at home. His machine has not been working Hypoglycemia/Hypergylcemic episodes: no  Diet: pt eats a lot of fried foods (uses olive oil) but does eat a lot of chicken and fish, no bacon or red meat; he does eat a lot of carbs - french fries, bread, rice Exercise: limited d/t knee pain  He would like flu vaccine today.   Lab Results  Component Value Date   HGBA1C 6.5 02/29/2020   Lab Results  Component Value Date   MICROALBUR 1.3 02/29/2020   Lab Results  Component Value Date   CREATININE 1.10 04/28/2020   Lab Results  Component Value Date   CHOL 202 (H) 02/29/2020   HDL 33.40 (L) 02/29/2020   LDLDIRECT 128.0 02/29/2020   TRIG (H) 02/29/2020    407.0 Triglyceride is over 400; calculations on Lipids are invalid.   CHOLHDL 6 02/29/2020    The 10-year ASCVD risk score (Goff DC Jr., et al., 2013) is: 44.5%   Values used to calculate the score:     Age: 74 years     Sex: Male     Is Non-Hispanic African American: No     Diabetic: Yes     Tobacco smoker: No     Systolic Blood Pressure: 120 mmHg     Is BP treated: Yes     HDL Cholesterol: 33.4 mg/dL     Total Cholesterol: 202 mg/dL   Past Medical History:  Diagnosis Date  . Arthritis   . Arthritis of right knee 04/25/2020  . Carpal tunnel syndrome, bilateral   . Chickenpox   . Diabetes mellitus without complication (HCC)   . Hyperlipemia   . Hypertension   . RBBB 04/17/2020   Noted on EKG    Past Surgical History:  Procedure Laterality Date  . BILATERAL CARPAL TUNNEL  RELEASE    . CERVICAL FUSION    . COLONOSCOPY  2011  . TOTAL KNEE ARTHROPLASTY Right 04/26/2020   Procedure: RIGHT TOTAL KNEE ARTHROPLASTY;  Surgeon: Blackman, Christopher Y, MD;  Location: [facility];  Service: Orthopedics;  Laterality: Right;    Social History   Socioeconomic History  . Marital status: Single    Spouse name: Not on file  . Number of children: Not on file  . Years of education: Not on file  . Highest education level: Not on file  Occupational History  . Not on file  Tobacco Use  . Smoking status: Never Smoker  . Smokeless tobacco: Never Used  Vaping Use  . Vaping Use: Never used  Substance and Sexual Activity  . Alcohol use: No  . Drug use: No  . Sexual activity: Not on file  Other Topics Concern  . Not on file  Social History Narrative  . Not on file   Social Determinants of Health   Financial Resource Strain:   . Difficulty of Paying Living Expenses: Not on file  Food Insecurity:   . Worried About Running Out of Food in the Last Year: Not on file  . Ran   Out of Food in the Last Year: Not on file  Transportation Needs:   . Lack of Transportation (Medical): Not on file  . Lack of Transportation (Non-Medical): Not on file  Physical Activity:   . Days of Exercise per Week: Not on file  . Minutes of Exercise per Session: Not on file  Stress:   . Feeling of Stress : Not on file  Social Connections:   . Frequency of Communication with Friends and Family: Not on file  . Frequency of Social Gatherings with Friends and Family: Not on file  . Attends Religious Services: Not on file  . Active Member of Clubs or Organizations: Not on file  . Attends Archivist Meetings: Not on file  . Marital Status: Not on file  Intimate Partner Violence:   . Fear of Current or Ex-Partner: Not on file  . Emotionally Abused: Not on file  . Physically Abused: Not on file  . Sexually Abused: Not on file    History reviewed. No pertinent family history.    Immunization History  Administered Date(s) Administered  . Influenza, High Dose Seasonal PF 09/16/2015, 09/14/2017, 08/31/2019  . Influenza-Unspecified 12/01/2018  . Moderna SARS-COVID-2 Vaccination 02/03/2020, 03/02/2020  . Pneumococcal Conjugate-13 09/16/2015  . Pneumococcal Polysaccharide-23 01/18/2014  . Tdap 01/17/2012  . Zoster 02/21/2014  . Zoster Recombinat (Shingrix) 11/25/2018, 05/28/2019    Outpatient Encounter Medications as of 09/04/2020  Medication Sig  . Ascorbic Acid (VITAMIN C) 1000 MG tablet Take 2,000 mg by mouth at bedtime.   Marland Kitchen aspirin EC 81 MG EC tablet Take 1 tablet (81 mg total) by mouth 2 (two) times daily after a meal.  . blood glucose meter kit and supplies KIT Check blood glucose once daily. E11.9.  Marland Kitchen chlorthalidone (HYGROTON) 25 MG tablet Take 1 tablet (25 mg total) by mouth daily.  . fenofibrate 54 MG tablet Take 1qd (plz schedule virtual visit for January) (Patient taking differently: Take 54 mg by mouth daily. )  . Flaxseed, Linseed, (FLAX SEED OIL PO) Take 1 capsule by mouth daily.   Marland Kitchen GLUCOSA-CHONDR-NA CHONDR-MSM PO Take 2 capsules by mouth at bedtime.   . metFORMIN (GLUCOPHAGE-XR) 500 MG 24 hr tablet Take 1 tablet (500 mg total) by mouth every evening.  . metoprolol tartrate (LOPRESSOR) 50 MG tablet Take 1 tablet (50 mg total) by mouth 2 (two) times daily. (Patient taking differently: Take 100 mg by mouth daily. Takes evening only)  . niacin 500 MG tablet Take 1 tablet (500 mg total) by mouth daily.  Marland Kitchen omega-3 acid ethyl esters (LOVAZA) 1 g capsule Take 1 capsule (1 g total) by mouth 4 (four) times daily.  . Red Yeast Rice 600 MG CAPS Take 1 capsule (600 mg total) by mouth daily.  . fluticasone (CUTIVATE) 0.05 % cream Apply topically 2 (two) times daily.  . [DISCONTINUED] HYDROcodone-acetaminophen (NORCO/VICODIN) 5-325 MG tablet Take 1 tablet by mouth every 6 (six) hours as needed for moderate pain. (Patient not taking: Reported on 09/04/2020)  .  [DISCONTINUED] methocarbamol (ROBAXIN) 500 MG tablet Take 1 tablet (500 mg total) by mouth every 6 (six) hours as needed for muscle spasms. (Patient not taking: Reported on 09/04/2020)  . [DISCONTINUED] oxyCODONE (OXY IR/ROXICODONE) 5 MG immediate release tablet Take 1-2 tablets (5-10 mg total) by mouth every 4 (four) hours as needed for moderate pain (pain score 4-6). (Patient not taking: Reported on 09/04/2020)  . [DISCONTINUED] potassium chloride (KLOR-CON) 10 MEQ tablet Take 1 tablet (10 mEq total) by mouth  daily for 4 days. (Patient not taking: Reported on 04/15/2020)   No facility-administered encounter medications on file as of 09/04/2020.     ROS: Gen: no fever, chills  Eyes: no blurry vision, double vision Resp: no cough, wheeze,SOB CV: no CP, palpitations, LE edema,  GI: no heartburn, n/v/d/c, abd pain GU: no dysuria, urgency, frequency, hematuria  Neuro: no dizziness, headache, weakness   No Known Allergies  BP 120/72   Pulse 82   Temp 97.9 F (36.6 C) (Temporal)   Ht 5' 9" (1.753 m)   Wt 196 lb 3.2 oz (89 kg)   SpO2 96%   BMI 28.97 kg/m    BP Readings from Last 3 Encounters:  09/04/20 120/72  04/30/20 125/61  04/17/20 (!) 159/75   Pulse Readings from Last 3 Encounters:  09/04/20 82  04/30/20 90  04/17/20 94   Wt Readings from Last 3 Encounters:  09/04/20 196 lb 3.2 oz (89 kg)  07/08/20 198 lb (89.8 kg)  04/26/20 194 lb (88 kg)     Physical Exam Constitutional:      General: He is not in acute distress.    Appearance: Normal appearance. He is not ill-appearing.  Cardiovascular:     Rate and Rhythm: Normal rate and regular rhythm.     Pulses: Normal pulses.  Pulmonary:     Effort: Pulmonary effort is normal. No respiratory distress.     Breath sounds: Normal breath sounds.  Musculoskeletal:     Right lower leg: No edema.     Left lower leg: No edema.  Skin:    General: Skin is warm and dry.  Neurological:     General: No focal deficit present.      Mental Status: He is alert and oriented to person, place, and time.  Psychiatric:        Mood and Affect: Mood normal.        Behavior: Behavior normal.      A/P:  1. Essential hypertension - controlled, at goal - cont current meds - Comprehensive metabolic panel  2. Type 2 diabetes mellitus without complication, without long-term current use of insulin (HCC) - cont metformin XR 550m daily - Lipid panel - Hemoglobin A1c - Comprehensive metabolic panel  3. Hyperlipidemia LDL goal <70 - takes lovaza, fenofibrate, red yeast rice - previously declined statin d/t concerns about memory issues but is now agreeable Rx: - atorvastatin (LIPITOR) 10 MG tablet; Take 1 tablet (10 mg total) by mouth daily.  Dispense: 90 tablet; Refill: 3 - Comprehensive metabolic panel - Lipid panel  -f/u in 6 mo  This visit occurred during the SARS-CoV-2 public health emergency.  Safety protocols were in place, including screening questions prior to the visit, additional usage of staff PPE, and extensive cleaning of exam room while observing appropriate contact time as indicated for disinfecting solutions.

## 2020-09-04 NOTE — Telephone Encounter (Signed)
FYI: Patient would like a copy of his labs mailed to him once they are back and reviewed.

## 2020-09-05 ENCOUNTER — Encounter: Payer: Self-pay | Admitting: Family Medicine

## 2020-09-05 LAB — COMPREHENSIVE METABOLIC PANEL
ALT: 16 U/L (ref 0–53)
AST: 18 U/L (ref 0–37)
Albumin: 4.5 g/dL (ref 3.5–5.2)
Alkaline Phosphatase: 38 U/L — ABNORMAL LOW (ref 39–117)
BUN: 14 mg/dL (ref 6–23)
CO2: 33 mEq/L — ABNORMAL HIGH (ref 19–32)
Calcium: 10.4 mg/dL (ref 8.4–10.5)
Chloride: 98 mEq/L (ref 96–112)
Creatinine, Ser: 1.04 mg/dL (ref 0.40–1.50)
GFR: 69.96 mL/min (ref >60.00)
Glucose, Bld: 104 mg/dL — ABNORMAL HIGH (ref 70–99)
Potassium: 4.3 mEq/L (ref 3.5–5.1)
Sodium: 138 mEq/L (ref 135–145)
Total Bilirubin: 0.5 mg/dL (ref 0.2–1.2)
Total Protein: 7.6 g/dL (ref 6.0–8.3)

## 2020-09-05 LAB — LIPID PANEL
Cholesterol: 183 mg/dL (ref 0–200)
HDL: 38.8 mg/dL — ABNORMAL LOW (ref >39.00)
NonHDL: 143.73
Total CHOL/HDL Ratio: 5
Triglycerides: 295 mg/dL — ABNORMAL HIGH (ref 0.0–149.0)
VLDL: 59 mg/dL — ABNORMAL HIGH (ref 0.0–40.0)

## 2020-09-05 LAB — LDL CHOLESTEROL, DIRECT: Direct LDL: 116 mg/dL

## 2020-09-05 LAB — HEMOGLOBIN A1C: Hgb A1c MFr Bld: 6.8 % — ABNORMAL HIGH (ref 4.6–6.5)

## 2020-09-06 NOTE — Telephone Encounter (Signed)
Lab results printed along with letter with the My Chart message to home address. Dm/cma

## 2020-09-26 NOTE — Telephone Encounter (Signed)
Called patient and he did receive the labs as well as letter with recommendations.  Dm/cma

## 2020-10-11 ENCOUNTER — Ambulatory Visit (INDEPENDENT_AMBULATORY_CARE_PROVIDER_SITE_OTHER): Payer: Medicare Other | Admitting: Nurse Practitioner

## 2020-10-11 ENCOUNTER — Other Ambulatory Visit: Payer: Self-pay

## 2020-10-11 ENCOUNTER — Encounter: Payer: Self-pay | Admitting: Nurse Practitioner

## 2020-10-11 ENCOUNTER — Ambulatory Visit (INDEPENDENT_AMBULATORY_CARE_PROVIDER_SITE_OTHER): Payer: Medicare Other

## 2020-10-11 ENCOUNTER — Telehealth: Payer: Self-pay | Admitting: Nurse Practitioner

## 2020-10-11 VITALS — BP 112/66 | HR 88 | Temp 97.3°F | Ht 69.0 in | Wt 195.8 lb

## 2020-10-11 DIAGNOSIS — M19071 Primary osteoarthritis, right ankle and foot: Secondary | ICD-10-CM | POA: Diagnosis not present

## 2020-10-11 DIAGNOSIS — M79674 Pain in right toe(s): Secondary | ICD-10-CM

## 2020-10-11 MED ORDER — NAPROXEN 500 MG PO TBEC: 500.0000 mg | DELAYED_RELEASE_TABLET | Freq: Two times a day (BID) | ORAL | 0 refills | Status: DC

## 2020-10-11 NOTE — Progress Notes (Signed)
Subjective:  Patient ID: Richard Harding, male    DOB: 16-May-1946  Age: 74 y.o. MRN: [id number]  CC: Acute Visit (Pt c/o possible splinter in right foot and possible infection x2-3 months. Pt states he has tried to fix it but has no relief . The pain is right under the big toe)  Foot Injury  The incident occurred at home. There was no injury mechanism. The pain is present in the right foot. The quality of the pain is described as aching. The pain is moderate. The pain has been constant since onset. Associated symptoms include an inability to bear weight. Pertinent negatives include no loss of motion, loss of sensation, muscle weakness, numbness or tingling. It is unknown if a foreign body is present. The symptoms are aggravated by weight bearing and palpation. He has tried nothing for the symptoms.   Reviewed past Medical, Social and Family history today.  Outpatient Medications Prior to Visit  Medication Sig Dispense Refill  . Ascorbic Acid (VITAMIN C) 1000 MG tablet Take 2,000 mg by mouth at bedtime.     Marland Kitchen aspirin EC 81 MG EC tablet Take 1 tablet (81 mg total) by mouth 2 (two) times daily after a meal. 30 tablet 0  . atorvastatin (LIPITOR) 10 MG tablet Take 1 tablet (10 mg total) by mouth daily. 90 tablet 3  . blood glucose meter kit and supplies KIT Check blood glucose once daily. E11.9. 1 each 0  . chlorthalidone (HYGROTON) 25 MG tablet Take 1 tablet (25 mg total) by mouth daily. 90 tablet 3  . fenofibrate 54 MG tablet Take 1qd (plz schedule virtual visit for January) (Patient taking differently: Take 54 mg by mouth daily. ) 90 tablet 3  . Flaxseed, Linseed, (FLAX SEED OIL PO) Take 1 capsule by mouth daily.     . fluticasone (CUTIVATE) 0.05 % cream Apply topically 2 (two) times daily. 60 g 2  . GLUCOSA-CHONDR-NA CHONDR-MSM PO Take 2 capsules by mouth at bedtime.     . metFORMIN (GLUCOPHAGE-XR) 500 MG 24 hr tablet Take 1 tablet (500 mg total) by mouth every evening. 90 tablet 3  .  metoprolol tartrate (LOPRESSOR) 50 MG tablet Take 1 tablet (50 mg total) by mouth 2 (two) times daily. (Patient taking differently: Take 100 mg by mouth daily. Takes evening only) 180 tablet 3  . niacin 500 MG tablet Take 1 tablet (500 mg total) by mouth daily. 90 tablet 3  . omega-3 acid ethyl esters (LOVAZA) 1 g capsule Take 1 capsule (1 g total) by mouth 4 (four) times daily. 360 capsule 3  . Red Yeast Rice 600 MG CAPS Take 1 capsule (600 mg total) by mouth daily.     No facility-administered medications prior to visit.    ROS See HPI  Objective:  BP 112/66 (BP Location: Left Arm, Patient Position: Sitting, Cuff Size: Large)   Pulse 88   Temp (!) 97.3 F (36.3 C) (Temporal)   Ht _0  (1.753 m)   Wt 195 lb 12.8 oz (88.8 kg)   SpO2 98%   BMI 28.91 kg/m   Physical Exam Vitals reviewed.  Cardiovascular:     Pulses: Normal pulses.  Musculoskeletal:        General: Swelling and tenderness present.       Feet:  Feet:     Right foot:     Skin integrity: Erythema present. No warmth.     Toenail Condition: Right toenails are long.     Left  foot:     Toenail Condition: Left toenails are long.  Neurological:     Mental Status: He is oriented to person, place, and time.    Assessment & Plan:  This visit occurred during the SARS-CoV-2 public health emergency.  Safety protocols were in place, including screening questions prior to the visit, additional usage of staff PPE, and extensive cleaning of exam room while observing appropriate contact time as indicated for disinfecting solutions.   Zymiere was seen today for acute visit.  Diagnoses and all orders for this visit:  Great toe pain, right -     DG Foot Complete Right -     naproxen (EC NAPROSYN) 500 MG EC tablet; Take 1 tablet (500 mg total) by mouth 2 (two) times daily with a meal. With food  Review of radiology images indicates hypertrophic changes in 1st MTP joint, no obvious foreign object. Treat as possible gouty  arthritis with naproxen while waiting for radiologist to review images again.  Problem List Items Addressed This Visit    None    Visit Diagnoses    Great toe pain, right    -  Primary   Relevant Medications   naproxen (EC NAPROSYN) 500 MG EC tablet   Other Relevant Orders   DG Foot Complete Right      Follow-up: No follow-ups on file.  Wilfred Lacy, NP

## 2020-10-11 NOTE — Patient Instructions (Signed)
Go to lab for foot x-ray.

## 2020-10-11 NOTE — Telephone Encounter (Signed)
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 No obvious foreign object. Waiting for radiologist to confirm. 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

## 2020-10-14 ENCOUNTER — Ambulatory Visit: Payer: Medicare Other | Admitting: Nurse Practitioner

## 2020-10-14 NOTE — Telephone Encounter (Signed)
Pt was notified on 10/11/2020 and verbalized understanding.

## 2021-01-08 ENCOUNTER — Ambulatory Visit: Payer: Medicare Other | Admitting: Orthopaedic Surgery

## 2021-03-20 ENCOUNTER — Ambulatory Visit (INDEPENDENT_AMBULATORY_CARE_PROVIDER_SITE_OTHER): Payer: Medicare Other | Admitting: Family Medicine

## 2021-03-20 ENCOUNTER — Other Ambulatory Visit: Payer: Self-pay

## 2021-03-20 ENCOUNTER — Encounter: Payer: Self-pay | Admitting: Family Medicine

## 2021-03-20 VITALS — BP 120/70 | HR 41 | Temp 97.7°F | Ht 69.0 in | Wt 196.7 lb

## 2021-03-20 DIAGNOSIS — I2510 Atherosclerotic heart disease of native coronary artery without angina pectoris: Secondary | ICD-10-CM

## 2021-03-20 DIAGNOSIS — D229 Melanocytic nevi, unspecified: Secondary | ICD-10-CM | POA: Diagnosis not present

## 2021-03-20 DIAGNOSIS — E785 Hyperlipidemia, unspecified: Secondary | ICD-10-CM

## 2021-03-20 DIAGNOSIS — M21611 Bunion of right foot: Secondary | ICD-10-CM

## 2021-03-20 DIAGNOSIS — N644 Mastodynia: Secondary | ICD-10-CM | POA: Diagnosis not present

## 2021-03-20 NOTE — Progress Notes (Signed)
Richard Harding is a 75 y.o. male  Chief Complaint  Patient presents with  . Acute Visit    Pt c/o pain on his rt nipple,  painful when showering, x 38month.  Also pain on both sides when it gets cold,  pt also would like his rt foot looked at, he has a bone that sticks out.    HPI: Richard Mcgaughis a 74y.o. male who complains of  1. He complains of Rt nipple pain x months, sore to touch and he gives example of when he washes in the shower. He also states it can hurt without any contact. No bleeding or discharge. He notes a new "mole" beneath his Rt nipple - unsure of how long it has been present but only noticed yesterday.  No fever, chills, unexplained weight loss, night sweats.  No fam h/o breast cancer  2. Pt also states he has a "bone that sticks out" on his Rt foot x years. Painful depending on the type of show is wears.   3. He is concerned about his carotid arteries and blood flow, blockage. Pt has hypercholesterolemia. He also feels a pulsing sensation in his head at times, usually in the AM. Not new or worse. No syncope or near syncope.   Past Medical History:  Diagnosis Date  . Arthritis   . Arthritis of right knee 04/25/2020  . Carpal tunnel syndrome, bilateral   . Chickenpox   . [place]   . Hyperlipemia   . Hypertension   . RBBB 04/17/2020   Noted on EKG    Past Surgical History:  Procedure Laterality Date  . BILATERAL CARPAL TUNNEL RELEASE    . CERVICAL FUSION    . COLONOSCOPY  2011  . 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 Marital status: Single    Spouse name: Not on file  . Number of children: Not on file  . Years of education: Not on file  . Highest education level: Not on file  Occupational History  . Not on file   Tobacco Use  . Smoking status: Never Smoker  . Smokeless tobacco: Never Used  Vaping Use  . Vaping Use: Never used  Substance and Sexual Activity  . Alcohol use: No  . Drug use: No  . Sexual activity: Not on file  Other Topics Concern  . Not on file  Social History Narrative  . Not on file   Social Determinants of Health   Financial Resource Strain: Not on file  Food Insecurity: Not on file  Transportation Needs: Not on file  Physical Activity: Not on file  Stress: Not on file  Social Connections: Not on file  Intimate Partner Violence: Not on file    No family history on file.   Immunization History  Administered Date(s) Administered  . Fluad Quad(high Dose 65+) 09/04/2020  . Influenza, High Dose Seasonal PF 09/16/2015, 09/14/2017, 08/31/2019  . Influenza-Unspecified 12/01/2018  . Moderna Sars-Covid-2 Vaccination 02/03/2020, 03/02/2020  . Pneumococcal Conjugate-13 09/16/2015  . Pneumococcal Polysaccharide-23 01/18/2014  . Tdap 01/17/2012  . Zoster 02/21/2014  . Zoster Recombinat (Shingrix) 11/25/2018, 05/28/2019    Outpatient Encounter Medications as of 03/20/2021  Medication Sig  . Ascorbic Acid (VITAMIN C) 1000 MG tablet Take 2,000 mg by mouth at bedtime.   .Marland Kitchen  aspirin EC 81 MG EC tablet Take 1 tablet (81 mg total) by mouth 2 (two) times daily after a meal.  . atorvastatin (LIPITOR) 10 MG tablet Take 1 tablet (10 mg total) by mouth daily.  . blood glucose meter kit and supplies KIT Check blood glucose once daily. E11.9.  Marland Kitchen chlorthalidone (HYGROTON) 25 MG tablet Take 1 tablet (25 mg total) by mouth daily.  . fenofibrate 54 MG tablet Take 1qd (plz schedule virtual visit for January) (Patient taking differently: Take 54 mg by mouth daily.)  . Flaxseed, Linseed, (FLAX SEED OIL PO) Take 1 capsule by mouth daily.   . fluticasone (CUTIVATE) 0.05 % cream Apply topically 2 (two) times daily.  Marland Kitchen GLUCOSA-CHONDR-NA CHONDR-MSM PO Take 2 capsules by mouth at bedtime.   .  metFORMIN (GLUCOPHAGE-XR) 500 MG 24 hr tablet Take 1 tablet (500 mg total) by mouth every evening.  . metoprolol tartrate (LOPRESSOR) 50 MG tablet Take 1 tablet (50 mg total) by mouth 2 (two) times daily. (Patient taking differently: Take 100 mg by mouth daily. Takes evening only)  . naproxen (EC NAPROSYN) 500 MG EC tablet Take 1 tablet (500 mg total) by mouth 2 (two) times daily with a meal. With food  . niacin 500 MG tablet Take 1 tablet (500 mg total) by mouth daily.  Marland Kitchen omega-3 acid ethyl esters (LOVAZA) 1 g capsule Take 1 capsule (1 g total) by mouth 4 (four) times daily.  . potassium bicarbonate (K-LYTE) 25 MEQ disintegrating tablet Take 25 mEq by mouth 2 (two) times daily.  . Red Yeast Rice 600 MG CAPS Take 1 capsule (600 mg total) by mouth daily.   No facility-administered encounter medications on file as of 03/20/2021.     ROS: Pertinent positives and negatives noted in HPI. Remainder of ROS non-contributory  No Known Allergies  There were no vitals taken for this visit.  Wt Readings from Last 3 Encounters:  10/11/20 195 lb 12.8 oz (88.8 kg)  09/04/20 196 lb 3.2 oz (89 kg)  07/08/20 198 lb (89.8 kg)   Temp Readings from Last 3 Encounters:  10/11/20 (!) 97.3 F (36.3 C) (Temporal)  09/04/20 97.9 F (36.6 C) (Temporal)  04/30/20 98.8 F (37.1 C) (Oral)   BP Readings from Last 3 Encounters:  10/11/20 112/66  09/04/20 120/72  04/30/20 125/61   Pulse Readings from Last 3 Encounters:  10/11/20 88  09/04/20 82  04/30/20 90    Physical Exam Constitutional:      General: He is not in acute distress.    Appearance: He is normal weight. He is not ill-appearing.  Neck:     Vascular: Normal carotid pulses. No carotid bruit or JVD.  Chest:  Breasts:     Right: Skin change (possible new dark brown, irregular shaped, rough nevus at the inferior aspect of Rt areola) and tenderness ( nipple with TTP) present. No swelling, bleeding, inverted nipple, mass, nipple discharge,  axillary adenopathy or supraclavicular adenopathy.    Lymphadenopathy:     Upper Body:     Right upper body: No supraclavicular, axillary or pectoral adenopathy.  Neurological:     Mental Status: He is alert and oriented to person, place, and time.  Psychiatric:        Mood and Affect: Mood normal.        Behavior: Behavior normal.      A/P:  1. Nipple pain - present x months, now pt has discomfort without any contact/applied pressure - normal exam other than ?  New nevus (see below #2) - MM Digital Diagnostic Unilat R; Future  2. 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 Hyperlipidemia LDL goal <70 4. Atherosclerotic cardiovascular disease - VAS US CAROTID; Future  5. Bunion, right foot - pt does not want surgery and does not want to see podiatry at this time - f/u PRN    This visit occurred during the SARS-CoV-2 public health emergency.  Safety protocols were in place, including screening questions prior to the visit, additional usage of staff PPE, and extensive cleaning of exam room while observing appropriate contact time as indicated for disinfecting solutions.

## 2021-03-21 ENCOUNTER — Telehealth: Payer: Self-pay

## 2021-03-21 DIAGNOSIS — I10 Essential (primary) hypertension: Secondary | ICD-10-CM

## 2021-03-21 MED ORDER — CHLORTHALIDONE 25 MG PO TABS: 25.0000 mg | ORAL_TABLET | Freq: Every day | ORAL | 3 refills | Status: DC

## 2021-03-21 MED ORDER — METOPROLOL TARTRATE 50 MG PO TABS: 50.0000 mg | ORAL_TABLET | Freq: Two times a day (BID) | ORAL | 3 refills | Status: DC

## 2021-03-21 NOTE — Telephone Encounter (Signed)
Last OV 03/31/2 Last fill for both meds 02/29/20  #90/3

## 2021-03-26 ENCOUNTER — Ambulatory Visit: Payer: Medicare Other | Admitting: Family Medicine

## 2021-03-26 DIAGNOSIS — D485 Neoplasm of uncertain behavior of skin: Secondary | ICD-10-CM | POA: Diagnosis not present

## 2021-03-26 DIAGNOSIS — D229 Melanocytic nevi, unspecified: Secondary | ICD-10-CM | POA: Diagnosis not present

## 2021-03-26 DIAGNOSIS — L814 Other melanin hyperpigmentation: Secondary | ICD-10-CM | POA: Diagnosis not present

## 2021-03-26 DIAGNOSIS — L821 Other seborrheic keratosis: Secondary | ICD-10-CM | POA: Diagnosis not present

## 2021-03-27 ENCOUNTER — Encounter: Payer: Self-pay | Admitting: Family Medicine

## 2021-03-27 ENCOUNTER — Other Ambulatory Visit: Payer: Self-pay | Admitting: Family Medicine

## 2021-03-27 ENCOUNTER — Other Ambulatory Visit: Payer: Self-pay

## 2021-03-27 ENCOUNTER — Ambulatory Visit (INDEPENDENT_AMBULATORY_CARE_PROVIDER_SITE_OTHER): Payer: Medicare Other | Admitting: Family Medicine

## 2021-03-27 VITALS — BP 122/80 | Temp 97.3°F | Ht 69.0 in | Wt 195.6 lb

## 2021-03-27 DIAGNOSIS — N644 Mastodynia: Secondary | ICD-10-CM

## 2021-03-27 DIAGNOSIS — L918 Other hypertrophic disorders of the skin: Secondary | ICD-10-CM | POA: Diagnosis not present

## 2021-03-27 NOTE — Progress Notes (Signed)
Richard Harding is a 75 y.o. male  No chief complaint on file.   HPI: Richard Harding is a 75 y.o. male who complains of a skin tag under his Rt arm that gets irritated and sore at times, likely from arm or shirt sleeve rubbing against it. Present x years, no appreciable change. No personal or fam h/o skin cancer.  Past Medical History:  Diagnosis Date  . Arthritis   . Arthritis of right knee 04/25/2020  . Carpal tunnel syndrome, bilateral   . Chickenpox   . Diabetes mellitus without complication ([place])   . Hyperlipemia   . Hypertension   . RBBB 04/17/2020   Noted on EKG    Past Surgical History:  Procedure Laterality Date  . BILATERAL CARPAL TUNNEL RELEASE    . CERVICAL FUSION    . COLONOSCOPY  2011  . 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 Marital status: Single    Spouse name: Not on file  . Number of children: Not on file  . Years of education: Not on file  . Highest education level: Not on file  Occupational History  . Not on file  Tobacco Use  . Smoking status: Never Smoker  . Smokeless tobacco: Never Used  Vaping Use  . Vaping Use: Never used  Substance and Sexual Activity  . Alcohol use: No  . Drug use: No  . Sexual activity: Not on file  Other Topics Concern  . Not on file  Social History Narrative  . Not on file   Social Determinants of Health   Financial Resource Strain: Not on file  Food Insecurity: Not on file  Transportation Needs: Not on file  Physical Activity: Not on file  Stress: Not on file  Social Connections: Not on file  Intimate Partner Violence: Not on file    History reviewed. No pertinent family history.   Immunization History  Administered Date(s) Administered  . Fluad Quad(high Dose 65+) 09/04/2020  . Influenza, High Dose  Seasonal PF 09/16/2015, 09/14/2017, 08/31/2019  . Influenza-Unspecified 12/01/2018  . Moderna Sars-Covid-2 Vaccination 02/03/2020, 03/02/2020  . Pneumococcal Conjugate-13 09/16/2015  . Pneumococcal Polysaccharide-23 01/18/2014  . Tdap 01/17/2012  . Zoster 02/21/2014  . Zoster Recombinat (Shingrix) 11/25/2018, 05/28/2019    Outpatient Encounter Medications as of 03/27/2021  Medication Sig  . Ascorbic Acid (VITAMIN C) 1000 MG tablet Take 2,000 mg by mouth at bedtime.   Marland Kitchen aspirin EC 81 MG EC tablet Take 1 tablet (81 mg total) by mouth 2 (two) times daily after a meal.  . atorvastatin (LIPITOR) 10 MG tablet Take 1 tablet (10 mg total) by mouth daily.  . blood glucose meter kit and supplies KIT Check blood glucose once daily. E11.9.  Marland Kitchen chlorthalidone (HYGROTON) 25 MG tablet Take 1 tablet (25 mg total) by mouth daily.  . fenofibrate 54 MG tablet Take 1qd (plz schedule virtual visit for January) (Patient taking differently: Take 54 mg by mouth daily.)  . Flaxseed, Linseed, (FLAX SEED OIL PO) Take 1 capsule by mouth daily.   . fluticasone (CUTIVATE) 0.05 % cream Apply topically 2 (two) times daily.  Marland Kitchen GLUCOSA-CHONDR-NA CHONDR-MSM PO Take 2 capsules by mouth at bedtime.   . metFORMIN (GLUCOPHAGE-XR) 500 MG 24 hr tablet Take 1 tablet (500 mg total) by mouth every evening.  Marland Kitchen  metoprolol tartrate (LOPRESSOR) 50 MG tablet Take 1 tablet (50 mg total) by mouth 2 (two) times daily.  . naproxen (EC NAPROSYN) 500 MG EC tablet Take 1 tablet (500 mg total) by mouth 2 (two) times daily with a meal. With food  . niacin 500 MG tablet Take 1 tablet (500 mg total) by mouth daily.  Marland Kitchen omega-3 acid ethyl esters (LOVAZA) 1 g capsule Take 1 capsule (1 g total) by mouth 4 (four) times daily.  . potassium bicarbonate (K-LYTE) 25 MEQ disintegrating tablet Take 25 mEq by mouth 2 (two) times daily.  . Red Yeast Rice 600 MG CAPS Take 1 capsule (600 mg total) by mouth daily.   No facility-administered encounter medications on  file as of 03/27/2021.     ROS: Pertinent positives and negatives noted in HPI. Remainder of ROS non-contributory  No Known Allergies  Temp (!) 97.3 F (36.3 C) (Temporal)   Ht _0  (1.753 m)   Wt 195 lb 9.6 oz (88.7 kg)   BMI 28.89 kg/m   Wt Readings from Last 3 Encounters:  03/27/21 195 lb 9.6 oz (88.7 kg)  03/20/21 196 lb 11.2 oz (89.2 kg)  10/11/20 195 lb 12.8 oz (88.8 kg)   Temp Readings from Last 3 Encounters:  03/27/21 (!) 97.3 F (36.3 C) (Temporal)  03/20/21 97.7 F (36.5 C) (Temporal)  10/11/20 (!) 97.3 F (36.3 C) (Temporal)   BP Readings from Last 3 Encounters:  03/20/21 120/70  10/11/20 112/66  09/04/20 120/72   Pulse Readings from Last 3 Encounters:  03/20/21 (!) 41  10/11/20 88  09/04/20 82     Physical Exam Constitutional:      General: He is not in acute distress.    Appearance: Normal appearance.  Skin:    Comments: Skin tag inferior aspect of Rt axilla  Neurological:     Mental Status: He is alert and oriented to person, place, and time.  Psychiatric:        Behavior: Behavior normal.      After obtaining informed consent, area was prepped with chlorhexidine swab x 2 and allowed to dry. Area was then injected with 2.90m of 1% lidocaine. Forceps and a dermablade were used to remove the skin tag and silver nitrate was applied. A Band-Aid was applied. Pt tolerated the procedure without issue or complication.   A/P:  1. Inflamed skin tag - see procedure note above - removed without issue/complication - discussed local wound care with pt - f/u PRN    This visit occurred during the SARS-CoV-2 public health emergency.  Safety protocols were in place, including screening questions prior to the visit, additional usage of staff PPE, and extensive cleaning of exam room while observing appropriate contact time as indicated for disinfecting solutions.

## 2021-03-27 NOTE — Patient Instructions (Signed)
Mammogram ordered to [facility] [phone]  Carotid ultrasound at [facility]  [address] [place], [place] [address] [phone]

## 2021-04-04 ENCOUNTER — Ambulatory Visit: Payer: Medicare Other

## 2021-04-04 ENCOUNTER — Ambulatory Visit
Admission: RE | Admit: 2021-04-04 | Discharge: 2021-04-04 | Disposition: A | Discharge status: Home / Self Care | Payer: Medicare Other | Source: Ambulatory Visit | Attending: Family Medicine | Admitting: Family Medicine

## 2021-04-04 ENCOUNTER — Other Ambulatory Visit: Payer: Self-pay

## 2021-04-04 DIAGNOSIS — N62 Hypertrophy of breast: Secondary | ICD-10-CM | POA: Diagnosis not present

## 2021-04-04 DIAGNOSIS — N644 Mastodynia: Secondary | ICD-10-CM

## 2021-04-10 ENCOUNTER — Telehealth: Payer: Self-pay | Admitting: Family Medicine

## 2021-04-10 DIAGNOSIS — E119 Type 2 diabetes mellitus without complications: Secondary | ICD-10-CM

## 2021-04-10 DIAGNOSIS — E785 Hyperlipidemia, unspecified: Secondary | ICD-10-CM

## 2021-04-10 MED ORDER — OMEGA-3-ACID ETHYL ESTERS 1 G PO CAPS: 1.0000 g | ORAL_CAPSULE | Freq: Four times a day (QID) | ORAL | 3 refills | Status: DC

## 2021-04-10 MED ORDER — METFORMIN HCL ER 500 MG PO TB24: 500.0000 mg | ORAL_TABLET | Freq: Every evening | ORAL | 3 refills | Status: DC

## 2021-04-10 NOTE — Telephone Encounter (Signed)
Patient is requesting refills on Metformin and Omega-3. If approved, please send to [facility] and call him at [phone] to let him know that it has been sent in.

## 2021-04-10 NOTE — Telephone Encounter (Signed)
Last OV 03/27/21 Last fill for both meds 02/28/21  #360/3 #90/3

## 2021-04-10 NOTE — Telephone Encounter (Signed)
Medication sent to pharmacy, pt aware 

## 2021-04-16 ENCOUNTER — Other Ambulatory Visit: Payer: Self-pay | Admitting: Family Medicine

## 2021-04-16 DIAGNOSIS — I6523 Occlusion and stenosis of bilateral carotid arteries: Secondary | ICD-10-CM

## 2021-04-16 DIAGNOSIS — I2510 Atherosclerotic heart disease of native coronary artery without angina pectoris: Secondary | ICD-10-CM

## 2021-04-21 ENCOUNTER — Other Ambulatory Visit: Payer: Self-pay

## 2021-04-21 ENCOUNTER — Ambulatory Visit (HOSPITAL_BASED_OUTPATIENT_CLINIC_OR_DEPARTMENT_OTHER)
Admission: RE | Admit: 2021-04-21 | Discharge: 2021-04-21 | Disposition: A | Discharge status: Home / Self Care | Payer: Medicare Other | Source: Ambulatory Visit | Attending: Family Medicine | Admitting: Family Medicine

## 2021-04-21 DIAGNOSIS — I2510 Atherosclerotic heart disease of native coronary artery without angina pectoris: Secondary | ICD-10-CM | POA: Diagnosis not present

## 2021-04-21 DIAGNOSIS — I6523 Occlusion and stenosis of bilateral carotid arteries: Secondary | ICD-10-CM | POA: Diagnosis not present

## 2021-05-02 ENCOUNTER — Encounter (HOSPITAL_BASED_OUTPATIENT_CLINIC_OR_DEPARTMENT_OTHER): Payer: Medicare Other

## 2021-05-23 ENCOUNTER — Telehealth: Payer: Self-pay | Admitting: Family Medicine

## 2021-05-23 NOTE — Telephone Encounter (Signed)
I spoke to patient.  Patient will check w/ insurance company to make sure AWV will be covered. His insurannce is sending a NP to his house and offered him $50.  Patient will call back and schedule, if  Medicare Annual Wellness Visit (AWV) is covered.   Please offer to do virtually or by telephone.   Due for AWVI  Please schedule at anytime with Nurse Health Advisor.

## 2021-05-26 LAB — COLOGUARD: Cologuard: NEGATIVE

## 2021-06-02 ENCOUNTER — Telehealth: Payer: Self-pay | Admitting: Family Medicine

## 2021-06-02 DIAGNOSIS — E785 Hyperlipidemia, unspecified: Secondary | ICD-10-CM

## 2021-06-02 NOTE — Telephone Encounter (Signed)
Patient is requesting a refill on his Fenofibrate 54mg . He would like a one year supply (90 day intervals). Please call him if you have any questions.

## 2021-06-03 MED ORDER — FENOFIBRATE 54 MG PO TABS: 54.0000 mg | ORAL_TABLET | Freq: Every day | ORAL | 3 refills | Status: DC

## 2021-06-03 NOTE — Telephone Encounter (Signed)
Last OV 03/24/21 Last fill 02/29/20  #90/3

## 2021-06-03 NOTE — Telephone Encounter (Signed)
Rx sent to pharmacy   

## 2021-08-30 ENCOUNTER — Telehealth: Payer: Self-pay

## 2021-08-30 NOTE — Telephone Encounter (Signed)
LVM for patient to call office and schedule AWV.

## 2021-09-10 ENCOUNTER — Telehealth: Payer: Self-pay | Admitting: Family Medicine

## 2021-09-10 NOTE — Telephone Encounter (Signed)
Pt stopped by and asked if we could refill his Atorvastatin, he asked for a year supply if able. I did let him know he needs to schedule a toc and he said hes going to look online and see who he wants to see. Please advise

## 2021-09-11 ENCOUNTER — Other Ambulatory Visit: Payer: Self-pay | Admitting: Family

## 2021-09-11 DIAGNOSIS — E785 Hyperlipidemia, unspecified: Secondary | ICD-10-CM

## 2021-09-11 MED ORDER — ATORVASTATIN CALCIUM 10 MG PO TABS: 10.0000 mg | ORAL_TABLET | Freq: Every day | ORAL | 3 refills | Status: DC

## 2022-03-26 ENCOUNTER — Encounter: Payer: Self-pay | Admitting: Nurse Practitioner

## 2022-03-26 ENCOUNTER — Ambulatory Visit (INDEPENDENT_AMBULATORY_CARE_PROVIDER_SITE_OTHER): Payer: Medicare Other | Admitting: Nurse Practitioner

## 2022-03-26 VITALS — BP 150/78 | HR 79 | Temp 97.3°F | Ht 66.0 in | Wt 194.6 lb

## 2022-03-26 DIAGNOSIS — I10 Essential (primary) hypertension: Secondary | ICD-10-CM | POA: Diagnosis not present

## 2022-03-26 DIAGNOSIS — R5383 Other fatigue: Secondary | ICD-10-CM | POA: Diagnosis not present

## 2022-03-26 DIAGNOSIS — M65962 Unspecified synovitis and tenosynovitis, left lower leg: Secondary | ICD-10-CM | POA: Insufficient documentation

## 2022-03-26 DIAGNOSIS — M1712 Unilateral primary osteoarthritis, left knee: Secondary | ICD-10-CM

## 2022-03-26 DIAGNOSIS — R21 Rash and other nonspecific skin eruption: Secondary | ICD-10-CM | POA: Insufficient documentation

## 2022-03-26 DIAGNOSIS — N6489 Other specified disorders of breast: Secondary | ICD-10-CM

## 2022-03-26 DIAGNOSIS — E1151 Type 2 diabetes mellitus with diabetic peripheral angiopathy without gangrene: Secondary | ICD-10-CM | POA: Diagnosis not present

## 2022-03-26 DIAGNOSIS — E785 Hyperlipidemia, unspecified: Secondary | ICD-10-CM

## 2022-03-26 HISTORY — DX: Rash and other nonspecific skin eruption: R21

## 2022-03-26 HISTORY — DX: Unspecified synovitis and tenosynovitis, left lower leg: M65.962

## 2022-03-26 LAB — GLUCOSE, POCT (MANUAL RESULT ENTRY): POC Glucose: 79 mg/dl (ref 70–99)

## 2022-03-26 LAB — POCT GLYCOSYLATED HEMOGLOBIN (HGB A1C)
HbA1c POC (<> result, manual entry): 7.1 % (ref 4.0–5.6)
HbA1c, POC (controlled diabetic range): 7.1 % — AB (ref 0.0–7.0)
HbA1c, POC (prediabetic range): 7.1 % — AB (ref 5.7–6.4)
Hemoglobin A1C: 7.1 % — AB (ref 4.0–5.6)

## 2022-03-26 MED ORDER — CLOTRIMAZOLE-BETAMETHASONE 1-0.05 % EX CREA: 1.0000 "application " | TOPICAL_CREAM | Freq: Every day | CUTANEOUS | 2 refills | Status: DC

## 2022-03-26 MED ORDER — DEXCOM G6 SENSOR MISC: 3.0000 | 1 refills | Status: DC

## 2022-03-26 MED ORDER — CHLORTHALIDONE 25 MG PO TABS: 25.0000 mg | ORAL_TABLET | Freq: Every day | ORAL | 3 refills | Status: DC

## 2022-03-26 MED ORDER — DEXCOM G6 RECEIVER DEVI: 1.0000 | Freq: Every day | 0 refills | Status: DC

## 2022-03-26 MED ORDER — METFORMIN HCL ER 500 MG PO TB24: 500.0000 mg | ORAL_TABLET | Freq: Every evening | ORAL | 3 refills | Status: DC

## 2022-03-26 MED ORDER — OMEGA-3-ACID ETHYL ESTERS 1 G PO CAPS: 1.0000 g | ORAL_CAPSULE | Freq: Four times a day (QID) | ORAL | 3 refills | Status: DC

## 2022-03-26 MED ORDER — ATORVASTATIN CALCIUM 10 MG PO TABS: 10.0000 mg | ORAL_TABLET | Freq: Every day | ORAL | 3 refills | Status: DC

## 2022-03-26 MED ORDER — FENOFIBRATE 54 MG PO TABS: 54.0000 mg | ORAL_TABLET | Freq: Every day | ORAL | 3 refills | Status: DC

## 2022-03-26 MED ORDER — METOPROLOL TARTRATE 50 MG PO TABS: 50.0000 mg | ORAL_TABLET | Freq: Two times a day (BID) | ORAL | 3 refills | Status: DC

## 2022-03-26 NOTE — Assessment & Plan Note (Signed)
Ongoing rash to his arms, back, left thigh, and areolas.  He had a mammogram done which was normal and showed slight gynecomastia.  We will send in Lotrisone cream daily for rash.  Referral placed to dermatology.  Encouraged him to stop cinnamon supplement as he is allergic to cinnamon gum.  Follow-up in 2 months or sooner with concerns ?

## 2022-03-26 NOTE — Assessment & Plan Note (Signed)
A1c today is 7.1%, his diabetes is well controlled.  Continue metformin 500 mg daily.  He is interested to see if his insurance will cover a continuous glucose monitor, will order this today.  Discussed yearly eye exams, will check foot exam next visit.  Check urine microalbumin today.  He is not on an ACE or ARB, will consider adding this next visit especially with elevated blood pressure.  Follow-up in 2 months. ?

## 2022-03-26 NOTE — Assessment & Plan Note (Addendum)
Chronic, blood pressure slightly elevated today at 150/78.  He is taking multiple supplements and unsure if this is having an traction with any of his medications for blood pressure.  We will place a referral to [facility] pharmacy to help evaluate this.  Encouraged him to check his blood pressure at home.  A list of the supplements he is taking is: ? ?Aspirin '81mg'$  daily ?Multivitamin 1 tablet daily ?Potassium '99mg'$  daily ?Cinnamon '500mg'$  daily ?Glucosamine Chondroitin 150 daily ?Glucosamine HCL 1,500 mg daily ?Vitamin B complex ?Red yeast rice 1,'200mg'$  daily ?Ginger Root '550mg'$  daily ?Garlic 1,'000mg'$  daily ?Ginkgo Biloba '60mg'$  daily ?Ginseng '100mg'$  daily ?Ashwagandha '500mg'$  daily ?St. John's Wort '300mg'$  daily ?Maca '500mg'$  daily ?Saw Palmetto '450mg'$  daily ? ?We did discuss that ginger, garlic, ginkgo, and ginseng can increase risk of bleeding. He should monitor for any signs of bleeding. He states that he is allergic to cinnamon gum, so I recommend he stop the cinnamon supplement.  Continue chlorthalidone 25 mg daily, metoprolol 50 mg twice daily.  Refill sent to the pharmacy.  Check CMP, CBC. ? ?

## 2022-03-26 NOTE — Assessment & Plan Note (Signed)
We will check lipid panel today and adjust regimen based on results.  For now continue omega-3, atorvastatin 10 mg daily, fenofibrate 54 mg daily. ?

## 2022-03-26 NOTE — Patient Instructions (Addendum)
It was great to see you! ? ?Start lotrisone cream daily to your rash. We are checking labs and will let you know the results. I am placing a referal to dermatology and orthopedics.  ? ?I sent refills on your medicines to your pharmacy. Check your blood pressure at home if you are able.  ? ?Let's follow-up in 2 months, sooner if you have concerns. ? ?If a referral was placed today, you will be contacted for an appointment. Please note that routine referrals can sometimes take up to 3-4 weeks to process. Please call our office if you haven't heard anything after this time frame. ? ?Take care, ? ?Vance Peper, NP ? ?

## 2022-03-26 NOTE — Assessment & Plan Note (Signed)
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  Will place referral to orthopedic surgery.  He was told he would most likely need a replacement in the future. ?

## 2022-03-26 NOTE — Progress Notes (Signed)
? ?Established Patient Office Visit ? ?Subjective:  ?Patient ID: Richard Harding, male    DOB: 08-Jan-1946  Age: 76 y.o. MRN: [id number] ? ?CC:  ?Chief Complaint  ?Patient presents with  ? Establish Care  ?  Np. Est care. Pt c/o skin rash for several days  ? ? ?HPI ?Richard Harding presents to transfer care to a new provider.  Introduced to Designer, jewellery role and practice setting.  All questions answered.  Discussed provider/patient relationship and expectations. ? ?He has a skin rash for the past several years. He is having itching on his nipples, arm, and back. He had a mammogram last year and was negative for cancer.  However since then, his areolas have been red, scaly, itching.  He would like a referral to dermatology and is also seeing if an MRI would be covered.  His rash was treated with fluticasone propionate cream 0.05% which helped his symptoms.  However the rash is ongoing, even with using the cream. ? ?He endorses fatigue. He does not feel like doing things he normally does like gardening.  He has a history of low testosterone and would like to have this checked again.  He is interested in testosterone replacement if it is low. ? ?He has a history of diabetes and high blood pressure.  He checks his blood sugars at home [facility] however he is wondering if the continuous glucose monitor will be covered as he does not like to stick his fingers.  He has not been checking his blood pressure at home.  He denies blurred vision, dysuria, urinary frequency, chest pain, shortness of breath, neuropathy. ? ?He also has a history of left knee pain.  He had his right knee replaced by Richard Harding in 2021, and was told that he would eventually need his left knee replaced as well.  He is interested in referral back to orthopedics to consider surgery this fall. ? ?Past Medical History:  ?Diagnosis Date  ? Arthritis   ? Arthritis of right knee 04/25/2020  ? Carpal tunnel syndrome, bilateral   ? Chickenpox   ?  Diabetes mellitus without complication ([place])   ? Hyperlipemia   ? Hypertension   ? RBBB 04/17/2020  ? Noted on EKG  ? ? ?Past Surgical History:  ?Procedure Laterality Date  ? BILATERAL CARPAL TUNNEL RELEASE    ? CERVICAL FUSION    ? COLONOSCOPY  2011  ? 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 Procedure: RIGHT TOTAL KNEE ARTHROPLASTY;  Surgeon: Richard Rossetti, MD;  Location: [facility];  Service: Orthopedics;  Laterality: Right;  ? ? ?History reviewed. No pertinent family history. ? ?Social History  ? ?Socioeconomic History  ? Marital status: Single  ?  Spouse name: Not on file  ? Number of children: Not on file  ? Years of education: Not on file  ? Highest education level: Not on file  ?Occupational History  ? Not on file  ?Tobacco Use  ? Smoking status: Never  ? Smokeless tobacco: Never  ?Vaping Use  ? Vaping Use: Never used  ?Substance and Sexual Activity  ? Alcohol use: No  ? Drug use: No  ? Sexual activity: Not on file  ?Other Topics Concern  ? Not on file  ?Social History Narrative  ? Not on file  ? ?Social Determinants of Health  ? ?Financial Resource Strain: Not on file  ?Food Insecurity: Not on file  ?Transportation Needs: Not on file  ?Physical Activity: Not on file  ?  Stress: Not on file  ?Social Connections: Not on file  ?Intimate Partner Violence: Not on file  ? ? ?Outpatient Medications Prior to Visit  ?Medication Sig Dispense Refill  ? Ascorbic Acid (VITAMIN C) 1000 MG tablet Take 2,000 mg by mouth at bedtime.     ? aspirin EC 81 MG EC tablet Take 1 tablet (81 mg total) by mouth 2 (two) times daily after a meal. 30 tablet 0  ? Flaxseed, Linseed, (FLAX SEED OIL PO) Take 1 capsule by mouth daily.     ? atorvastatin (LIPITOR) 10 MG tablet Take 1 tablet (10 mg total) by mouth daily. 90 tablet 3  ? chlorthalidone (HYGROTON) 25 MG tablet Take 1 tablet (25 mg total) by mouth daily. 90 tablet 3  ? fenofibrate 54 MG tablet Take 1 tablet (54 mg total) by mouth daily. 90 tablet 3  ? metFORMIN  (GLUCOPHAGE-XR) 500 MG 24 hr tablet Take 1 tablet (500 mg total) by mouth every evening. 90 tablet 3  ? metoprolol tartrate (LOPRESSOR) 50 MG tablet Take 1 tablet (50 mg total) by mouth 2 (two) times daily. 180 tablet 3  ? omega-3 acid ethyl esters (LOVAZA) 1 g capsule Take 1 capsule (1 g total) by mouth 4 (four) times daily. 360 capsule 3  ? blood glucose meter kit and supplies KIT Check blood glucose once daily. E11.9. 1 each 0  ? fluticasone (CUTIVATE) 0.05 % cream Apply topically 2 (two) times daily. 60 g 2  ? GLUCOSA-CHONDR-NA CHONDR-MSM PO Take 2 capsules by mouth at bedtime.     ? niacin 500 MG tablet Take 1 tablet (500 mg total) by mouth daily. 90 tablet 3  ? potassium bicarbonate (K-LYTE) 25 MEQ disintegrating tablet Take 25 mEq by mouth 2 (two) times daily.    ? Red Yeast Rice 600 MG CAPS Take 1 capsule (600 mg total) by mouth daily.    ? naproxen (EC NAPROSYN) 500 MG EC tablet Take 1 tablet (500 mg total) by mouth 2 (two) times daily with a meal. With food 14 tablet 0  ? ?No facility-administered medications prior to visit.  ? ? ?No Known Allergies ? ?ROS ?Review of Systems  ?Constitutional:  Positive for fatigue.  ?HENT: Negative.    ?Eyes: Negative.   ?Respiratory: Negative.    ?Cardiovascular: Negative.   ?Gastrointestinal: Negative.   ?Genitourinary: Negative.   ?Musculoskeletal:  Positive for arthralgias (Left knee pain).  ?Skin:  Positive for rash.  ?Neurological: Negative.   ?Psychiatric/Behavioral: Negative.    ? ?  ?Objective:  ?  ?Physical Exam ?Vitals and nursing note reviewed.  ?Constitutional:   ?   General: He is not in acute distress. ?   Appearance: Normal appearance.  ?HENT:  ?   Head: Normocephalic and atraumatic.  ?   Right Ear: Tympanic membrane, ear canal and external ear normal.  ?   Left Ear: Tympanic membrane, ear canal and external ear normal.  ?Eyes:  ?   Conjunctiva/sclera: Conjunctivae normal.  ?Neck:  ?   Vascular: No carotid bruit.  ?Cardiovascular:  ?   Rate and Rhythm:  Normal rate and regular rhythm.  ?   Pulses: Normal pulses.  ?   Heart sounds: Normal heart sounds.  ?Pulmonary:  ?   Effort: Pulmonary effort is normal.  ?   Breath sounds: Normal breath sounds.  ?Abdominal:  ?   Palpations: Abdomen is soft.  ?   Tenderness: There is no abdominal tenderness.  ?Musculoskeletal:     ?  General: Normal range of motion.  ?   Cervical back: Normal range of motion and neck supple. No tenderness.  ?   Right lower leg: No edema.  ?   Left lower leg: No edema.  ?Lymphadenopathy:  ?   Cervical: No cervical adenopathy.  ?Skin: ?   General: Skin is warm and dry.  ?   Findings: Rash present.  ?   Comments: Red raised rash to right antecubital fossa, left antecubital fossa, back, left thigh.  Redness and flaky skin to areolas bilaterally  ?Neurological:  ?   General: No focal deficit present.  ?   Mental Status: He is alert and oriented to person, place, and time.  ?Psychiatric:     ?   Mood and Affect: Mood normal.     ?   Behavior: Behavior normal.     ?   Thought Content: Thought content normal.     ?   Judgment: Judgment normal.  ? ? ?BP (!) 150/78 (BP Location: Right Arm, Cuff Size: Normal)   Pulse 79   Temp (!) 97.3 ?F (36.3 ?C) (Temporal)   Ht 5' 6"  (1.676 m)   Wt 194 lb 9.6 oz (88.3 kg)   SpO2 98%   BMI 31.41 kg/m?  ?Wt Readings from Last 3 Encounters:  ?03/26/22 194 lb 9.6 oz (88.3 kg)  ?03/27/21 195 lb 9.6 oz (88.7 kg)  ?03/20/21 196 lb 11.2 oz (89.2 kg)  ? ? ? ?Health Maintenance Due  ?Topic Date Due  ? FOOT EXAM  09/14/2018  ? COLONOSCOPY (Pts 45-35yr Insurance coverage will need to be confirmed)  01/17/2020  ? COVID-19 Vaccine (3 - Moderna risk series) 03/30/2020  ? OPHTHALMOLOGY EXAM  12/19/2020  ? URINE MICROALBUMIN  02/28/2021  ? TETANUS/TDAP  01/16/2022  ? ? ?There are no preventive care reminders to display for this patient. ? ?No results found for: TSH ?Lab Results  ?Component Value Date  ? WBC 10.8 (H) 04/28/2020  ? HGB 11.8 (L) 04/28/2020  ? HCT 34.0 (L) 04/28/2020   ? MCV 88.3 04/28/2020  ? PLT 256 04/28/2020  ? ?Lab Results  ?Component Value Date  ? NA 138 09/04/2020  ? K 4.3 09/04/2020  ? CO2 33 (H) 09/04/2020  ? GLUCOSE 104 (H) 09/04/2020  ? BUN 14 09/04/2020  ? CREATININE 1.04 09/05/19

## 2022-03-27 LAB — TSH: TSH: 5.39 mIU/L — ABNORMAL HIGH (ref 0.40–4.50)

## 2022-03-30 ENCOUNTER — Other Ambulatory Visit: Payer: Self-pay | Admitting: Nurse Practitioner

## 2022-03-30 ENCOUNTER — Other Ambulatory Visit (INDEPENDENT_AMBULATORY_CARE_PROVIDER_SITE_OTHER): Payer: Medicare Other

## 2022-03-30 DIAGNOSIS — R5383 Other fatigue: Secondary | ICD-10-CM | POA: Diagnosis not present

## 2022-03-30 DIAGNOSIS — E1151 Type 2 diabetes mellitus with diabetic peripheral angiopathy without gangrene: Secondary | ICD-10-CM | POA: Diagnosis not present

## 2022-03-30 LAB — GLUCOSE, POCT (MANUAL RESULT ENTRY): POC Glucose: 137 mg/dl — AB (ref 70–99)

## 2022-03-30 LAB — CBC WITH DIFFERENTIAL/PLATELET
Absolute Monocytes: 540 cells/uL (ref 200–950)
Basophils Absolute: 38 cells/uL (ref 0–200)
Basophils Relative: 0.5 %
Eosinophils Absolute: 281 cells/uL (ref 15–500)
Eosinophils Relative: 3.7 %
HCT: 41.1 % (ref 38.5–50.0)
Hemoglobin: 14 g/dL (ref 13.2–17.1)
Lymphs Abs: 2440 cells/uL (ref 850–3900)
MCH: 28.9 pg (ref 27.0–33.0)
MCHC: 34.1 g/dL (ref 32.0–36.0)
MCV: 84.9 fL (ref 80.0–100.0)
MPV: 11.3 fL (ref 7.5–12.5)
Monocytes Relative: 7.1 %
Neutro Abs: 4302 cells/uL (ref 1500–7800)
Neutrophils Relative %: 56.6 %
Platelets: 269 10*3/uL (ref 140–400)
RBC: 4.84 10*6/uL (ref 4.20–5.80)
RDW: 12.4 % (ref 11.0–15.0)
Total Lymphocyte: 32.1 %
WBC: 7.6 10*3/uL (ref 3.8–10.8)

## 2022-03-30 LAB — MICROALBUMIN / CREATININE URINE RATIO

## 2022-03-30 LAB — COMPREHENSIVE METABOLIC PANEL
AG Ratio: 1.7 (calc) (ref 1.0–2.5)
ALT: 17 U/L (ref 9–46)
AST: 14 U/L (ref 10–35)
Albumin: 4.5 g/dL (ref 3.6–5.1)
Alkaline phosphatase (APISO): 37 U/L (ref 35–144)
BUN: 18 mg/dL (ref 7–25)
CO2: 27 mmol/L (ref 20–32)
Calcium: 9.9 mg/dL (ref 8.6–10.3)
Chloride: 101 mmol/L (ref 98–110)
Creat: 0.96 mg/dL (ref 0.70–1.28)
Globulin: 2.7 g/dL (calc) (ref 1.9–3.7)
Glucose, Bld: 99 mg/dL (ref 65–99)
Potassium: 3.8 mmol/L (ref 3.5–5.3)
Sodium: 139 mmol/L (ref 135–146)
Total Bilirubin: 0.5 mg/dL (ref 0.2–1.2)
Total Protein: 7.2 g/dL (ref 6.1–8.1)

## 2022-03-30 LAB — LIPID PANEL
Cholesterol: 138 mg/dL (ref <200)
HDL: 36 mg/dL — ABNORMAL LOW (ref >=40)
LDL Cholesterol (Calc): 76 mg/dL (calc)
Non-HDL Cholesterol (Calc): 102 mg/dL (calc) (ref <130)
Total CHOL/HDL Ratio: 3.8 (calc) (ref <5.0)
Triglycerides: 165 mg/dL — ABNORMAL HIGH (ref <150)

## 2022-03-30 LAB — HEMOGLOBIN A1C
Hgb A1c MFr Bld: 6.7 % of total Hgb — ABNORMAL HIGH (ref <5.7)
Mean Plasma Glucose: 146 mg/dL
eAG (mmol/L): 8.1 mmol/L

## 2022-03-30 NOTE — Progress Notes (Signed)
Per the orders of  Vance Peper NP pt is here for labs, pt tolerated draw well. Pt requested re-check of glucose POCT was done. Glucose today was 137. Pt's provider notified. ? ?

## 2022-03-30 NOTE — Progress Notes (Signed)
Patient requesting to have blood glucose checked while here. Order placed.  ?

## 2022-03-31 ENCOUNTER — Telehealth: Payer: Self-pay | Admitting: *Deleted

## 2022-03-31 NOTE — Chronic Care Management (AMB) (Signed)
?  Care Management  ? ?Note ? ?03/31/2022 ?Name: Richard Harding MRN: [id number] DOB: 08/06/1946 ? ?Noboru Aaban Griep is a 76 y.o. year old male who is a primary care patient of McElwee, Lauren A, NP. I reached out to [place] by phone today offer care coordination services.  ? ?Mr. Tolsma was given information about care management services today including:  ?Care management services include personalized support from designated clinical staff supervised by his physician, including individualized plan of care and coordination with other care providers ?24/7 contact phone numbers for assistance for urgent and routine care needs. ?The patient may stop care management services at any time by phone call to the office staff. ? ?Patient agreed to services and verbal consent obtained.  ? ?Follow up plan: ?Telephone appointment with care management team member scheduled for: 04/02/2022 ? ?Kayveon Lennartz, CCMA ?Care Guide, Embedded Care Coordination ?[facility]  Care Management  ?Direct Dial: [phone] ? ? ?

## 2022-04-01 ENCOUNTER — Ambulatory Visit: Payer: Medicare Other

## 2022-04-01 ENCOUNTER — Ambulatory Visit
Admission: RE | Admit: 2022-04-01 | Discharge: 2022-04-01 | Disposition: A | Discharge status: Home / Self Care | Payer: Medicare Other | Source: Ambulatory Visit | Attending: Nurse Practitioner | Admitting: Nurse Practitioner

## 2022-04-01 DIAGNOSIS — N6489 Other specified disorders of breast: Secondary | ICD-10-CM

## 2022-04-01 DIAGNOSIS — R21 Rash and other nonspecific skin eruption: Secondary | ICD-10-CM

## 2022-04-01 DIAGNOSIS — N644 Mastodynia: Secondary | ICD-10-CM | POA: Diagnosis not present

## 2022-04-01 MED ORDER — GADOBUTROL 1 MMOL/ML IV SOLN
9.0000 mL | Freq: Once | INTRAVENOUS | Status: AC | PRN
  Administered 2022-04-01: 9 mL via INTRAVENOUS

## 2022-04-02 ENCOUNTER — Telehealth: Payer: Self-pay

## 2022-04-02 ENCOUNTER — Ambulatory Visit (INDEPENDENT_AMBULATORY_CARE_PROVIDER_SITE_OTHER): Payer: Medicare Other

## 2022-04-02 DIAGNOSIS — E669 Obesity, unspecified: Secondary | ICD-10-CM

## 2022-04-02 DIAGNOSIS — E876 Hypokalemia: Secondary | ICD-10-CM

## 2022-04-02 DIAGNOSIS — E785 Hyperlipidemia, unspecified: Secondary | ICD-10-CM

## 2022-04-02 DIAGNOSIS — M1711 Unilateral primary osteoarthritis, right knee: Secondary | ICD-10-CM

## 2022-04-02 DIAGNOSIS — I10 Essential (primary) hypertension: Secondary | ICD-10-CM

## 2022-04-02 DIAGNOSIS — E1151 Type 2 diabetes mellitus with diabetic peripheral angiopathy without gangrene: Secondary | ICD-10-CM

## 2022-04-02 LAB — TESTOSTERONE, FREE & TOTAL
Free Testosterone: 48.7 pg/mL (ref 30.0–135.0)
Testosterone, Total, LC-MS-MS: 350 ng/dL (ref 250–1100)

## 2022-04-02 NOTE — Progress Notes (Signed)
? ?Chronic Care Management ?Pharmacy Note ? ?04/17/2022 ?Name:  Richard Harding MRN:  [id number] DOB:  1946/02/09 ? ?Summary: ?Patient presents for initial CCM consult.  ? ?Recommendations/Changes made from today's visit: ?-STOP Red Yeast Rice (risk of myalgias)  ?-STOP St. John's Wort (DDI with atorvastatin, many other potential interactions)  ?-STOP Glucosamine HCL 1,500 mg  2 tablets daily  ?-STOP Flax Seed ? ?Plan: ?CPP follow-up 1 month ? ?Subjective: ?Richard Harding is an 76 y.o. year old male who is Harding primary patient of McElwee, Lauren A, NP.  The CCM team was consulted for assistance with disease management and care coordination needs.   ? ?Engaged with patient by telephone for initial visit in response to provider referral for pharmacy case management and/or care coordination services.  ? ?Consent to Services:  ?The patient was given the following information about Chronic Care Management services today, agreed to services, and gave verbal consent: 1. CCM service includes personalized support from designated clinical staff supervised by the primary care provider, including individualized plan of care and coordination with other care providers 2. 24/7 contact phone numbers for assistance for urgent and routine care needs. 3. Service will only be billed when office clinical staff spend 20 minutes or more in Harding month to coordinate care. 4. Only one practitioner may furnish and bill the service in Harding calendar month. 5.The patient may stop CCM services at any time (effective at the end of the month) by phone call to the office staff. 6. The patient will be responsible for cost sharing (co-pay) of up to 20% of the service fee (after annual deductible is met). Patient agreed to services and consent obtained. ? ?Patient Care Team: ?Charyl Dancer, NP as PCP - General (Internal Medicine) ?Clent Jacks, MD as Consulting Physician (Ophthalmology) ?Germaine Pomfret, [facility] (Pharmacist) ? ?Recent office  visits: ?03/26/2022 Vance Peper NP (PCP) stop the cinnamon supplement, start Lotrisone Cream 1-0.05%,  Follow-up in 2 months, Ambulatory referral to Orthopedic Surgery, Ambulatory referral to Dermatology, AMB Referral to [facility] ? ?Recent consult visits: ?No recent consult visit ? ?Hospital visits: ?None in previous 6 months ? ? ?Objective: ? ?Lab Results  ?Component Value Date  ? CREATININE 0.96 03/26/2022  ? BUN 18 03/26/2022  ? GFR 69.96 09/04/2020  ? GFRNONAA >60 04/28/2020  ? GFRAA >60 04/28/2020  ? NA 139 03/26/2022  ? K 3.8 03/26/2022  ? CALCIUM 9.9 03/26/2022  ? CO2 27 03/26/2022  ? GLUCOSE 99 03/26/2022  ? ? ?Lab Results  ?Component Value Date/Time  ? HGBA1C 7.1 ([place]) 03/26/2022 05:18 PM  ? HGBA1C 7.1 03/26/2022 05:18 PM  ? HGBA1C 7.1 (Harding) 03/26/2022 05:18 PM  ? HGBA1C 7.1 (Harding) 03/26/2022 05:18 PM  ? HGBA1C 6.7 (H) 03/26/2022 02:30 PM  ? HGBA1C 6.8 (H) 09/04/2020 03:15 PM  ? HGBA1C 6.2 01/12/2019 11:55 AM  ? GFR 69.96 09/04/2020 03:15 PM  ? GFR 82.78 02/29/2020 01:56 PM  ? MICROALBUR 1.3 02/29/2020 01:56 PM  ? MICROALBUR 1.0 01/12/2019 12:08 PM  ?  ?Last diabetic Eye exam:  ?Lab Results  ?Component Value Date/Time  ? HMDIABEYEEXA No Retinopathy 12/20/2019 12:00 AM  ?  ?Last diabetic Foot exam: No results found for: HMDIABFOOTEX  ? ?Lab Results  ?Component Value Date  ? CHOL 138 03/26/2022  ? HDL 36 (L) 03/26/2022  ? [place] 76 03/26/2022  ? LDLDIRECT 116.0 09/04/2020  ? TRIG 165 (H) 03/26/2022  ? CHOLHDL 3.8 03/26/2022  ? ? ? ?  Latest  Ref Rng & Units 03/26/2022  ?  2:30 PM 09/04/2020  ?  3:15 PM 02/29/2020  ?  1:56 PM  ?Hepatic Function  ?Total Protein 6.1 - 8.1 g/dL 7.2   7.6     ?Albumin 3.5 - 5.2 g/dL  4.5     ?AST 10 - 35 U/L 14   18   23     ?ALT 9 - 46 U/L 17   16   30     ?Alk Phosphatase 39 - 117 U/L  38     ?Total Bilirubin 0.2 - 1.2 mg/dL 0.5   0.5     ? ? ?Lab Results  ?Component Value Date/Time  ? TSH 5.39 (H) 03/26/2022 03:00 PM  ? ? ? ?  Latest Ref Rng & Units 03/26/2022  ?  2:30 PM  04/28/2020  ?  7:45 PM 04/27/2020  ?  3:23 AM  ?CBC  ?WBC 3.8 - 10.8 Thousand/uL 7.6   10.8   12.9    ?Hemoglobin 13.2 - 17.1 g/dL 14.0   11.8   12.1    ?Hematocrit 38.5 - 50.0 % 41.1   34.0   35.6    ?Platelets 140 - 400 Thousand/uL 269   256   281    ? ? ?No results found for: VD25OH ? ?Clinical ASCVD: No  ?The 10-year ASCVD risk score (Arnett DK, et al., 2019) is: 54.3% ?  Values used to calculate the score: ?    Age: 76 years ?    Sex: Male ?    Is Non-Hispanic African American: No ?    Diabetic: Yes ?    Tobacco smoker: No ?    Systolic Blood Pressure: 478 mmHg ?    Is BP treated: Yes ?    HDL Cholesterol: 36 mg/dL ?    Total Cholesterol: 138 mg/dL   ? ? ?  04/07/2022  ?  5:02 PM 09/04/2020  ?  3:12 PM 01/12/2019  ? 11:48 AM  ?Depression screen PHQ 2/9  ?Decreased Interest 0 0 0  ?Down, Depressed, Hopeless 0 0 0  ?PHQ - 2 Score 0 0 0  ?  ? ?Social History  ? ?Tobacco Use  ?Smoking Status Never  ?Smokeless Tobacco Never  ? ?BP Readings from Last 3 Encounters:  ?03/26/22 (!) 150/78  ?03/27/21 122/80  ?03/20/21 120/70  ? ?Pulse Readings from Last 3 Encounters:  ?03/26/22 79  ?03/20/21 (!) 41  ?10/11/20 88  ? ?Wt Readings from Last 3 Encounters:  ?04/07/22 194 lb (88 kg)  ?03/26/22 194 lb 9.6 oz (88.3 kg)  ?03/27/21 195 lb 9.6 oz (88.7 kg)  ? ?BMI Readings from Last 3 Encounters:  ?04/07/22 31.31 kg/m?  ?03/26/22 31.41 kg/m?  ?03/27/21 28.89 kg/m?  ? ? ?Assessment/Interventions: Review of patient past medical history, allergies, medications, health status, including review of consultants reports, laboratory and other test data, was performed as part of comprehensive evaluation and provision of chronic care management services.  ? ?SDOH:  (Social Determinants of Health) assessments and interventions performed: Yes ?SDOH Interventions   ? ?Flowsheet Row Most Recent Value  ?SDOH Interventions   ?Financial Strain Interventions Intervention Not Indicated  ?Transportation Interventions Intervention Not Indicated  ? ?  ? ?SDOH  Screenings  ? ?Alcohol Screen: Low Risk   ? Last Alcohol Screening Score (AUDIT): 0  ?Depression (PHQ2-9): Low Risk   ? PHQ-2 Score: 0  ?Financial Resource Strain: Low Risk   ? Difficulty of Paying Living Expenses: Not hard at all  ?  Food Insecurity: No Food Insecurity  ? Worried About Charity fundraiser in the Last Year: Never true  ? Ran Out of Food in the Last Year: Never true  ?Housing: Low Risk   ? Last Housing Risk Score: 0  ?Physical Activity: Insufficiently Active  ? Days of Exercise per Week: 7 days  ? Minutes of Exercise per Session: 20 min  ?Social Connections: Socially Isolated  ? Frequency of Communication with Friends and Family: More than three times [place]ing week  ? Frequency of Social Gatherings with Friends and Family: More than three times [place]ing week  ? Attends Religious Services: Never  ? Active Member of Clubs or Organizations: No  ? Attends Archivist Meetings: Never  ? Marital Status: Never married  ?Stress: No Stress Concern Present  ? Feeling of Stress : Not at all  ?Tobacco Use: Low Risk   ? Smoking Tobacco Use: Never  ? Smokeless Tobacco Use: Never  ? Passive Exposure: Not on file  ?Transportation Needs: No Transportation Needs  ? Lack of Transportation (Medical): No  ? Lack of Transportation (Non-Medical): No  ? ? ?CCM Care Plan ? ?Allergies  ?Allergen Reactions  ? Cinnamon Flavor Other (See Comments)  ?  Cinnamon chewing gum caused blisters on tongue  ? ? ?Medications Reviewed Today   ? ? Reviewed by Mcarthur Rossetti, MD (Physician) on 04/08/22 at 1447  Med List Status: <None>  ? ?Medication Order Taking? Sig Documenting Provider Last Dose Status Informant  ?Ascorbic Acid (VITAMIN C) 1000 MG tablet [id number] No Take 1,000 mg by mouth at bedtime. [provider] Taking Active Self  ?aspirin EC 81 MG EC tablet [id number] No Take 1 tablet (81 mg total) by mouth 2 (two) times daily after [place]ing meal.  ?Patient taking differently: Take 81 mg by mouth daily.  ? Mcarthur Rossetti,  MD Taking Active   ?atorvastatin (LIPITOR) 10 MG tablet [id number] No Take 1 tablet (10 mg total) by mouth daily. McElwee, Lauren A, NP Taking Active   ?blood glucose meter kit and supplies KIT [id number] No

## 2022-04-02 NOTE — Progress Notes (Signed)
? ? ?  Chronic Care Management ?Pharmacy Assistant  ? ?Name: Richard Harding  MRN: [id number] DOB: 03-18-1946 ? ?Chart Review for the clinical pharmacist for 04/02/2022 at 1:00 pm. ? ?Conditions to be addressed/monitored: ?HTN, HLD, DMII, and Arthritis of right and left knee ? ?Primary concerns for visit include: ?None ID  ? ?Recent office visits:  ?03/26/2022 Vance Peper NP (PCP) stop the cinnamon supplement, start Lotrisone Cream 1-0.05%,  Follow-up in 2 months, Ambulatory referral to Orthopedic Surgery, Ambulatory referral to Dermatology, AMB Referral to [facility] ? ?Recent consult visits:  ?No recent consult visit ? ?Hospital visits:  ?None in previous 6 months ? ?Medications: ?Outpatient Encounter Medications as of 04/02/2022  ?Medication Sig  ? Ascorbic Acid (VITAMIN C) 1000 MG tablet Take 2,000 mg by mouth at bedtime.   ? aspirin EC 81 MG EC tablet Take 1 tablet (81 mg total) by mouth 2 (two) times daily after a meal.  ? atorvastatin (LIPITOR) 10 MG tablet Take 1 tablet (10 mg total) by mouth daily.  ? blood glucose meter kit and supplies KIT Check blood glucose once daily. E11.9.  ? chlorthalidone (HYGROTON) 25 MG tablet Take 1 tablet (25 mg total) by mouth daily.  ? clotrimazole-betamethasone (LOTRISONE) cream Apply 1 application. topically daily.  ? Continuous Blood Gluc Receiver ([place]) DEVI 1 each by Does not apply route daily.  ? Continuous Blood Gluc Sensor (DEXCOM G6 SENSOR) MISC 3 each by Does not apply route every 14 (fourteen) days.  ? fenofibrate 54 MG tablet Take 1 tablet (54 mg total) by mouth daily.  ? Flaxseed, Linseed, (FLAX SEED OIL PO) Take 1 capsule by mouth daily.   ? fluticasone (CUTIVATE) 0.05 % cream Apply topically 2 (two) times daily.  ? GLUCOSA-CHONDR-NA CHONDR-MSM PO Take 2 capsules by mouth at bedtime.   ? metFORMIN (GLUCOPHAGE-XR) 500 MG 24 hr tablet Take 1 tablet (500 mg total) by mouth every evening.  ? metoprolol tartrate (LOPRESSOR) 50 MG  tablet Take 1 tablet (50 mg total) by mouth 2 (two) times daily.  ? niacin 500 MG tablet Take 1 tablet (500 mg total) by mouth daily.  ? omega-3 acid ethyl esters (LOVAZA) 1 g capsule Take 1 capsule (1 g total) by mouth 4 (four) times daily.  ? potassium bicarbonate (K-LYTE) 25 MEQ disintegrating tablet Take 25 mEq by mouth 2 (two) times daily.  ? Red Yeast Rice 600 MG CAPS Take 1 capsule (600 mg total) by mouth daily.  ? ?No facility-administered encounter medications on file as of 04/02/2022.  ? ? ?Care Gaps: ?Foot Exam  ?Colonoscopy Exam ?COVID-19 Vaccine ?Ophthalmology Exam ?Tetanus Vaccine ?HTN: 150/78 on 03/26/2022 ? ?Star Rating Drugs: ?Atorvastatin 10 mg last filled 03/04/2022 for 90 day supply at [facility]. ?Metformin 500 mg last filled 03/26/2022 for 90 day supply at  [facility]. ? ?Medication Fill Gaps: ?potassium bicarbonate  25 MEQ last filled 03/01/2020 for 4 day supply ? ?Bessie Kellihan,CPA ?Clinical Pharmacist Assistant ?[phone]  ? ?

## 2022-04-07 ENCOUNTER — Telehealth: Payer: Self-pay

## 2022-04-07 ENCOUNTER — Ambulatory Visit (INDEPENDENT_AMBULATORY_CARE_PROVIDER_SITE_OTHER): Payer: Medicare Other

## 2022-04-07 VITALS — Wt 194.0 lb

## 2022-04-07 DIAGNOSIS — Z0000 Encounter for general adult medical examination without abnormal findings: Secondary | ICD-10-CM

## 2022-04-07 NOTE — Progress Notes (Signed)
? ?Subjective:  ? Richard Harding is a 76 y.o. male who presents for an Initial Medicare Annual Wellness Visit. ? ?Virtual Visit via Telephone Note ? ?I connected with  [name] on 04/07/22 at  9:00 AM EDT by telephone and verified that I am speaking with the correct person using two identifiers. ? ?Location: ?Patient: Home ?Provider: [facility] Cherylann Banas ?Persons participating in the virtual visit: patient/Nurse Health Advisor ?  ?I discussed the limitations, risks, security and privacy concerns of performing an evaluation and management service by telephone and the availability of in person appointments. The patient expressed understanding and agreed to proceed. ? ?Interactive audio and video telecommunications were attempted between this nurse and patient, however failed, due to patient having technical difficulties OR patient did not have access to video capability.  We continued and completed visit with audio only. ? ?Some vital signs may be absent or patient reported.  ? ?Zanyah Lentsch Dionne Ano, LPN  ? ?Review of Systems    ? ?Cardiac Risk Factors include: advanced age (>31mn, >>35women);diabetes mellitus;dyslipidemia;hypertension;male gender ? ?   ?Objective:  ?  ?Today's Vitals  ? 04/07/22 1600  ?Weight: 194 lb (88 kg)  ? ?Body mass index is 31.31 kg/m?. ? ? ?  04/07/2022  ?  5:01 PM 04/26/2020  ?  5:00 PM 04/17/2020  ?  2:27 PM 03/16/2016  ? 11:49 AM 02/08/2015  ?  9:32 AM  ?Advanced Directives  ?Does Patient Have a Medical Advance Directive? No No No No No  ?Would patient like information on creating a medical advance directive? Yes (MAU/Ambulatory/Procedural Areas - Information given) No - Patient declined  Yes - Educational materials given No - patient declined information  ? ? ?Current Medications (verified) ?Outpatient Encounter Medications as of 04/07/2022  ?Medication Sig  ? Ascorbic Acid (VITAMIN C) 1000 MG tablet Take 1,000 mg by mouth at bedtime.  ? aspirin EC 81 MG EC tablet Take 1 tablet (81 mg  total) by mouth 2 (two) times daily after a meal. (Patient taking differently: Take 81 mg by mouth daily.)  ? atorvastatin (LIPITOR) 10 MG tablet Take 1 tablet (10 mg total) by mouth daily.  ? blood glucose meter kit and supplies KIT Check blood glucose once daily. E11.9.  ? Calcium Carbonate-Vit D-Min (CALCIUM 1200 PO) Take 1 tablet by mouth every other day. Taking 3x per week  ? chlorthalidone (HYGROTON) 25 MG tablet Take 1 tablet (25 mg total) by mouth daily.  ? clotrimazole-betamethasone (LOTRISONE) cream Apply 1 application. topically daily.  ? fenofibrate 54 MG tablet Take 1 tablet (54 mg total) by mouth daily.  ? Flaxseed, Linseed, (FLAX SEED OIL PO) Take 2 capsules by mouth daily.  ? fluticasone (CUTIVATE) 0.05 % cream Apply topically 2 (two) times daily.  ? GLUCOSA-CHONDR-NA CHONDR-MSM PO Take 1 capsule by mouth at bedtime.  ? metFORMIN (GLUCOPHAGE-XR) 500 MG 24 hr tablet Take 1 tablet (500 mg total) by mouth every evening.  ? metoprolol tartrate (LOPRESSOR) 50 MG tablet Take 1 tablet (50 mg total) by mouth 2 (two) times daily. (Patient taking differently: Take 100 mg by mouth daily.)  ? omega-3 acid ethyl esters (LOVAZA) 1 g capsule Take 1 capsule (1 g total) by mouth 4 (four) times daily. (Patient taking differently: Take 4 g by mouth daily.)  ? potassium bicarbonate (K-LYTE) 25 MEQ disintegrating tablet Take 99 mEq by mouth daily.  ? Red Yeast Rice 600 MG CAPS Take 1 capsule (600 mg total) by mouth daily.  ?  Continuous Blood Gluc Receiver ([place]) DEVI 1 each by Does not apply route daily. (Patient not taking: Reported on 04/07/2022)  ? Continuous Blood Gluc Sensor (DEXCOM G6 SENSOR) MISC 3 each by Does not apply route every 14 (fourteen) days. (Patient not taking: Reported on 04/07/2022)  ? niacin 500 MG tablet Take 1 tablet (500 mg total) by mouth daily. (Patient not taking: Reported on 04/07/2022)  ? ?No facility-administered encounter medications on file as of 04/07/2022.  ? ? ?Allergies  (verified) ?Cinnamon flavor  ? ?History: ?Past Medical History:  ?Diagnosis Date  ? Arthritis   ? Arthritis of right knee 04/25/2020  ? Carpal tunnel syndrome, bilateral   ? Chickenpox   ? Diabetes mellitus without complication ([place])   ? Hyperlipemia   ? Hypertension   ? RBBB 04/17/2020  ? Noted on EKG  ? ?Past Surgical History:  ?Procedure Laterality Date  ? BILATERAL CARPAL TUNNEL RELEASE    ? CERVICAL FUSION    ? COLONOSCOPY  2011  ? 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 Procedure: RIGHT TOTAL KNEE ARTHROPLASTY;  Surgeon: Mcarthur Rossetti, MD;  Location: [facility];  Service: Orthopedics;  Laterality: Right;  ? ?History reviewed. No pertinent family history. ?Social History  ? ?Socioeconomic History  ? Marital status: Single  ?  Spouse name: Not on file  ? Number of children: 0  ? Years of education: Not on file  ? Highest education level: Not on file  ?Occupational History  ? Occupation: retired  ?Tobacco Use  ? Smoking status: Never  ? Smokeless tobacco: Never  ?Vaping Use  ? Vaping Use: Never used  ?Substance and Sexual Activity  ? Alcohol use: No  ? Drug use: No  ? Sexual activity: Not on file  ?Other Topics Concern  ? Not on file  ?Social History Narrative  ? Single - lives alone - no children - no family nearby  ? ?Social Determinants of Health  ? ?Financial Resource Strain: Low Risk   ? Difficulty of Paying Living Expenses: Not hard at all  ?Food Insecurity: No Food Insecurity  ? Worried About Charity fundraiser in the Last Year: Never true  ? Ran Out of Food in the Last Year: Never true  ?Transportation Needs: No Transportation Needs  ? Lack of Transportation (Medical): No  ? Lack of Transportation (Non-Medical): No  ?Physical Activity: Insufficiently Active  ? Days of Exercise per Week: 7 days  ? Minutes of Exercise per Session: 20 min  ?Stress: No Stress Concern Present  ? Feeling of Stress : Not at all  ?Social Connections: Socially Isolated  ? Frequency of Communication with Friends and Family:  More than three times a week  ? Frequency of Social Gatherings with Friends and Family: More than three times a week  ? Attends Religious Services: Never  ? Active Member of Clubs or Organizations: No  ? Attends Archivist Meetings: Never  ? Marital Status: Never married  ? ? ?Tobacco Counseling ?Counseling given: Not Answered ? ? ?Clinical Intake: ? ?Pre-visit preparation completed: Yes ? ?Pain : No/denies pain ? ?  ? ?BMI - recorded: 31.31 ?Nutritional Status: BMI > 30  Obese ?Nutritional Risks: None ?Diabetes: Yes ?CBG done?: No ?Did pt. bring in CBG monitor from home?: No ? ?How often do you need to have someone help you when you read instructions, pamphlets, or other written materials from your doctor or pharmacy?: 1 - Never ? ?Diabetic? Nutrition Risk Assessment: ? ?Has  the patient had any N/V/D within the last 2 months?  No  ?Does the patient have any non-healing wounds?  No  ?Has the patient had any unintentional weight loss or weight gain?  No  ? ?Diabetes: ? ?Is the patient diabetic?  Yes  ?If diabetic, was a CBG obtained today?  No  ?Did the patient bring in their glucometer from home?  No  ?How often do you monitor your CBG's? Not checking right now - He doesn't know how to use his CGM - I sent a message to schedule appt to teach him ? ?Financial Strains and Diabetes Management: ? ?Are you having any financial strains with the device, your supplies or your medication? No .  ?Does the patient want to be seen by Chronic Care Management for management of their diabetes?  Yes  ?Would the patient like to be referred to a Nutritionist or for Diabetic Management?  No  ? ?Diabetic Exams: ? ?Diabetic Eye Exam: Completed 2020. Overdue for diabetic eye exam. Pt has been advised about the importance in completing this exam. He is planning to call [facility] near our office to make appt soon. ? ?Diabetic Foot Exam: Completed 09/14/2017. Pt has been advised about the importance in completing this exam. Pt is  scheduled for diabetic foot exam on next visit.   ? ?Interpreter Needed?: No ? ?Information entered by :: Zaylin Pistilli, LPN ? ? ?Activities of Daily Living ? ?  04/07/2022  ?  4:51 PM  ?In your present state of h

## 2022-04-07 NOTE — Telephone Encounter (Signed)
He recently got DEXCOM CGM, but hasn't been instructed on how to use it. He has appt with Pharmacist 05/14/22, but really needs to know how to go ahead and use his CGM. Thanks. ?

## 2022-04-07 NOTE — Patient Instructions (Signed)
Richard Harding , ?Thank you for taking time to come for your Medicare Wellness Visit. I appreciate your ongoing commitment to your health goals. Please review the following plan we discussed and let me know if I can assist you in the future.  ? ?Screening recommendations/referrals: ?Colonoscopy: Done 01/16/2010 - Recommended Repeat in 10 years *past due - discuss with your provider ?Recommended yearly ophthalmology/optometry visit for glaucoma screening and checkup ?Recommended yearly dental visit for hygiene and checkup ? ?Vaccinations: ?Influenza vaccine: Do this annually in fall  ?Pneumococcal vaccine: Done 01/18/2014 & 09/16/2015 ?Tdap vaccine: Done 01/17/2012 -Repeat in 10 years ?Shingles vaccine: Done 12/6/209 & 05/28/2019   ?Covid-19:  Done 02/03/2020, 03/02/2020, 09/25/2020, 03/20/2021, & 10/03/2021 ? ?Advanced directives: Advance directive discussed with you today. I have provided a copy for you to complete at home and have notarized. Once this is complete please bring a copy in to our office so we can scan it into your chart.  ? ?Conditions/risks identified: Aim for 30 minutes of exercise or brisk walking, 6-8 glasses of water, and 5 servings of fruits and vegetables each day.  ? ?Next appointment: Follow up in one year for your annual wellness visit.  ? ?Preventive Care 38 Years and Older, Male ? ?Preventive care refers to lifestyle choices and visits with your health care provider that can promote health and wellness. ?What does preventive care include? ?A yearly physical exam. This is also called an annual well check. ?Dental exams once or twice a year. ?Routine eye exams. Ask your health care provider how often you should have your eyes checked. ?Personal lifestyle choices, including: ?Daily care of your teeth and gums. ?Regular physical activity. ?Eating a healthy diet. ?Avoiding tobacco and drug use. ?Limiting alcohol use. ?Practicing safe sex. ?Taking low doses of aspirin every day. ?Taking vitamin and mineral  supplements as recommended by your health care provider. ?What happens during an annual well check? ?The services and screenings done by your health care provider during your annual well check will depend on your age, overall health, lifestyle risk factors, and family history of disease. ?Counseling  ?Your health care provider may ask you questions about your: ?Alcohol use. ?Tobacco use. ?Drug use. ?Emotional well-being. ?Home and relationship well-being. ?Sexual activity. ?Eating habits. ?History of falls. ?Memory and ability to understand (cognition). ?Work and work Statistician. ?Screening  ?You may have the following tests or measurements: ?Height, weight, and BMI. ?Blood pressure. ?Lipid and cholesterol levels. These may be checked every 5 years, or more frequently if you are over 18 years old. ?Skin check. ?Lung cancer screening. You may have this screening every year starting at age 37 if you have a 30-pack-year history of smoking and currently smoke or have quit within the past 15 years. ?Fecal occult blood test (FOBT) of the stool. You may have this test every year starting at age 8. ?Flexible sigmoidoscopy or colonoscopy. You may have a sigmoidoscopy every 5 years or a colonoscopy every 10 years starting at age 24. ?Prostate cancer screening. Recommendations will vary depending on your family history and other risks. ?Hepatitis C blood test. ?Hepatitis B blood test. ?Sexually transmitted disease (STD) testing. ?Diabetes screening. This is done by checking your blood sugar (glucose) after you have not eaten for a while (fasting). You may have this done every 1-3 years. ?Abdominal aortic aneurysm (AAA) screening. You may need this if you are a current or former smoker. ?Osteoporosis. You may be screened starting at age 36 if you are at high risk. ?  Talk with your health care provider about your test results, treatment options, and if necessary, the need for more tests. ?Vaccines  ?Your health care provider  may recommend certain vaccines, such as: ?Influenza vaccine. This is recommended every year. ?Tetanus, diphtheria, and acellular pertussis (Tdap, Td) vaccine. You may need a Td booster every 10 years. ?Zoster vaccine. You may need this after age 74. ?Pneumococcal 13-valent conjugate (PCV13) vaccine. One dose is recommended after age 89. ?Pneumococcal polysaccharide (PPSV23) vaccine. One dose is recommended after age 58. ?Talk to your health care provider about which screenings and vaccines you need and how often you need them. ?This information is not intended to replace advice given to you by your health care provider. Make sure you discuss any questions you have with your health care provider. ?Document Released: 01/03/2016 Document Revised: 08/26/2016 Document Reviewed: 10/08/2015 ?Elsevier Interactive Patient Education ? 2017 [place]. ? ?Fall Prevention in the Home ?Falls can cause injuries. They can happen to people of all ages. There are many things you can do to make your home safe and to help prevent falls. ?What can I do on the outside of my home? ?Regularly fix the edges of walkways and driveways and fix any cracks. ?Remove anything that might make you trip as you walk through a door, such as a raised step or threshold. ?Trim any bushes or trees on the path to your home. ?Use bright outdoor lighting. ?Clear any walking paths of anything that might make someone trip, such as rocks or tools. ?Regularly check to see if handrails are loose or broken. Make sure that both sides of any steps have handrails. ?Any raised decks and porches should have guardrails on the edges. ?Have any leaves, snow, or ice cleared regularly. ?Use sand or salt on walking paths during winter. ?Clean up any spills in your garage right away. This includes oil or grease spills. ?What can I do in the bathroom? ?Use night lights. ?Install grab bars by the toilet and in the tub and shower. Do not use towel bars as grab bars. ?Use  non-skid mats or decals in the tub or shower. ?If you need to sit down in the shower, use a plastic, non-slip stool. ?Keep the floor dry. Clean up any water that spills on the floor as soon as it happens. ?Remove soap buildup in the tub or shower regularly. ?Attach bath mats securely with double-sided non-slip rug tape. ?Do not have throw rugs and other things on the floor that can make you trip. ?What can I do in the bedroom? ?Use night lights. ?Make sure that you have a light by your bed that is easy to reach. ?Do not use any sheets or blankets that are too big for your bed. They should not hang down onto the floor. ?Have a firm chair that has side arms. You can use this for support while you get dressed. ?Do not have throw rugs and other things on the floor that can make you trip. ?What can I do in the kitchen? ?Clean up any spills right away. ?Avoid walking on wet floors. ?Keep items that you use a lot in easy-to-reach places. ?If you need to reach something above you, use a strong step stool that has a grab bar. ?Keep electrical cords out of the way. ?Do not use floor polish or wax that makes floors slippery. If you must use wax, use non-skid floor wax. ?Do not have throw rugs and other things on the floor that can make  you trip. ?What can I do with my stairs? ?Do not leave any items on the stairs. ?Make sure that there are handrails on both sides of the stairs and use them. Fix handrails that are broken or loose. Make sure that handrails are as long as the stairways. ?Check any carpeting to make sure that it is firmly attached to the stairs. Fix any carpet that is loose or worn. ?Avoid having throw rugs at the top or bottom of the stairs. If you do have throw rugs, attach them to the floor with carpet tape. ?Make sure that you have a light switch at the top of the stairs and the bottom of the stairs. If you do not have them, ask someone to add them for you. ?What else can I do to help prevent falls? ?Wear  shoes that: ?Do not have high heels. ?Have rubber bottoms. ?Are comfortable and fit you well. ?Are closed at the toe. Do not wear sandals. ?If you use a stepladder: ?Make sure that it is fully opened. Do not climb

## 2022-04-08 ENCOUNTER — Ambulatory Visit (INDEPENDENT_AMBULATORY_CARE_PROVIDER_SITE_OTHER): Payer: Medicare Other

## 2022-04-08 ENCOUNTER — Ambulatory Visit (INDEPENDENT_AMBULATORY_CARE_PROVIDER_SITE_OTHER): Payer: Medicare Other | Admitting: Orthopaedic Surgery

## 2022-04-08 ENCOUNTER — Ambulatory Visit: Payer: Self-pay

## 2022-04-08 DIAGNOSIS — M25561 Pain in right knee: Secondary | ICD-10-CM

## 2022-04-08 DIAGNOSIS — M25562 Pain in left knee: Secondary | ICD-10-CM | POA: Diagnosis not present

## 2022-04-08 DIAGNOSIS — G8929 Other chronic pain: Secondary | ICD-10-CM

## 2022-04-08 DIAGNOSIS — Z96651 Presence of right artificial knee joint: Secondary | ICD-10-CM | POA: Diagnosis not present

## 2022-04-08 DIAGNOSIS — M1712 Unilateral primary osteoarthritis, left knee: Secondary | ICD-10-CM | POA: Diagnosis not present

## 2022-04-08 HISTORY — DX: Unilateral primary osteoarthritis, left knee: M17.12

## 2022-04-08 NOTE — Progress Notes (Signed)
The patient is a 76 year old gentleman well-known to me.  We replaced his right knee in May 2021 with a press-fit implant.  He said the knee still does very well and does not hurt with good range of motion and no signs of instability.  He does state it still swells on occasion.  His left knee has well-documented severe arthritis and he wants to consider knee replacement for his left knee in October of this year when he will have people in town to help him out from a recovery standpoint.  He has had no other acute change in medical status.  His left knee does hurt on a daily basis and it is detrimentally affecting his mobility, his quality of life and his actives day living. ? ?X-rays of the right knee shows a well-seated press-fit total knee arthroplasty with good alignment and no evidence of loosening.  X-rays of the left knee show tricompartment arthritis with varus malalignment, significant medial and patellofemoral joint space narrowing and periarticular osteophytes. ? ?Examination of his right knee does not show any swelling today in terms of the joint itself.  I think what the patient refers to his swelling is actually in the anatomy of the medial femoral condyle.  He has full range of motion of his right knee and is ligamentously stable.  His incision is well-healed.  Examination of his left knee shows varus malalignment.  There is medial and lateral joint line tenderness as well as patellofemoral crepitation and tenderness. ? ?We will see about setting him up for surgery in October per his request.  That is 6 months from now.  He will let us know if there is any acute change in his medical status prior to then.  All questions and concerns were answered and  addressed. ? ? ? ? ? ? ? ? ? ? ? ? ? ? ? ? ? ? ? ? ? ? ? ? ? ? ? ? ? ? ? ? ? ? ? ? ? ? ? ? ? ? ? ? ? ? ? ? ? ? ? ? ? ? ? ? ? ? ? ? ? ? ? ? ? ? ? ? ? ? ? ? ? ? ? ? ? ? ? ? ? ? ? ? ? ? ? ? ? ? ? ? ? ? ? ? ? ? ? ? ? ? ? ? ? ? ? ? ? ? ? ? ? ? ? ? ? ? ? ? ? ? ? ? ? ? ? ? ? ? ? ? ? ? ? ? ? ? ? ? ? ? ? ? ? ? ? ? ? ? ? ? ? ? ? ? ? ? ? ? ? ? ? ? ? ? ? ? ? ? ? ? ? ? ? ? ? ? ? ? ? ? ? ? ? ? ? ? ? ? ? ? ? ? ? ? ? ? ? ? ? ? ? ? ? ? ? ? ? ? ? ? ? ? ? ? ? ? ? ? ? ? ? ? ? ? ? ? ? ? ? ? ? ? ? ? ? ? ? ? ? ? ? ? ? ? ? ? ? ? ? ? ? ? ? ? ? ? ? ? ? ? ? ? ? ? ? ? ? ? ? ? ? ? ? ? ? ? ? ? ? ? ? ? ? ? ? ? ? ? ? ? ? ? ? ? ? ? ? ? ? ? ? ? ? ? ? ? ? ? ? ? ? ? ? ? ? ? ? ? ? ? ? ? ? ? ? ? ? ? ? ? ? ? ? ? ? ? ? ? ? ? ? ? ? ? ? ? ? ? ? ? ? ? ? ? ? ? ? ? ? ? ? ? ? ? ? ? ? ? ? ? ? ? ? ? ? ? ? ? ? ? ? ? ? ? ? ? ? ? ? ? ? ? ? ? ? ? ? ? ? ? ? ? ? ? ? ? ? ? ? ? ? ? ? ? ? ? ? ? ? ? ? ? ? ? ? ? ? ? ? ? ? ? ? ? ? ? ? ? ? ? ? ? ? ? ? ? ? ? ? ? ? ? ? ? ? ? ? ? ? ? ? ? ? ? ? ? ? ? ? ? ? ? ? ? ? ? ? ? ? ? ? ? ? ? ? ? ? ? ? ? ? ? ? ? ? ? ? ? ? ? ? ? ? ? ? ? ? ? ? ? ? ? ? ? ? ? ? ? ? ? ? ? ? ? ? ? ? ? ? ? ? ? ? ? ? ? ? ? ? ? ? ? ? ? ? ? ? ? ? ? ? ? ? ? ? ? ? ? ? ? ? ? ? ? ? ? ? ? ? ? ? ? ? ? ? ? ? ? ? ? ? ? ? ? ? ? ? ? ? ? ? ? ? ? ? ? ? ? ? ? ? ? ? ? ? ? ? ? ? ? ? ? ? ? ? ? ? ? ? ? ? ? ? ? ? ? ? ? ? ? ? ? ? ? ? ? ? ? ? ? ? ? ? ? ? ? ? ? ? ? ? ? ? ? ? ? ? ? ? ? ? ? ? ? ? ? ? ? ? ? ? ? ? ? ? ? ? ? ? ? ? ? ? ? ? ? ? ? ? ? ? ? ? ? ? ? ? ? ? ? ? ? ? ? ? ? ? ? ? ? ? ? ? ? ? ? ? ? ? ? ? ? ? ? ? ? ? ? ? ? ? ? ? ? ? ? ? ? ? ? ? ? ? ? ? ? ? ? ? ? ? ? ? ? ? ? ? ? ? ? ? ? ? ? ? ? ? ? ? ? ? ? ? ? ? ? ? ? ? ? ? ? ? ? ? ? ? ? ? ? ? ? ? ? ? ? ? ? ? ? ? ? ? ? ? ? ? ? ? ? ? ? ? ? ? ? ? ? ? ? ? ? ? ? ? ? ? ? ? ? ? ? ? ? ? ? ? ? ? ? ? ? ? ? ? ? ? ? ? ? ? ? ? ? ? ? ? ? ? ? ? ? ? ? ? ? ? ? ? ? ? ? ? ? ? ? ? ? ? ? ? ? ? ? ? ? ? ? ? ? ? ? ? ? ? ? ? ? ? ? ? ? ? ? ? ? ? ? ? ? ? ? ? ? ? ? ? ? ? ? ? ? ? ? ? ? ? ? ? ? ? ? ? ? ? ? ? ? ? ? ? ? ? ? ? ? ? ? ? ? ? ? ? ? ? ? ? ? ? ? ? ? ? ? ? ? ? ?  ? ? ? ? ? ? ? ? ? ? ? ? ? ? ? ? ? ? ? ? ? ? ? ? ? ? ? ? ? ? ? ? ? ? ? ? ? ? ? ? ? ? ? ? ? ? ? ? ? ? ? ? ? ? ? ? ? ? ? ? ? ? ? ? ? ? ? ? ? ? ? ? ? ? ? ? ? ? ? ? ? ? ? ? ? ? ? ? ? ? ? ? ? ? ? ? ? ? ? ? ? ? ? ? ? ? ? ? ? ? ? ? ? ? ? ? ? ? ? ? ? ? ? ? ? ? ? ? ? ? ? ? ? ? ? ? ? ? ? ? ? ? ? ? ? ? ? ? ? ? ? ? ? ? ? ? ? ? ? ? ? ? ? ? ? ? ? ? ? ? ? ? ? ? ? ? ? ? ? ? ? ? ? ? ? ? ? ? ? ? ? ? ? ? ? ? ? ? ? ? ? ? ? ? ? ? ? ? ? ? ? ? ? ? ? ? ? ? ? ? ? ? ? ? ? ? ? ? ? ? ? ? ? ? ? ? ? ? ? ? ? ? ? ? ? ? ? ? ? ? ? ? ? ? ? ? ? ? ? ? ? ? ? ? ? ? ? ? ? ? ? ? ? ? ? ? ? ? ? ? ? ? ? ? ? ? ? ? ? ? ? ? ? ? ? ? ? ? ? ? ? ? ? ? ? ? ? ? ? ? ? ? ? ? ? ? ? ? ? ? ? ? ? ? ? ? ? ? ? ? ? ? ? ? ? ? ? ? ? ? ? ? ? ? ? ? ? ? ? ? ? ? ? ? ? ? ? ? ? ? ? ? ? ? ? ? ? ? ? ? ? ? ? ? ? ? ? ? ? ? ? ? ? ? ? ? ? ? ? ? ? ? ? ? ? ? ? ? ? ? ? ? ? ? ? ? ? ? ? ? ? ? ? ? ? ? ? ? ? ? ? ? ? ? ? ? ? ? ? ? ? ? ? ? ? ? ? ? ? ? ? ? ? ? ? ? ? ? ? ? ? ? ? ? ? ? ? ? ? ? ? ? ? ? ? ? ? ? ? ? ? ? ? ? ? ? ? ? ? ? ? ? ? ? ? ? ? ? ? ? ? ? ? ? ? ? ? ? ? ? ? ? ? ? ? ? ? ? ? ? ? ? ? ? ? ? ? ? ? ? ? ? ? ? ? ? ? ? ? ? ? ?+++++++++++++++++++++++++++++++++++++++++++++++++++++++++++++++++++++++++++ ? ? ? ? ? ? ? ? ? ? ? ? ? ? ? ? ? ? ? ? ? ? ? ? ? ? ? ? ? ? ? ? ? ? ? ? ? ? ? ? ? ? ? ? ? ? ? ? ? ? ? ? ? ? ? ? ? ? ? ? ? ? ? ? ? ? ? ? ? ? ? ? ? ? ? ? ? ? ? ? ? ? ? ? ? ? ? ? ? ?

## 2022-04-09 NOTE — Telephone Encounter (Signed)
Called and scheduled pt for [place] 02/14/22 '@[id number]'$  ?

## 2022-04-14 ENCOUNTER — Ambulatory Visit: Payer: Medicare Other

## 2022-04-14 DIAGNOSIS — Z7189 Other specified counseling: Secondary | ICD-10-CM

## 2022-04-14 NOTE — Progress Notes (Signed)
Per orders of lauren [facility] NP, pt is here for Education on Dexcom monitor . pt received verbal instrcutions  on operationof Dexcom g6 glucose monitor Given by [place] L. CMA/CPT.pt has verbalized understanding  ? ?

## 2022-04-17 NOTE — Patient Instructions (Signed)
Visit Information ?It was great speaking with you today!  Please let me know if you have any questions about our visit. ? ? Goals Addressed   ? ?  ?  ?  ?  ? This Visit's Progress  ?  Track and Manage My Blood Pressure-Hypertension     ?  Timeframe:  Long-Range Goal ?Priority:  High ?Start Date: 04/17/22                            ?Expected End Date: 04/18/23                     ? ?Follow Up within 90 days ?  ?- check blood pressure weekly  ?  ?Why is this important?   ?You won't feel high blood pressure, but it can still hurt your blood vessels.  ?High blood pressure can cause heart or kidney problems. It can also cause a stroke.  ?Making lifestyle changes like losing a little weight or eating less salt will help.  ?Checking your blood pressure at home and at different times of the day can help to control blood pressure.  ?If the doctor prescribes medicine remember to take it the way the doctor ordered.  ?Call the office if you cannot afford the medicine or if there are questions about it.   ?  ?Notes:  ?  ? ?  ? ? ?Patient Care Plan: General Pharmacy (Adult)  ?  ? ?Problem Identified: Hypertension, Hyperlipidemia, Diabetes, and Osteoarthritis   ?Priority: High  ?  ? ?Long-Range Goal: Patient-Specific Goal   ?Start Date: 04/17/2022  ?Expected End Date: 04/18/2023  ?This Visit's Progress: On track  ?Priority: High  ?Note:   ?Current Barriers:  ?No barriers noted ? ?Pharmacist Clinical Goal(s):  ?Patient will maintain control of blood pressure as evidenced by BP less than 140/90  through collaboration with PharmD and provider.  ? ?Interventions: ?1:1 collaboration with Charyl Dancer, NP regarding development and update of comprehensive plan of care as evidenced by provider attestation and co-signature ?Inter-disciplinary care team collaboration (see longitudinal plan of care) ?Comprehensive medication review performed; medication list updated in electronic medical record ? ?Hypertension (BP goal  <140/90) ?-Controlled ?-Current treatment: ?Chlorthalidone 25 mg daily  ?Metoprolol XL 50 mg twice daily  ?-Medications previously tried: NA  ?-Current home readings: Does not monitor at home.  ?-Current dietary habits: Limits added salt, but does enjoy canned soup.  ?-Denies hypotensive/hypertensive symptoms ?-Recommended to continue current medication ? ?Hyperlipidemia: (LDL goal < 70) ?-Uncontrolled ?-Current treatment: ?Atorvastatin 10 mg daily  ?Fenofibrate 54 mg daily  ?Lovaza 1 g four times daily  ?-Medications previously tried: NA  ?-Fearful of statins.  ?-Recommended to continue current medication ? ?Diabetes (A1c goal <7%) ?-Controlled ?-Current medications: ?Metformin XR 500 mg nightly  ?-Medications previously tried: NA  ?-Current home glucose readings: NA ?-Denies hypoglycemic/hyperglycemic symptoms ?-Current meal patterns: eating less rice, does still enjoy brown rice, lentils, corn.  ?-Recommended to continue current medication ? ?Health Maintenance ?-Not ideally controlled ?-Medications previously tried: NA  ?-Current treatment  ?Aspirin 81 mg daily  ?Multivitamin 1 tablet daily ?Potassium '99mg'$  daily ?Glucosamine Chondroitin 150 daily ?Vitamin B complex 1 daily  ?Red yeast rice 1,'200mg'$  daily ?Ginger Root '550mg'$  daily ?Garlic 1,'000mg'$  daily ?Ginkgo Biloba '60mg'$  daily ?Ginseng '100mg'$  daily ?Ashwagandha '500mg'$  daily (stress; weak evidence) ?St. John's Wort '300mg'$  daily ?Maca '500mg'$  daily (libido; weak evidence)  ?Saw Palmetto '450mg'$  daily (BPH; likely ineffective)  ?  Calcium 600 mg three times weekly  ?Vitamin C 2000 mg twice daily  ?Flax Seed Oil 400 mg 2 capsules daily  ?Turmeric Curcumin  ?-Educated on Herbal supplement research is limited and benefits usually cannot be proven ?Supplements may interfere with prescription drugs ?-STOP Red Yeast Rice (risk of myalgias)  ?-STOP St. John's Wort (DDI with atorvastatin, many other potential interactions)  ?-STOP Glucosamine HCL 1,500 mg  2 tablets daily  ?-STOP  Flax Seed  ?-DECREASE Calcium to once daily  ? ?Patient Goals/Self-Care Activities ?Patient will:  ?- check glucose weekly, document, and provide at future appointments ?check blood pressure weekly, document, and provide at future appointments ? ?Follow Up Plan: Telephone follow up appointment with care management team member scheduled for:  05/14/2022 at 1:00 PM ?  ? ?Patient agreed to services and verbal consent obtained.  ? ?The patient verbalized understanding of instructions, educational materials, and care plan provided today and declined offer to receive copy of patient instructions, educational materials, and care plan.  ? ?Junius Argyle, PharmD, BCACP, CPP ?Clinical Pharmacist Practitioner  ?[facility]  ?[phone]  ?

## 2022-04-19 DIAGNOSIS — I10 Essential (primary) hypertension: Secondary | ICD-10-CM

## 2022-04-19 DIAGNOSIS — Z7984 Long term (current) use of oral hypoglycemic drugs: Secondary | ICD-10-CM

## 2022-04-19 DIAGNOSIS — E1159 Type 2 diabetes mellitus with other circulatory complications: Secondary | ICD-10-CM

## 2022-04-19 DIAGNOSIS — E785 Hyperlipidemia, unspecified: Secondary | ICD-10-CM | POA: Diagnosis not present

## 2022-04-24 ENCOUNTER — Ambulatory Visit (INDEPENDENT_AMBULATORY_CARE_PROVIDER_SITE_OTHER): Payer: Medicare Other | Admitting: Nurse Practitioner

## 2022-04-24 ENCOUNTER — Encounter: Payer: Self-pay | Admitting: Nurse Practitioner

## 2022-04-24 VITALS — BP 130/80 | HR 81 | Temp 97.8°F | Wt 195.4 lb

## 2022-04-24 DIAGNOSIS — E1151 Type 2 diabetes mellitus with diabetic peripheral angiopathy without gangrene: Secondary | ICD-10-CM | POA: Diagnosis not present

## 2022-04-24 DIAGNOSIS — R21 Rash and other nonspecific skin eruption: Secondary | ICD-10-CM

## 2022-04-24 MED ORDER — METFORMIN HCL ER 500 MG PO TB24: 1000.0000 mg | ORAL_TABLET | Freq: Every day | ORAL | 1 refills | Status: DC

## 2022-04-24 MED ORDER — DEXCOM G6 TRANSMITTER MISC: 1.0000 | 1 refills | Status: DC

## 2022-04-24 NOTE — Assessment & Plan Note (Signed)
His last A1c was 7.1% 1 month ago.  He has started wearing the continuous glucose monitor and has noticed what foods in particular increase his sugars.  He is concerned because his fasting sugars have been 150-160 and after eating his blood sugars have been 250-300.  We will increase his metformin to 1000 mg daily.  Follow-up next month and will repeat labs at this visit. ?

## 2022-04-24 NOTE — Assessment & Plan Note (Signed)
He states that he has an appointment to follow-up on this rash in June.  Clotrimazole cream that was ordered last visit applied to his back today.  Encouraged him to keep this appointment with dermatology. ?

## 2022-04-24 NOTE — Patient Instructions (Signed)
It was great to see you! ? ?Increase your metformin to 2 tablets daily. Keep checking your blood sugar with your sensor.  ? ?Let's follow-up at your next scheduled appointment, sooner if you have concerns. ? ?If a referral was placed today, you will be contacted for an appointment. Please note that routine referrals can sometimes take up to 3-4 weeks to process. Please call our office if you haven't heard anything after this time frame. ? ?Take care, ? ?Vance Peper, NP ? ?

## 2022-04-24 NOTE — Progress Notes (Signed)
? ?Established Patient Office Visit ? ?Subjective   ?Patient ID: Richard Harding, male    DOB: 07-Dec-1946  Age: 76 y.o. MRN: [id number] ? ?Chief Complaint  ?Patient presents with  ? Follow-up  ?  1 mo f/u dexcom education needed for sensor change.   ? ? ?HPI ? ?Richard Harding is here today to follow-up on diabetes. He has been wearing the continuous glucose monitor and states that this has been really helpful knowing how foods affect his blood sugar. His sugars in the morning have been in the 150s-160s.  He is concerned that this is elevated and his sugars have been 250-300 after eating.  He denies urinary frequency, chest pain, shortness of breath. ? ?He has been having itching and burning on his back. He has an appointment with dermatology in June.  He was prescribed clotrimazole cream last visit, however he has not had anybody to put it on his back.  He is asking if we can do this for him today. ? ? ?ROS ?See pertinent positives and negatives per HPI. ? ?  ?Objective:  ?  ? ?BP 130/80 (BP Location: Right Arm, Cuff Size: Normal)   Pulse 81   Temp 97.8 ?F (36.6 ?C) (Temporal)   Wt 195 lb 6.4 oz (88.6 kg)   SpO2 98%   BMI 31.54 kg/m?  ?  ? ?Physical Exam ?Vitals and nursing note reviewed.  ?Constitutional:   ?   Appearance: Normal appearance.  ?HENT:  ?   Head: Normocephalic.  ?Eyes:  ?   Conjunctiva/sclera: Conjunctivae normal.  ?Cardiovascular:  ?   Rate and Rhythm: Normal rate and regular rhythm.  ?   Pulses: Normal pulses.  ?   Heart sounds: Normal heart sounds.  ?Pulmonary:  ?   Effort: Pulmonary effort is normal.  ?   Breath sounds: Normal breath sounds.  ?Musculoskeletal:  ?   Cervical back: Normal range of motion.  ?Skin: ?   General: Skin is warm.  ?   Comments: Numerous scaly, raised nevi to back  ?Neurological:  ?   General: No focal deficit present.  ?   Mental Status: He is alert and oriented to person, place, and time.  ?Psychiatric:     ?   Mood and Affect: Mood normal.     ?   Behavior: Behavior  normal.     ?   Thought Content: Thought content normal.     ?   Judgment: Judgment normal.  ? ? ?No results found for any visits on 04/24/22. ? ? ?The 10-year ASCVD risk score (Arnett DK, et al., 2019) is: 45.5% ? ?  ?Assessment & Plan:  ? ?Problem List Items Addressed This Visit   ? ?  ? Cardiovascular and Mediastinum  ? DM (diabetes mellitus) type II, controlled, with peripheral vascular disorder ([place])  ?  His last A1c was 7.1% 1 month ago.  He has started wearing the continuous glucose monitor and has noticed what foods in particular increase his sugars.  He is concerned because his fasting sugars have been 150-160 and after eating his blood sugars have been 250-300.  We will increase his metformin to 1000 mg daily.  Follow-up next month and will repeat labs at this visit. ? ?  ?  ? Relevant Medications  ? metFORMIN (GLUCOPHAGE-XR) 500 MG 24 hr tablet  ?  ? Musculoskeletal and Integument  ? Rash - Primary  ?  He states that he has an appointment to follow-up on this rash in  June.  Clotrimazole cream that was ordered last visit applied to his back today.  Encouraged him to keep this appointment with dermatology. ? ?  ?  ? ? ?Return if symptoms worsen or fail to improve, for keep next scheduled appointment.  ? ? ?Charyl Dancer, NP ? ?

## 2022-05-13 ENCOUNTER — Telehealth: Payer: Self-pay

## 2022-05-13 NOTE — Progress Notes (Signed)
Chronic  APPOINTMENT REMINDER   Called [facility] [place], No answer, left message of appointment on 05/14/2022 at 1:00 pm via telephone visit with Junius Argyle , Pharm D. Notified to have all medications, supplements, blood pressure and/or blood sugar logs available during appointment and to return call if need to reschedule.  [place] Pharmacist Assistant [phone]

## 2022-05-14 ENCOUNTER — Ambulatory Visit (INDEPENDENT_AMBULATORY_CARE_PROVIDER_SITE_OTHER): Payer: Medicare Other

## 2022-05-14 DIAGNOSIS — E1151 Type 2 diabetes mellitus with diabetic peripheral angiopathy without gangrene: Secondary | ICD-10-CM

## 2022-05-14 DIAGNOSIS — M1712 Unilateral primary osteoarthritis, left knee: Secondary | ICD-10-CM

## 2022-05-14 NOTE — Patient Instructions (Signed)
Visit Information It was great speaking with you today!  Please let me know if you have any questions about our visit.   Goals Addressed             This Visit's Progress    Track and Manage My Blood Pressure-Hypertension   On track    Timeframe:  Long-Range Goal Priority:  High Start Date: 04/17/22                            Expected End Date: 04/18/23                      Follow Up within 90 days   - check blood pressure weekly    Why is this important?   You won't feel high blood pressure, but it can still hurt your blood vessels.  High blood pressure can cause heart or kidney problems. It can also cause a stroke.  Making lifestyle changes like losing a little weight or eating less salt will help.  Checking your blood pressure at home and at different times of the day can help to control blood pressure.  If the doctor prescribes medicine remember to take it the way the doctor ordered.  Call the office if you cannot afford the medicine or if there are questions about it.     Notes:         Patient Care Plan: General Pharmacy (Adult)     Problem Identified: Hypertension, Hyperlipidemia, Diabetes, and Osteoarthritis   Priority: High     Long-Range Goal: Patient-Specific Goal   Start Date: 04/17/2022  Expected End Date: 04/18/2023  This Visit's Progress: On track  Recent Progress: On track  Priority: High  Note:   Current Barriers:  No barriers noted  Pharmacist Clinical Goal(s):  Patient will maintain control of blood pressure as evidenced by BP less than 140/90  through collaboration with PharmD and provider.   Interventions: 1:1 collaboration with Charyl Dancer, NP regarding development and update of comprehensive plan of care as evidenced by provider attestation and co-signature Inter-disciplinary care team collaboration (see longitudinal plan of care) Comprehensive medication review performed; medication list updated in electronic medical  record  Hypertension (BP goal <140/90) -Controlled -Current treatment: Chlorthalidone 25 mg daily  Metoprolol XL 50 mg twice daily  -Medications previously tried: NA  -Current home readings: Does not monitor at home.  -Current dietary habits: Limits added salt, but does enjoy canned soup.  -Denies hypotensive/hypertensive symptoms -Recommended to continue current medication  Hyperlipidemia: (LDL goal < 70) -Uncontrolled -Current treatment: Atorvastatin 10 mg daily  Fenofibrate 54 mg daily  Lovaza 1 g four times daily  -Medications previously tried: NA  -Fearful of statins.  -Recommended to continue current medication  Diabetes (A1c goal <7%) -Controlled -Current medications: Metformin XR 1000 mg nightly  -Medications previously tried: NA  -Current home glucose readings: NA -Denies hypoglycemic/hyperglycemic symptoms -Current meal patterns: eating less rice, does still enjoy brown rice, lentils, corn.  -Breakfast: Low sugar cereal.  -Recommended to continue current medication  Osteoarthritis (Goal: [place] pain) -Not ideally controlled -Current treatment  Naproxen 220 mg 1-2 times daily  -Medications previously tried: NA -Discussed risks of chronic NSAID use.  -Recommend starting acetaminophen CR 650 mg twice daily as needed -Recommended taking naproxen only as needed for moderate-severe arthritis pain.   Health Maintenance -Not ideally controlled -Medications previously tried: NA  -Current treatment  Aspirin 81 mg daily  Potassium '99mg'$  daily Glucosamine Chondroitin 150 daily  Red yeast rice 1,'200mg'$  daily Calcium 600 mg three times weekly  Flax Seed Oil 400 mg 2 capsules daily -Educated on Herbal supplement research is limited and benefits usually cannot be proven Supplements may interfere with prescription drugs -STOP Red Yeast Rice (risk of myalgias)  -STOP Flax Seed   Patient Goals/Self-Care Activities Patient will:  - check glucose weekly, document, and  provide at future appointments check blood pressure weekly, document, and provide at future appointments  Follow Up Plan: Telephone follow up appointment with care management team member scheduled for:  08/13/2022 at 1:00 PM    Patient agreed to services and verbal consent obtained.   Patient verbalizes understanding of instructions and care plan provided today and agrees to view in [place]. Active MyChart status and patient understanding of how to access instructions and care plan via MyChart confirmed with patient.     Junius Argyle, PharmD, Para [place], CPP Clinical Pharmacist Practitioner  [facility]  [phone]

## 2022-05-14 NOTE — Progress Notes (Signed)
Chronic Care Management Pharmacy Note  05/14/2022 Name:  Richard Harding MRN:  [id number] DOB:  01/08/1946  Summary: Patient presents for CCM follow-up. Continues to struggle with replacing Dexcom sensors. He has been using naproxen daily for his arthritis.  Recommendations/Changes made from today's visit: -STOP Red Yeast Rice (risk of myalgias)  -STOP Flax Seed  -Discussed risks of chronic NSAID use.  -Recommend starting acetaminophen CR 650 mg twice daily as needed -Recommended taking naproxen only as needed for moderate-severe arthritis pain.   Plan: CPP follow-up 3 months  Subjective: Richard Harding is an 76 y.o. year old male who is a primary patient of McElwee, Lauren A, NP.  The CCM team was consulted for assistance with disease management and care coordination needs.    Engaged with patient by telephone for follow up visit in response to provider referral for pharmacy case management and/or care coordination services.   Consent to Services:  The patient was given information about Chronic Care Management services, agreed to services, and gave verbal consent prior to initiation of services.  Please see initial visit note for detailed documentation.   Patient Care Team: Charyl Dancer, NP as PCP - General (Internal Medicine) Clent Jacks, MD as Consulting Physician (Ophthalmology) Germaine Pomfret, [facility] (Pharmacist)  Recent office visits: 04/24/22: Patient presented to Vance Peper, NP for follow-up. Metformin 1000 mg daily.   03/26/2022 Vance Peper NP (PCP) stop the cinnamon supplement, start Lotrisone Cream 1-0.05%,  Follow-up in 2 months, Ambulatory referral to Orthopedic Surgery, Ambulatory referral to Dermatology, AMB Referral to [place]  Recent consult visits: No recent consult visit  Hospital visits: None in previous 6 months   Objective:  Lab Results  Component Value Date   CREATININE 0.96 03/26/2022   BUN 18  03/26/2022   GFR 69.96 09/04/2020   GFRNONAA >60 04/28/2020   GFRAA >60 04/28/2020   NA 139 03/26/2022   K 3.8 03/26/2022   CALCIUM 9.9 03/26/2022   CO2 27 03/26/2022   GLUCOSE 99 03/26/2022    Lab Results  Component Value Date/Time   HGBA1C 7.1 (A) 03/26/2022 05:18 PM   HGBA1C 7.1 03/26/2022 05:18 PM   HGBA1C 7.1 (A) 03/26/2022 05:18 PM   HGBA1C 7.1 (A) 03/26/2022 05:18 PM   HGBA1C 6.7 (H) 03/26/2022 02:30 PM   HGBA1C 6.8 (H) 09/04/2020 03:15 PM   HGBA1C 6.2 01/12/2019 11:55 AM   GFR 69.96 09/04/2020 03:15 PM   GFR 82.78 02/29/2020 01:56 PM   MICROALBUR 1.3 02/29/2020 01:56 PM   MICROALBUR 1.0 01/12/2019 12:08 PM    Last diabetic Eye exam:  Lab Results  Component Value Date/Time   HMDIABEYEEXA No Retinopathy 12/20/2019 12:00 AM    Last diabetic Foot exam: No results found for: HMDIABFOOTEX   Lab Results  Component Value Date   CHOL 138 03/26/2022   HDL 36 (L) 03/26/2022   LDLCALC 76 03/26/2022   LDLDIRECT 116.0 09/04/2020   TRIG 165 (H) 03/26/2022   CHOLHDL 3.8 03/26/2022       Latest Ref Rng & Units 03/26/2022    2:30 PM 09/04/2020    3:15 PM 02/29/2020    1:56 PM  Hepatic Function  Total Protein 6.1 - 8.1 g/dL 7.2   7.6     Albumin 3.5 - 5.2 g/dL  4.5     AST 10 - 35 U/L _0 ALT 9 - 46 U/L 17   16   30  Alk Phosphatase 39 - 117 U/L  38     Total Bilirubin 0.2 - 1.2 mg/dL 0.5   0.5       Lab Results  Component Value Date/Time   TSH 5.39 (H) 03/26/2022 03:00 PM       Latest Ref Rng & Units 03/26/2022    2:30 PM 04/28/2020    7:45 PM 04/27/2020    3:23 AM  CBC  WBC 3.8 - 10.8 Thousand/uL 7.6   10.8   12.9    Hemoglobin 13.2 - 17.1 g/dL 14.0   11.8   12.1    Hematocrit 38.5 - 50.0 % 41.1   34.0   35.6    Platelets 140 - 400 Thousand/uL 269   256   281      No results found for: VD25OH  Clinical ASCVD: No  The 10-year ASCVD risk score (Arnett DK, et al., 2019) is: 45.5%   Values used to calculate the score:     Age: 31 years     Sex:  Male     Is Non-Hispanic African American: No     Diabetic: Yes     Tobacco smoker: No     Systolic Blood Pressure: 696 mmHg     Is BP treated: Yes     HDL Cholesterol: 36 mg/dL     Total Cholesterol: 138 mg/dL       04/07/2022    5:02 PM 09/04/2020    3:12 PM 01/12/2019   11:48 AM  Depression screen PHQ 2/9  Decreased Interest 0 0 0  Down, Depressed, Hopeless 0 0 0  PHQ - 2 Score 0 0 0     Social History   Tobacco Use  Smoking Status Never  Smokeless Tobacco Never   BP Readings from Last 3 Encounters:  04/24/22 130/80  03/26/22 (!) 150/78  03/27/21 122/80   Pulse Readings from Last 3 Encounters:  04/24/22 81  03/26/22 79  03/20/21 (!) 41   Wt Readings from Last 3 Encounters:  04/24/22 195 lb 6.4 oz (88.6 kg)  04/07/22 194 lb (88 kg)  03/26/22 194 lb 9.6 oz (88.3 kg)   BMI Readings from Last 3 Encounters:  04/24/22 31.54 kg/m  04/07/22 31.31 kg/m  03/26/22 31.41 kg/m    Assessment/Interventions: Review of patient past medical history, allergies, medications, health status, including review of consultants reports, laboratory and other test data, was performed as part of comprehensive evaluation and provision of chronic care management services.   SDOH:  (Social Determinants of Health) assessments and interventions performed: Yes   SDOH Screenings   Alcohol Screen: Low Risk    Last Alcohol Screening Score (AUDIT): 0  Depression (PHQ2-9): Low Risk    PHQ-2 Score: 0  Financial Resource Strain: Low Risk    Difficulty of Paying Living Expenses: Not hard at all  Food Insecurity: No Food Insecurity   Worried About Charity fundraiser in the Last Year: Never true   Ran Out of Food in the Last Year: Never true  Housing: Low Risk    Last Housing Risk Score: 0  Physical Activity: Insufficiently Active   Days of Exercise per Week: 7 days   Minutes of Exercise per Session: 20 min  Social Connections: Socially Isolated   Frequency of Communication with Friends  and Family: More than three times a week   Frequency of Social Gatherings with Friends and Family: More than three times a week   Attends Religious Services: Never   Active  Member of Clubs or Organizations: No   Attends Music therapist: Never   Marital Status: Never married  Stress: No Stress Concern Present   Feeling of Stress : Not at all  Tobacco Use: Low Risk    Smoking Tobacco Use: Never   Smokeless Tobacco Use: Never   Passive Exposure: Not on file  Transportation Needs: No Transportation Needs   Lack of Transportation (Medical): No   Lack of Transportation (Non-Medical): No    CCM Care Plan  Allergies  Allergen Reactions   Cinnamon Flavor Other (See Comments)    Cinnamon chewing gum caused blisters on tongue    Medications Reviewed Today     Reviewed by Charyl Dancer, NP (Nurse Practitioner) on 04/24/22 at 1427  Med List Status: <None>   Medication Order Taking? Sig Documenting Provider Last Dose Status Informant  Ascorbic Acid (VITAMIN C) 1000 MG tablet [id number] No Take 1,000 mg by mouth at bedtime. [provider] Taking Active Self  aspirin EC 81 MG EC tablet [id number] No Take 1 tablet (81 mg total) by mouth 2 (two) times daily after a meal.  Patient taking differently: Take 81 mg by mouth daily.   Mcarthur Rossetti, MD Taking Active   atorvastatin (LIPITOR) 10 MG tablet [id number] No Take 1 tablet (10 mg total) by mouth daily. McElwee, Lauren A, NP Taking Active   blood glucose meter kit and supplies KIT [id number] No Check blood glucose once daily. E11.9. Lucille Passy, MD Taking Active Self  Calcium Carbonate-Vit D-Min (CALCIUM 1200 PO) [id number] No Take 1 tablet by mouth every other day. Taking 3x per week [provider] Taking Active   chlorthalidone (HYGROTON) 25 MG tablet [id number] No Take 1 tablet (25 mg total) by mouth daily. Charyl Dancer, NP Taking Active   clotrimazole-betamethasone (LOTRISONE) cream [id number] No  Apply 1 application. topically daily. Charyl Dancer, NP Taking Active   Continuous Blood Gluc Receiver (DEXCOM G6 RECEIVER) DEVI [id number] No 1 each by Does not apply route daily.  Patient not taking: Reported on 04/07/2022   Charyl Dancer, NP Not Taking Active   Continuous Blood Gluc Sensor (DEXCOM G6 SENSOR) MISC [id number] No 3 each by Does not apply route every 14 (fourteen) days.  Patient not taking: Reported on 04/07/2022   Charyl Dancer, NP Not Taking Active   Continuous Blood Gluc Transmit (DEXCOM G6 TRANSMITTER) MISC [id number] Yes 1 each by Does not apply route continuous. McElwee, Lauren A, NP  Active   fenofibrate 54 MG tablet [id number] No Take 1 tablet (54 mg total) by mouth daily. McElwee, Lauren A, NP Taking Active   Flaxseed, Linseed, (FLAX SEED OIL PO) [id number] No Take 2 capsules by mouth daily. [provider] Taking Active Self  fluticasone (CUTIVATE) 0.05 % cream [id number] No Apply topically 2 (two) times daily. Ronnald Nian, DO Taking Active   GLUCOSA-CHONDR-NA CHONDR-MSM PO [id number] No Take 1 capsule by mouth at bedtime. [provider] Taking Active Self  metFORMIN (GLUCOPHAGE-XR) 500 MG 24 hr tablet [id number]  Take 2 tablets (1,000 mg total) by mouth daily with breakfast. McElwee, Lauren A, NP  Active   metoprolol tartrate (LOPRESSOR) 50 MG tablet [id number] No Take 1 tablet (50 mg total) by mouth 2 (two) times daily.  Patient taking differently: Take 100 mg by mouth daily.   McElwee, Lauren A, NP Taking Active   niacin 500 MG tablet [id number] No Take 1 tablet (500 mg total) by mouth daily.  Patient not taking: Reported on 04/07/2022   Lucille Passy, MD Not Taking Active Self  omega-3 acid ethyl esters (LOVAZA) 1 g capsule [id number] No Take 1 capsule (1 g total) by mouth 4 (four) times daily.  Patient taking differently: Take 4 g by mouth daily.   McElwee, Lauren A, NP Taking Active   potassium bicarbonate (K-LYTE) 25 MEQ disintegrating  tablet [id number] No Take 99 mEq by mouth daily. [provider] Taking Active   Red Yeast Rice 600 MG CAPS [id number] No Take 1 capsule (600 mg total) by mouth daily. Ann Held, DO Taking Active Self            Patient Active Problem List   Diagnosis Date Noted   Unilateral primary osteoarthritis, left knee 04/08/2022   Arthritis of left knee 03/26/2022   Rash 03/26/2022   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/26/2020   Arthritis of right knee 04/25/2020   Hypokalemia 01/12/2019   DM (diabetes mellitus) type II, controlled, with peripheral vascular disorder ([place]) 09/16/2017   Essential hypertension 01/18/2014   Hyperlipidemia LDL goal <70 01/18/2014   Obesity (BMI 30-39.9) 01/18/2014    Immunization History  Administered Date(s) Administered   Fluad Quad(high Dose 65+) 09/04/2020   Influenza, High Dose Seasonal PF 09/16/2015, 09/14/2017, 08/31/2019   Influenza-Unspecified 12/01/2018   Moderna Sars-Covid-2 Vaccination 02/03/2020, 03/02/2020   Pneumococcal Conjugate-13 09/16/2015   Pneumococcal Polysaccharide-23 01/18/2014   Tdap 01/17/2012   Zoster Recombinat (Shingrix) 11/25/2018, 05/28/2019   Zoster, Live 02/21/2014    Conditions to be addressed/monitored:  Hypertension, Hyperlipidemia, Diabetes, and Osteoarthritis  Care Plan : General Pharmacy (Adult)  Updates made by Germaine Pomfret, [place] since 05/14/2022 12:00 AM     Problem: Hypertension, Hyperlipidemia, Diabetes, and Osteoarthritis   Priority: High     Long-Range Goal: Patient-Specific Goal   Start Date: 04/17/2022  Expected End Date: 04/18/2023  This Visit's Progress: On track  Recent Progress: On track  Priority: High  Note:   Current Barriers:  No barriers noted  Pharmacist Clinical Goal(s):  Patient will maintain control of blood pressure as evidenced by BP less than 140/90  through collaboration with PharmD and provider.   Interventions: 1:1 collaboration with Charyl Dancer, NP regarding development and update of comprehensive plan of care as evidenced by provider attestation and co-signature Inter-disciplinary care team collaboration (see longitudinal plan of care) Comprehensive medication review performed; medication list updated in electronic medical record  Hypertension (BP goal <140/90) -Controlled -Current treatment: Chlorthalidone 25 mg daily  Metoprolol XL 50 mg twice daily  -Medications previously tried: NA  -Current home readings: Does not monitor at home.  -Current dietary habits: Limits added salt, but does enjoy canned soup.  -Denies hypotensive/hypertensive symptoms -Recommended to continue current medication  Hyperlipidemia: (LDL goal < 70) -Uncontrolled -Current treatment: Atorvastatin 10 mg daily  Fenofibrate 54 mg daily  Lovaza 1 g four times daily  -Medications previously tried: NA  -Fearful of statins.  -Recommended to continue current medication  Diabetes (A1c goal <7%) -Controlled -Current medications: Metformin XR 1000 mg nightly  -Medications previously tried: NA  -Current home glucose readings: NA -Denies hypoglycemic/hyperglycemic symptoms -Current meal patterns: eating less rice, does still enjoy brown rice, lentils, corn.  -Breakfast: Low sugar cereal.  -Recommended to continue current medication  Osteoarthritis (Goal: [place]z pain) -Not ideally controlled -Current treatment  Naproxen 220 mg 1-2 times daily  -Medications previously tried: NA -Discussed risks of chronic NSAID use.  -Recommend starting acetaminophen CR  650 mg twice daily as needed -Recommended taking naproxen only as needed for moderate-severe arthritis pain.   Health Maintenance -Not ideally controlled -Medications previously tried: NA  -Current treatment  Aspirin 81 mg daily  Potassium 14m daily Glucosamine Chondroitin 150 daily  Red yeast rice 1,2023mdaily Calcium 600 mg three times weekly  Flax Seed Oil 400 mg 2 capsules  daily -Educated on Herbal supplement research is limited and benefits usually cannot be proven Supplements may interfere with prescription drugs -STOP Red Yeast Rice (risk of myalgias)  -STOP Flax Seed   Patient Goals/Self-Care Activities Patient will:  - check glucose weekly, document, and provide at future appointments check blood pressure weekly, document, and provide at future appointments  Follow Up Plan: Telephone follow up appointment with care management team member scheduled for:  08/13/2022 at 1:00 PM       Medication Assistance: None required.  Patient affirms current coverage meets needs.  Compliance/Adherence/Medication fill history: Care Gaps: Foot Exam  Colonoscopy Exam COVID-19 Vaccine Ophthalmology Exam Tetanus Vaccine HTN: 150/78 on 03/26/2022  Star-Rating Drugs: At[facility]vastatin 10 mg [facility]: [id number]: [phone] pill box? Yes Pt endorses 100% compliance  We discussed: Current pharmacy is preferred with insurance plan and patient is satisfied with pharmacy services Patient decided to: Continue current medication management strategy  Care Plan and Follow Up Patient Decision:  Patient agrees to Care Plan and Follow-up.  Plan: Telephone follow up appointment with care management team member scheduled for:  08/13/2022 at 1:00 PM  AlJunius ArgylePharmD, BCPara MarchCPP Clinical Pharmacist Practitioner  [facility] [id number] [phone]

## 2022-05-20 DIAGNOSIS — I10 Essential (primary) hypertension: Secondary | ICD-10-CM | POA: Diagnosis not present

## 2022-05-20 DIAGNOSIS — E1169 Type 2 diabetes mellitus with other specified complication: Secondary | ICD-10-CM | POA: Diagnosis not present

## 2022-05-20 DIAGNOSIS — Z7984 Long term (current) use of oral hypoglycemic drugs: Secondary | ICD-10-CM | POA: Diagnosis not present

## 2022-05-20 DIAGNOSIS — M1990 Unspecified osteoarthritis, unspecified site: Secondary | ICD-10-CM

## 2022-05-20 DIAGNOSIS — E785 Hyperlipidemia, unspecified: Secondary | ICD-10-CM

## 2022-05-25 NOTE — Progress Notes (Unsigned)
   Established Patient Office Visit  Subjective   Patient ID: Richard Harding, male    DOB: 1946-05-13  Age: 76 y.o. MRN: [id number]  No chief complaint on file.   HPI  Richard Harding is here today to follow-up on diabetes. He was noting his blood sugars to be elevated in the 250s last visit. His metformin was increased to 1,'000mg'$  daily.   {History (Optional):23778}  ROS    Objective:     There were no vitals taken for this visit. {Vitals History (Optional):23777}  Physical Exam   No results found for any visits on 05/26/22.  {Labs (Optional):23779}  The 10-year ASCVD risk score (Arnett DK, et al., 2019) is: 45.5%    Assessment & Plan:   Problem List Items Addressed This Visit   None   No follow-ups on file.    Charyl Dancer, NP

## 2022-05-26 ENCOUNTER — Ambulatory Visit (INDEPENDENT_AMBULATORY_CARE_PROVIDER_SITE_OTHER): Payer: Medicare Other | Admitting: Nurse Practitioner

## 2022-05-26 ENCOUNTER — Encounter: Payer: Self-pay | Admitting: Nurse Practitioner

## 2022-05-26 VITALS — BP 138/70 | HR 81 | Temp 97.2°F | Wt 193.8 lb

## 2022-05-26 DIAGNOSIS — R079 Chest pain, unspecified: Secondary | ICD-10-CM

## 2022-05-26 DIAGNOSIS — E1151 Type 2 diabetes mellitus with diabetic peripheral angiopathy without gangrene: Secondary | ICD-10-CM

## 2022-05-26 HISTORY — DX: Chest pain, unspecified: R07.9

## 2022-05-26 MED ORDER — DAPAGLIFLOZIN PROPANEDIOL 5 MG PO TABS: 5.0000 mg | ORAL_TABLET | Freq: Every day | ORAL | 1 refills | Status: DC

## 2022-05-26 NOTE — Assessment & Plan Note (Signed)
He had an episode of chest pain about a week ago that lasted 30 minutes and then went away. He denies shortness of breath, radiating pain, diaphoresis, and nausea. He has not had pain since. EKG done today shows NSR, heart rate 77 with no changes from prior EKG. Follow-up in 1 month or sooner if pain returns.

## 2022-05-26 NOTE — Patient Instructions (Signed)
It was great to see you!  Start farxiga '5mg'$  daily for your blood sugar. Keep taking metformin 1 tablet twice a day.   Let's follow-up in 1 month, sooner if you have concerns.  If a referral was placed today, you will be contacted for an appointment. Please note that routine referrals can sometimes take up to 3-4 weeks to process. Please call our office if you haven't heard anything after this time frame.  Take care,  Vance Peper, NP

## 2022-05-26 NOTE — Assessment & Plan Note (Signed)
His blood sugars are elevated at home, 150s in the morning fasting and 250s after eating. He is concerned that this is too high and wants to get better control. Discussed that his A1c is well controlled at 6.8% from 2 months ago. With the sugars being elevated, will start farxiga '5mg'$  daily to also provide cardiac protection. Discussed possible side effects. Follow-up in 1 month for repeat labs.

## 2022-05-26 NOTE — Progress Notes (Signed)
EKG interpreted by me on 05/26/22 showed normal sinus rhythm with a heart rate of 77. No changes from prior EKG.

## 2022-06-01 DIAGNOSIS — L2089 Other atopic dermatitis: Secondary | ICD-10-CM | POA: Diagnosis not present

## 2022-06-22 NOTE — Progress Notes (Unsigned)
   Established Patient Office Visit  Subjective   Patient ID: Richard Harding, male    DOB: Oct 22, 1946  Age: 76 y.o. MRN: [id number]  No chief complaint on file.   HPI  Richard Harding is here to follow-up on diabetes. Last visit he was started on farxiga '5mg'$  daily.   {History (Optional):23778}  ROS    Objective:     There were no vitals taken for this visit. {Vitals History (Optional):23777}  Physical Exam   No results found for any visits on 06/25/22.  {Labs (Optional):23779}  The 10-year ASCVD risk score (Arnett DK, et al., 2019) is: 51.7%    Assessment & Plan:   Problem List Items Addressed This Visit   None   No follow-ups on file.    Charyl Dancer, NP

## 2022-06-25 ENCOUNTER — Encounter: Payer: Self-pay | Admitting: Nurse Practitioner

## 2022-06-25 ENCOUNTER — Ambulatory Visit (INDEPENDENT_AMBULATORY_CARE_PROVIDER_SITE_OTHER): Payer: Medicare Other | Admitting: Nurse Practitioner

## 2022-06-25 ENCOUNTER — Other Ambulatory Visit: Payer: Self-pay | Admitting: Nurse Practitioner

## 2022-06-25 VITALS — BP 109/81 | HR 76 | Temp 96.2°F | Wt 189.8 lb

## 2022-06-25 DIAGNOSIS — E785 Hyperlipidemia, unspecified: Secondary | ICD-10-CM | POA: Diagnosis not present

## 2022-06-25 DIAGNOSIS — I10 Essential (primary) hypertension: Secondary | ICD-10-CM | POA: Diagnosis not present

## 2022-06-25 DIAGNOSIS — R5383 Other fatigue: Secondary | ICD-10-CM | POA: Diagnosis not present

## 2022-06-25 DIAGNOSIS — E1151 Type 2 diabetes mellitus with diabetic peripheral angiopathy without gangrene: Secondary | ICD-10-CM | POA: Diagnosis not present

## 2022-06-25 LAB — TESTOSTERONE: Testosterone: 271.91 ng/dL — ABNORMAL LOW (ref 300.00–890.00)

## 2022-06-25 LAB — LIPID PANEL
Cholesterol: 135 mg/dL (ref 0–200)
HDL: 35.1 mg/dL — ABNORMAL LOW (ref >39.00)
NonHDL: 99.95
Total CHOL/HDL Ratio: 4
Triglycerides: 282 mg/dL — ABNORMAL HIGH (ref 0.0–149.0)
VLDL: 56.4 mg/dL — ABNORMAL HIGH (ref 0.0–40.0)

## 2022-06-25 LAB — LDL CHOLESTEROL, DIRECT: Direct LDL: 76 mg/dL

## 2022-06-25 LAB — COMPREHENSIVE METABOLIC PANEL
ALT: 17 U/L (ref 0–53)
AST: 14 U/L (ref 0–37)
Albumin: 4.7 g/dL (ref 3.5–5.2)
Alkaline Phosphatase: 42 U/L (ref 39–117)
BUN: 22 mg/dL (ref 6–23)
CO2: 31 mEq/L (ref 19–32)
Calcium: 10.6 mg/dL — ABNORMAL HIGH (ref 8.4–10.5)
Chloride: 95 mEq/L — ABNORMAL LOW (ref 96–112)
Creatinine, Ser: 1.04 mg/dL (ref 0.40–1.50)
GFR: 70.47 mL/min (ref >60.00)
Glucose, Bld: 130 mg/dL — ABNORMAL HIGH (ref 70–99)
Potassium: 3.6 mEq/L (ref 3.5–5.1)
Sodium: 137 mEq/L (ref 135–145)
Total Bilirubin: 0.5 mg/dL (ref 0.2–1.2)
Total Protein: 7.5 g/dL (ref 6.0–8.3)

## 2022-06-25 LAB — HEMOGLOBIN A1C: Hgb A1c MFr Bld: 6.4 % (ref 4.6–6.5)

## 2022-06-25 NOTE — Assessment & Plan Note (Signed)
Chronic, stable.  BP today 109/81.  Continue chlorthalidone 25 mg daily and metoprolol 50 mg twice a day.  Check CMP today.  Follow-up in 6 months.

## 2022-06-25 NOTE — Assessment & Plan Note (Signed)
Check fasting lipid panel today.

## 2022-06-25 NOTE — Patient Instructions (Signed)
It was great to see you!  [facility] [address], [place], [place] [address] [phone]  Let's follow-up in 3 months, sooner if you have concerns.  If a referral was placed today, you will be contacted for an appointment. Please note that routine referrals can sometimes take up to 3-4 weeks to process. Please call our office if you haven't heard anything after this time frame.  Take care,  Vance Peper, NP

## 2022-06-25 NOTE — Assessment & Plan Note (Signed)
His blood pressures have ranged from the 90s to 290s at home.  He is concerned because of the high readings even after starting the Farxiga 5 mg daily.  We will check CMP, A1c today and adjust dosage based on results.  Discussed eye exam and how he is due for this.  Foot exam normal.  Follow-up in 3 months.

## 2022-06-29 ENCOUNTER — Telehealth: Payer: Self-pay

## 2022-06-29 NOTE — Telephone Encounter (Signed)
Phone note reviewed. Sw, cma

## 2022-06-29 NOTE — Progress Notes (Signed)
Called and informed patient of results and provider instructions. Patient voiced understanding. Sw, cma  Pt wants to know if you are able to prescribed testosterone w/o him having to go to urologist

## 2022-07-14 ENCOUNTER — Other Ambulatory Visit: Payer: Self-pay | Admitting: Nurse Practitioner

## 2022-07-23 DIAGNOSIS — H524 Presbyopia: Secondary | ICD-10-CM | POA: Diagnosis not present

## 2022-07-23 LAB — HM DIABETES EYE EXAM

## 2022-07-28 ENCOUNTER — Encounter: Payer: Self-pay | Admitting: Nurse Practitioner

## 2022-08-12 ENCOUNTER — Telehealth: Payer: Self-pay

## 2022-08-12 NOTE — Progress Notes (Signed)
Chronic Care Management APPOINTMENT REMINDER   Called Le[facility] Khosh Scheirer, No answer, Unable to leave voice message to remind patient of appointment on 08/13/2022 at 1:00 pm via telephone visit with Junius Argyle , [place] Pharmacist Assistant [phone]

## 2022-08-13 ENCOUNTER — Other Ambulatory Visit: Payer: Self-pay | Admitting: Nurse Practitioner

## 2022-08-13 ENCOUNTER — Ambulatory Visit (INDEPENDENT_AMBULATORY_CARE_PROVIDER_SITE_OTHER): Payer: Medicare Other

## 2022-08-13 DIAGNOSIS — E1151 Type 2 diabetes mellitus with diabetic peripheral angiopathy without gangrene: Secondary | ICD-10-CM

## 2022-08-13 DIAGNOSIS — E785 Hyperlipidemia, unspecified: Secondary | ICD-10-CM

## 2022-08-13 NOTE — Progress Notes (Signed)
Chronic Care Management Pharmacy Note  08/13/2022 Name:  Richard Harding MRN:  [id number] DOB:  08/26/1946  Summary: Patient presents for CCM follow-up. Continues to struggle with replacing Dexcom sensors. He has been using naproxen daily for his arthritis.  Recommendations/Changes made from today's visit: -Discussed risks of chronic NSAID use.  -Recommend starting acetaminophen CR 650 mg twice daily as needed -Recommended taking naproxen only as needed for moderate-severe arthritis pain.   Plan: CPP follow-up 6 months  Subjective: Richard Harding is an 76 y.o. year old male who is a primary patient of McElwee, Lauren A, NP.  The CCM team was consulted for assistance with disease management and care coordination needs.    Engaged with patient by telephone for follow up visit in response to provider referral for pharmacy case management and/or care coordination services.   Consent to Services:  The patient was given information about Chronic Care Management services, agreed to services, and gave verbal consent prior to initiation of services.  Please see initial visit note for detailed documentation.   Patient Care Team: Charyl Dancer, NP as PCP - General (Internal Medicine) Clent Jacks, MD as Consulting Physician (Ophthalmology) Germaine Pomfret, [facility] (Pharmacist)  Recent office visits: 06/25/22: Patient presented to Vance Peper, NP for follow-up.  04/24/22: Patient presented to Vance Peper, NP for follow-up.   04/24/22: Patient presented to Vance Peper, NP for follow-up. Metformin 1000 mg daily.   03/26/2022 Vance Peper NP (PCP) stop the cinnamon supplement, start Lotrisone Cream 1-0.05%,  Follow-up in 2 months, Ambulatory referral to Orthopedic Surgery, Ambulatory referral to Dermatology, AMB Referral to [facility]  Recent consult visits: No recent consult visit  Hospital visits: None in previous 6 months   Objective:  Lab  Results  Component Value Date   CREATININE 1.04 06/25/2022   BUN 22 06/25/2022   GFR 70.47 06/25/2022   GFRNONAA >60 04/28/2020   GFRAA >60 04/28/2020   NA 137 06/25/2022   K 3.6 06/25/2022   CALCIUM 10.6 (H) 06/25/2022   CO2 31 06/25/2022   GLUCOSE 130 (H) 06/25/2022    Lab Results  Component Value Date/Time   HGBA1C 6.4 06/25/2022 10:12 AM   HGBA1C 7.1 (A) 03/26/2022 05:18 PM   HGBA1C 7.1 03/26/2022 05:18 PM   HGBA1C 7.1 (A) 03/26/2022 05:18 PM   HGBA1C 7.1 (A) 03/26/2022 05:18 PM   HGBA1C 6.7 (H) 03/26/2022 02:30 PM   HGBA1C 6.2 01/12/2019 11:55 AM   GFR 70.47 06/25/2022 10:12 AM   GFR 69.96 09/04/2020 03:15 PM   MICROALBUR 1.3 02/29/2020 01:56 PM   MICROALBUR 1.0 01/12/2019 12:08 PM    Last diabetic Eye exam:  Lab Results  Component Value Date/Time   HMDIABEYEEXA No Retinopathy 07/23/2022 12:00 AM    Last diabetic Foot exam: No results found for: "HMDIABFOOTEX"   Lab Results  Component Value Date   CHOL 135 06/25/2022   HDL 35.10 (L) 06/25/2022   LDLCALC 76 03/26/2022   LDLDIRECT 76.0 06/25/2022   TRIG 282.0 (H) 06/25/2022   CHOLHDL 4 06/25/2022       Latest Ref Rng & Units 06/25/2022   10:12 AM 03/26/2022    2:30 PM 09/04/2020    3:15 PM  Hepatic Function  Total Protein 6.0 - 8.3 g/dL 7.5  7.2  7.6   Albumin 3.5 - 5.2 g/dL 4.7   4.5   AST 0 - 37 U/L 14  14  18    ALT 0 - 53 U/L 17  17  16   Alk Phosphatase 39 - 117 U/L 42   38   Total Bilirubin 0.2 - 1.2 mg/dL 0.5  0.5  0.5     Lab Results  Component Value Date/Time   TSH 5.39 (H) 03/26/2022 03:00 PM       Latest Ref Rng & Units 03/26/2022    2:30 PM 04/28/2020    7:45 PM 04/27/2020    3:23 AM  CBC  WBC 3.8 - 10.8 Thousand/uL 7.6  10.8  12.9   Hemoglobin 13.2 - 17.1 g/dL 14.0  11.8  12.1   Hematocrit 38.5 - 50.0 % 41.1  34.0  35.6   Platelets 140 - 400 Thousand/uL 269  256  281     No results found for: "VD25OH"  Clinical ASCVD: No  The 10-year ASCVD risk score (Arnett DK, et al., 2019) is:  37.9%   Values used to calculate the score:     Age: 9 years     Sex: Male     Is Non-Hispanic African American: No     Diabetic: Yes     Tobacco smoker: No     Systolic Blood Pressure: 073 mmHg     Is BP treated: Yes     HDL Cholesterol: 35.1 mg/dL     Total Cholesterol: 135 mg/dL       04/07/2022    5:02 PM 09/04/2020    3:12 PM 01/12/2019   11:48 AM  Depression screen PHQ 2/9  Decreased Interest 0 0 0  Down, Depressed, Hopeless 0 0 0  PHQ - 2 Score 0 0 0     Social History   Tobacco Use  Smoking Status Never  Smokeless Tobacco Never   BP Readings from Last 3 Encounters:  06/25/22 109/81  05/26/22 138/70  04/24/22 130/80   Pulse Readings from Last 3 Encounters:  06/25/22 76  05/26/22 81  04/24/22 81   Wt Readings from Last 3 Encounters:  06/25/22 189 lb 12.8 oz (86.1 kg)  05/26/22 193 lb 12.8 oz (87.9 kg)  04/24/22 195 lb 6.4 oz (88.6 kg)   BMI Readings from Last 3 Encounters:  06/25/22 30.63 kg/m  05/26/22 31.28 kg/m  04/24/22 31.54 kg/m    Assessment/Interventions: Review of patient past medical history, allergies, medications, health status, including review of consultants reports, laboratory and other test data, was performed as part of comprehensive evaluation and provision of chronic care management services.   SDOH:  (Social Determinants of Health) assessments and interventions performed: Yes   SDOH Screenings   Alcohol Screen: Low Risk  (04/07/2022)   Alcohol Screen    Last Alcohol Screening Score (AUDIT): 0  Depression (PHQ2-9): Low Risk  (04/07/2022)   Depression (PHQ2-9)    PHQ-2 Score: 0  Financial Resource Strain: Low Risk  (04/17/2022)   Overall Financial Resource Strain (CARDIA)    Difficulty of Paying Living Expenses: Not hard at all  Food Insecurity: No Food Insecurity (04/07/2022)   Hunger Vital Sign    Worried About Running Out of Food in the Last Year: Never true    Ran Out of Food in the Last Year: Never true  Housing: Low Risk   (04/07/2022)   Housing    Last Housing Risk Score: 0  Physical Activity: Insufficiently Active (04/07/2022)   Exercise Vital Sign    Days of Exercise per Week: 7 days    Minutes of Exercise per Session: 20 min  Social Connections: Socially Isolated (04/07/2022)   Social Connection and Isolation Panel [  NHANES]    Frequency of Communication with Friends and Family: More than three times a week    Frequency of Social Gatherings with Friends and Family: More than three times a week    Attends Religious Services: Never    Marine scientist or Organizations: No    Attends Archivist Meetings: Never    Marital Status: Never married  Stress: No Stress Concern Present (04/07/2022)   [place]    Feeling of Stress : Not at all  Tobacco Use: Low Risk  (06/25/2022)   Patient History    Smoking Tobacco Use: Never    Smokeless Tobacco Use: Never    Passive Exposure: Not on file  Transportation Needs: No Transportation Needs (04/17/2022)   PRAPARE - Transportation    Lack of Transportation (Medical): No    Lack of Transportation (Non-Medical): No    CCM Care Plan  Allergies  Allergen Reactions   Cinnamon Flavor Other (See Comments)    Cinnamon chewing gum caused blisters on tongue    Medications Reviewed Today     Reviewed by Charyl Dancer, NP (Nurse Practitioner) on 06/25/22 at [facility] List Status: <None>   Medication Order Taking? Sig Documenting Provider Last Dose Status Informant  Ascorbic Acid (VITAMIN C) 1000 MG tablet [id number] No Take 1,000 mg by mouth at bedtime. [provider] Taking Active Self  aspirin EC 81 MG EC tablet [id number] No Take 1 tablet (81 mg total) by mouth 2 (two) times daily after a meal.  Patient taking differently: Take 81 mg by mouth daily.   Mcarthur Rossetti, MD Taking Active   atorvastatin (LIPITOR) 10 MG tablet [id number] No Take 1 tablet (10 mg total) by  mouth daily. McElwee, Lauren A, NP Taking Active   b complex vitamins capsule [id number]  Take 1 capsule by mouth daily. [provider]  Active   blood glucose meter kit and supplies KIT [id number] No Check blood glucose once daily. E11.9. Lucille Passy, MD Taking Active Self  Calcium Carbonate-Vit D-Min (CALCIUM 1200 PO) [id number] No Take 1 tablet by mouth every other day. Taking 3x per week [provider] Taking Active   chlorthalidone (HYGROTON) 25 MG tablet [id number] No Take 1 tablet (25 mg total) by mouth daily. Charyl Dancer, NP Taking Active   clotrimazole-betamethasone (LOTRISONE) cream [id number] No Apply 1 application. topically daily. Charyl Dancer, NP Taking Active   Continuous Blood Gluc Receiver (DEXCOM G6 RECEIVER) DEVI [id number] No 1 each by Does not apply route daily.  Patient not taking: Reported on 04/07/2022   Charyl Dancer, NP Not Taking Active   Continuous Blood Gluc Sensor (DEXCOM G6 SENSOR) MISC [id number] No 3 each by Does not apply route every 14 (fourteen) days.  Patient not taking: Reported on 04/07/2022   Charyl Dancer, NP Not Taking Active   Continuous Blood Gluc Transmit (DEXCOM G6 TRANSMITTER) MISC [id number]  1 each by Does not apply route continuous. McElwee, Lauren A, NP  Active   dapagliflozin propanediol (FARXIGA) 5 MG TABS tablet [id number]  Take 1 tablet (5 mg total) by mouth daily before breakfast. McElwee, Lauren A, NP  Active   fenofibrate 54 MG tablet [id number] No Take 1 tablet (54 mg total) by mouth daily. McElwee, Lauren A, NP Taking Active   Flaxseed, Linseed, (FLAX SEED OIL PO) [id number] No Take 2 capsules by mouth daily. [provider] Taking Active Self  fluticasone (  CUTIVATE) 0.05 % cream [id number] No Apply topically 2 (two) times daily. Ronnald Nian, DO Taking Active   GLUCOSA-CHONDR-NA CHONDR-MSM PO [id number] No Take 1 capsule by mouth at bedtime. [provider] Taking Active Self  metFORMIN  (GLUCOPHAGE-XR) 500 MG 24 hr tablet [id number] No Take 2 tablets (1,000 mg total) by mouth daily with breakfast. McElwee, Lauren A, NP Taking Active   metoprolol tartrate (LOPRESSOR) 50 MG tablet [id number] No Take 1 tablet (50 mg total) by mouth 2 (two) times daily.  Patient taking differently: Take 100 mg by mouth daily.   McElwee, Lauren A, NP Taking Active   naproxen sodium (ALEVE) 220 MG tablet [id number] No Take 220 mg by mouth daily. [provider] Taking Active   niacin 500 MG tablet [id number] No Take 1 tablet (500 mg total) by mouth daily.  Patient not taking: Reported on 04/07/2022   Lucille Passy, MD Not Taking Active Self  omega-3 acid ethyl esters (LOVAZA) 1 g capsule [id number] No Take 1 capsule (1 g total) by mouth 4 (four) times daily.  Patient taking differently: Take 4 g by mouth daily.   McElwee, Lauren A, NP Taking Active   potassium bicarbonate (K-LYTE) 25 MEQ disintegrating tablet [id number] No Take 99 mEq by mouth daily. [provider] Taking Active   Red Yeast Rice 600 MG CAPS [id number] No Take 1 capsule (600 mg total) by mouth daily. Ann Held, DO Taking Active Self            Patient Active Problem List   Diagnosis Date Noted   Chest pain 05/26/2022   Unilateral primary osteoarthritis, left knee 04/08/2022   Arthritis of left knee 03/26/2022   Rash 03/26/2022   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/26/2020   Arthritis of right knee 04/25/2020   Hypokalemia 01/12/2019   DM (diabetes mellitus) type II, controlled, with peripheral vascular disorder ([place]) 09/16/2017   Essential hypertension 01/18/2014   Hyperlipidemia LDL goal <70 01/18/2014   Obesity (BMI 30-39.9) 01/18/2014    Immunization History  Administered Date(s) Administered   Fluad Quad(high Dose 65+) 09/04/2020   Influenza, High Dose Seasonal PF 09/16/2015, 09/14/2017, 08/31/2019   Influenza-Unspecified 12/01/2018   Moderna Covid-19 Vaccine Bivalent Booster 71yr  & up 05/24/2022   Moderna Sars-Covid-2 Vaccination 02/03/2020, 03/02/2020   Pneumococcal Conjugate-13 09/16/2015   Pneumococcal Polysaccharide-23 01/18/2014   Td 05/24/2022   Tdap 01/17/2012   Zoster Recombinat (Shingrix) 11/25/2018, 05/28/2019   Zoster, Live 02/21/2014    Conditions to be addressed/monitored:  Hypertension, Hyperlipidemia, Diabetes, and Osteoarthritis  There are no care plans that you recently modified to display for this patient.      Medication Assistance: None required.  Patient affirms current coverage meets needs.  Compliance/Adherence/Medication fill history: Care Gaps: Foot Exam  Colonoscopy Exam COVID-19 Vaccine Ophthalmology Exam Tetanus Vaccine HTN: 150/78 on 03/26/2022  Star-Rating Drugs: Atorvastatin 10 mg [facility] 500 mg [facility]  [facility] pharmacy is:  [id number]: [phone]: [phone] Uses pill box? Yes Pt endorses 100% compliance  We discussed: Current pharmacy is preferred with insurance plan and patient is satisfied with pharmacy services Patient decided to: Continue current medication management strategy  Care Plan and Follow Up Patient Decision:  Patient agrees to Care Plan and Follow-up.  Plan: Telephone follow up appointment with care management team member scheduled for:  02/11/2023 at 3:00 PM  Junius Argyle, PharmD, Para March, CPP Clinical Pharmacist Practitioner  [facility] at [facility]  [phone]

## 2022-08-20 DIAGNOSIS — E785 Hyperlipidemia, unspecified: Secondary | ICD-10-CM

## 2022-08-20 DIAGNOSIS — E1151 Type 2 diabetes mellitus with diabetic peripheral angiopathy without gangrene: Secondary | ICD-10-CM

## 2022-08-20 NOTE — Patient Instructions (Signed)
Visit Information It was great speaking with you today!  Please let me know if you have any questions about our visit.   Goals Addressed             This Visit's Progress    Track and Manage My Blood Pressure-Hypertension   On track    Timeframe:  Long-Range Goal Priority:  High Start Date: 04/17/22                            Expected End Date: 04/18/23                      Follow Up within 90 days   - check blood pressure weekly    Why is this important?   You won't feel high blood pressure, but it can still hurt your blood vessels.  High blood pressure can cause heart or kidney problems. It can also cause a stroke.  Making lifestyle changes like losing a little weight or eating less salt will help.  Checking your blood pressure at home and at different times of the day can help to control blood pressure.  If the doctor prescribes medicine remember to take it the way the doctor ordered.  Call the office if you cannot afford the medicine or if there are questions about it.     Notes:         Patient Care Plan: General Pharmacy (Adult)     Problem Identified: Hypertension, Hyperlipidemia, Diabetes, and Osteoarthritis   Priority: High     Long-Range Goal: Patient-Specific Goal   Start Date: 04/17/2022  Expected End Date: 04/18/2023  This Visit's Progress: On track  Recent Progress: On track  Priority: High  Note:   Current Barriers:  No barriers noted  Pharmacist Clinical Goal(s):  Patient will maintain control of blood pressure as evidenced by BP less than 140/90  through collaboration with PharmD and provider.   Interventions: 1:1 collaboration with Charyl Dancer, NP regarding development and update of comprehensive plan of care as evidenced by provider attestation and co-signature Inter-disciplinary care team collaboration (see longitudinal plan of care) Comprehensive medication review performed; medication list updated in electronic medical  record  Hypertension (BP goal <140/90) -Controlled -Current treatment: Chlorthalidone 25 mg daily  Metoprolol XL 50 mg twice daily  -Medications previously tried: NA  -Current home readings: Does not monitor at home.  -Current dietary habits: Limits added salt, but does enjoy canned soup.  -Denies hypotensive/hypertensive symptoms -Recommended to continue current medication  Hyperlipidemia: (LDL goal < 70) -Uncontrolled -Current treatment: Atorvastatin 10 mg daily  Fenofibrate 54 mg daily  Lovaza 4g daily  -Medications previously tried: NA  -Fearful of statins.  -Recommended to continue current medication  Diabetes (A1c goal <7%) -Controlled -Current medications: Farxiga 5 mg daily  Metformin XR 1000 mg nightly  -Medications previously tried: NA  -Current home glucose readings:  -Denies hypoglycemic/hyperglycemic symptoms -Current meal patterns: eating less rice, does still enjoy brown rice, lentils, corn.  -Breakfast: Low sugar cereal.  -Recommended to continue current medication  Osteoarthritis (Goal: [place] pain) -Not ideally controlled -Current treatment  Naproxen 220 mg 1-2 times daily  -Medications previously tried: NA -Discussed risks of chronic NSAID use.  -Recommend starting acetaminophen CR 650 mg twice daily as needed -Recommended taking naproxen only as needed for moderate-severe arthritis pain.   Health Maintenance -Not ideally controlled -Medications previously tried: NA  -Current treatment  Aspirin 81 mg  daily Glucosamine Chondroitin 150 daily  Calcium 600 mg three times weekly  Flax Seed Oil 400 mg 2 capsules daily -Educated on Herbal supplement research is limited and benefits usually cannot be proven; supplements may interfere with prescription drugs  Patient Goals/Self-Care Activities Patient will:  - check glucose weekly, document, and provide at future appointments check blood pressure weekly, document, and provide at future  appointments  Follow Up Plan: Telephone follow up appointment with care management team member scheduled for:  02/11/2023 at 3:00 PM    Patient agreed to services and verbal consent obtained.   The patient verbalized understanding of instructions, educational materials, and care plan provided today and DECLINED offer to receive copy of patient instructions, educational materials, and care plan.   Junius Argyle, PharmD, Para March, CPP Clinical Pharmacist Practitioner  [facility] at [facility]  [phone]

## 2022-08-21 ENCOUNTER — Telehealth: Payer: Self-pay | Admitting: Nurse Practitioner

## 2022-08-21 MED ORDER — DAPAGLIFLOZIN PROPANEDIOL 5 MG PO TABS: 5.0000 mg | ORAL_TABLET | Freq: Every day | ORAL | 3 refills | Status: DC

## 2022-08-21 NOTE — Telephone Encounter (Signed)
Requested medication has been approved and sent to patient's preferred pharmacy. Please follow-up with pharmacy to check for additional refills.   

## 2022-08-21 NOTE — Telephone Encounter (Signed)
Caller Name: Rimas Gilham Call back phone #: [phone]   MEDICATION(S):  Daoagliflozin Propanediol '5mg'$   Has the patient contacted their pharmacy (YES/NO)? yes What did pharmacy advise? 90 day supply with 3 refills   Preferred Pharmacy:  [facility] [place] Phone:  [phone]  Fax:  [phone]   ~~~Please advise patient/caregiver to allow 2-3 business days to process RX refills.

## 2022-09-11 ENCOUNTER — Telehealth: Payer: Self-pay | Admitting: Orthopaedic Surgery

## 2022-09-11 NOTE — Progress Notes (Signed)
Sent message, via epic in basket, requesting orders in epic from surgeon.  

## 2022-09-11 NOTE — Telephone Encounter (Signed)
Patient's friend Danae Orleans called asked if patient will have an overnight stay in the hospital because he will not have anyone at the home until the day after surgery. Izora Gala asked if patient can stay over night at the hospital or  a short in rehab.   The number to contact Izora Gala is [phone]

## 2022-09-11 NOTE — Telephone Encounter (Signed)
Thank you :)

## 2022-09-11 NOTE — Telephone Encounter (Signed)
He will spend at least one night right?

## 2022-09-11 NOTE — Telephone Encounter (Signed)
Can you please contact pt's friend. She is going to be his caregiver and has a ton of questions about the hospital and medical equipment.

## 2022-09-11 NOTE — Telephone Encounter (Signed)
Called and advised.

## 2022-09-14 ENCOUNTER — Telehealth: Payer: Self-pay | Admitting: *Deleted

## 2022-09-14 NOTE — Patient Instructions (Signed)
SURGICAL WAITING ROOM VISITATION Patients having surgery or a procedure may have no more than 2 support people in the waiting area - these visitors may rotate.   Children under the age of 78 must have an adult with them who is not the patient. If the patient needs to stay at the hospital during part of their recovery, the visitor guidelines for inpatient rooms apply. Pre-op nurse will coordinate an appropriate time for 1 support person to accompany patient in pre-op.  This support person may not rotate.    Please refer to the [facility] website for the visitor guidelines for Inpatients (after your surgery is over and you are in a regular room).       Your procedure is scheduled on:  09/25/2022    Report to [facility] Main Entrance    Report to admitting at   0730AM   Call this number if you have problems the morning of surgery [phone]   Do not eat food :After Midnight.   After Midnight you may have the following liquids until __ 0700____ Am DAY OF SURGERY  Water Non-Citrus Juices (without pulp, NO RED) Carbonated Beverages Black Coffee (NO MILK/CREAM OR CREAMERS, sugar ok)  Clear Tea (NO MILK/CREAM OR CREAMERS, sugar ok) regular and decaf                             Plain Jell-O (NO RED)                                           Fruit ices (not with fruit pulp, NO RED)                                     Popsicles (NO RED)                                                               Sports drinks like Gatorade (NO RED)                            If you have questions, please contact your surgeon's office.        Oral Hygiene is also important to reduce your risk of infection.                                    Remember - BRUSH YOUR TEETH THE MORNING OF SURGERY WITH YOUR REGULAR TOOTHPASTE   Do NOT smoke after Midnight   Take these medicines the morning of surgery with A SIP OF WATER:  none     DO NOT TAKE ANY ORAL DIABETIC MEDICATIONS DAY OF YOUR  SURGERY  Bring CPAP mask and tubing day of surgery.                              You may not have any metal on your body including hair pins, jewelry,  and body piercing             Do not wear make-up, lotions, powders, perfumes/cologne, or deodorant  Do not wear nail polish including gel and S&S, artificial/acrylic nails, or any other type of covering on natural nails including finger and toenails. If you have artificial nails, gel coating, etc. that needs to be removed by a nail salon please have this removed prior to surgery or surgery may need to be canceled/ delayed if the surgeon/ anesthesia feels like they are unable to be safely monitored.   Do not shave  48 hours prior to surgery.               Men may shave face and neck.   Do not bring valuables to the hospital. [place].   Contacts, dentures or bridgework may not be worn into surgery.   Bring small overnight bag day of surgery.   DO NOT [place]. PHARMACY WILL DISPENSE MEDICATIONS LISTED ON YOUR MEDICATION LIST TO YOU DURING YOUR ADMISSION [place]!    Patients discharged on the day of surgery will not be allowed to drive home.  Someone NEEDS to stay with you for the first 24 hours after anesthesia.   Special Instructions: Bring a copy of your healthcare power of attorney and living will documents the day of surgery if you haven't scanned them before.              Please read over the following fact sheets you were given: IF [place] [phone]   If you received a COVID test during your pre-op visit  it is requested that you wear a mask when out in public, stay away from anyone that may not be feeling well and notify your surgeon if you develop symptoms. If you test positive for Covid or have been in contact with anyone that has tested positive in the last 10 days please notify  you surgeon.     [place] - Preparing for Surgery Before surgery, you can play an important role.  Because skin is not sterile, your skin needs to be as free of germs as possible.  You can reduce the number of germs on your skin by washing with CHG (chlorahexidine gluconate) soap before surgery.  CHG is an antiseptic cleaner which kills germs and bonds with the skin to continue killing germs even after washing. Please DO NOT use if you have an allergy to CHG or antibacterial soaps.  If your skin becomes reddened/irritated stop using the CHG and inform your nurse when you arrive at Short Stay. Do not shave (including legs and underarms) for at least 48 hours prior to the first CHG shower.  You may shave your face/neck. Please follow these instructions carefully:  1.  Shower with CHG Soap the night before surgery and the  morning of Surgery.  2.  If you choose to wash your hair, wash your hair first as usual with your  normal  shampoo.  3.  After you shampoo, rinse your hair and body thoroughly to remove the  shampoo.                           4.  Use CHG as you would any other liquid soap.  You  can apply chg directly  to the skin and wash                       Gently with a scrungie or clean washcloth.  5.  Apply the CHG Soap to your body ONLY FROM THE NECK DOWN.   Do not use on face/ open                           Wound or open sores. Avoid contact with eyes, ears mouth and genitals (private parts).                       Wash face,  Genitals (private parts) with your normal soap.             6.  Wash thoroughly, paying special attention to the area where your surgery  will be performed.  7.  Thoroughly rinse your body with warm water from the neck down.  8.  DO NOT shower/wash with your normal soap after using and rinsing off  the CHG Soap.                9.  Pat yourself dry with a clean towel.            10.  Wear clean pajamas.            11.  Place clean sheets on your bed the night of your  first shower and do not  sleep with pets. Day of Surgery : Do not apply any lotions/deodorants the morning of surgery.  Please wear clean clothes to the hospital/surgery center.  FAILURE TO FOLLOW THESE INSTRUCTIONS MAY RESULT IN THE CANCELLATION OF YOUR SURGERY PATIENT SIGNATURE_________________________________  NURSE SIGNATURE__________________________________  ________________________________________________________________________

## 2022-09-14 NOTE — Telephone Encounter (Signed)
[facility] RNCM call to patient's friend, Izora Gala, who will be coming into town prior to his discharge to stay with patient at his home for about the first 2 weeks after surgery. Reviewed all post-op care instructions, home health therapy, DME needed and answered general questions. She is listed as friend on chart and no health information about patient provided to her, just general questions about post surgical care of a typical total joint replacement patient.

## 2022-09-14 NOTE — Progress Notes (Signed)
Anesthesia Review:  PCP: Cardiologist : Chest x-ray : EKG : Echo : Stress test: Cardiac Cath :  Activity level:  Sleep Study/ CPAP : Fasting Blood Sugar :      / Checks Blood Sugar -- times a day:   Blood Thinner/ Instructions /Last Dose: ASA / Instructions/ Last Dose :   81 mg Aspirin DM- type  [id number]

## 2022-09-15 ENCOUNTER — Other Ambulatory Visit: Payer: Self-pay | Admitting: Orthopaedic Surgery

## 2022-09-15 ENCOUNTER — Encounter (HOSPITAL_COMMUNITY): Payer: Self-pay

## 2022-09-15 ENCOUNTER — Encounter (HOSPITAL_COMMUNITY)
Admission: RE | Admit: 2022-09-15 | Discharge: 2022-09-15 | Disposition: A | Discharge status: Home / Self Care | Payer: Medicare Other | Source: Ambulatory Visit | Attending: Orthopaedic Surgery | Admitting: Orthopaedic Surgery

## 2022-09-15 ENCOUNTER — Other Ambulatory Visit: Payer: Self-pay

## 2022-09-15 VITALS — BP 138/65 | HR 82 | Temp 98.5°F | Resp 16 | Ht 68.0 in | Wt 180.0 lb

## 2022-09-15 DIAGNOSIS — E119 Type 2 diabetes mellitus without complications: Secondary | ICD-10-CM | POA: Diagnosis not present

## 2022-09-15 DIAGNOSIS — Z01818 Encounter for other preprocedural examination: Secondary | ICD-10-CM | POA: Diagnosis not present

## 2022-09-15 LAB — CBC
HCT: 46.3 % (ref 39.0–52.0)
Hemoglobin: 15.5 g/dL (ref 13.0–17.0)
MCH: 28.5 pg (ref 26.0–34.0)
MCHC: 33.5 g/dL (ref 30.0–36.0)
MCV: 85.3 fL (ref 80.0–100.0)
Platelets: 303 10*3/uL (ref 150–400)
RBC: 5.43 MIL/uL (ref 4.22–5.81)
RDW: 12.8 % (ref 11.5–15.5)
WBC: 6.5 10*3/uL (ref 4.0–10.5)
nRBC: 0 % (ref 0.0–0.2)

## 2022-09-15 LAB — BASIC METABOLIC PANEL
Anion gap: 9 (ref 5–15)
BUN: 19 mg/dL (ref 8–23)
CO2: 28 mmol/L (ref 22–32)
Calcium: 9.7 mg/dL (ref 8.9–10.3)
Chloride: 100 mmol/L (ref 98–111)
Creatinine, Ser: 0.91 mg/dL (ref 0.61–1.24)
GFR, Estimated: >60 mL/min (ref >60)
Glucose, Bld: 221 mg/dL — ABNORMAL HIGH (ref 70–99)
Potassium: 2.9 mmol/L — ABNORMAL LOW (ref 3.5–5.1)
Sodium: 137 mmol/L (ref 135–145)

## 2022-09-15 LAB — SURGICAL PCR SCREEN
MRSA, PCR: NEGATIVE
Staphylococcus aureus: NEGATIVE

## 2022-09-15 LAB — HEMOGLOBIN A1C
Hgb A1c MFr Bld: 6.1 % — ABNORMAL HIGH (ref 4.8–5.6)
Mean Plasma Glucose: 128.37 mg/dL

## 2022-09-15 LAB — GLUCOSE, CAPILLARY: Glucose-Capillary: 218 mg/dL — ABNORMAL HIGH (ref 70–99)

## 2022-09-15 MED ORDER — POTASSIUM CHLORIDE CRYS ER 20 MEQ PO TBCR: 20.0000 meq | EXTENDED_RELEASE_TABLET | Freq: Two times a day (BID) | ORAL | 0 refills | Status: DC

## 2022-09-15 NOTE — Progress Notes (Signed)
Called and advised.

## 2022-09-15 NOTE — Progress Notes (Signed)
Called and advised pt. He stated understanding. He asked when is does he stop his aspirin. Please advise

## 2022-09-16 ENCOUNTER — Other Ambulatory Visit (HOSPITAL_COMMUNITY): Payer: Medicare Other

## 2022-09-16 ENCOUNTER — Other Ambulatory Visit: Payer: Self-pay | Admitting: Physician Assistant

## 2022-09-17 ENCOUNTER — Other Ambulatory Visit (HOSPITAL_COMMUNITY): Payer: Medicare Other

## 2022-09-24 NOTE — H&P (Signed)
TOTAL KNEE ADMISSION H&P  Patient is being admitted for left total knee arthroplasty.  Subjective:  Chief Complaint:left knee pain.  HPI: Richard Harding, 76 y.o. male, has a history of pain and functional disability in the left knee due to arthritis and has failed non-surgical conservative treatments for greater than 12 weeks to includeNSAID's and/or analgesics, corticosteriod injections, viscosupplementation injections, flexibility and strengthening excercises, use of assistive devices, weight reduction as appropriate, and activity modification.  Onset of symptoms was gradual, starting 2 years ago with gradually worsening course since that time. The patient noted no past surgery on the left knee(s).  Patient currently rates pain in the left knee(s) at 10 out of 10 with activity. Patient has night pain, worsening of pain with activity and weight bearing, pain that interferes with activities of daily living, pain with passive range of motion, crepitus, and joint swelling.  Patient has evidence of subchondral sclerosis, periarticular osteophytes, and joint space narrowing by imaging studies. There is no active infection.  Patient Active Problem List   Diagnosis Date Noted   Chest pain 05/26/2022   Unilateral primary osteoarthritis, left knee 04/08/2022   Arthritis of left knee 03/26/2022   Rash 03/26/2022   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/26/2020   Hypokalemia 01/12/2019   DM (diabetes mellitus) type II, controlled, with peripheral vascular disorder ([place]) 09/16/2017   Essential hypertension 01/18/2014   Hyperlipidemia LDL goal <70 01/18/2014   Obesity (BMI 30-39.9) 01/18/2014   Past Medical History:  Diagnosis Date   Arthritis    Arthritis of right knee 04/25/2020   Carpal tunnel syndrome, bilateral    Chickenpox    Diabetes mellitus without complication ([place])    Hyperlipemia    Hypertension    RBBB 04/17/2020   Noted on EKG    Past Surgical History:  Procedure  Laterality Date   BILATERAL CARPAL TUNNEL RELEASE     CERVICAL FUSION     COLONOSCOPY  2011   TOTAL KNEE ARTHROPLASTY Right 04/26/2020   Procedure: RIGHT TOTAL KNEE ARTHROPLASTY;  Surgeon: Mcarthur Rossetti, MD;  Location: [facility];  Service: Orthopedics;  Laterality: Right;    No current facility-administered medications for this encounter.   Current Outpatient Medications  Medication Sig Dispense Refill Last Dose   Ascorbic Acid (VITAMIN C) 1000 MG tablet Take 1,000 mg by mouth at bedtime.      aspirin EC 81 MG EC tablet Take 1 tablet (81 mg total) by mouth 2 (two) times daily after a meal. (Patient taking differently: Take 81 mg by mouth daily.) 30 tablet 0    atorvastatin (LIPITOR) 10 MG tablet Take 1 tablet (10 mg total) by mouth daily. 90 tablet 3    b complex vitamins capsule Take 1 capsule by mouth daily.      Calcium Carbonate-Vit D-Min (CALCIUM 1200 PO) Take 1 tablet by mouth 3 (three) times a week.      chlorthalidone (HYGROTON) 25 MG tablet Take 1 tablet (25 mg total) by mouth daily. 90 tablet 3    dapagliflozin propanediol (FARXIGA) 5 MG TABS tablet Take 1 tablet (5 mg total) by mouth daily before breakfast. 90 tablet 3    fenofibrate 54 MG tablet Take 1 tablet (54 mg total) by mouth daily. 90 tablet 3    GLUCOSA-CHONDR-NA CHONDR-MSM PO Take 1 capsule by mouth at bedtime.      metFORMIN (GLUCOPHAGE-XR) 500 MG 24 hr tablet Take 2 tablets (1,000 mg total) by mouth daily with breakfast. (Patient taking differently:  Take 500 mg by mouth 2 (two) times daily with a meal.) 180 tablet 1    metoprolol tartrate (LOPRESSOR) 50 MG tablet Take 1 tablet (50 mg total) by mouth 2 (two) times daily. (Patient taking differently: Take 100 mg by mouth at bedtime.) 180 tablet 3    Multiple Vitamin (MULTIVITAMIN WITH MINERALS) TABS tablet Take 1 tablet by mouth daily.      naproxen sodium (ALEVE) 220 MG tablet Take 220 mg by mouth daily as needed (pain).      omega-3 acid ethyl esters (LOVAZA) 1 g  capsule Take 1 capsule (1 g total) by mouth 4 (four) times daily. (Patient taking differently: Take 4 g by mouth at bedtime.) 360 capsule 3    polyvinyl alcohol (LIQUIFILM TEARS) 1.4 % ophthalmic solution Place 1 drop into both eyes as needed for dry eyes.      Potassium Bicarbonate 99 MG CAPS Take 99 mg by mouth daily.      Prenatal Vit-Fe Fumarate-FA (PRENATAL VITAMIN PO) Take 1 tablet by mouth daily.      Red Yeast Rice 600 MG CAPS Take 1 capsule (600 mg total) by mouth daily.      triamcinolone ointment (KENALOG) 0.1 % Apply 1 Application topically 2 (two) times daily as needed for rash.      blood glucose meter kit and supplies KIT Check blood glucose once daily. E11.9. 1 each 0    clotrimazole-betamethasone (LOTRISONE) cream Apply 1 application. topically daily. (Patient not taking: Reported on 09/10/2022) 45 g 2 Not Taking   Continuous Blood Gluc Receiver ([place]) DEVI 1 each by Does not apply route daily. (Patient not taking: Reported on 04/07/2022) 1 each 0    Continuous Blood Gluc Sensor (DEXCOM G6 SENSOR) MISC USE AS DIRECTED 3 each 0    Continuous Blood Gluc Transmit (DEXCOM G6 TRANSMITTER) MISC 1 each by Does not apply route continuous. 1 each 1    Flaxseed, Linseed, (FLAX SEED OIL PO) Take 2 capsules by mouth daily. (Patient not taking: Reported on 09/10/2022)   Not Taking   fluticasone (CUTIVATE) 0.05 % cream Apply topically 2 (two) times daily. (Patient not taking: Reported on 09/10/2022) 60 g 2 Not Taking   niacin 500 MG tablet Take 1 tablet (500 mg total) by mouth daily. (Patient not taking: Reported on 04/07/2022) 90 tablet 3    potassium chloride SA (KLOR-CON M) 20 MEQ tablet Take 1 tablet (20 mEq total) by mouth 2 (two) times daily. 10 tablet 0    Allergies  Allergen Reactions   Cinnamon Flavor Other (See Comments)    Cinnamon chewing gum caused blisters on tongue    Social History   Tobacco Use   Smoking status: Never   Smokeless tobacco: Never  Substance Use  Topics   Alcohol use: No    No family history on file.   Review of Systems  Musculoskeletal:  Positive for gait problem and joint swelling.  All other systems reviewed and are negative.   Objective:  Physical Exam Vitals reviewed.  Constitutional:      Appearance: Normal appearance.  HENT:     Head: Normocephalic and atraumatic.  Eyes:     Extraocular Movements: Extraocular movements intact.     Pupils: Pupils are equal, round, and reactive to light.  Cardiovascular:     Rate and Rhythm: Normal rate and regular rhythm.  Pulmonary:     Effort: Pulmonary effort is normal.     Breath sounds: Normal breath sounds.  Abdominal:     Palpations: Abdomen is soft.  Musculoskeletal:     Cervical back: Normal range of motion and neck supple.     Left knee: Effusion, bony tenderness and crepitus present. Decreased range of motion. Tenderness present over the medial joint line, lateral joint line and patellar tendon. Abnormal alignment and abnormal meniscus.  Neurological:     Mental Status: He is alert and oriented to person, place, and time.  Psychiatric:        Behavior: Behavior normal.     Vital signs in last 24 hours:    Labs:   Estimated body mass index is 27.37 kg/m as calculated from the following:   Height as of 09/15/22: 5' 8"  (1.727 m).   Weight as of 09/15/22: 81.6 kg.   Imaging Review Plain radiographs demonstrate severe degenerative joint disease of the left knee(s). The overall alignment ismild varus. The bone quality appears to be excellent for age and reported activity level.      Assessment/Plan:  End stage arthritis, left knee   The patient history, physical examination, clinical judgment of the provider and imaging studies are consistent with end stage degenerative joint disease of the left knee(s) and total knee arthroplasty is deemed medically necessary. The treatment options including medical management, injection therapy arthroscopy and  arthroplasty were discussed at length. The risks and benefits of total knee arthroplasty were presented and reviewed. The risks due to aseptic loosening, infection, stiffness, patella tracking problems, thromboembolic complications and other imponderables were discussed. The patient acknowledged the explanation, agreed to proceed with the plan and consent was signed. Patient is being admitted for inpatient treatment for surgery, pain control, PT, OT, prophylactic antibiotics, VTE prophylaxis, progressive ambulation and ADL's and discharge planning. The patient is planning to be discharged home with home health services

## 2022-09-25 ENCOUNTER — Encounter (HOSPITAL_COMMUNITY): Payer: Self-pay | Admitting: Orthopaedic Surgery

## 2022-09-25 ENCOUNTER — Ambulatory Visit (HOSPITAL_COMMUNITY): Payer: Medicare Other | Admitting: Physician Assistant

## 2022-09-25 ENCOUNTER — Other Ambulatory Visit: Payer: Self-pay

## 2022-09-25 ENCOUNTER — Observation Stay (HOSPITAL_COMMUNITY): Payer: Medicare Other

## 2022-09-25 ENCOUNTER — Observation Stay (HOSPITAL_COMMUNITY)
Admission: RE | Admit: 2022-09-25 | Discharge: 2022-09-26 | Disposition: A | Discharge status: Home Health | Payer: Medicare Other | Source: Ambulatory Visit | Attending: Orthopaedic Surgery | Admitting: Orthopaedic Surgery

## 2022-09-25 ENCOUNTER — Ambulatory Visit (HOSPITAL_BASED_OUTPATIENT_CLINIC_OR_DEPARTMENT_OTHER): Payer: Medicare Other | Admitting: Anesthesiology

## 2022-09-25 ENCOUNTER — Encounter (HOSPITAL_COMMUNITY)
Admission: RE | Disposition: A | Discharge status: Home Health | Payer: Self-pay | Source: Ambulatory Visit | Attending: Orthopaedic Surgery

## 2022-09-25 DIAGNOSIS — M1712 Unilateral primary osteoarthritis, left knee: Secondary | ICD-10-CM | POA: Diagnosis not present

## 2022-09-25 DIAGNOSIS — Z7984 Long term (current) use of oral hypoglycemic drugs: Secondary | ICD-10-CM | POA: Insufficient documentation

## 2022-09-25 DIAGNOSIS — I10 Essential (primary) hypertension: Secondary | ICD-10-CM | POA: Diagnosis not present

## 2022-09-25 DIAGNOSIS — Z96652 Presence of left artificial knee joint: Secondary | ICD-10-CM

## 2022-09-25 DIAGNOSIS — Z79899 Other long term (current) drug therapy: Secondary | ICD-10-CM | POA: Insufficient documentation

## 2022-09-25 DIAGNOSIS — Z96651 Presence of right artificial knee joint: Secondary | ICD-10-CM | POA: Diagnosis not present

## 2022-09-25 DIAGNOSIS — E119 Type 2 diabetes mellitus without complications: Secondary | ICD-10-CM | POA: Insufficient documentation

## 2022-09-25 DIAGNOSIS — Z7982 Long term (current) use of aspirin: Secondary | ICD-10-CM | POA: Diagnosis not present

## 2022-09-25 DIAGNOSIS — Z01818 Encounter for other preprocedural examination: Secondary | ICD-10-CM

## 2022-09-25 DIAGNOSIS — Z471 Aftercare following joint replacement surgery: Secondary | ICD-10-CM | POA: Diagnosis not present

## 2022-09-25 HISTORY — PX: TOTAL KNEE ARTHROPLASTY: SHX125

## 2022-09-25 HISTORY — DX: Presence of left artificial knee joint: Z96.652

## 2022-09-25 LAB — GLUCOSE, CAPILLARY
Glucose-Capillary: 122 mg/dL — ABNORMAL HIGH (ref 70–99)
Glucose-Capillary: 149 mg/dL — ABNORMAL HIGH (ref 70–99)
Glucose-Capillary: 211 mg/dL — ABNORMAL HIGH (ref 70–99)
Glucose-Capillary: 221 mg/dL — ABNORMAL HIGH (ref 70–99)

## 2022-09-25 LAB — POTASSIUM: Potassium: 3 mmol/L — ABNORMAL LOW (ref 3.5–5.1)

## 2022-09-25 SURGERY — ARTHROPLASTY, KNEE, TOTAL
Anesthesia: Monitor Anesthesia Care | Site: Knee | Laterality: Left

## 2022-09-25 MED ORDER — 0.9 % SODIUM CHLORIDE (POUR BTL) OPTIME
TOPICAL | Status: DC | PRN
  Administered 2022-09-25: 1000 mL

## 2022-09-25 MED ORDER — HYDROMORPHONE HCL 1 MG/ML IJ SOLN: 0.5000 mg | INTRAMUSCULAR | Status: DC | PRN

## 2022-09-25 MED ORDER — CEFAZOLIN SODIUM-DEXTROSE 1-4 GM/50ML-% IV SOLN: 1.0000 g | Freq: Four times a day (QID) | INTRAVENOUS | Status: DC

## 2022-09-25 MED ORDER — FENOFIBRATE 54 MG PO TABS
54.0000 mg | ORAL_TABLET | Freq: Every day | ORAL | Status: DC
  Administered 2022-09-26: 54 mg via ORAL
  Filled 2022-09-25: qty 1

## 2022-09-25 MED ORDER — PROPOFOL 500 MG/50ML IV EMUL
INTRAVENOUS | Status: DC | PRN
  Administered 2022-09-25: 100 ug/kg/min via INTRAVENOUS

## 2022-09-25 MED ORDER — ATORVASTATIN CALCIUM 10 MG PO TABS
10.0000 mg | ORAL_TABLET | Freq: Every day | ORAL | Status: DC
  Administered 2022-09-26: 10 mg via ORAL
  Filled 2022-09-25: qty 1

## 2022-09-25 MED ORDER — PANTOPRAZOLE SODIUM 40 MG PO TBEC
40.0000 mg | DELAYED_RELEASE_TABLET | Freq: Every day | ORAL | Status: DC
  Administered 2022-09-26: 40 mg via ORAL
  Filled 2022-09-25: qty 1

## 2022-09-25 MED ORDER — METOCLOPRAMIDE HCL 5 MG PO TABS: 5.0000 mg | ORAL_TABLET | Freq: Three times a day (TID) | ORAL | Status: DC | PRN

## 2022-09-25 MED ORDER — ACETAMINOPHEN 325 MG PO TABS: 325.0000 mg | ORAL_TABLET | ORAL | Status: DC | PRN

## 2022-09-25 MED ORDER — ASPIRIN 81 MG PO CHEW
81.0000 mg | CHEWABLE_TABLET | Freq: Two times a day (BID) | ORAL | Status: DC
  Administered 2022-09-25 – 2022-09-26 (×2): 81 mg via ORAL
  Filled 2022-09-25 (×2): qty 1

## 2022-09-25 MED ORDER — METOPROLOL TARTRATE 50 MG PO TABS
100.0000 mg | ORAL_TABLET | Freq: Every day | ORAL | Status: DC
  Administered 2022-09-25: 100 mg via ORAL
  Filled 2022-09-25: qty 2

## 2022-09-25 MED ORDER — ACETAMINOPHEN 160 MG/5ML PO SOLN: 325.0000 mg | ORAL | Status: DC | PRN

## 2022-09-25 MED ORDER — KETOROLAC TROMETHAMINE 15 MG/ML IJ SOLN
7.5000 mg | Freq: Four times a day (QID) | INTRAMUSCULAR | Status: AC
  Administered 2022-09-25 – 2022-09-26 (×3): 7.5 mg via INTRAVENOUS
  Filled 2022-09-25 (×3): qty 1

## 2022-09-25 MED ORDER — METHOCARBAMOL 500 MG IVPB - SIMPLE MED: 500.0000 mg | Freq: Four times a day (QID) | INTRAVENOUS | Status: DC | PRN

## 2022-09-25 MED ORDER — DAPAGLIFLOZIN PROPANEDIOL 5 MG PO TABS
5.0000 mg | ORAL_TABLET | Freq: Every day | ORAL | Status: DC
  Administered 2022-09-26: 5 mg via ORAL
  Filled 2022-09-25: qty 1

## 2022-09-25 MED ORDER — OXYCODONE HCL 5 MG PO TABS
5.0000 mg | ORAL_TABLET | ORAL | Status: DC | PRN
  Administered 2022-09-25 – 2022-09-26 (×3): 10 mg via ORAL
  Administered 2022-09-26: 5 mg via ORAL
  Filled 2022-09-25 (×2): qty 2
  Filled 2022-09-25: qty 1
  Filled 2022-09-25: qty 2

## 2022-09-25 MED ORDER — LIDOCAINE HCL (PF) 2 % IJ SOLN
INTRAMUSCULAR | Status: AC
  Filled 2022-09-25: qty 5

## 2022-09-25 MED ORDER — SODIUM CHLORIDE 0.9 % IV SOLN: INTRAVENOUS | Status: DC

## 2022-09-25 MED ORDER — BUPIVACAINE-EPINEPHRINE 0.25% -1:200000 IJ SOLN
INTRAMUSCULAR | Status: DC | PRN
  Administered 2022-09-25: 30 mL

## 2022-09-25 MED ORDER — ADULT MULTIVITAMIN W/MINERALS CH
1.0000 | ORAL_TABLET | Freq: Every day | ORAL | Status: DC
  Administered 2022-09-26: 1 via ORAL
  Filled 2022-09-25: qty 1

## 2022-09-25 MED ORDER — TRANEXAMIC ACID-NACL 1000-0.7 MG/100ML-% IV SOLN
1000.0000 mg | INTRAVENOUS | Status: AC
  Administered 2022-09-25: 1000 mg via INTRAVENOUS
  Filled 2022-09-25: qty 100

## 2022-09-25 MED ORDER — POLYETHYLENE GLYCOL 3350 17 G PO PACK: 17.0000 g | PACK | Freq: Every day | ORAL | Status: DC | PRN

## 2022-09-25 MED ORDER — CEFAZOLIN SODIUM-DEXTROSE 2-4 GM/100ML-% IV SOLN
2.0000 g | INTRAVENOUS | Status: AC
  Administered 2022-09-25: 2 g via INTRAVENOUS
  Filled 2022-09-25: qty 100

## 2022-09-25 MED ORDER — FENTANYL CITRATE PF 50 MCG/ML IJ SOSY
50.0000 ug | PREFILLED_SYRINGE | INTRAMUSCULAR | Status: DC
  Administered 2022-09-25 (×2): 50 ug via INTRAVENOUS
  Filled 2022-09-25: qty 2

## 2022-09-25 MED ORDER — FENTANYL CITRATE PF 50 MCG/ML IJ SOSY: 25.0000 ug | PREFILLED_SYRINGE | INTRAMUSCULAR | Status: DC | PRN

## 2022-09-25 MED ORDER — ACETAMINOPHEN 325 MG PO TABS: 325.0000 mg | ORAL_TABLET | Freq: Four times a day (QID) | ORAL | Status: DC | PRN

## 2022-09-25 MED ORDER — POTASSIUM BICARBONATE 99 MG PO CAPS: 99.0000 mg | ORAL_CAPSULE | Freq: Every day | ORAL | Status: DC

## 2022-09-25 MED ORDER — MEPERIDINE HCL 50 MG/ML IJ SOLN: 6.2500 mg | INTRAMUSCULAR | Status: DC | PRN

## 2022-09-25 MED ORDER — INSULIN ASPART 100 UNIT/ML IJ SOLN
0.0000 [IU] | Freq: Three times a day (TID) | INTRAMUSCULAR | Status: DC
  Administered 2022-09-25: 5 [IU] via SUBCUTANEOUS
  Administered 2022-09-26 (×2): 2 [IU] via SUBCUTANEOUS

## 2022-09-25 MED ORDER — LACTATED RINGERS IV SOLN: INTRAVENOUS | Status: DC

## 2022-09-25 MED ORDER — ROPIVACAINE HCL 7.5 MG/ML IJ SOLN
INTRAMUSCULAR | Status: DC | PRN
  Administered 2022-09-25: 25 mL via PERINEURAL

## 2022-09-25 MED ORDER — METHOCARBAMOL 500 MG PO TABS
500.0000 mg | ORAL_TABLET | Freq: Four times a day (QID) | ORAL | Status: DC | PRN
  Administered 2022-09-26: 500 mg via ORAL
  Filled 2022-09-25: qty 1

## 2022-09-25 MED ORDER — VITAMIN C 500 MG PO TABS
1000.0000 mg | ORAL_TABLET | Freq: Every day | ORAL | Status: DC
  Administered 2022-09-25: 1000 mg via ORAL
  Filled 2022-09-25: qty 2

## 2022-09-25 MED ORDER — METOCLOPRAMIDE HCL 5 MG/ML IJ SOLN: 5.0000 mg | Freq: Three times a day (TID) | INTRAMUSCULAR | Status: DC | PRN

## 2022-09-25 MED ORDER — ONDANSETRON HCL 4 MG/2ML IJ SOLN: 4.0000 mg | Freq: Once | INTRAMUSCULAR | Status: DC | PRN

## 2022-09-25 MED ORDER — PHENOL 1.4 % MT LIQD: 1.0000 | OROMUCOSAL | Status: DC | PRN

## 2022-09-25 MED ORDER — BUPIVACAINE HCL (PF) 0.75 % IJ SOLN
INTRAMUSCULAR | Status: DC | PRN
  Administered 2022-09-25: 1.6 mg

## 2022-09-25 MED ORDER — BUPIVACAINE-EPINEPHRINE (PF) 0.25% -1:200000 IJ SOLN
INTRAMUSCULAR | Status: AC
  Filled 2022-09-25: qty 30

## 2022-09-25 MED ORDER — DEXAMETHASONE SODIUM PHOSPHATE 10 MG/ML IJ SOLN
INTRAMUSCULAR | Status: DC | PRN
  Administered 2022-09-25: 10 mg

## 2022-09-25 MED ORDER — POVIDONE-IODINE 10 % EX SWAB
2.0000 | Freq: Once | CUTANEOUS | Status: AC
  Administered 2022-09-25: 2 via TOPICAL

## 2022-09-25 MED ORDER — SODIUM CHLORIDE 0.9 % IR SOLN
Status: DC | PRN
  Administered 2022-09-25: 1000 mL

## 2022-09-25 MED ORDER — CEFAZOLIN SODIUM-DEXTROSE 1-4 GM/50ML-% IV SOLN
1.0000 g | Freq: Four times a day (QID) | INTRAVENOUS | Status: AC
  Administered 2022-09-25 (×2): 1 g via INTRAVENOUS
  Filled 2022-09-25 (×2): qty 50

## 2022-09-25 MED ORDER — CHLORTHALIDONE 25 MG PO TABS
25.0000 mg | ORAL_TABLET | Freq: Every day | ORAL | Status: DC
  Administered 2022-09-26: 25 mg via ORAL
  Filled 2022-09-25: qty 1

## 2022-09-25 MED ORDER — OXYCODONE HCL 5 MG/5ML PO SOLN: 5.0000 mg | Freq: Once | ORAL | Status: DC | PRN

## 2022-09-25 MED ORDER — PROPOFOL 1000 MG/100ML IV EMUL
INTRAVENOUS | Status: AC
  Filled 2022-09-25: qty 100

## 2022-09-25 MED ORDER — CHLORHEXIDINE GLUCONATE 0.12 % MT SOLN
15.0000 mL | Freq: Once | OROMUCOSAL | Status: AC
  Administered 2022-09-25: 15 mL via OROMUCOSAL

## 2022-09-25 MED ORDER — DOCUSATE SODIUM 100 MG PO CAPS
100.0000 mg | ORAL_CAPSULE | Freq: Two times a day (BID) | ORAL | Status: DC
  Filled 2022-09-25 (×2): qty 1

## 2022-09-25 MED ORDER — POTASSIUM CHLORIDE CRYS ER 20 MEQ PO TBCR
20.0000 meq | EXTENDED_RELEASE_TABLET | Freq: Two times a day (BID) | ORAL | Status: DC
  Administered 2022-09-25 – 2022-09-26 (×2): 20 meq via ORAL
  Filled 2022-09-25 (×2): qty 1

## 2022-09-25 MED ORDER — STERILE WATER FOR IRRIGATION IR SOLN
Status: DC | PRN
  Administered 2022-09-25: 2000 mL

## 2022-09-25 MED ORDER — PHENYLEPHRINE 80 MCG/ML (10ML) SYRINGE FOR IV PUSH (FOR BLOOD PRESSURE SUPPORT)
PREFILLED_SYRINGE | INTRAVENOUS | Status: DC | PRN
  Administered 2022-09-25 (×2): 80 ug via INTRAVENOUS
  Administered 2022-09-25: 40 ug via INTRAVENOUS
  Administered 2022-09-25: 80 ug via INTRAVENOUS

## 2022-09-25 MED ORDER — ONDANSETRON HCL 4 MG/2ML IJ SOLN: 4.0000 mg | Freq: Four times a day (QID) | INTRAMUSCULAR | Status: DC | PRN

## 2022-09-25 MED ORDER — ONDANSETRON HCL 4 MG PO TABS: 4.0000 mg | ORAL_TABLET | Freq: Four times a day (QID) | ORAL | Status: DC | PRN

## 2022-09-25 MED ORDER — OXYCODONE HCL 5 MG PO TABS: 5.0000 mg | ORAL_TABLET | Freq: Once | ORAL | Status: DC | PRN

## 2022-09-25 MED ORDER — ALUM & MAG HYDROXIDE-SIMETH 200-200-20 MG/5ML PO SUSP: 30.0000 mL | ORAL | Status: DC | PRN

## 2022-09-25 MED ORDER — LIDOCAINE 2% (20 MG/ML) 5 ML SYRINGE
INTRAMUSCULAR | Status: DC | PRN
  Administered 2022-09-25: 50 mg via INTRAVENOUS

## 2022-09-25 MED ORDER — METFORMIN HCL ER 500 MG PO TB24
500.0000 mg | ORAL_TABLET | Freq: Two times a day (BID) | ORAL | Status: DC
  Administered 2022-09-26: 500 mg via ORAL
  Filled 2022-09-25: qty 1

## 2022-09-25 MED ORDER — MENTHOL 3 MG MT LOZG: 1.0000 | LOZENGE | OROMUCOSAL | Status: DC | PRN

## 2022-09-25 MED ORDER — HYDROMORPHONE HCL 2 MG PO TABS: 2.0000 mg | ORAL_TABLET | ORAL | Status: DC | PRN

## 2022-09-25 MED ORDER — ORAL CARE MOUTH RINSE: 15.0000 mL | Freq: Once | OROMUCOSAL | Status: AC

## 2022-09-25 SURGICAL SUPPLY — 57 items
BAG COUNTER SPONGE SURGICOUNT (BAG) IMPLANT
BAG ZIPLOCK 12X15 (MISCELLANEOUS) ×1 IMPLANT
BENZOIN TINCTURE PRP APPL 2/3 (GAUZE/BANDAGES/DRESSINGS) IMPLANT
BLADE SAG 18X100X1.27 (BLADE) ×1 IMPLANT
BLADE SURG SZ10 CARB STEEL (BLADE) ×2 IMPLANT
BNDG ELASTIC 6X5.8 VLCR STR LF (GAUZE/BANDAGES/DRESSINGS) ×2 IMPLANT
BOWL SMART MIX CTS (DISPOSABLE) IMPLANT
COOLER ICEMAN CLASSIC (MISCELLANEOUS) ×1 IMPLANT
COVER SURGICAL LIGHT HANDLE (MISCELLANEOUS) ×1 IMPLANT
CUFF TOURN SGL QUICK 34 (TOURNIQUET CUFF) ×1
CUFF TRNQT CYL 34X4.125X (TOURNIQUET CUFF) ×1 IMPLANT
DRAPE INCISE IOBAN 66X45 STRL (DRAPES) ×1 IMPLANT
DRAPE U-SHAPE 47X51 STRL (DRAPES) ×1 IMPLANT
DRSG AQUACEL AG ADV 3.5X10 (GAUZE/BANDAGES/DRESSINGS) IMPLANT
DURAPREP 26ML APPLICATOR (WOUND CARE) ×1 IMPLANT
ELECT BLADE TIP CTD 4 INCH (ELECTRODE) ×1 IMPLANT
ELECT REM PT RETURN 15FT ADLT (MISCELLANEOUS) ×1 IMPLANT
FEMORAL POSTERIOR SZ5 LFT (Femur) IMPLANT
GAUZE PAD ABD 8X10 STRL (GAUZE/BANDAGES/DRESSINGS) ×2 IMPLANT
GAUZE SPONGE 4X4 12PLY STRL (GAUZE/BANDAGES/DRESSINGS) ×1 IMPLANT
GAUZE XEROFORM 1X8 LF (GAUZE/BANDAGES/DRESSINGS) IMPLANT
GLOVE BIO SURGEON STRL SZ7.5 (GLOVE) ×1 IMPLANT
GLOVE BIOGEL PI IND STRL 8 (GLOVE) ×2 IMPLANT
GLOVE ECLIPSE 8.0 STRL XLNG CF (GLOVE) ×1 IMPLANT
GOWN STRL REUS W/ TWL XL LVL3 (GOWN DISPOSABLE) ×2 IMPLANT
GOWN STRL REUS W/TWL XL LVL3 (GOWN DISPOSABLE) ×2
HANDPIECE INTERPULSE COAX TIP (DISPOSABLE) ×1
HOLDER FOLEY CATH W/STRAP (MISCELLANEOUS) IMPLANT
IMMOBILIZER KNEE 20 (SOFTGOODS) ×2
IMMOBILIZER KNEE 20 THIGH 36 (SOFTGOODS) ×1 IMPLANT
IMMOBILIZER KNEE 22 UNIV (SOFTGOODS) IMPLANT
INSERT TBL BEARNG SZ5 10 KNEE (Miscellaneous) IMPLANT
INSERT TIB PS SZ5 11 KNEE (Miscellaneous) IMPLANT
KIT TURNOVER KIT A (KITS) IMPLANT
KNEE PATELLA ASYMMETRIC 10X32 (Knees) IMPLANT
KNEE TIBIAL COMPONENT SZ5 (Knees) IMPLANT
NS IRRIG 1000ML POUR BTL (IV SOLUTION) ×1 IMPLANT
PACK TOTAL KNEE CUSTOM (KITS) ×1 IMPLANT
PAD COLD SHLDR WRAP-ON (PAD) ×1 IMPLANT
PADDING CAST COTTON 6X4 STRL (CAST SUPPLIES) ×2 IMPLANT
PIN FLUTED HEDLESS FIX 3.5X1/8 (PIN) IMPLANT
POSTERIOR FEMORAL SZ5 LFT (Femur) ×1 IMPLANT
PROTECTOR NERVE ULNAR (MISCELLANEOUS) ×1 IMPLANT
SET HNDPC FAN SPRY TIP SCT (DISPOSABLE) ×1 IMPLANT
SET PAD KNEE POSITIONER (MISCELLANEOUS) ×1 IMPLANT
SPIKE FLUID TRANSFER (MISCELLANEOUS) IMPLANT
STAPLER VISISTAT 35W (STAPLE) IMPLANT
STRIP CLOSURE SKIN 1/2X4 (GAUZE/BANDAGES/DRESSINGS) IMPLANT
SUT MNCRL AB 4-0 PS2 18 (SUTURE) IMPLANT
SUT VIC AB 0 CT1 27 (SUTURE) ×1
SUT VIC AB 0 CT1 27XBRD ANTBC (SUTURE) ×1 IMPLANT
SUT VIC AB 1 CT1 36 (SUTURE) ×2 IMPLANT
SUT VIC AB 2-0 CT1 27 (SUTURE) ×2
SUT VIC AB 2-0 CT1 TAPERPNT 27 (SUTURE) ×2 IMPLANT
TRAY FOLEY MTR SLVR 16FR STAT (SET/KITS/TRAYS/PACK) IMPLANT
WATER STERILE IRR 1000ML POUR (IV SOLUTION) ×2 IMPLANT
WRAP KNEE MAXI GEL POST OP (GAUZE/BANDAGES/DRESSINGS) IMPLANT

## 2022-09-25 NOTE — Anesthesia Procedure Notes (Signed)
Spinal  Patient location during procedure: OR Start time: 09/25/2022 9:55 AM End time: 09/25/2022 10:00 AM Reason for block: surgical anesthesia Staffing Performed by: Lieutenant Diego, CRNA Authorized by: Janeece Riggers, MD   Preanesthetic Checklist Completed: patient identified, IV checked, site marked, risks and benefits discussed, surgical consent, monitors and equipment checked, pre-op evaluation and timeout performed Spinal Block Patient position: sitting Prep: DuraPrep Patient monitoring: heart rate, cardiac monitor, continuous pulse ox and blood pressure Approach: midline Location: L3-4 Injection technique: single-shot Needle Needle type: Pencan  Needle gauge: 24 G Needle length: 9 cm Assessment Sensory level: T4 Events: CSF return Additional Notes Pt sitting, sterile prep and drape. Sterile gloves. Localized pt skin. Free flow CSF obtained prior to injecting 0.5% pres free bupivcaine 1.62m. Aspirated CSF after local injected. Pt supine. Tol well, VSS.

## 2022-09-25 NOTE — TOC Transition Note (Signed)
Transition of Care [facility]) - CM/SW Discharge Note   Patient Details  Name: Richard Harding MRN: [id number] Date of Birth: [birth year]  [facility] of [facility] CM/SW Contact:  Lennart Pall, LCSW Phone Number: 09/25/2022, 3:08 PM   Clinical Narrative:    Met with pt and confirming he has needed DME at home.  HHPT prearranged with [facility].  No TOC needs.   Final next level of care: [place] Barriers to Discharge: No Barriers Identified   Patient Goals and CMS Choice Patient states their goals for this hospitalization and ongoing recovery are:: return home      Discharge Placement                       Discharge Plan and Services                DME Arranged: N/A DME Agency: NA       HH Arranged: PT [place] Agency: [place]        Social Determinants of Health (SDOH) Interventions     Readmission Risk Interventions     No data to display

## 2022-09-25 NOTE — Anesthesia Procedure Notes (Signed)
Procedure Name: MAC Date/Time: 09/25/2022 10:18 AM  Performed by: Lieutenant Diego, CRNAPre-anesthesia Checklist: Patient identified, Emergency Drugs available, Suction available, Patient being monitored and Timeout performed Patient Re-evaluated:Patient Re-evaluated prior to induction Oxygen Delivery Method: Simple face mask Preoxygenation: Pre-oxygenation with 100% oxygen Induction Type: IV induction

## 2022-09-25 NOTE — Anesthesia Preprocedure Evaluation (Addendum)
Anesthesia Evaluation  Patient identified by MRN, date of birth, ID band Patient awake    Reviewed: Allergy & Precautions, NPO status , Patient's Chart, lab work & pertinent test results  Airway Mallampati: I  TM Distance: >3 FB Neck ROM: Full    Dental   Pulmonary    Pulmonary exam normal        Cardiovascular hypertension, Pt. on medications and Pt. on home beta blockers Normal cardiovascular exam+ dysrhythmias      Neuro/Psych    GI/Hepatic   Endo/Other  diabetes, Type 2, Oral Hypoglycemic Agents  Renal/GU      Musculoskeletal   Abdominal   Peds  Hematology   Anesthesia Other Findings   Reproductive/Obstetrics                            Anesthesia Physical  Anesthesia Plan  ASA: 3  Anesthesia Plan: Spinal and MAC   Post-op Pain Management:    Induction: Intravenous  PONV Risk Score and Plan: Treatment may vary due to age or medical condition and Ondansetron  Airway Management Planned: Nasal Cannula, Natural Airway and Simple Face Mask  Additional Equipment: None  Intra-op Plan:   Post-operative Plan:   Informed Consent: I have reviewed the patients History and Physical, chart, labs and discussed the procedure including the risks, benefits and alternatives for the proposed anesthesia with the patient or authorized representative who has indicated his/her understanding and acceptance.       Plan Discussed with: CRNA and Anesthesiologist  Anesthesia Plan Comments:         Anesthesia Quick Evaluation

## 2022-09-25 NOTE — Anesthesia Postprocedure Evaluation (Signed)
Anesthesia Post Note  Patient: Richard Harding  Procedure(s) Performed: LEFT TOTAL KNEE ARTHROPLASTY (Left: Knee)     Patient location during evaluation: PACU Anesthesia Type: MAC Level of consciousness: awake and alert Pain management: pain level controlled Vital Signs Assessment: post-procedure vital signs reviewed and stable Respiratory status: spontaneous breathing, nonlabored ventilation, respiratory function stable and patient connected to nasal cannula oxygen Cardiovascular status: stable and blood pressure returned to baseline Postop Assessment: no apparent nausea or vomiting Anesthetic complications: no   No notable events documented.  Last Vitals:  Vitals:   09/25/22 0808  BP: 125/70  Pulse: 73  Resp: 12  Temp: 36.8 C  SpO2: 97%    Last Pain:  Vitals:   09/25/22 0856  TempSrc:   PainSc: 0-No pain                 [name]

## 2022-09-25 NOTE — Transfer of Care (Signed)
Immediate Anesthesia Transfer of Care Note  Patient: Richard Harding  Procedure(s) Performed: LEFT TOTAL KNEE ARTHROPLASTY (Left: Knee)  Patient Location: PACU  Anesthesia Type:Regional and Spinal  Level of Consciousness: awake and alert   Airway & Oxygen Therapy: Patient Spontanous Breathing and Patient connected to face mask oxygen  Post-op Assessment: Report given to RN and Post -op Vital signs reviewed and stable  Post vital signs: Reviewed and stable  Last Vitals:  Vitals Value Taken Time  BP    Temp    Pulse    Resp    SpO2      Last Pain:  Vitals:   09/25/22 0856  TempSrc:   PainSc: 0-No pain      Patients Stated Pain Goal: 4 (55/20/80 2233)  Complications: No notable events documented.

## 2022-09-25 NOTE — Op Note (Signed)
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 Ninfa Linden, MD Assistant: Benita Stabile, PA-C  Anesthesia: #1 left lower extremity abductor canal block, #2 spinal, #3 local Tourniquet time: Less than 1 hour Antibiotics: 2 g IV Ancef EBL: Less than 778 cc Complications: None  Indications: The patient is a 76 year old gentleman with well-documented severe arthritis of his left knee.  We have actually replaced his right knee before.  At this point he has tried and failed conservative 5 degrees of external rotation.  His left knee and he has well-documented severe end-stage arthritis of the left knee.  At this point he does wish to proceed with a knee replacement.  Having had type of procedure before in his right knee, he is fully aware of all the risk and benefits of the surgery.  Procedure description after informed consent was obtained the appropriate left knee was marked, a regional block was obtained the left lower extremity the holding room and he was brought to the operating room and set up on the operative table where spinal anesthesia was obtained.  He was laid in supine position on the operating table and a Foley catheter was placed.  A nonsterile finger is placed around his upper left thigh and his left thigh, knee, leg, ankle and foot were prepped and draped with DuraPrep and sterile drapes.  A timeout was called and he identified the correct patient correct left knee.  We then used an Esmarch to wrap out the leg and the tourniquet was inflated to 300 mm of pressure.  We then made a direct midline incision over the patella and carried this  proximally distally.  Dissection was carried down to the knee joint and a medial parapatellar arthrotomy was carried out.  There was a moderate joint effusion noted and significant arthritis throughout the knee.  With the knee in a flexed position we remove remnants of the ACL, PCL, medial and lateral meniscus.  Osteophytes were removed from all 3 compartments.  There is severe cartilage wear of the medial compartment the knee and the patellofemoral joint.  Using an intramedullary cutting guide for proximal tibia cut, we made this cut for correcting valgus and varus and a 0 degree slope.  We made the cut to take 9 mm off the high side.  This cut was made without difficulty.  Attention was then turned to the femur.  With an intramedullary cutting guide we did our distal femoral cut for taking 10 mm off the distal femoral cut.  The distal femur cut was made without difficulty and brought the knee back down to full extension and with a 10 mm extension block he had achieved full extension.  We then went back to the femur and put a femoral sizing guide based off the epicondylar axis.  Based off of this we chose a size 5 femur.  We put a 4-in-1 cutting block for size 5 femur and made her anterior and posterior cuts followed our chamfer cuts.  We then made our femoral box cut.  Attention was then turned back to the tibia.  We chose a size 5 tibial tray for coverage over the tibial toe setting the rotation of the tibial tubercle and the femur.  We then made our keel punch and drill hole off of this and again this was all done for press-fit implants given the good quality of his bone and the fact that he has press-fit knee on his right side that is done well well.  We then trialed our size 5 tibial tray followed by our size 5 left femur we placed a 10 mm fixed-bearing polythene insert we felt the final implant will need to be an 11 mm insert.  We then made our patella cut and drilled 3 holes for a size 32 press-fit patella  button.  We then reviewed all trials rotation of the knee and irrigated near normal sinus dilution.  We placed Marcaine around the arthrotomy with epinephrine.  We then placed our real Stryker triathlon press-fit tibial tray size 5 followed by our size 5 left femur.  We tried to place the real 11 mm fixed-bearing poly insert and we just could not get it centered insert so we went down to 10 mm insert and that when was solid and gave [place] good stability.  We then press-fit our size 32 patella button.  We put the knee through several cycles of motion with the implants in place.  We then let her tourniquet down and hemostasis was obtained electrocautery.  The arthrotomy was closed with interrupted #1 Vicryl suture followed by 0 Vicryl close the deep tissue and 2-0 Vicryl close subcutaneous tissue.  The skin was closed with staples.  Well-padded sterile dressing was applied.  The patient was taken recovery in stable addition with all final counts being correct and no complications noted.  Benita Stabile, PA-C did assisted in the entire case and his assistance was medically necessary for soft tissue retraction, helping guide implant placement and a layered closure of the wound.

## 2022-09-25 NOTE — Anesthesia Procedure Notes (Signed)
Anesthesia Regional Block: Adductor canal block   Pre-Anesthetic Checklist: , timeout performed,  Correct Patient, Correct Site, Correct Laterality,  Correct Procedure, Correct Position, site marked,  Risks and benefits discussed,  Surgical consent,  Pre-op evaluation,  At surgeon's request and post-op pain management  Laterality: Left  Prep: chloraprep       Needles:  Injection technique: Single-shot  Needle Type: Echogenic Stimulator Needle     Needle Length: 5cm  Needle Gauge: 22     Additional Needles:   Procedures:, nerve stimulator,,, ultrasound used (permanent image in chart),,    Narrative:  Start time: 09/25/2022 8:50 AM End time: 09/25/2022 8:55 AM Injection made incrementally with aspirations every 5 mL.  Performed by: Personally  Anesthesiologist: Janeece Riggers, MD  Additional Notes: Functioning IV was confirmed and monitors were applied.  A 27m 22ga Arrow echogenic stimulator needle was used. Sterile prep and drape,hand hygiene and sterile gloves were used. Ultrasound guidance: relevant anatomy identified, needle position confirmed, local anesthetic spread visualized around nerve(s)., vascular puncture avoided.  Image printed for medical record. Negative aspiration and negative test dose prior to incremental administration of local anesthetic. The patient tolerated the procedure well.

## 2022-09-25 NOTE — Interval H&P Note (Signed)
History and Physical Interval Note: The patient understands that he is here today for a left knee replacement to treat his left knee osteoarthritis.  There has been no acute or interval changes medical status.  See H&P.  The risks and benefits of surgery been explained in detail and informed consent was obtained.  The left operative knee has been marked.  09/25/2022 8:41 AM  [place]  has presented today for surgery, with the diagnosis of OSTEOARTHRITIS / [place].  The various methods of treatment have been discussed with the patient and family. After consideration of risks, benefits and other options for treatment, the patient has consented to  Procedure(s): LEFT TOTAL KNEE ARTHROPLASTY (Left) as a surgical intervention.  The patient's history has been reviewed, patient examined, no change in status, stable for surgery.  I have reviewed the patient's chart and labs.  Questions were answered to the patient's satisfaction.     Mcarthur Rossetti

## 2022-09-25 NOTE — Plan of Care (Signed)
  Problem: Pain Management: Goal: Pain level will decrease with appropriate interventions Outcome: Progressing   Problem: Pain Management: Goal: Pain level will decrease with appropriate interventions Outcome: Progressing   Problem: Nutrition: Goal: Adequate nutrition will be maintained Outcome: Progressing

## 2022-09-26 DIAGNOSIS — I10 Essential (primary) hypertension: Secondary | ICD-10-CM | POA: Diagnosis not present

## 2022-09-26 DIAGNOSIS — Z96651 Presence of right artificial knee joint: Secondary | ICD-10-CM | POA: Diagnosis not present

## 2022-09-26 DIAGNOSIS — E119 Type 2 diabetes mellitus without complications: Secondary | ICD-10-CM | POA: Diagnosis not present

## 2022-09-26 DIAGNOSIS — Z79899 Other long term (current) drug therapy: Secondary | ICD-10-CM | POA: Diagnosis not present

## 2022-09-26 DIAGNOSIS — Z7982 Long term (current) use of aspirin: Secondary | ICD-10-CM | POA: Diagnosis not present

## 2022-09-26 DIAGNOSIS — Z7984 Long term (current) use of oral hypoglycemic drugs: Secondary | ICD-10-CM | POA: Diagnosis not present

## 2022-09-26 DIAGNOSIS — M1712 Unilateral primary osteoarthritis, left knee: Secondary | ICD-10-CM | POA: Diagnosis not present

## 2022-09-26 LAB — BASIC METABOLIC PANEL
Anion gap: 7 (ref 5–15)
BUN: 19 mg/dL (ref 8–23)
CO2: 27 mmol/L (ref 22–32)
Calcium: 8.6 mg/dL — ABNORMAL LOW (ref 8.9–10.3)
Chloride: 103 mmol/L (ref 98–111)
Creatinine, Ser: 0.91 mg/dL (ref 0.61–1.24)
GFR, Estimated: >60 mL/min (ref >60)
Glucose, Bld: 127 mg/dL — ABNORMAL HIGH (ref 70–99)
Potassium: 3.2 mmol/L — ABNORMAL LOW (ref 3.5–5.1)
Sodium: 137 mmol/L (ref 135–145)

## 2022-09-26 LAB — CBC
HCT: 37.8 % — ABNORMAL LOW (ref 39.0–52.0)
Hemoglobin: 12.8 g/dL — ABNORMAL LOW (ref 13.0–17.0)
MCH: 28.4 pg (ref 26.0–34.0)
MCHC: 33.9 g/dL (ref 30.0–36.0)
MCV: 84 fL (ref 80.0–100.0)
Platelets: 261 10*3/uL (ref 150–400)
RBC: 4.5 MIL/uL (ref 4.22–5.81)
RDW: 12.4 % (ref 11.5–15.5)
WBC: 12.8 10*3/uL — ABNORMAL HIGH (ref 4.0–10.5)
nRBC: 0 % (ref 0.0–0.2)

## 2022-09-26 LAB — GLUCOSE, CAPILLARY
Glucose-Capillary: 122 mg/dL — ABNORMAL HIGH (ref 70–99)
Glucose-Capillary: 139 mg/dL — ABNORMAL HIGH (ref 70–99)

## 2022-09-26 MED ORDER — METHOCARBAMOL 500 MG PO TABS: 500.0000 mg | ORAL_TABLET | Freq: Four times a day (QID) | ORAL | 0 refills | Status: DC | PRN

## 2022-09-26 MED ORDER — ASPIRIN 81 MG PO CHEW: 81.0000 mg | CHEWABLE_TABLET | Freq: Two times a day (BID) | ORAL | 0 refills | Status: DC

## 2022-09-26 MED ORDER — OXYCODONE HCL 5 MG PO TABS: 5.0000 mg | ORAL_TABLET | Freq: Four times a day (QID) | ORAL | 0 refills | Status: DC | PRN

## 2022-09-26 NOTE — Progress Notes (Signed)
Subjective: 1 Day Post-Op Procedure(s) (LRB): LEFT TOTAL KNEE ARTHROPLASTY (Left) Patient reports pain as moderate.    Objective: Vital signs in last 24 hours: Temp:  [97.6 F (36.4 C)-98.2 F (36.8 C)] 97.9 F (36.6 C) (10/07 1110) Pulse Rate:  [69-90] 75 (10/07 1110) Resp:  [13-18] 17 (10/07 1110) BP: (118-146)/(55-83) 120/66 (10/07 1000) SpO2:  [92 %-100 %] 99 % (10/07 1110)  Intake/Output from previous day: 10/06 0701 - 10/07 0700 In: 2397.4 [P.O.:960; I.V.:1137.4; IV Piggyback:300] Out: 4400 [Urine:4350; Blood:50] Intake/Output this shift: No intake/output data recorded.  Recent Labs    09/26/22 0330  HGB 12.8*   Recent Labs    09/26/22 0330  WBC 12.8*  RBC 4.50  HCT 37.8*  PLT 261   Recent Labs    09/25/22 0740 09/26/22 0330  NA  --  137  K 3.0* 3.2*  CL  --  103  CO2  --  27  BUN  --  19  CREATININE  --  0.91  GLUCOSE  --  127*  CALCIUM  --  8.6*   No results for input(s): "LABPT", "INR" in the last 72 hours.  Sensation intact distally Intact pulses distally Dorsiflexion/Plantar flexion intact Incision: dressing C/D/I Compartment soft   Assessment/Plan: 1 Day Post-Op Procedure(s) (LRB): LEFT TOTAL KNEE ARTHROPLASTY (Left) Up with therapy Discharge home with home health this afternoon.      Mcarthur Rossetti 09/26/2022, 11:25 AM

## 2022-09-26 NOTE — Discharge Instructions (Signed)

## 2022-09-26 NOTE — Discharge Summary (Signed)
Patient ID: Richard Harding MRN: [id number] DOB/AGE: 1946-01-06 76 y.o.  Admit date: 09/25/2022 Discharge date: 09/26/2022  Admission Diagnoses:  Principal Problem:   Unilateral primary osteoarthritis, left knee Active Problems:   Status post total left knee replacement   Discharge Diagnoses:  Same  Past Medical History:  Diagnosis Date   Arthritis    Arthritis of right knee 04/25/2020   Carpal tunnel syndrome, bilateral    Chickenpox    Diabetes mellitus without complication ([place])    Hyperlipemia    Hypertension    RBBB 04/17/2020   Noted on EKG    Surgeries: Procedure(s): LEFT TOTAL KNEE ARTHROPLASTY on 09/25/2022   Consultants:   Discharged Condition: Improved  Hospital Course: [name] is an 76 y.o. male who was admitted 09/25/2022 for operative treatment ofUnilateral primary osteoarthritis, left knee. Patient has severe unremitting pain that affects sleep, daily activities, and work/hobbies. After pre-op clearance the patient was taken to the operating room on 09/25/2022 and underwent  Procedure(s): LEFT TOTAL KNEE ARTHROPLASTY.    Patient was given perioperative antibiotics:  Anti-infectives (From admission, onward)    Start     Dose/Rate Route Frequency Ordered Stop   09/25/22 1600  ceFAZolin (ANCEF) IVPB 1 g/50 mL premix        1 g 100 mL/hr over 30 Minutes Intravenous Every 6 hours 09/25/22 1343 09/25/22 2252   09/25/22 1430  ceFAZolin (ANCEF) IVPB 1 g/50 mL premix  Status:  Discontinued        1 g 100 mL/hr over 30 Minutes Intravenous Every 6 hours 09/25/22 1336 09/25/22 1343   09/25/22 0745  ceFAZolin (ANCEF) IVPB 2g/100 mL premix        2 g 200 mL/hr over 30 Minutes Intravenous On call to O.R. 09/25/22 0733 09/25/22 1011        Patient was given sequential compression devices, early ambulation, and chemoprophylaxis to prevent DVT.  Patient benefited maximally from hospital stay and there were no complications.    Recent vital signs:  Patient Vitals for the past 24 hrs:  BP Temp Temp src Pulse Resp SpO2  09/26/22 1110 -- 97.9 F (36.6 C) Oral 75 17 99 %  09/26/22 1000 120/66 -- -- -- -- --  09/26/22 0556 (!) 143/73 97.9 F (36.6 C) Oral 69 16 96 %  09/26/22 0208 133/83 98.2 F (36.8 C) Oral 71 18 98 %  09/25/22 2006 118/71 98 F (36.7 C) Oral 84 18 92 %  09/25/22 1537 135/77 -- -- 90 16 97 %  09/25/22 1338 (!) 146/78 98 F (36.7 C) Oral 86 16 97 %  09/25/22 1315 137/76 -- -- -- 16 --  09/25/22 1300 -- -- -- -- 18 100 %  09/25/22 1245 127/73 -- -- 71 16 100 %  09/25/22 1230 -- -- -- 80 14 97 %  09/25/22 1215 139/73 98 F (36.7 C) -- 73 13 97 %  09/25/22 1200 (!) 121/55 -- -- 78 16 100 %  09/25/22 1153 120/68 97.6 F (36.4 C) -- 77 17 100 %     Recent laboratory studies:  Recent Labs    09/25/22 0740 09/26/22 0330  WBC  --  12.8*  HGB  --  12.8*  HCT  --  37.8*  PLT  --  261  NA  --  137  K 3.0* 3.2*  CL  --  103  CO2  --  27  BUN  --  19  CREATININE  --  0.91  GLUCOSE  --  127*  CALCIUM  --  8.6*     Discharge Medications:   Allergies as of 09/26/2022       Reactions   Cinnamon Flavor Other (See Comments)   Cinnamon chewing gum caused blisters on tongue        Medication List     STOP taking these medications    aspirin EC 81 MG tablet Replaced by: aspirin 81 MG chewable tablet       TAKE these medications    Aleve 220 MG tablet Generic drug: naproxen sodium Take 220 mg by mouth daily as needed (pain).   aspirin 81 MG chewable tablet Chew 1 tablet (81 mg total) by mouth 2 (two) times daily. Replaces: aspirin EC 81 MG tablet   atorvastatin 10 MG tablet Commonly known as: LIPITOR Take 1 tablet (10 mg total) by mouth daily.   b complex vitamins capsule Take 1 capsule by mouth daily.   blood glucose meter kit and supplies Kit Check blood glucose once daily. E11.9.   CALCIUM 1200 PO Take 1 tablet by mouth 3 (three) times a week.   chlorthalidone 25 MG  tablet Commonly known as: HYGROTON Take 1 tablet (25 mg total) by mouth daily.   dapagliflozin propanediol 5 MG Tabs tablet Commonly known as: Farxiga Take 1 tablet (5 mg total) by mouth daily before breakfast.   Dexcom G6 Sensor Misc USE AS DIRECTED   Dexcom G6 Transmitter Misc 1 each by Does not apply route continuous.   fenofibrate 54 MG tablet Take 1 tablet (54 mg total) by mouth daily.   GLUCOSA-CHONDR-NA CHONDR-MSM PO Take 1 capsule by mouth at bedtime.   metFORMIN 500 MG 24 hr tablet Commonly known as: GLUCOPHAGE-XR Take 2 tablets (1,000 mg total) by mouth daily with breakfast. What changed:  how much to take when to take this   methocarbamol 500 MG tablet Commonly known as: ROBAXIN Take 1 tablet (500 mg total) by mouth every 6 (six) hours as needed for muscle spasms.   metoprolol tartrate 50 MG tablet Commonly known as: LOPRESSOR Take 1 tablet (50 mg total) by mouth 2 (two) times daily. What changed:  how much to take when to take this   multivitamin with minerals Tabs tablet Take 1 tablet by mouth daily.   omega-3 acid ethyl esters 1 g capsule Commonly known as: LOVAZA Take 1 capsule (1 g total) by mouth 4 (four) times daily. What changed:  how much to take when to take this   oxyCODONE 5 MG immediate release tablet Commonly known as: Oxy IR/ROXICODONE Take 1-2 tablets (5-10 mg total) by mouth every 6 (six) hours as needed for moderate pain (pain score 4-6). No more than 6 tablets daily.   polyvinyl alcohol 1.4 % ophthalmic solution Commonly known as: LIQUIFILM TEARS Place 1 drop into both eyes as needed for dry eyes.   Potassium Bicarbonate 99 MG Caps Take 99 mg by mouth daily.   potassium chloride SA 20 MEQ tablet Commonly known as: KLOR-CON M Take 1 tablet (20 mEq total) by mouth 2 (two) times daily.   PRENATAL VITAMIN PO Take 1 tablet by mouth daily.   Red Yeast Rice 600 MG Caps Take 1 capsule (600 mg total) by mouth daily.    triamcinolone ointment 0.1 % Commonly known as: KENALOG Apply 1 Application topically 2 (two) times daily as needed for rash.   vitamin C 1000 MG tablet Take 1,000 mg by mouth at bedtime.  Diagnostic Studies: DG Knee Left Port  Result Date: 09/25/2022 CLINICAL DATA:  Postop left total knee arthroplasty. EXAM: PORTABLE LEFT KNEE - 1-2 VIEW COMPARISON:  Radiographs 04/08/2022. FINDINGS: Status post left total knee arthroplasty. The hardware is well positioned. There is no evidence of acute fracture or dislocation. Anterior skin staples are in place. There is a small amount of gas in the joint and surrounding soft tissues. No unexpected foreign bodies. IMPRESSION: Status post left total knee arthroplasty without demonstrated complication. Electronically Signed   By: Richardean Sale M.D.   On: 09/25/2022 12:44    Disposition: Discharge disposition: 01-Home or [place], [place] Follow up.   Specialty: [place] Why: to provide home physical therapy visits Contact information: [address] [place] [address] [phone]         Mcarthur Rossetti, MD Follow up in 2 week(s).   Specialty: Orthopedic Surgery Contact information: [address] [place] [address] [phone]                  Signed: Mcarthur Rossetti 09/26/2022, 11:27 AM

## 2022-09-26 NOTE — Progress Notes (Signed)
Physical Therapy Treatment Patient Details Name: Richard Harding MRN: [id number] DOB: [birth year] Today's Date: 09/26/2022   History of Present Illness Pt s/p L TKR and with hx of R TKR, DM, and RBBB    PT Comments    Pt continues very motivated and up to ambulate in hall, negotiated stairs, and performed HEP with written instruction provided and reviewed.  Pt eager for dc home this date.   Recommendations for follow up therapy are one component of a multi-disciplinary discharge planning process, led by the attending physician.  Recommendations may be updated based on patient status, additional functional criteria and insurance authorization.  Follow Up Recommendations  Follow physician's recommendations for discharge plan and follow up therapies     Assistance Recommended at Discharge Set up Supervision/Assistance  Patient can return home with the following A little help with walking and/or transfers;A little help with bathing/dressing/bathroom;Assistance with cooking/housework;Assist for transportation;Help with stairs or ramp for entrance   Equipment Recommendations  None recommended by PT    Recommendations for Other Services       Precautions / Restrictions Precautions Precautions: Knee;Fall Required Braces or Orthoses: Knee Immobilizer - Left Knee Immobilizer - Left: Discontinue once straight leg raise with < 10 degree lag Restrictions Weight Bearing Restrictions: No LLE Weight Bearing: Weight bearing as tolerated     Mobility  Bed Mobility Overal bed mobility: Needs Assistance Bed Mobility: Supine to Sit     Supine to sit: Supervision     General bed mobility comments: Pt up in chair and requests back to same    Transfers Overall transfer level: Needs assistance Equipment used: Rolling walker (2 wheels) Transfers: Sit to/from Stand Sit to Stand: Min guard, Supervision           General transfer comment: min cues for LE management and use of UEs to  self assist    Ambulation/Gait Ambulation/Gait assistance: Min guard, Supervision Gait Distance (Feet): 100 Feet Assistive device: Rolling walker (2 wheels) Gait Pattern/deviations: Step-to pattern, Decreased step length - right, Decreased step length - left, Shuffle, Trunk flexed Gait velocity: decr     General Gait Details: cues for posture, position from RW and initial sequence   Stairs Stairs: Yes Stairs assistance: Min assist Stair Management: No rails, Step to pattern, Forwards, With walker Number of Stairs: 2 General stair comments: single step twice with cues for sequence and RW placement   Wheelchair Mobility    Modified Rankin (Stroke Patients Only)       Balance Overall balance assessment: Needs assistance Sitting-balance support: No upper extremity supported, Feet supported Sitting balance-Leahy Scale: Good     Standing balance support: No upper extremity supported Standing balance-Leahy Scale: Fair                              Cognition Arousal/Alertness: Awake/alert Behavior During Therapy: WFL for tasks assessed/performed Overall Cognitive Status: Within Functional Limits for tasks assessed                                          Exercises Total Joint Exercises Ankle Circles/Pumps: AROM, Both, 15 reps, Supine Quad Sets: AROM, Both, 10 reps, Supine Heel Slides: AAROM, Left, 15 reps, Supine Straight Leg Raises: AAROM, AROM, Left, 10 reps, Supine    General Comments        Pertinent Vitals/Pain Pain  Assessment Pain Assessment: 0-10 Pain Score: 5  Pain Location: L knee Pain Descriptors / Indicators: Aching, Sore Pain Intervention(s): Limited activity within patient's tolerance, Monitored during session, Premedicated before session, Ice applied    Home Living                          Prior Function            PT Goals (current goals can now be found in the care plan section) Acute Rehab PT  Goals Patient Stated Goal: Regain IND PT Goal Formulation: With patient Time For Goal Achievement: 10/03/22 Potential to Achieve Goals: Good Progress towards PT goals: Progressing toward goals    Frequency    7X/week      PT Plan Current plan remains appropriate    Co-evaluation              AM-PAC PT "6 Clicks" Mobility   Outcome Measure  Help needed turning from your back to your side while in a flat bed without using bedrails?: A Little Help needed moving from lying on your back to sitting on the side of a flat bed without using bedrails?: A Little Help needed moving to and from a bed to a chair (including a wheelchair)?: A Little Help needed standing up from a chair using your arms (e.g., wheelchair or bedside chair)?: A Little Help needed to walk in hospital room?: A Little Help needed climbing 3-5 steps with a railing? : A Little 6 Click Score: 18    End of Session Equipment Utilized During Treatment: Gait belt Activity Tolerance: Patient tolerated treatment well Patient left: in chair;with call bell/phone within reach;with chair alarm set;with family/visitor present Nurse Communication: Mobility status PT Visit Diagnosis: Difficulty in walking, not elsewhere classified (R26.2)     Time: 1325-1405 PT Time Calculation (min) (ACUTE ONLY): 40 min  Charges:  $Gait Training: 8-22 mins $Therapeutic Exercise: 8-22 mins $Therapeutic Activity: 8-22 mins                     [place] Pager [phone] Office [phone]    Fotios Amos 09/26/2022, 4:15 PM

## 2022-09-26 NOTE — Evaluation (Signed)
Physical Therapy Evaluation Patient Details Name: Richard Harding MRN: [id number] DOB: 06-Jul-1946 Today's Date: 09/26/2022  History of Present Illness  Pt s/p L TKR and with hx of R TKR, DM, and RBBB  Clinical Impression  Pt s/p L TKR and presents with decreased L LE strength/ROM and post op pain limiting functional mobility.  Pt should progress to dc home with 24/7 caregiver assist.     Recommendations for follow up therapy are one component of a multi-disciplinary discharge planning process, led by the attending physician.  Recommendations may be updated based on patient status, additional functional criteria and insurance authorization.  Follow Up Recommendations Follow physician's recommendations for discharge plan and follow up therapies      Assistance Recommended at Discharge Set up Supervision/Assistance  Patient can return home with the following  A little help with walking and/or transfers;A little help with bathing/dressing/bathroom;Assistance with cooking/housework;Assist for transportation;Help with stairs or ramp for entrance    Equipment Recommendations None recommended by PT  Recommendations for Other Services       Functional Status Assessment Patient has had a recent decline in their functional status and demonstrates the ability to make significant improvements in function in a reasonable and predictable amount of time.     Precautions / Restrictions Precautions Precautions: Knee;Fall Required Braces or Orthoses: Knee Immobilizer - Left Knee Immobilizer - Left: Discontinue once straight leg raise with < 10 degree lag (Pt performed IND SLR this am) Restrictions Weight Bearing Restrictions: No LLE Weight Bearing: Weight bearing as tolerated      Mobility  Bed Mobility Overal bed mobility: Needs Assistance Bed Mobility: Supine to Sit     Supine to sit: Supervision     General bed mobility comments: sup for safety but no physical assist     Transfers Overall transfer level: Needs assistance Equipment used: Rolling walker (2 wheels) Transfers: Sit to/from Stand Sit to Stand: Min assist, Min guard           General transfer comment: Steady assist with cues for LE management and use of UEs to self assist    Ambulation/Gait Ambulation/Gait assistance: Min assist, Min guard Gait Distance (Feet): 100 Feet Assistive device: Rolling walker (2 wheels) Gait Pattern/deviations: Step-to pattern, Decreased step length - right, Decreased step length - left, Shuffle, Trunk flexed Gait velocity: decr     General Gait Details: cues for posture, position from RW and initial sequence  Stairs            Wheelchair Mobility    Modified Rankin (Stroke Patients Only)       Balance Overall balance assessment: Needs assistance Sitting-balance support: No upper extremity supported, Feet supported Sitting balance-Leahy Scale: Good     Standing balance support: Single extremity supported Standing balance-Leahy Scale: Poor                               Pertinent Vitals/Pain Pain Assessment Pain Assessment: 0-10 Pain Score: 5  Pain Location: L knee Pain Descriptors / Indicators: Aching, Sore Pain Intervention(s): Limited activity within patient's tolerance, Monitored during session, Premedicated before session, Ice applied    Home Living Family/patient expects to be discharged to:: Private residence Living Arrangements: Alone Available Help at Discharge: Friend(s);Available 24 hours/day Type of Home: House Home Access: Level entry       Home Layout: One level Home Equipment: Conservation officer, nature (2 wheels)      Prior Function Prior Level of  Function : Independent/Modified Independent                     Hand Dominance        Extremity/Trunk Assessment   Upper Extremity Assessment Upper Extremity Assessment: Overall WFL for tasks assessed    Lower Extremity Assessment Lower Extremity  Assessment: LLE deficits/detail LLE Deficits / Details: 3-/5 quads with AAROM at knee -5 - 60    Cervical / Trunk Assessment Cervical / Trunk Assessment: Normal  Communication   Communication: No difficulties  Cognition Arousal/Alertness: Awake/alert Behavior During Therapy: WFL for tasks assessed/performed Overall Cognitive Status: Within Functional Limits for tasks assessed                                          General Comments      Exercises Total Joint Exercises Ankle Circles/Pumps: AROM, Both, 15 reps, Supine Quad Sets: AROM, Both, 10 reps, Supine Heel Slides: AAROM, Left, 15 reps, Supine Straight Leg Raises: AAROM, AROM, Left, 10 reps, Supine   Assessment/Plan    PT Assessment Patient needs continued PT services  PT Problem List Decreased strength;Decreased range of motion;Decreased activity tolerance;Decreased balance;Decreased mobility;Decreased knowledge of use of DME;Pain       PT Treatment Interventions DME instruction;Gait training;Stair training;Functional mobility training;Therapeutic activities;Therapeutic exercise;Patient/family education    PT Goals (Current goals can be found in the Care Plan section)  Acute Rehab PT Goals Patient Stated Goal: Regain IND PT Goal Formulation: With patient Time For Goal Achievement: 10/03/22 Potential to Achieve Goals: Good    Frequency 7X/week     Co-evaluation               AM-PAC PT "6 Clicks" Mobility  Outcome Measure Help needed turning from your back to your side while in a flat bed without using bedrails?: A Little Help needed moving from lying on your back to sitting on the side of a flat bed without using bedrails?: A Little Help needed moving to and from a bed to a chair (including a wheelchair)?: A Little Help needed standing up from a chair using your arms (e.g., wheelchair or bedside chair)?: A Little Help needed to walk in hospital room?: A Little Help needed climbing 3-5  steps with a railing? : A Little 6 Click Score: 18    End of Session Equipment Utilized During Treatment: Gait belt Activity Tolerance: Patient tolerated treatment well Patient left: in chair;with call bell/phone within reach;with chair alarm set;with family/visitor present Nurse Communication: Mobility status PT Visit Diagnosis: Difficulty in walking, not elsewhere classified (R26.2)    Time: 8850-2774 PT Time Calculation (min) (ACUTE ONLY): 47 min   Charges:   PT Evaluation $PT Eval Low Complexity: 1 Low PT Treatments $Gait Training: 8-22 mins $Therapeutic Exercise: 8-22 mins        Richard Harding PT Acute Rehabilitation Services Pager [phone] Office [phone]   Richard Harding 09/26/2022, 12:35 PM

## 2022-09-27 DIAGNOSIS — I10 Essential (primary) hypertension: Secondary | ICD-10-CM | POA: Diagnosis not present

## 2022-09-27 DIAGNOSIS — E669 Obesity, unspecified: Secondary | ICD-10-CM | POA: Diagnosis not present

## 2022-09-27 DIAGNOSIS — Z7984 Long term (current) use of oral hypoglycemic drugs: Secondary | ICD-10-CM | POA: Diagnosis not present

## 2022-09-27 DIAGNOSIS — E785 Hyperlipidemia, unspecified: Secondary | ICD-10-CM | POA: Diagnosis not present

## 2022-09-27 DIAGNOSIS — Z9181 History of falling: Secondary | ICD-10-CM | POA: Diagnosis not present

## 2022-09-27 DIAGNOSIS — Z96651 Presence of right artificial knee joint: Secondary | ICD-10-CM | POA: Diagnosis not present

## 2022-09-27 DIAGNOSIS — Z981 Arthrodesis status: Secondary | ICD-10-CM | POA: Diagnosis not present

## 2022-09-27 DIAGNOSIS — I4510 Unspecified right bundle-branch block: Secondary | ICD-10-CM | POA: Diagnosis not present

## 2022-09-27 DIAGNOSIS — M1990 Unspecified osteoarthritis, unspecified site: Secondary | ICD-10-CM | POA: Diagnosis not present

## 2022-09-27 DIAGNOSIS — Z6830 Body mass index (BMI) 30.0-30.9, adult: Secondary | ICD-10-CM | POA: Diagnosis not present

## 2022-09-27 DIAGNOSIS — Z96652 Presence of left artificial knee joint: Secondary | ICD-10-CM | POA: Diagnosis not present

## 2022-09-27 DIAGNOSIS — E1151 Type 2 diabetes mellitus with diabetic peripheral angiopathy without gangrene: Secondary | ICD-10-CM | POA: Diagnosis not present

## 2022-09-27 DIAGNOSIS — Z471 Aftercare following joint replacement surgery: Secondary | ICD-10-CM | POA: Diagnosis not present

## 2022-09-28 ENCOUNTER — Telehealth: Payer: Self-pay

## 2022-09-28 ENCOUNTER — Other Ambulatory Visit: Payer: Self-pay

## 2022-09-28 ENCOUNTER — Encounter (HOSPITAL_COMMUNITY): Payer: Self-pay | Admitting: Orthopaedic Surgery

## 2022-09-28 ENCOUNTER — Other Ambulatory Visit: Payer: Self-pay | Admitting: Nurse Practitioner

## 2022-09-28 DIAGNOSIS — Z789 Other specified health status: Secondary | ICD-10-CM

## 2022-09-28 NOTE — Telephone Encounter (Signed)
Transition Care Management Follow-up Telephone Call Date of discharge and from where: 09/26/22 [facility] How have you been since you were released from the hospital? I am in a lot of pain, I am miserable. Any questions or concerns? Yes, if I could get a nurse to come to my home at least once a week, that would be good.  Items Reviewed: Did the pt receive and understand the discharge instructions provided? Yes  Medications obtained and verified? Yes  Other? No  Any new allergies since your discharge? No  Dietary orders reviewed? No Do you have support at home? Yes   Home Care and Equipment/Supplies: Were home health services ordered? not applicable If so, what is the name of the agency? N/a  Has the agency set up a time to come to the patient's home? not applicable Were any new equipment or medical supplies ordered?  No What is the name of the medical supply agency? N/a Were you able to get the supplies/equipment? not applicable Do you have any questions related to the use of the equipment or supplies? No  Functional Questionnaire: (I = Independent and D = Dependent) ADLs: I  Bathing/Dressing- I  Meal Prep- I  Eating- I  Maintaining continence- I  Transferring/Ambulation- I  Managing Meds- I  Follow up appointments reviewed:  PCP Hospital f/u appt confirmed? Yes  Scheduled to see Vance Peper on 10/08/22 @ 11:20am. [facility] f/u appt confirmed? Yes  Scheduled to see Dr. Ninfa Linden on 10/08/22 @ 2:15pm. Are transportation arrangements needed? No  If their condition worsens, is the pt aware to call PCP or go to the Emergency Dept.? Yes Was the patient provided with contact information for the PCP's office or ED? Yes Was to pt encouraged to call back with questions or concerns? Yes  Angeline Slim, RN, BSN

## 2022-09-29 ENCOUNTER — Other Ambulatory Visit: Payer: Self-pay | Admitting: Orthopaedic Surgery

## 2022-09-29 ENCOUNTER — Telehealth: Payer: Self-pay | Admitting: Orthopaedic Surgery

## 2022-09-29 MED ORDER — OXYCODONE HCL 5 MG PO TABS: 5.0000 mg | ORAL_TABLET | Freq: Four times a day (QID) | ORAL | 0 refills | Status: DC | PRN

## 2022-09-29 MED ORDER — METHOCARBAMOL 500 MG PO TABS: 500.0000 mg | ORAL_TABLET | Freq: Four times a day (QID) | ORAL | 1 refills | Status: DC | PRN

## 2022-09-29 NOTE — Telephone Encounter (Signed)
Patient's care giver called. Izora Gala. She says patient needs a refill on oxycodone and robaxin. [phone] is her phone numer

## 2022-10-05 ENCOUNTER — Ambulatory Visit (INDEPENDENT_AMBULATORY_CARE_PROVIDER_SITE_OTHER): Payer: Medicare Other | Admitting: Nurse Practitioner

## 2022-10-05 ENCOUNTER — Other Ambulatory Visit: Payer: Self-pay

## 2022-10-05 ENCOUNTER — Encounter: Payer: Self-pay | Admitting: Nurse Practitioner

## 2022-10-05 VITALS — BP 158/78 | HR 96 | Temp 97.5°F | Wt 188.2 lb

## 2022-10-05 DIAGNOSIS — M7989 Other specified soft tissue disorders: Secondary | ICD-10-CM

## 2022-10-05 DIAGNOSIS — Z96652 Presence of left artificial knee joint: Secondary | ICD-10-CM | POA: Diagnosis not present

## 2022-10-05 HISTORY — DX: Other specified soft tissue disorders: M79.89

## 2022-10-05 MED ORDER — SULFAMETHOXAZOLE-TRIMETHOPRIM 800-160 MG PO TABS: 1.0000 | ORAL_TABLET | Freq: Two times a day (BID) | ORAL | 0 refills | Status: DC

## 2022-10-05 MED ORDER — OXYCODONE HCL 5 MG PO TABS: 5.0000 mg | ORAL_TABLET | Freq: Four times a day (QID) | ORAL | 0 refills | Status: DC | PRN

## 2022-10-05 NOTE — Patient Instructions (Signed)
It was great to see you!  I have ordered an ultrasound of your left leg. It will be done at: [facility] Address: [address], [place], [place] [address] Phone: [phone]  Start bactrim antibiotic twice a day with food.   Let's follow-up in 2 months, sooner if you have concerns.  If a referral was placed today, you will be contacted for an appointment. Please note that routine referrals can sometimes take up to 3-4 weeks to process. Please call our office if you haven't heard anything after this time frame.  Take care,  Vance Peper, NP

## 2022-10-05 NOTE — Assessment & Plan Note (Addendum)
He had a left knee replacement done on 09/25/2022.  He has a follow-up appointment scheduled with orthopedics on 10/08/2022.  The incision is approximated with staples.  He does have some redness and warmth on the lateral side of his incision.  Concern for start of infection, will start Bactrim 1 tablet twice a day for 10 days.  He is also having some blisters where the tape was for his dressing.  Encouraged him to try to leave these intact and if they do rupture, use triple antibiotic ointment on it daily.  He has physical therapy coming out to his house, however he would be interested in a nurse coming out as well.  Order placed for home health RN for evaluation.  With ongoing pain, will refill oxycodone to take every 6 hours as needed.  He can also continue to alternate the Tylenol and ibuprofen.

## 2022-10-05 NOTE — Assessment & Plan Note (Signed)
He is having left leg swelling 3+ pitting edema to his foot up to his knee.  This could be from surgery, however with some redness, will check an ultrasound to rule out DVT.  Pulses palpable, skin is warm.  He also brought some compression socks to his appointment.  One was placed on his left leg, encouraged him to take this off at night and he can wear it during the day.  He can also prop his leg up while he is sitting.  Keep his appointment with orthopedics in 3 days.

## 2022-10-05 NOTE — Progress Notes (Signed)
Established Patient Office Visit  Subjective   Patient ID: Richard Harding, male    DOB: 1946-08-26  Age: 76 y.o. MRN: [id number]  Chief Complaint  Patient presents with   Follow-up    Hosp f/u     HPI  Richard Harding is here to follow-up after total knee replacement on 09/25/2022.  He states that he was having some itching and irritation to the bandage and tape, he called the orthopedic and was told to take the dressing off.  He noted that he has blisters and a skin tear on the skin around the incision.  He is still having ongoing pain, he only has 1 oxycodone tablet left.  He has also noticed some swelling and purple discoloration to his left foot.  He denies numbness, changes in sensation.  He has been working with physical therapy at home, however he is wondering if it would be able to have a nurse come out to his house.  He has a friend who has been helping take care of him, however she leaves on Friday.  He denies fevers, chest pain, shortness of breath..    ROS See pertinent positives and negatives per HPI.    Objective:     BP (!) 158/78   Pulse 96   Temp (!) 97.5 F (36.4 C) (Temporal)   Wt 188 lb 3.2 oz (85.4 kg)   SpO2 96%   BMI 28.62 kg/m    Physical Exam Vitals and nursing note reviewed.  Constitutional:      Appearance: Normal appearance.  HENT:     Head: Normocephalic.  Eyes:     Conjunctiva/sclera: Conjunctivae normal.  Cardiovascular:     Rate and Rhythm: Normal rate and regular rhythm.     Pulses: Normal pulses.     Heart sounds: Normal heart sounds.  Pulmonary:     Effort: Pulmonary effort is normal.     Breath sounds: Normal breath sounds.  Musculoskeletal:     Cervical back: Normal range of motion.     Left lower leg: Edema (3+ pitting from foot to knee) present.  Skin:    General: Skin is warm.     Findings: Bruising (left foot) present.     Comments: Several fluid filled blisters around incision. Incision approximated with  staples. Scabbed area to left lateral knee  Neurological:     General: No focal deficit present.     Mental Status: He is alert and oriented to person, place, and time.  Psychiatric:        Mood and Affect: Mood normal.        Behavior: Behavior normal.        Thought Content: Thought content normal.        Judgment: Judgment normal.     The 10-year ASCVD risk score (Arnett DK, et al., 2019) is: 60.5%    Assessment & Plan:   Problem List Items Addressed This Visit       Other   History of left knee replacement    He had a left knee replacement done on 09/25/2022.  He has a follow-up appointment scheduled with orthopedics on 10/08/2022.  The incision is approximated with staples.  He does have some redness and warmth on the lateral side of his incision.  Concern for start of infection, will start Bactrim 1 tablet twice a day for 10 days.  He is also having some blisters where the tape was for his dressing.  Encouraged him to try  to leave these intact and if they do rupture, use triple antibiotic ointment on it daily.  He has physical therapy coming out to his house, however he would be interested in a nurse coming out as well.  Order placed for home health RN for evaluation.  With ongoing pain, will refill oxycodone to take every 6 hours as needed.  He can also continue to alternate the Tylenol and ibuprofen.      Relevant Orders   Ambulatory referral to [place]   Left leg swelling - Primary    He is having left leg swelling 3+ pitting edema to his foot up to his knee.  This could be from surgery, however with some redness, will check an ultrasound to rule out DVT.  Pulses palpable, skin is warm.  He also brought some compression socks to his appointment.  One was placed on his left leg, encouraged him to take this off at night and he can wear it during the day.  He can also prop his leg up while he is sitting.  Keep his appointment with orthopedics in 3 days.      Relevant Orders    VAS [place] LOWER EXTREMITY VENOUS (DVT)   Ambulatory referral to Home Health    Return in about 2 months (around 12/05/2022).    Charyl Dancer, NP

## 2022-10-08 ENCOUNTER — Ambulatory Visit (INDEPENDENT_AMBULATORY_CARE_PROVIDER_SITE_OTHER): Payer: Medicare Other | Admitting: Orthopaedic Surgery

## 2022-10-08 ENCOUNTER — Inpatient Hospital Stay: Payer: Medicare Other | Admitting: Nurse Practitioner

## 2022-10-08 ENCOUNTER — Encounter: Payer: Self-pay | Admitting: Orthopaedic Surgery

## 2022-10-08 DIAGNOSIS — Z96652 Presence of left artificial knee joint: Secondary | ICD-10-CM

## 2022-10-08 MED ORDER — OXYCODONE HCL 5 MG PO TABS: 5.0000 mg | ORAL_TABLET | Freq: Four times a day (QID) | ORAL | 0 refills | Status: DC | PRN

## 2022-10-08 NOTE — Progress Notes (Signed)
The patient comes in today for his first postoperative visit status post a left total knee arthroplasty.  The notes from therapy stated that he is already been able to flex to 96 degrees which is great.  He has had a history of a right knee replacement.  He is an active 76 year old gentleman.  He does need a refill on pain medications.  Denies any calf pain.  He did have a lot of blistering around his knee due to overwrapping the knee had a concern about bleeding.  The staples were removed and Steri-Strips applied.  Overall there is no evidence of infection.  The blister areas are healing nicely.  He will transition outpatient physical therapy next week.  He already has appointment for that.  I will send in some more oxycodone for him and when he runs low he will let us know.  We will see him back in 4 weeks to see how he is doing overall but no x-rays are needed.  If there are issues before then he knows to let us know.

## 2022-10-12 ENCOUNTER — Other Ambulatory Visit: Payer: Self-pay | Admitting: Nurse Practitioner

## 2022-10-12 DIAGNOSIS — E1151 Type 2 diabetes mellitus with diabetic peripheral angiopathy without gangrene: Secondary | ICD-10-CM

## 2022-10-13 ENCOUNTER — Ambulatory Visit: Payer: Medicare Other | Admitting: Physical Therapy

## 2022-10-13 NOTE — Therapy (Signed)
OUTPATIENT PHYSICAL THERAPY LOWER EXTREMITY EVALUATION   Patient Name: Richard Harding MRN: [id number] DOB:1946/05/18, 76 y.o., male Today's Date: 10/14/2022   PT End of Session - 10/14/22 1527     Visit Number 1    PT Start Time 18             Past Medical History:  Diagnosis Date   Arthritis    Arthritis of right knee 04/25/2020   Carpal tunnel syndrome, bilateral    Chickenpox    Diabetes mellitus without complication ([facility])    Hyperlipemia    Hypertension    RBBB 04/17/2020   Noted on EKG   Past Surgical History:  Procedure Laterality Date   BILATERAL CARPAL TUNNEL RELEASE     CERVICAL FUSION     COLONOSCOPY  2011   TOTAL KNEE ARTHROPLASTY Right 04/26/2020   Procedure: RIGHT TOTAL KNEE ARTHROPLASTY;  Surgeon: Mcarthur Rossetti, MD;  Location: [facility];  Service: Orthopedics;  Laterality: Right;   TOTAL KNEE ARTHROPLASTY Left 09/25/2022   Procedure: LEFT TOTAL KNEE ARTHROPLASTY;  Surgeon: Mcarthur Rossetti, MD;  Location: [facility];  Service: Orthopedics;  Laterality: Left;   Patient Active Problem List   Diagnosis Date Noted   Left leg swelling 10/05/2022   History of left knee replacement 09/25/2022   Chest pain 05/26/2022   Unilateral primary osteoarthritis, left knee 04/08/2022   Arthritis of left knee 03/26/2022   Rash 03/26/2022   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/26/2020   Hypokalemia 01/12/2019   DM (diabetes mellitus) type II, controlled, with peripheral vascular disorder ([place]) 09/16/2017   Essential hypertension 01/18/2014   Hyperlipidemia LDL goal <70 01/18/2014   Obesity (BMI 30-39.9) 01/18/2014    PCP: Vance Peper  REFERRING PROVIDER: Zollie Beckers  REFERRING DIAG: [phone]  THERAPY DIAG:  Acute pain of left knee  Stiffness of left knee, not elsewhere classified  Muscle weakness (generalized)  Other abnormalities of gait and mobility  Rationale for Evaluation and Treatment Rehabilitation  ONSET DATE:  09/25/22  SUBJECTIVE:   SUBJECTIVE STATEMENT: I had therapy in the hospital right after and I had some bleeding under my bandage and it was hell going through to change it. Then I had therapy at home it was okay.   PERTINENT HISTORY: Left TKA 09/25/2022 Right TKA 04/2020  PAIN:  Are you having pain? Yes: NPRS scale: 5/10 Pain location: L knee sometimes my calf and thigh muscles hurt Pain description: sharp, soreness Aggravating factors: twisting it when I am sleeping Relieving factors: heavy doses of opiods  PRECAUTIONS: None  WEIGHT BEARING RESTRICTIONS: No  FALLS:  Has patient fallen in last 6 months? No  LIVING ENVIRONMENT: Lives with: lives alone Lives in: House/apartment Stairs: Yes: External: 2 steps; on right going up Has following equipment at home: Single point cane  OCCUPATION: Retired  PLOF: Independent  PATIENT GOALS: walk with normal gait pattern, no pain    OBJECTIVE:   DIAGNOSTIC FINDINGS: FINDINGS: Status post left total knee arthroplasty. The hardware is well positioned. There is no evidence of acute fracture or dislocation. Anterior skin staples are in place. There is a small amount of gas in the joint and surrounding soft tissues. No unexpected foreign bodies.   IMPRESSION: Status post left total knee arthroplasty without demonstrated complication.    PATIENT SURVEYS:  FOTO 42  COGNITION: Overall cognitive status: Within functional limits for tasks assessed     SENSATION: WFL   MUSCLE LENGTH: Hamstrings: tightness bilaterally more in LLE  POSTURE: rounded shoulders and forward head  PALPATION: Swelling and warm around L knee  LOWER EXTREMITY ROM:  Active ROM Right eval Left eval  Hip flexion    Hip extension    Hip abduction    Hip adduction    Hip internal rotation    Hip external rotation    Knee flexion  96d  Knee extension  -20d  Ankle dorsiflexion    Ankle plantarflexion    Ankle inversion    Ankle eversion      (Blank rows = not tested)  LOWER EXTREMITY MMT:  MMT Right eval Left eval  Hip flexion 4+ 3+  Hip extension    Hip abduction    Hip adduction    Hip internal rotation    Hip external rotation    Knee flexion 5 3+  Knee extension 5 3  Ankle dorsiflexion    Ankle plantarflexion    Ankle inversion    Ankle eversion     (Blank rows = not tested)   FUNCTIONAL TESTS:  5 times sit to stand: 23.90s Timed up and go (TUG): 19.73s  GAIT: Distance walked: in clinic Assistive device utilized: Single point cane Level of assistance: Modified independence Comments: antalgic gait pattern, decreased stride length, decreased stance time, decreased arm swing    TODAY'S TREATMENT                                                                          DATE:   PATIENT EDUCATION:  Education details: POC and HEP Person educated: Patient Education method: Explanation Education comprehension: verbalized understanding  HOME EXERCISE PROGRAM: Access Code: [email or web address]  Exercises - Supine Heel Slide with Strap  - 1 x daily - 7 x weekly - 2 sets - 10 reps - Supine Quad Set  - 1 x daily - 7 x weekly - 2 sets - 10 reps - Mini Squat with Counter Support  - 1 x daily - 7 x weekly - 2 sets - 10 reps - Heel Raises with Counter Support  - 1 x daily - 7 x weekly - 2 sets - 10 reps  ASSESSMENT:  CLINICAL IMPRESSION: Patient is a 76 y.o. male who was seen today for physical therapy evaluation and treatment status post L total knee surgery. He was getting home health therapy and got some good ranges in his L knee especially into flexion. He is ambulating with a SPC and presents with muscle weakness. Patient is motivated to get better and will benefit from skilled PT intervention to address his L knee pain and strength and range deficits to return to PLOF.     REHAB POTENTIAL: Good  CLINICAL DECISION MAKING: Stable/uncomplicated  EVALUATION COMPLEXITY: Low  GOALS: Goals reviewed with patient?  No  SHORT TERM GOALS: Target date: 11/25/22  Patient will be independent with initial HEP. Goal status: INITIAL    LONG TERM GOALS: Target date: 01/06/23  Patient will be independent with advanced/ongoing HEP to improve outcomes and carryover.  Goal status: INITIAL  2.  Patient will report at least 75% improvement in L knee pain to improve QOL. Baseline: 5/10 Goal status: INITIAL  3.  Patient will demonstrate improved L knee AROM to >/= 5-120 deg to  allow for normal gait and stair mechanics. Baseline: 20-0-120 Goal status: INITIAL  4.  Patient will demonstrate improved functional LE strength as demonstrated by 5/5. Baseline: 3+/5 Goal status: INITIAL  5.  Patient will score TUG and 5xSTS <15s.  Baseline: 23.90s and 19.73s Goal status: INITIAL  6.  Patient will report 5 on FOTO to demonstrate improved functional ability. Baseline: 42 Goal status: INITIAL    PLAN:  PT FREQUENCY: 2x/week  PT DURATION: 12 weeks  PLANNED INTERVENTIONS: Therapeutic exercises, Therapeutic activity, Neuromuscular re-education, Balance training, Gait training, Patient/Family education, Self Care, Joint mobilization, Stair training, Electrical stimulation, Cryotherapy, Vasopneumatic device, Ionotophoresis '4mg'$ /ml Dexamethasone, and Manual therapy  PLAN FOR NEXT SESSION: PROM, AAROM, strengthening for L knee   Andris Baumann, PT 10/14/2022, 4:14 PM

## 2022-10-14 ENCOUNTER — Ambulatory Visit: Payer: Medicare Other | Attending: Orthopaedic Surgery

## 2022-10-14 DIAGNOSIS — Z96652 Presence of left artificial knee joint: Secondary | ICD-10-CM | POA: Insufficient documentation

## 2022-10-14 DIAGNOSIS — M25562 Pain in left knee: Secondary | ICD-10-CM | POA: Diagnosis not present

## 2022-10-14 DIAGNOSIS — M6281 Muscle weakness (generalized): Secondary | ICD-10-CM | POA: Diagnosis not present

## 2022-10-14 DIAGNOSIS — M25662 Stiffness of left knee, not elsewhere classified: Secondary | ICD-10-CM | POA: Diagnosis not present

## 2022-10-14 DIAGNOSIS — R2689 Other abnormalities of gait and mobility: Secondary | ICD-10-CM | POA: Insufficient documentation

## 2022-10-19 ENCOUNTER — Other Ambulatory Visit: Payer: Self-pay | Admitting: Nurse Practitioner

## 2022-10-20 ENCOUNTER — Encounter: Payer: Self-pay | Admitting: Physical Therapy

## 2022-10-20 ENCOUNTER — Ambulatory Visit: Payer: Medicare Other | Admitting: Physical Therapy

## 2022-10-20 DIAGNOSIS — M6281 Muscle weakness (generalized): Secondary | ICD-10-CM | POA: Diagnosis not present

## 2022-10-20 DIAGNOSIS — M25562 Pain in left knee: Secondary | ICD-10-CM

## 2022-10-20 DIAGNOSIS — M25662 Stiffness of left knee, not elsewhere classified: Secondary | ICD-10-CM

## 2022-10-20 DIAGNOSIS — R2689 Other abnormalities of gait and mobility: Secondary | ICD-10-CM | POA: Diagnosis not present

## 2022-10-20 DIAGNOSIS — Z96652 Presence of left artificial knee joint: Secondary | ICD-10-CM | POA: Diagnosis not present

## 2022-10-20 NOTE — Therapy (Signed)
OUTPATIENT PHYSICAL THERAPY LOWER EXTREMITY TREATMENT   Patient Name: Richard Harding MRN: [id number] DOB:11/11/1946, 76 y.o., male Today's Date: 10/20/2022   PT End of Session - 10/20/22 1513     Visit Number 2    Date for PT Re-Evaluation 01/06/23    PT Start Time 1515    PT Stop Time 1610    PT Time Calculation (min) 55 min    Activity Tolerance Patient tolerated treatment well    Behavior During Therapy [facility] for tasks assessed/performed             Past Medical History:  Diagnosis Date   Arthritis    Arthritis of right knee 04/25/2020   Carpal tunnel syndrome, bilateral    Chickenpox    Diabetes mellitus without complication ([place])    Hyperlipemia    Hypertension    RBBB 04/17/2020   Noted on EKG   Past Surgical History:  Procedure Laterality Date   BILATERAL CARPAL TUNNEL RELEASE     CERVICAL FUSION     COLONOSCOPY  2011   TOTAL KNEE ARTHROPLASTY Right 04/26/2020   Procedure: RIGHT TOTAL KNEE ARTHROPLASTY;  Surgeon: Mcarthur Rossetti, MD;  Location: [facility];  Service: Orthopedics;  Laterality: Right;   TOTAL KNEE ARTHROPLASTY Left 09/25/2022   Procedure: LEFT TOTAL KNEE ARTHROPLASTY;  Surgeon: Mcarthur Rossetti, MD;  Location: [facility];  Service: Orthopedics;  Laterality: Left;   Patient Active Problem List   Diagnosis Date Noted   Left leg swelling 10/05/2022   History of left knee replacement 09/25/2022   Chest pain 05/26/2022   Unilateral primary osteoarthritis, left knee 04/08/2022   Arthritis of left knee 03/26/2022   Rash 03/26/2022   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/26/2020   Hypokalemia 01/12/2019   DM (diabetes mellitus) type II, controlled, with peripheral vascular disorder ([place]) 09/16/2017   Essential hypertension 01/18/2014   Hyperlipidemia LDL goal <70 01/18/2014   Obesity (BMI 30-39.9) 01/18/2014    PCP: Vance Peper  REFERRING PROVIDER: Zollie Beckers  REFERRING DIAG: [phone]  THERAPY DIAG:  Acute pain of  left knee  Stiffness of left knee, not elsewhere classified  Muscle weakness (generalized)  Rationale for Evaluation and Treatment Rehabilitation  ONSET DATE: 09/25/22  SUBJECTIVE:   SUBJECTIVE STATEMENT: Having mote pain today, thinks its coming from the humidity  PERTINENT HISTORY: Left TKA 09/25/2022 Right TKA 04/2020  PAIN:  Are you having pain? Yes: NPRS scale: 5/10 Pain location: L knee sometimes my calf and thigh muscles hurt Pain description: sharp, soreness Aggravating factors: twisting it when I am sleeping Relieving factors: heavy doses of opiods  PRECAUTIONS: None  WEIGHT BEARING RESTRICTIONS: No  FALLS:  Has patient fallen in last 6 months? No  LIVING ENVIRONMENT: Lives with: lives alone Lives in: House/apartment Stairs: Yes: External: 2 steps; on right going up Has following equipment at home: Single point cane  OCCUPATION: Retired  PLOF: Independent  PATIENT GOALS: walk with normal gait pattern, no pain    OBJECTIVE:   DIAGNOSTIC FINDINGS: FINDINGS: Status post left total knee arthroplasty. The hardware is well positioned. There is no evidence of acute fracture or dislocation. Anterior skin staples are in place. There is a small amount of gas in the joint and surrounding soft tissues. No unexpected foreign bodies.   IMPRESSION: Status post left total knee arthroplasty without demonstrated complication.    PATIENT SURVEYS:  FOTO 42  COGNITION: Overall cognitive status: Within functional limits for tasks assessed  MUSCLE LENGTH: Hamstrings: tightness bilaterally more in LLE  POSTURE: rounded shoulders and forward head  PALPATION: Swelling and warm around L knee  LOWER EXTREMITY ROM:  Active ROM Right eval Left eval  Hip flexion    Hip extension    Hip abduction    Hip adduction    Hip internal rotation    Hip external rotation    Knee flexion  96d  Knee extension  -20d  Ankle dorsiflexion    Ankle  plantarflexion    Ankle inversion    Ankle eversion     (Blank rows = not tested)  LOWER EXTREMITY MMT:  MMT Right eval Left eval  Hip flexion 4+ 3+  Hip extension    Hip abduction    Hip adduction    Hip internal rotation    Hip external rotation    Knee flexion 5 3+  Knee extension 5 3  Ankle dorsiflexion    Ankle plantarflexion    Ankle inversion    Ankle eversion     (Blank rows = not tested)   FUNCTIONAL TESTS:  5 times sit to stand: 23.90s Timed up and go (TUG): 19.73s  GAIT: Distance walked: in clinic Assistive device utilized: Single point cane Level of assistance: Modified independence Comments: antalgic gait pattern, decreased stride length, decreased stance time, decreased arm swing    TODAY'S TREATMENT       10/20/22 NuStep L4 x 7 min L knee PROM LLE LAQ 2lL 2x10 S2S 2x10 ALt 6in box taps 2x10  4in syep ups LLe 2x10 Heel raises black bar 2x10 Vaso L knee low x10 min  PATIENT EDUCATION:  Education details: POC and HEP Person educated: Patient Education method: Explanation Education comprehension: verbalized understanding  HOME EXERCISE PROGRAM: Access Code: [email or web address]  Exercises - Supine Heel Slide with Strap  - 1 x daily - 7 x weekly - 2 sets - 10 reps - Supine Quad Set  - 1 x daily - 7 x weekly - 2 sets - 10 reps - Mini Squat with Counter Support  - 1 x daily - 7 x weekly - 2 sets - 10 reps - Heel Raises with Counter Support  - 1 x daily - 7 x weekly - 2 sets - 10 reps  ASSESSMENT:  CLINICAL IMPRESSION: Pt enters with Reports of L knee soreness. Despite reports pt did really well evident by no subjective reports of increase pain. Some compensation noted with sit to stands. [place] hold contraction with LAQ. Cue not to pull himself up with heel raises.   REHAB POTENTIAL: Good  CLINICAL DECISION MAKING: Stable/uncomplicated  EVALUATION COMPLEXITY: Low  GOALS: Goals reviewed with patient? No  SHORT TERM GOALS: Target date:  11/25/22  Patient will be independent with initial HEP. Goal status: INITIAL    LONG TERM GOALS: Target date: 01/06/23  Patient will be independent with advanced/ongoing HEP to improve outcomes and carryover.  Goal status: INITIAL  2.  Patient will report at least 75% improvement in L knee pain to improve QOL. Baseline: 5/10 Goal status: INITIAL  3.  Patient will demonstrate improved L knee AROM to >/= 5-120 deg to allow for normal gait and stair mechanics. Baseline: 20-0-120 Goal status: INITIAL  4.  Patient will demonstrate improved functional LE strength as demonstrated by 5/5. Baseline: 3+/5 Goal status: INITIAL  5.  Patient will score TUG and 5xSTS <15s.  Baseline: 23.90s and 19.73s Goal status: INITIAL  6.  Patient will report 47 on FOTO to demonstrate improved  functional ability. Baseline: 42 Goal status: INITIAL    PLAN:  PT FREQUENCY: 2x/week  PT DURATION: 12 weeks  PLANNED INTERVENTIONS: Therapeutic exercises, Therapeutic activity, Neuromuscular re-education, Balance training, Gait training, Patient/Family education, Self Care, Joint mobilization, Stair training, Electrical stimulation, Cryotherapy, Vasopneumatic device, Ionotophoresis '4mg'$ /ml Dexamethasone, and Manual therapy  PLAN FOR NEXT SESSION: PROM, AAROM, strengthening for L knee   Scot Jun, PTA 10/20/2022, 4:01 PM

## 2022-10-22 ENCOUNTER — Encounter: Payer: Self-pay | Admitting: Physical Therapy

## 2022-10-22 ENCOUNTER — Ambulatory Visit: Payer: Medicare Other | Attending: Orthopaedic Surgery | Admitting: Physical Therapy

## 2022-10-22 DIAGNOSIS — R2689 Other abnormalities of gait and mobility: Secondary | ICD-10-CM | POA: Insufficient documentation

## 2022-10-22 DIAGNOSIS — M25562 Pain in left knee: Secondary | ICD-10-CM | POA: Diagnosis not present

## 2022-10-22 DIAGNOSIS — M6281 Muscle weakness (generalized): Secondary | ICD-10-CM | POA: Insufficient documentation

## 2022-10-22 DIAGNOSIS — M25662 Stiffness of left knee, not elsewhere classified: Secondary | ICD-10-CM | POA: Insufficient documentation

## 2022-10-22 NOTE — Therapy (Signed)
OUTPATIENT PHYSICAL THERAPY LOWER EXTREMITY TREATMENT   Patient Name: Richard Harding MRN: [id number] DOB:1946-08-24, 76 y.o., male Today's Date: 10/22/2022   PT End of Session - 10/22/22 1515     Visit Number 3    Date for PT Re-Evaluation 01/06/23    PT Start Time 1515    PT Stop Time 1600    PT Time Calculation (min) 45 min    Activity Tolerance Patient tolerated treatment well    Behavior During Therapy [facility] for tasks assessed/performed             Past Medical History:  Diagnosis Date   Arthritis    Arthritis of right knee 04/25/2020   Carpal tunnel syndrome, bilateral    Chickenpox    Diabetes mellitus without complication ([place])    Hyperlipemia    Hypertension    RBBB 04/17/2020   Noted on EKG   Past Surgical History:  Procedure Laterality Date   BILATERAL CARPAL TUNNEL RELEASE     CERVICAL FUSION     COLONOSCOPY  2011   TOTAL KNEE ARTHROPLASTY Right 04/26/2020   Procedure: RIGHT TOTAL KNEE ARTHROPLASTY;  Surgeon: Mcarthur Rossetti, MD;  Location: [facility];  Service: Orthopedics;  Laterality: Right;   TOTAL KNEE ARTHROPLASTY Left 09/25/2022   Procedure: LEFT TOTAL KNEE ARTHROPLASTY;  Surgeon: Mcarthur Rossetti, MD;  Location: [facility];  Service: Orthopedics;  Laterality: Left;   Patient Active Problem List   Diagnosis Date Noted   Left leg swelling 10/05/2022   History of left knee replacement 09/25/2022   Chest pain 05/26/2022   Unilateral primary osteoarthritis, left knee 04/08/2022   Arthritis of left knee 03/26/2022   Rash 03/26/2022   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/26/2020   Hypokalemia 01/12/2019   DM (diabetes mellitus) type II, controlled, with peripheral vascular disorder ([place]) 09/16/2017   Essential hypertension 01/18/2014   Hyperlipidemia LDL goal <70 01/18/2014   Obesity (BMI 30-39.9) 01/18/2014    PCP: Vance Peper  REFERRING PROVIDER: Zollie Beckers  REFERRING DIAG: [phone]  THERAPY DIAG:  Acute pain of  left knee  Muscle weakness (generalized)  Stiffness of left knee, not elsewhere classified  Other abnormalities of gait and mobility  Rationale for Evaluation and Treatment Rehabilitation  ONSET DATE: 09/25/22  SUBJECTIVE:   SUBJECTIVE STATEMENT: "Pain, Pain, Pain"  pins and needles, difficulty sleeping  PERTINENT HISTORY: Left TKA 09/25/2022 Right TKA 04/2020  PAIN:  Are you having pain? Yes: NPRS scale: 5-6/10 Pain location: L knee sometimes my calf and thigh muscles hurt Pain description: sharp, soreness Aggravating factors: twisting it when I am sleeping Relieving factors: heavy doses of opiods  PRECAUTIONS: None  WEIGHT BEARING RESTRICTIONS: No  FALLS:  Has patient fallen in last 6 months? No  LIVING ENVIRONMENT: Lives with: lives alone Lives in: House/apartment Stairs: Yes: External: 2 steps; on right going up Has following equipment at home: Single point cane  OCCUPATION: Retired  PLOF: Independent  PATIENT GOALS: walk with normal gait pattern, no pain    OBJECTIVE:   DIAGNOSTIC FINDINGS: FINDINGS: Status post left total knee arthroplasty. The hardware is well positioned. There is no evidence of acute fracture or dislocation. Anterior skin staples are in place. There is a small amount of gas in the joint and surrounding soft tissues. No unexpected foreign bodies.   IMPRESSION: Status post left total knee arthroplasty without demonstrated complication.    PATIENT SURVEYS:  FOTO 42  COGNITION: Overall cognitive status: Within functional limits for tasks assessed  MUSCLE LENGTH: Hamstrings: tightness bilaterally more in LLE  POSTURE: rounded shoulders and forward head  PALPATION: Swelling and warm around L knee  LOWER EXTREMITY ROM:  Active ROM Right eval Left eval  Hip flexion    Hip extension    Hip abduction    Hip adduction    Hip internal rotation    Hip external rotation    Knee flexion  96d  Knee extension  -20d   Ankle dorsiflexion    Ankle plantarflexion    Ankle inversion    Ankle eversion     (Blank rows = not tested)  LOWER EXTREMITY MMT:  MMT Right eval Left eval  Hip flexion 4+ 3+  Hip extension    Hip abduction    Hip adduction    Hip internal rotation    Hip external rotation    Knee flexion 5 3+  Knee extension 5 3  Ankle dorsiflexion    Ankle plantarflexion    Ankle inversion    Ankle eversion     (Blank rows = not tested)   FUNCTIONAL TESTS:  5 times sit to stand: 23.90s Timed up and go (TUG): 19.73s  GAIT: Distance walked: in clinic Assistive device utilized: Single point cane Level of assistance: Modified independence Comments: antalgic gait pattern, decreased stride length, decreased stance time, decreased arm swing    TODAY'S TREATMENT       10/22/22 NuStep L3 x 6 min L knee PROM Hamstring curls red 2x15 LLE LAQ 2lb 3x10 S2S 2x10   4 inch step up x 10    6 inch step up x10 Vaso L knee low x10 min  10/20/22 NuStep L4 x 7 min L knee PROM LLE LAQ 2lL 2x10 S2S 2x10 ALt 6in box taps 2x10  4in step ups LLe 2x10 Heel raises black bar 2x10 Vaso L knee low x10 min  PATIENT EDUCATION:  Education details: POC and HEP Person educated: Patient Education method: Explanation Education comprehension: verbalized understanding  HOME EXERCISE PROGRAM: Access Code: [email or web address]  Exercises - Supine Heel Slide with Strap  - 1 x daily - 7 x weekly - 2 sets - 10 reps - Supine Quad Set  - 1 x daily - 7 x weekly - 2 sets - 10 reps - Mini Squat with Counter Support  - 1 x daily - 7 x weekly - 2 sets - 10 reps - Heel Raises with Counter Support  - 1 x daily - 7 x weekly - 2 sets - 10 reps  ASSESSMENT:  CLINICAL IMPRESSION: Again pt enters with Reports of L knee soreness. He has good PROM, cue needed to relax with passive L knee extension. Some compensation noted with sit to stands. Cue to hold contraction with LAQ. Cue not to circumduct LE with step ups.  REHAB  POTENTIAL: Good  CLINICAL DECISION MAKING: Stable/uncomplicated  EVALUATION COMPLEXITY: Low  GOALS: Goals reviewed with patient? No  SHORT TERM GOALS: Target date: 11/25/22  Patient will be independent with initial HEP. Goal status: INITIAL    LONG TERM GOALS: Target date: 01/06/23  Patient will be independent with advanced/ongoing HEP to improve outcomes and carryover.  Goal status: INITIAL  2.  Patient will report at least 75% improvement in L knee pain to improve QOL. Baseline: 5/10 Goal status: INITIAL  3.  Patient will demonstrate improved L knee AROM to >/= 5-120 deg to allow for normal gait and stair mechanics. Baseline: 20-0-120 Goal status: INITIAL  4.  Patient will demonstrate improved functional  LE strength as demonstrated by 5/5. Baseline: 3+/5 Goal status: INITIAL  5.  Patient will score TUG and 5xSTS <15s.  Baseline: 23.90s and 19.73s Goal status: INITIAL  6.  Patient will report 69 on FOTO to demonstrate improved functional ability. Baseline: 42 Goal status: INITIAL    PLAN:  PT FREQUENCY: 2x/week  PT DURATION: 12 weeks  PLANNED INTERVENTIONS: Therapeutic exercises, Therapeutic activity, Neuromuscular re-education, Balance training, Gait training, Patient/Family education, Self Care, Joint mobilization, Stair training, Electrical stimulation, Cryotherapy, Vasopneumatic device, Ionotophoresis '4mg'$ /ml Dexamethasone, and Manual therapy  PLAN FOR NEXT SESSION: PROM, AAROM, strengthening for L knee   Scot Jun, PTA 10/22/2022, 3:16 PM

## 2022-10-26 ENCOUNTER — Telehealth: Payer: Self-pay

## 2022-10-26 NOTE — Progress Notes (Signed)
Chronic Care Management Pharmacy Assistant   Name: Richard Harding  MRN: [id number] DOB: [birth year]  Reason for Encounter: Medication Review/General Adherence Call.   Recent office visits:  10/05/2022 Vance Peper NP (PCP) Start sulfamethoxazole-trimethoprim (BACTRIM DS) 800-160 MG tablet, follow-up in 2 months   Recent consult visits:  10/22/2022 Cheri Fowler PTA (Physical Therapy) No Medication Changes noted 10/20/2022 Cheri Fowler PTA (Physical Therapy) No Medication Changes noted 10/14/2022 Andris Baumann PT (Physical Therapy) No Medication Changes noted 10/08/2022 Dr. Ninfa Linden MD (Orthopedic Surgery) No Medication Changes noted  Hospital visits:  Medication Reconciliation was completed by comparing discharge summary, patient's EMR and Pharmacy list, and upon discussion with patient.  Admitted to the hospital on 09/25/2022 due to Unilateral Primary Osteoarthritis. Discharge date was 09/26/2022. Discharged from [place]?Medications Started at [facility] Discharge:?? -started None ID  Medication Changes at Hospital Discharge: -Changed Aspirin to Chewable tablet  Medications Discontinued at Hospital Discharge: -Stopped   Medications that remain the same after Hospital Discharge:??  -All other medications will remain the same.    Medications: Outpatient Encounter Medications as of 10/26/2022  Medication Sig   Ascorbic Acid (VITAMIN C) 1000 MG tablet Take 1,000 mg by mouth at bedtime.   aspirin 81 MG chewable tablet Chew 1 tablet (81 mg total) by mouth 2 (two) times daily.   atorvastatin (LIPITOR) 10 MG tablet Take 1 tablet (10 mg total) by mouth daily.   b complex vitamins capsule Take 1 capsule by mouth daily.   blood glucose meter kit and supplies KIT Check blood glucose once daily. E11.9.   Calcium Carbonate-Vit D-Min (CALCIUM 1200 PO) Take 1 tablet by mouth 3 (three) times a week.   chlorthalidone (HYGROTON) 25 MG tablet Take 1 tablet (25  mg total) by mouth daily.   Continuous Blood Gluc Sensor (DEXCOM G6 SENSOR) MISC USE AS DIRECTED   Continuous Blood Gluc Transmit (DEXCOM G6 TRANSMITTER) MISC 1 each by Does not apply route continuous.   dapagliflozin propanediol (FARXIGA) 5 MG TABS tablet Take 1 tablet (5 mg total) by mouth daily before breakfast.   fenofibrate 54 MG tablet Take 1 tablet (54 mg total) by mouth daily.   GLUCOSA-CHONDR-NA CHONDR-MSM PO Take 1 capsule by mouth at bedtime.   metFORMIN (GLUCOPHAGE-XR) 500 MG 24 hr tablet TAKE 2 TABLETS BY MOUTH ONCE DAILY WITH BREAKFAST   methocarbamol (ROBAXIN) 500 MG tablet Take 1 tablet (500 mg total) by mouth every 6 (six) hours as needed for muscle spasms.   metoprolol tartrate (LOPRESSOR) 50 MG tablet Take 1 tablet (50 mg total) by mouth 2 (two) times daily. (Patient taking differently: Take 100 mg by mouth at bedtime.)   Multiple Vitamin (MULTIVITAMIN WITH MINERALS) TABS tablet Take 1 tablet by mouth daily.   naproxen sodium (ALEVE) 220 MG tablet Take 220 mg by mouth daily as needed (pain).   omega-3 acid ethyl esters (LOVAZA) 1 g capsule Take 1 capsule (1 g total) by mouth 4 (four) times daily. (Patient taking differently: Take 4 g by mouth at bedtime.)   oxyCODONE (OXY IR/ROXICODONE) 5 MG immediate release tablet Take 1-2 tablets (5-10 mg total) by mouth every 6 (six) hours as needed for moderate pain (pain score 4-6). No more than 6 tablets daily.   polyvinyl alcohol (LIQUIFILM TEARS) 1.4 % ophthalmic solution Place 1 drop into both eyes as needed for dry eyes.   Potassium Bicarbonate 99 MG CAPS Take 99 mg by mouth daily.   potassium chloride SA (KLOR-CON  M) 20 MEQ tablet Take 1 tablet (20 mEq total) by mouth 2 (two) times daily.   Prenatal Vit-Fe Fumarate-FA (PRENATAL VITAMIN PO) Take 1 tablet by mouth daily.   Red Yeast Rice 600 MG CAPS Take 1 capsule (600 mg total) by mouth daily.   sulfamethoxazole-trimethoprim (BACTRIM DS) 800-160 MG tablet Take 1 tablet by mouth 2 (two)  times daily.   triamcinolone ointment (KENALOG) 0.1 % Apply 1 Application topically 2 (two) times daily as needed for rash.   No facility-administered encounter medications on file as of 10/26/2022.    Care Gaps: COVID-19 Vaccine Influenza Vaccine HTN: 158/78 on 10/05/2022   Star Rating Drugs: Atorvastatin 10 mg last filled 08/27/2022 for 90 day supply at [facility]. Metformin 500 mg last filled 10/20/2022 for 90 day supply at [facility].  Called patient and discussed medication adherence  with patient, no issues at this time with current medication.   Patient Reports ED visit since his last CPP follow up.   Patient Denies  any side effects with his medication.  Patient Denies  any problems with his current pharmacy  Patient states he is in a lot of pain from his knee surgery he recently had.Patient reports he takes Oxycodone 5 mg one tablet in the morning and one tablet  in the evening,and  he takes two tablets of  Aleve 220 mg in the morning and two tablets of Aleve 220 mg in the evening totaling to be 4 in 24 hours.Patient is asking if this is okay to take, or will this hurt his liver? Notified Clinical pharmacist.   Per Clinical pharmacist, I would recommend using minimal doses of Aleve possible, certainly no more than 4 daily. He can also alternate with using tylenol and Aleve as necessary to manage his inflammation and pain.   [place] Pharmacist Assistant [phone]

## 2022-10-27 ENCOUNTER — Encounter: Payer: Self-pay | Admitting: Physical Therapy

## 2022-10-27 ENCOUNTER — Ambulatory Visit: Payer: Medicare Other | Admitting: Physical Therapy

## 2022-10-27 DIAGNOSIS — M6281 Muscle weakness (generalized): Secondary | ICD-10-CM

## 2022-10-27 DIAGNOSIS — R2689 Other abnormalities of gait and mobility: Secondary | ICD-10-CM | POA: Diagnosis not present

## 2022-10-27 DIAGNOSIS — M25562 Pain in left knee: Secondary | ICD-10-CM | POA: Diagnosis not present

## 2022-10-27 DIAGNOSIS — M25662 Stiffness of left knee, not elsewhere classified: Secondary | ICD-10-CM

## 2022-10-27 NOTE — Therapy (Signed)
OUTPATIENT PHYSICAL THERAPY LOWER EXTREMITY TREATMENT   Patient Name: Richard Harding MRN: [id number] DOB:01/25/1946, 76 y.o., male Today's Date: 10/27/2022   PT End of Session - 10/27/22 1516     Visit Number 4    Date for PT Re-Evaluation 01/06/23    PT Start Time 1515    PT Stop Time 1605    PT Time Calculation (min) 50 min    Activity Tolerance Patient tolerated treatment well    Behavior During Therapy [facility] for tasks assessed/performed             Past Medical History:  Diagnosis Date   Arthritis    Arthritis of right knee 04/25/2020   Carpal tunnel syndrome, bilateral    Chickenpox    Diabetes mellitus without complication ([place])    Hyperlipemia    Hypertension    RBBB 04/17/2020   Noted on EKG   Past Surgical History:  Procedure Laterality Date   BILATERAL CARPAL TUNNEL RELEASE     CERVICAL FUSION     COLONOSCOPY  2011   TOTAL KNEE ARTHROPLASTY Right 04/26/2020   Procedure: RIGHT TOTAL KNEE ARTHROPLASTY;  Surgeon: Mcarthur Rossetti, MD;  Location: [facility];  Service: Orthopedics;  Laterality: Right;   TOTAL KNEE ARTHROPLASTY Left 09/25/2022   Procedure: LEFT TOTAL KNEE ARTHROPLASTY;  Surgeon: Mcarthur Rossetti, MD;  Location: [facility];  Service: Orthopedics;  Laterality: Left;   Patient Active Problem List   Diagnosis Date Noted   Left leg swelling 10/05/2022   History of left knee replacement 09/25/2022   Chest pain 05/26/2022   Unilateral primary osteoarthritis, left knee 04/08/2022   Arthritis of left knee 03/26/2022   Rash 03/26/2022   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/26/2020   Hypokalemia 01/12/2019   DM (diabetes mellitus) type II, controlled, with peripheral vascular disorder ([place]) 09/16/2017   Essential hypertension 01/18/2014   Hyperlipidemia LDL goal <70 01/18/2014   Obesity (BMI 30-39.9) 01/18/2014    PCP: Vance Peper  REFERRING PROVIDER: Zollie Beckers  REFERRING DIAG: [phone]  THERAPY DIAG:  Acute pain of  left knee  Muscle weakness (generalized)  Stiffness of left knee, not elsewhere classified  Rationale for Evaluation and Treatment Rehabilitation  ONSET DATE: 09/25/22  SUBJECTIVE:   SUBJECTIVE STATEMENT: "So,so"  PERTINENT HISTORY: Left TKA 09/25/2022 Right TKA 04/2020  PAIN:  Are you having pain? Yes: NPRS scale: 3-5/10 Pain location: L knee sometimes my calf and thigh muscles hurt Pain description: sharp, soreness Aggravating factors: twisting it when I am sleeping Relieving factors: heavy doses of opiods  PRECAUTIONS: None  WEIGHT BEARING RESTRICTIONS: No  FALLS:  Has patient fallen in last 6 months? No  LIVING ENVIRONMENT: Lives with: lives alone Lives in: House/apartment Stairs: Yes: External: 2 steps; on right going up Has following equipment at home: Single point cane  OCCUPATION: Retired  PLOF: Independent  PATIENT GOALS: walk with normal gait pattern, no pain    OBJECTIVE:   DIAGNOSTIC FINDINGS: FINDINGS: Status post left total knee arthroplasty. The hardware is well positioned. There is no evidence of acute fracture or dislocation. Anterior skin staples are in place. There is a small amount of gas in the joint and surrounding soft tissues. No unexpected foreign bodies.   IMPRESSION: Status post left total knee arthroplasty without demonstrated complication.    PATIENT SURVEYS:  FOTO 42  COGNITION: Overall cognitive status: Within functional limits for tasks assessed        MUSCLE LENGTH: Hamstrings: tightness bilaterally more in LLE  POSTURE: rounded shoulders and forward head  PALPATION: Swelling and warm around L knee  LOWER EXTREMITY ROM:  Active ROM Right eval Left eval Left  10/27/22  Hip flexion     Hip extension     Hip abduction     Hip adduction     Hip internal rotation     Hip external rotation     Knee flexion  96d 108  Knee extension  -20d 14  Ankle dorsiflexion     Ankle plantarflexion     Ankle  inversion     Ankle eversion      (Blank rows = not tested)  LOWER EXTREMITY MMT:  MMT Right eval Left eval  Hip flexion 4+ 3+  Hip extension    Hip abduction    Hip adduction    Hip internal rotation    Hip external rotation    Knee flexion 5 3+  Knee extension 5 3  Ankle dorsiflexion    Ankle plantarflexion    Ankle inversion    Ankle eversion     (Blank rows = not tested)   FUNCTIONAL TESTS:  5 times sit to stand: 23.90s Timed up and go (TUG): 19.73s  GAIT: Distance walked: in clinic Assistive device utilized: Single point cane Level of assistance: Modified independence Comments: antalgic gait pattern, decreased stride length, decreased stance time, decreased arm swing    TODAY'S TREATMENT       10/27/22 NuStep L5 x 6 min  LAQ 3lb 2x15 Hamstring curls red 2x15 S2S holding yellow then blue ball 2x10 Resisted gait 20lb 4 way x 3 each Vaso L knee low x10 min 10/22/22 NuStep L3 x 6 min L knee PROM Hamstring curls red 2x15 LLE LAQ 2lb 3x10 S2S 2x10   4 inch step up x 10    6 inch step up x10 Vaso L knee low x10 min  10/20/22 NuStep L4 x 7 min L knee PROM LLE LAQ 2lL 2x10 S2S 2x10 ALt 6in box taps 2x10  4in step ups LLe 2x10 Heel raises black bar 2x10 Vaso L knee low x10 min  PATIENT EDUCATION:  Education details: POC and HEP Person educated: Patient Education method: Explanation Education comprehension: verbalized understanding  HOME EXERCISE PROGRAM: Access Code: [email or web address]  Exercises - Supine Heel Slide with Strap  - 1 x daily - 7 x weekly - 2 sets - 10 reps - Supine Quad Set  - 1 x daily - 7 x weekly - 2 sets - 10 reps - Mini Squat with Counter Support  - 1 x daily - 7 x weekly - 2 sets - 10 reps - Heel Raises with Counter Support  - 1 x daily - 7 x weekly - 2 sets - 10 reps  ASSESSMENT:  CLINICAL IMPRESSION: Pt enters feeling well with low pain rating. He has progressed increasing his L knee AROM in all directions. Cues for equal step  length needed with resisted gait. Cue not to drag LE with resisted side steps  . Some compensation noted with sit to stands. Cue to hold contraction with LAQ.   REHAB POTENTIAL: Good  CLINICAL DECISION MAKING: Stable/uncomplicated  EVALUATION COMPLEXITY: Low  GOALS: Goals reviewed with patient? No  SHORT TERM GOALS: Target date: 11/25/22  Patient will be independent with initial HEP. Goal status: INITIAL    LONG TERM GOALS: Target date: 01/06/23  Patient will be independent with advanced/ongoing HEP to improve outcomes and carryover.  Goal status: INITIAL  2.  Patient will report  at least 75% improvement in L knee pain to improve QOL. Baseline: 5/10 Goal status: INITIAL  3.  Patient will demonstrate improved L knee AROM to >/= 5-120 deg to allow for normal gait and stair mechanics. Baseline: 20-0-120 Goal status: INITIAL  4.  Patient will demonstrate improved functional LE strength as demonstrated by 5/5. Baseline: 3+/5 Goal status: INITIAL  5.  Patient will score TUG and 5xSTS <15s.  Baseline: 23.90s and 19.73s Goal status: INITIAL  6.  Patient will report 22 on FOTO to demonstrate improved functional ability. Baseline: 42 Goal status: INITIAL    PLAN:  PT FREQUENCY: 2x/week  PT DURATION: 12 weeks  PLANNED INTERVENTIONS: Therapeutic exercises, Therapeutic activity, Neuromuscular re-education, Balance training, Gait training, Patient/Family education, Self Care, Joint mobilization, Stair training, Electrical stimulation, Cryotherapy, Vasopneumatic device, Ionotophoresis '4mg'$ /ml Dexamethasone, and Manual therapy  PLAN FOR NEXT SESSION: PROM, AAROM, strengthening for L knee   Scot Jun, PTA 10/27/2022, 4:02 PM

## 2022-10-27 NOTE — Telephone Encounter (Signed)
Caller Name: pt Call back phone #: [phone]   MEDICATION(S):  oxyCODONE (Oxy IR/ROXICODONE) immediate release tablet 5-10 mg   Days of Med Remaining:   Has the patient contacted their pharmacy (YES/NO)no What did pharmacy advise?   Preferred Pharmacy:  [facility], [address] [place], [place] [address] Phone: [phone]  Fax: [phone]   ~~~Please advise patient/caregiver to allow 2-3 business days to process RX refills.   Pt said call him when you call it in

## 2022-10-28 ENCOUNTER — Other Ambulatory Visit: Payer: Self-pay | Admitting: Orthopaedic Surgery

## 2022-10-28 MED ORDER — OXYCODONE HCL 5 MG PO TABS: 5.0000 mg | ORAL_TABLET | Freq: Four times a day (QID) | ORAL | 0 refills | Status: DC | PRN

## 2022-10-28 NOTE — Therapy (Signed)
OUTPATIENT PHYSICAL THERAPY LOWER EXTREMITY TREATMENT   Patient Name: Richard Harding MRN: [id number] DOB:Sep 27, 1946, 76 y.o., male Today's Date: 10/29/2022   PT End of Session - 10/29/22 1516     Visit Number 5    Date for PT Re-Evaluation 01/06/23    PT Start Time 1510    PT Stop Time 1548    PT Time Calculation (min) 38 min    Activity Tolerance Patient tolerated treatment well    Behavior During Therapy [facility] for tasks assessed/performed              Past Medical History:  Diagnosis Date   Arthritis    Arthritis of right knee 04/25/2020   Carpal tunnel syndrome, bilateral    Chickenpox    Diabetes mellitus without complication ([place])    Hyperlipemia    Hypertension    RBBB 04/17/2020   Noted on EKG   Past Surgical History:  Procedure Laterality Date   BILATERAL CARPAL TUNNEL RELEASE     CERVICAL FUSION     COLONOSCOPY  2011   TOTAL KNEE ARTHROPLASTY Right 04/26/2020   Procedure: RIGHT TOTAL KNEE ARTHROPLASTY;  Surgeon: Mcarthur Rossetti, MD;  Location: [facility];  Service: Orthopedics;  Laterality: Right;   TOTAL KNEE ARTHROPLASTY Left 09/25/2022   Procedure: LEFT TOTAL KNEE ARTHROPLASTY;  Surgeon: Mcarthur Rossetti, MD;  Location: [facility];  Service: Orthopedics;  Laterality: Left;   Patient Active Problem List   Diagnosis Date Noted   Left leg swelling 10/05/2022   History of left knee replacement 09/25/2022   Chest pain 05/26/2022   Unilateral primary osteoarthritis, left knee 04/08/2022   Arthritis of left knee 03/26/2022   Rash 03/26/2022   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/26/2020   Hypokalemia 01/12/2019   DM (diabetes mellitus) type II, controlled, with peripheral vascular disorder ([place]) 09/16/2017   Essential hypertension 01/18/2014   Hyperlipidemia LDL goal <70 01/18/2014   Obesity (BMI 30-39.9) 01/18/2014    PCP: Vance Peper  REFERRING PROVIDER: Zollie Beckers  REFERRING DIAG: [phone]  THERAPY DIAG:  Acute pain of  left knee  Muscle weakness (generalized)  Stiffness of left knee, not elsewhere classified  Other abnormalities of gait and mobility  Rationale for Evaluation and Treatment Rehabilitation  ONSET DATE: 09/25/22  SUBJECTIVE:   SUBJECTIVE STATEMENT: My knee is hurting today, it is a 4-5  PERTINENT HISTORY: Left TKA 09/25/2022 Right TKA 04/2020  PAIN:  Are you having pain? Yes: NPRS scale: 4-5/10 Pain location: L knee sometimes my calf and thigh muscles hurt Pain description: sharp, soreness Aggravating factors: twisting it when I am sleeping Relieving factors: heavy doses of opiods  PRECAUTIONS: None  WEIGHT BEARING RESTRICTIONS: No  FALLS:  Has patient fallen in last 6 months? No  LIVING ENVIRONMENT: Lives with: lives alone Lives in: House/apartment Stairs: Yes: External: 2 steps; on right going up Has following equipment at home: Single point cane  OCCUPATION: Retired  PLOF: Independent  PATIENT GOALS: walk with normal gait pattern, no pain    OBJECTIVE:   DIAGNOSTIC FINDINGS: FINDINGS: Status post left total knee arthroplasty. The hardware is well positioned. There is no evidence of acute fracture or dislocation. Anterior skin staples are in place. There is a small amount of gas in the joint and surrounding soft tissues. No unexpected foreign bodies.   IMPRESSION: Status post left total knee arthroplasty without demonstrated complication.    PATIENT SURVEYS:  FOTO 42  COGNITION: Overall cognitive status: Within functional limits for tasks  assessed        MUSCLE LENGTH: Hamstrings: tightness bilaterally more in LLE  POSTURE: rounded shoulders and forward head  PALPATION: Swelling and warm around L knee  LOWER EXTREMITY ROM:  Active ROM Right eval Left eval Left  10/27/22  Hip flexion     Hip extension     Hip abduction     Hip adduction     Hip internal rotation     Hip external rotation     Knee flexion  96d 108  Knee extension   -20d 14  Ankle dorsiflexion     Ankle plantarflexion     Ankle inversion     Ankle eversion      (Blank rows = not tested)  LOWER EXTREMITY MMT:  MMT Right eval Left eval  Hip flexion 4+ 3+  Hip extension    Hip abduction    Hip adduction    Hip internal rotation    Hip external rotation    Knee flexion 5 3+  Knee extension 5 3  Ankle dorsiflexion    Ankle plantarflexion    Ankle inversion    Ankle eversion     (Blank rows = not tested)   FUNCTIONAL TESTS:  5 times sit to stand: 23.90s Timed up and go (TUG): 19.73s  GAIT: Distance walked: in clinic Assistive device utilized: Single point cane Level of assistance: Modified independence Comments: antalgic gait pattern, decreased stride length, decreased stance time, decreased arm swing    TODAY'S TREATMENT       10/29/22 NuStep L5 x65mns LE only  PROM flex/ext  LAQ 3# 2x10 HS curls greenTB 2x10  S2S on airex 2x10 Step ups 6" x10 each leg  Vaso med x10 mins   10/27/22 NuStep L5 x 6 min  LAQ 3lb 2x15 Hamstring curls red 2x15 S2S holding yellow then blue ball 2x10 Resisted gait 20lb 4 way x 3 each Vaso L knee low x10 min   10/22/22 NuStep L3 x 6 min L knee PROM Hamstring curls red 2x15 LLE LAQ 2lb 3x10 S2S 2x10   4 inch step up x 10    6 inch step up x10 Vaso L knee low x10 min  10/20/22 NuStep L4 x 7 min L knee PROM LLE LAQ 2lL 2x10 S2S 2x10 ALt 6in box taps 2x10  4in step ups LLe 2x10 Heel raises black bar 2x10 Vaso L knee low x10 min  PATIENT EDUCATION:  Education details: POC and HEP Person educated: Patient Education method: Explanation Education comprehension: verbalized understanding  HOME EXERCISE PROGRAM: Access Code: [email or web address]  Exercises - Supine Heel Slide with Strap  - 1 x daily - 7 x weekly - 2 sets - 10 reps - Supine Quad Set  - 1 x daily - 7 x weekly - 2 sets - 10 reps - Mini Squat with Counter Support  - 1 x daily - 7 x weekly - 2 sets - 10 reps - Heel Raises with  Counter Support  - 1 x daily - 7 x weekly - 2 sets - 10 reps  ASSESSMENT:  CLINICAL IMPRESSION: Pt arrives ~10 min late to appt, enters with some knee pain. Has progressed with range into extension and flexion. Still walking with antalgic gait pattern. Some compensation noted with sit to stands.   REHAB POTENTIAL: Good  CLINICAL DECISION MAKING: Stable/uncomplicated  EVALUATION COMPLEXITY: Low  GOALS: Goals reviewed with patient? No  SHORT TERM GOALS: Target date: 11/25/22  Patient will be independent with initial HEP.  Goal status: INITIAL    LONG TERM GOALS: Target date: 01/06/23  Patient will be independent with advanced/ongoing HEP to improve outcomes and carryover.  Goal status: INITIAL  2.  Patient will report at least 75% improvement in L knee pain to improve QOL. Baseline: 5/10 Goal status: INITIAL  3.  Patient will demonstrate improved L knee AROM to >/= 5-120 deg to allow for normal gait and stair mechanics. Baseline: 20-0-120 Goal status: INITIAL  4.  Patient will demonstrate improved functional LE strength as demonstrated by 5/5. Baseline: 3+/5 Goal status: INITIAL  5.  Patient will score TUG and 5xSTS <15s.  Baseline: 23.90s and 19.73s Goal status: INITIAL  6.  Patient will report 75 on FOTO to demonstrate improved functional ability. Baseline: 42 Goal status: INITIAL    PLAN:  PT FREQUENCY: 2x/week  PT DURATION: 12 weeks  PLANNED INTERVENTIONS: Therapeutic exercises, Therapeutic activity, Neuromuscular re-education, Balance training, Gait training, Patient/Family education, Self Care, Joint mobilization, Stair training, Electrical stimulation, Cryotherapy, Vasopneumatic device, Ionotophoresis '4mg'$ /ml Dexamethasone, and Manual therapy  PLAN FOR NEXT SESSION: PROM, AAROM, strengthening for L knee   Andris Baumann, PT 10/29/2022, 3:49 PM

## 2022-10-28 NOTE — Telephone Encounter (Signed)
Post total knee replacement

## 2022-10-29 ENCOUNTER — Ambulatory Visit: Payer: Medicare Other

## 2022-10-29 DIAGNOSIS — M25562 Pain in left knee: Secondary | ICD-10-CM | POA: Diagnosis not present

## 2022-10-29 DIAGNOSIS — M6281 Muscle weakness (generalized): Secondary | ICD-10-CM

## 2022-10-29 DIAGNOSIS — R2689 Other abnormalities of gait and mobility: Secondary | ICD-10-CM

## 2022-10-29 DIAGNOSIS — M25662 Stiffness of left knee, not elsewhere classified: Secondary | ICD-10-CM

## 2022-11-05 ENCOUNTER — Encounter: Payer: Self-pay | Admitting: Orthopaedic Surgery

## 2022-11-05 ENCOUNTER — Ambulatory Visit (INDEPENDENT_AMBULATORY_CARE_PROVIDER_SITE_OTHER): Payer: Medicare Other | Admitting: Orthopaedic Surgery

## 2022-11-05 DIAGNOSIS — Z96652 Presence of left artificial knee joint: Secondary | ICD-10-CM

## 2022-11-05 MED ORDER — OXYCODONE HCL 5 MG PO TABS: 5.0000 mg | ORAL_TABLET | Freq: Two times a day (BID) | ORAL | 0 refills | Status: DC | PRN

## 2022-11-05 NOTE — Progress Notes (Signed)
The patient is now 6 weeks status post a left total knee arthroplasty.  We replaced his right knee a few years ago.  He is 77 years old and active.  He does need 1 more refill of oxycodone he states.  He is also been alternating that with Aleve.  On exam he lacks full extension by just a few degrees and he can flex well past 100 degrees.  The knee feels limply stable.  There is swelling to be expected.  His incision is healed.  His calf is soft and there is some foot and ankle swelling.  He will continue to increase his activities as comfort allows.  I did refill his oxycodone.  I would like to see him back in 4 weeks for repeat exam but no x-rays are needed.

## 2022-11-06 ENCOUNTER — Telehealth: Payer: Self-pay

## 2022-11-06 NOTE — Telephone Encounter (Signed)
Pt wanted Lauren to know he cancelled his [place] referral. He saw his surgeon yesterday. Per patient surgeon said everything looked good and pt reports no unusual pain. Pt said if Lauren thinks "he did something foolish and stupid,by cancelling" to let him know

## 2022-11-06 NOTE — Telephone Encounter (Signed)
Advised patient of annotation below and he declined appointment and [place]. He stated that he didn't talk to his surgeon about the [place]. He stated he will call back to scheduled an appointment with PCP if he changes his mind regarding the [place].

## 2022-11-11 ENCOUNTER — Telehealth: Payer: Self-pay | Admitting: Nurse Practitioner

## 2022-11-11 NOTE — Telephone Encounter (Signed)
Forms placed on [employer].

## 2022-11-11 NOTE — Telephone Encounter (Signed)
Type of forms received:disability placard  Routed PP:IRJJ mcelwee  Paperwork received by : terrill   Individual made aware of 3-5 business day turn around (Y/N):yes  Form completed and patient made aware of charges(Y/N):tes   Faxed to :   Form location:  lauren box

## 2022-12-16 ENCOUNTER — Ambulatory Visit: Payer: Medicare Other | Admitting: Orthopaedic Surgery

## 2023-01-19 ENCOUNTER — Other Ambulatory Visit: Payer: Self-pay | Admitting: Nurse Practitioner

## 2023-01-19 DIAGNOSIS — E1151 Type 2 diabetes mellitus with diabetic peripheral angiopathy without gangrene: Secondary | ICD-10-CM

## 2023-02-02 ENCOUNTER — Telehealth: Payer: Self-pay

## 2023-02-02 DIAGNOSIS — K080 Exfoliation of teeth due to systemic causes: Secondary | ICD-10-CM | POA: Diagnosis not present

## 2023-02-02 NOTE — Telephone Encounter (Signed)
Pts dental office called and asked if pt needs dental prophylaxis s/o bilateral knee replacements. I advised that Dr. Ninfa Linden only requires it for the 1st 3 months for routine dental cleaning. They stated understanding

## 2023-02-03 DIAGNOSIS — K080 Exfoliation of teeth due to systemic causes: Secondary | ICD-10-CM | POA: Diagnosis not present

## 2023-02-11 ENCOUNTER — Telehealth: Payer: Self-pay

## 2023-02-11 NOTE — Progress Notes (Unsigned)
Care Management & Coordination Services Pharmacy Note  02/11/2023 Name:  Richard Harding MRN:  [id number] DOB:  [birth year]  Summary: ***  Recommendations/Changes made from today's visit: ***  Follow up plan: ***   Subjective: Richard Harding is an 77 y.o. year old male who is a primary patient of McElwee, Lauren A, NP.  The care coordination team was consulted for assistance with disease management and care coordination needs.    {CCMTELEPHONEFACETOFACE:21091510} for {CCMINITIALFOLLOWUPCHOICE:21091511}.  Recent office visits: ***  Recent consult visits: ***  Hospital visits: {Hospital DC Yes/No:25215}   Objective:  Lab Results  Component Value Date   CREATININE 0.91 09/26/2022   BUN 19 09/26/2022   GFR 70.47 06/25/2022   GFRNONAA >60 09/26/2022   GFRAA >60 04/28/2020   NA 137 09/26/2022   K 3.2 (L) 09/26/2022   CALCIUM 8.6 (L) 09/26/2022   CO2 27 09/26/2022   GLUCOSE 127 (H) 09/26/2022    Lab Results  Component Value Date/Time   HGBA1C 6.1 (H) 09/15/2022 11:51 AM   HGBA1C 6.4 06/25/2022 10:12 AM   HGBA1C 7.1 03/26/2022 05:18 PM   HGBA1C 7.1 (A) 03/26/2022 05:18 PM   HGBA1C 7.1 (A) 03/26/2022 05:18 PM   HGBA1C 6.2 01/12/2019 11:55 AM   GFR 70.47 06/25/2022 10:12 AM   GFR 69.96 09/04/2020 03:15 PM   MICROALBUR 1.3 02/29/2020 01:56 PM   MICROALBUR 1.0 01/12/2019 12:08 PM    Last diabetic Eye exam:  Lab Results  Component Value Date/Time   HMDIABEYEEXA No Retinopathy 07/23/2022 12:00 AM    Last diabetic Foot exam: No results found for: "HMDIABFOOTEX"   Lab Results  Component Value Date   CHOL 135 06/25/2022   HDL 35.10 (L) 06/25/2022   LDLCALC 76 03/26/2022   LDLDIRECT 76.0 06/25/2022   TRIG 282.0 (H) 06/25/2022   CHOLHDL 4 06/25/2022       Latest Ref Rng & Units 06/25/2022   10:12 AM 03/26/2022    2:30 PM 09/04/2020    3:15 PM  Hepatic Function  Total Protein 6.0 - 8.3 g/dL 7.5  7.2  7.6   Albumin 3.5 - 5.2 g/dL 4.7   4.5   AST 0 -  37 U/L 14  14  18   $ ALT 0 - 53 U/L 17  17  16   $ Alk Phosphatase 39 - 117 U/L 42   38   Total Bilirubin 0.2 - 1.2 mg/dL 0.5  0.5  0.5     Lab Results  Component Value Date/Time   TSH 5.39 (H) 03/26/2022 03:00 PM       Latest Ref Rng & Units 09/26/2022    3:30 AM 09/15/2022   11:51 AM 03/26/2022    2:30 PM  CBC  WBC 4.0 - 10.5 K/uL 12.8  6.5  7.6   Hemoglobin 13.0 - 17.0 g/dL 12.8  15.5  14.0   Hematocrit 39.0 - 52.0 % 37.8  46.3  41.1   Platelets 150 - 400 K/uL 261  303  269     No results found for: "VD25OH", "VITAMINB12"  Clinical ASCVD: {YES/NO:21197} The 10-year ASCVD risk score (Arnett DK, et al., 2019) is: 60.5%   Values used to calculate the score:     Age: 30 years     Sex: Male     Is Non-Hispanic African American: No     Diabetic: Yes     Tobacco smoker: No     Systolic Blood Pressure: 0000000 mmHg     Is BP treated: Yes  HDL Cholesterol: 35.1 mg/dL     Total Cholesterol: 135 mg/dL    ***Other: (CHADS2VASc if Afib, MMRC or CAT for COPD, ACT, DEXA)     04/07/2022    5:02 PM 09/04/2020    3:12 PM 01/12/2019   11:48 AM  Depression screen PHQ 2/9  Decreased Interest 0 0 0  Down, Depressed, Hopeless 0 0 0  PHQ - 2 Score 0 0 0     Social History   Tobacco Use  Smoking Status Never  Smokeless Tobacco Never   BP Readings from Last 3 Encounters:  10/05/22 (!) 158/78  09/26/22 120/64  09/15/22 138/65   Pulse Readings from Last 3 Encounters:  10/05/22 96  09/26/22 73  09/15/22 82   Wt Readings from Last 3 Encounters:  10/05/22 188 lb 3.2 oz (85.4 kg)  09/25/22 180 lb (81.6 kg)  09/15/22 180 lb (81.6 kg)   BMI Readings from Last 3 Encounters:  10/05/22 28.62 kg/m  09/25/22 27.37 kg/m  09/15/22 27.37 kg/m    Allergies  Allergen Reactions   Cinnamon Flavor Other (See Comments)    Cinnamon chewing gum caused blisters on tongue    Medications Reviewed Today     Reviewed by Mcarthur Rossetti, MD (Physician) on 11/05/22 at [facility]  Status: <None>   Medication Order Taking? Sig Documenting Provider Last Dose Status Informant  Ascorbic Acid (VITAMIN C) 1000 MG tablet [id number] No Take 1,000 mg by mouth at bedtime. [provider] Past Month Active Self  aspirin 81 MG chewable tablet [id number]  Chew 1 tablet (81 mg total) by mouth 2 (two) times daily. Mcarthur Rossetti, MD  Active   atorvastatin (LIPITOR) 10 MG tablet [id number] No Take 1 tablet (10 mg total) by mouth daily. McElwee, Lauren A, NP Past Week Active Self  b complex vitamins capsule [id number] No Take 1 capsule by mouth daily. [provider] Past Week Active Self  blood glucose meter kit and supplies KIT [id number] No Check blood glucose once daily. E11.9. Lucille Passy, MD Past Week Active Self  Calcium Carbonate-Vit D-Min (CALCIUM 1200 PO) [id number] No Take 1 tablet by mouth 3 (three) times a week. [provider] Past Week Active Self  chlorthalidone (HYGROTON) 25 MG tablet [id number] No Take 1 tablet (25 mg total) by mouth daily. Charyl Dancer, NP 09/24/2022 Active Self  Continuous Blood Gluc Sensor (DEXCOM G6 SENSOR) MISC [id number]  USE AS DIRECTED McElwee, Lauren A, NP  Active   Continuous Blood Gluc Transmit (DEXCOM G6 TRANSMITTER) MISC [id number] No 1 each by Does not apply route continuous. McElwee, Lauren A, NP Past Week Active Self  dapagliflozin propanediol (FARXIGA) 5 MG TABS tablet [id number] No Take 1 tablet (5 mg total) by mouth daily before breakfast. McElwee, Lauren A, NP 09/21/2022 Active Self  fenofibrate 54 MG tablet [id number] No Take 1 tablet (54 mg total) by mouth daily. McElwee, Lauren A, NP Past Week Active Self  GLUCOSA-CHONDR-NA CHONDR-MSM PO [id number] No Take 1 capsule by mouth at bedtime. [provider] Past Week Active Self  metFORMIN (GLUCOPHAGE-XR) 500 MG 24 hr tablet [id number]  TAKE 2 TABLETS BY MOUTH ONCE DAILY WITH BREAKFAST McElwee, Lauren A, NP  Active   methocarbamol (ROBAXIN) 500 MG tablet  [id number]  Take 1 tablet (500 mg total) by mouth every 6 (six) hours as needed for muscle spasms. Mcarthur Rossetti, MD  Active   metoprolol tartrate (LOPRESSOR) 50 MG tablet [id number] No Take 1 tablet (50 mg  total) by mouth 2 (two) times daily.  Patient taking differently: Take 100 mg by mouth at bedtime.   Charyl Dancer, NP 09/24/2022 2100 Active Self  Multiple Vitamin (MULTIVITAMIN WITH MINERALS) TABS tablet [id number] No Take 1 tablet by mouth daily. [provider] Past Week Active Self  naproxen sodium (ALEVE) 220 MG tablet [id number] No Take 220 mg by mouth daily as needed (pain). [provider] Past Month Active Self  omega-3 acid ethyl esters (LOVAZA) 1 g capsule [id number] No Take 1 capsule (1 g total) by mouth 4 (four) times daily.  Patient taking differently: Take 4 g by mouth at bedtime.   McElwee, Lauren A, NP Taking Active Self  oxyCODONE (OXY IR/ROXICODONE) 5 MG immediate release tablet [id number]  TAKE 1 TO 2 TABLETS BY MOUTH EVERY 6 HOURS AS NEEDED FOR  MODERATE  PAIN.  NO  MORE  THAN  6  TABS  DAILY Mcarthur Rossetti, MD  Active   polyvinyl alcohol (LIQUIFILM TEARS) 1.4 % ophthalmic solution [id number] No Place 1 drop into both eyes as needed for dry eyes. [provider] Past Week Active Self  Potassium Bicarbonate 99 MG CAPS [id number] No Take 99 mg by mouth daily. [provider] 09/24/2022 Active Self  potassium chloride SA (KLOR-CON M) 20 MEQ tablet [id number] No Take 1 tablet (20 mEq total) by mouth 2 (two) times daily. Mcarthur Rossetti, MD 09/24/2022 Active   Prenatal Vit-Fe Fumarate-FA (PRENATAL VITAMIN PO) [id number] No Take 1 tablet by mouth daily. [provider] Past Week Active Self  Red Yeast Rice 600 MG CAPS [id number] No Take 1 capsule (600 mg total) by mouth daily. Ann Held, DO Past Month Active Self  sulfamethoxazole-trimethoprim (BACTRIM DS) 800-160 MG tablet [id number]  Take 1 tablet by mouth 2  (two) times daily. McElwee, Lauren A, NP  Active   triamcinolone ointment (KENALOG) 0.1 % [id number] No Apply 1 Application topically 2 (two) times daily as needed for rash. [provider] Past Week Active Self  Med List Note Nat Christen, CPhT 09/10/22 1430): Pt prefers to receive text messages if he does not answer the phone and not a voicemail            SDOH:  (Social Determinants of Health) assessments and interventions performed: {yes/no:20286} SDOH Interventions    Flowsheet Row Chronic Care Management from 04/02/2022 in [place] at [facility] [facility] Most recent reading at 04/17/2022 12:27 PM Clinical Support from 04/07/2022 in [place] at [place] Most recent reading at 04/07/2022  5:03 PM  SDOH Interventions    Food Insecurity Interventions -- Intervention Not Indicated  Housing Interventions -- Intervention Not Indicated  Transportation Interventions Intervention Not Indicated Intervention Not Indicated  Financial Strain Interventions Intervention Not Indicated Intervention Not Indicated  Physical Activity Interventions -- Intervention Not Indicated  Stress Interventions -- Intervention Not Indicated  Social Connections Interventions -- Intervention Not Indicated, Patient Refused       Medication Assistance: {MEDASSISTANCEINFO:25044}  Medication Access: Within the past 30 days, how often has patient missed a dose of medication? *** Is a pillbox or other method used to improve adherence? {YES/NO:21197} Factors that may affect medication adherence? {CHL DESC; BARRIERS:21522} Are meds synced by current pharmacy? {YES/NO:21197} Are meds delivered by current pharmacy? {YES/NO:21197} Does patient experience delays in picking up medications due to transportation concerns? {YES/NO:21197}  [facility] Reviewed: Is patient disadvantaged to use [facility]?: {YES/NO:21197} Current Rx insurance plan:  *** Name  and location of Current pharmacy:  [facility] [place] [address] Phone: [phone] Fax: [phone]  [facility] services reviewed with patient today?: {YES/NO:21197} Patient requests to transfer care to [facility]?: {YES/NO:21197} Reason patient declined to change pharmacies: {US patient preference:27474}  Compliance/Adherence/Medication fill history: Care Gaps: ***  Star-Rating Drugs: ***   Assessment/Plan  Hypertension (BP goal <140/90) -Controlled -Current treatment: Chlorthalidone 25 mg daily  Metoprolol XL 50 mg twice daily  -Medications previously tried: NA  -Current home readings: Does not monitor at home.  -Current dietary habits: Limits added salt, but does enjoy canned soup.  -Denies hypotensive/hypertensive symptoms -Recommended to continue current medication  Hyperlipidemia: (LDL goal < 70) -Uncontrolled -Current treatment: Atorvastatin 10 mg daily  Fenofibrate 54 mg daily  Lovaza 4g daily  -Medications previously tried: NA  -Fearful of statins.  -Recommended to continue current medication  Diabetes (A1c goal <7%) -Controlled -Current medications: Farxiga 5 mg daily  Metformin XR 1000 mg nightly  -Medications previously tried: NA  -Current home glucose readings:  -Denies hypoglycemic/hyperglycemic symptoms -Current meal patterns: eating less rice, does still enjoy brown rice, lentils, corn.  -Breakfast: Low sugar cereal.  -Recommended to continue current medication  Osteoarthritis (Goal: Minimiez pain) -Not ideally controlled -Current treatment  Naproxen 220 mg 1-2 times daily  -Medications previously tried: NA -Discussed risks of chronic NSAID use.  -Recommend starting acetaminophen CR 650 mg twice daily as needed -Recommended taking naproxen only as needed for moderate-severe arthritis pain.   Health Maintenance -Not ideally  controlled -Medications previously tried: NA  -Current treatment  Aspirin 81 mg daily Glucosamine Chondroitin 150 daily  Calcium 600 mg three times weekly  Flax Seed Oil 400 mg 2 capsules daily -Educated on Herbal supplement research is limited and benefits usually cannot be proven; supplements may interfere with prescription drugs    ***

## 2023-02-11 NOTE — Telephone Encounter (Signed)
Care Management & Coordination Services Outreach Note  02/11/2023 Name: Richard Harding MRN: [id number] DOB: 1946-02-17  Referred by: Charyl Dancer, NP  Patient had a phone appointment scheduled with clinical pharmacist today.  An unsuccessful telephone outreach was attempted today. The patient was referred to the pharmacist for assistance with medications, care management and care coordination.   Patient will NOT be penalized in any way for missing a Care Management & Coordination Services appointment. The no-show fee does not apply.  Junius Argyle, PharmD, [place], CPP Clinical Pharmacist Practitioner  [facility] at [facility]  [phone]

## 2023-02-15 ENCOUNTER — Other Ambulatory Visit: Payer: Self-pay | Admitting: Nurse Practitioner

## 2023-02-15 DIAGNOSIS — E1151 Type 2 diabetes mellitus with diabetic peripheral angiopathy without gangrene: Secondary | ICD-10-CM

## 2023-02-16 NOTE — Telephone Encounter (Signed)
I called patient to schedule an appointment for Rx refill. Appointment scheduled for 02/22/23 at 2:20pm

## 2023-02-18 ENCOUNTER — Telehealth: Payer: Medicare Other

## 2023-02-22 ENCOUNTER — Encounter: Payer: Self-pay | Admitting: Nurse Practitioner

## 2023-02-22 ENCOUNTER — Ambulatory Visit (INDEPENDENT_AMBULATORY_CARE_PROVIDER_SITE_OTHER): Payer: Medicare Other | Admitting: Nurse Practitioner

## 2023-02-22 VITALS — BP 140/70 | HR 94 | Temp 98.9°F | Ht 68.0 in | Wt 183.0 lb

## 2023-02-22 DIAGNOSIS — R972 Elevated prostate specific antigen [PSA]: Secondary | ICD-10-CM

## 2023-02-22 DIAGNOSIS — E1151 Type 2 diabetes mellitus with diabetic peripheral angiopathy without gangrene: Secondary | ICD-10-CM | POA: Diagnosis not present

## 2023-02-22 DIAGNOSIS — B351 Tinea unguium: Secondary | ICD-10-CM

## 2023-02-22 DIAGNOSIS — E785 Hyperlipidemia, unspecified: Secondary | ICD-10-CM | POA: Diagnosis not present

## 2023-02-22 DIAGNOSIS — Z201 Contact with and (suspected) exposure to tuberculosis: Secondary | ICD-10-CM | POA: Diagnosis not present

## 2023-02-22 DIAGNOSIS — I10 Essential (primary) hypertension: Secondary | ICD-10-CM | POA: Diagnosis not present

## 2023-02-22 DIAGNOSIS — R351 Nocturia: Secondary | ICD-10-CM | POA: Diagnosis not present

## 2023-02-22 LAB — POCT GLUCOSE (DEVICE FOR HOME USE): POC Glucose: 142 mg/dl — AB (ref 70–99)

## 2023-02-22 MED ORDER — OMEGA-3-ACID ETHYL ESTERS 1 G PO CAPS: 1.0000 g | ORAL_CAPSULE | Freq: Four times a day (QID) | ORAL | 1 refills | Status: DC

## 2023-02-22 MED ORDER — DAPAGLIFLOZIN PROPANEDIOL 5 MG PO TABS: 5.0000 mg | ORAL_TABLET | Freq: Every day | ORAL | 3 refills | Status: DC

## 2023-02-22 MED ORDER — METOPROLOL TARTRATE 100 MG PO TABS: 100.0000 mg | ORAL_TABLET | Freq: Two times a day (BID) | ORAL | 3 refills | Status: DC

## 2023-02-22 MED ORDER — FENOFIBRATE 54 MG PO TABS: 54.0000 mg | ORAL_TABLET | Freq: Every day | ORAL | 3 refills | Status: DC

## 2023-02-22 MED ORDER — METOPROLOL TARTRATE 50 MG PO TABS: 50.0000 mg | ORAL_TABLET | Freq: Two times a day (BID) | ORAL | 1 refills | Status: DC

## 2023-02-22 MED ORDER — ATORVASTATIN CALCIUM 10 MG PO TABS: 10.0000 mg | ORAL_TABLET | Freq: Every day | ORAL | 3 refills | Status: DC

## 2023-02-22 MED ORDER — CHLORTHALIDONE 25 MG PO TABS: 25.0000 mg | ORAL_TABLET | Freq: Every day | ORAL | 3 refills | Status: DC

## 2023-02-22 MED ORDER — METFORMIN HCL ER 500 MG PO TB24: 500.0000 mg | ORAL_TABLET | Freq: Every day | ORAL | 1 refills | Status: DC

## 2023-02-22 MED ORDER — TERBINAFINE HCL 250 MG PO TABS: 250.0000 mg | ORAL_TABLET | Freq: Every day | ORAL | 0 refills | Status: DC

## 2023-02-22 NOTE — Progress Notes (Unsigned)
Established Patient Office Visit  Subjective   Patient ID: Richard Harding, male    DOB: 04/10/1946  Age: 77 y.o. MRN: [id number]  Chief Complaint  Patient presents with   Diabetes    With Rx refills, no concerns    HPI  [facility] Richard Harding is here to follow-up on diabetes. He also states that he was exposed to TB and has toenail fungus on his right great toe.   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  He states that he had mulch with his friend about 3 weeks ago.  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  He has never had a gold QuantiFERON drawn.  He denies fever, cough, night sweats.  He also noticed that he had discoloration on his right great toenail that started a few weeks ago.  He has been doing foot soaks and other over-the-counter treatments which have not helped.  He is wondering if there is a medication that can help with this.  DIABETES  Hypoglycemic episodes:no Polydipsia/polyuria: no Visual disturbance: no Chest pain: no Paresthesias: no Glucose Monitoring: no Taking Insulin?: no Blood Pressure Monitoring: daily Retinal Examination: Up to Date Foot Exam: Up to Date Diabetic Education: Completed Pneumovax: Up to Date Influenza: Not up to Date    ROS See pertinent positives and negatives per HPI.    Objective:     BP (!) 140/70 (BP Location: Right Arm, Cuff Size: Normal)   Pulse 94   Temp 98.9 F (37.2 C) (Oral)   Ht '5\' 8"'$  (1.727 m)   Wt 183 lb (83 kg)   SpO2 97%   BMI 27.83 kg/m    Physical Exam Vitals and nursing note reviewed.  Constitutional:      Appearance: Normal appearance.  HENT:     Head: Normocephalic.  Eyes:     Conjunctiva/sclera: Conjunctivae normal.  Cardiovascular:     Rate and Rhythm: Normal rate and regular rhythm.     Pulses: Normal pulses.     Heart sounds: Normal heart sounds.  Pulmonary:     Effort: Pulmonary effort is normal.      Breath sounds: Normal breath sounds.  Musculoskeletal:     Cervical back: Normal range of motion.  Skin:    General: Skin is warm.     Comments: Discoloration and thickness to right great toenail  Neurological:     General: No focal deficit present.     Mental Status: He is alert and oriented to person, place, and time.  Psychiatric:        Mood and Affect: Mood normal.        Behavior: Behavior normal.        Thought Content: Thought content normal.        Judgment: Judgment normal.      Results for orders placed or performed in visit on 02/22/23  Hemoglobin A1c  Result Value Ref Range   Hgb A1c MFr Bld 6.5 4.6 - 6.5 %  CBC  Result Value Ref Range   WBC 7.4 4.0 - 10.5 K/uL   RBC 5.46 4.22 - 5.81 Mil/uL   Platelets 314.0 150.0 - 400.0 K/uL   Hemoglobin 15.6 13.0 - 17.0 g/dL   HCT 45.6 39.0 - 52.0 %   MCV 83.7 78.0 - 100.0 fl   MCHC 34.1 30.0 - 36.0 g/dL   RDW 13.6 11.5 - 15.5 %  Comprehensive metabolic panel  Result Value Ref Range   Sodium 140 135 - 145 mEq/L   Potassium 3.2 (L) 3.5 - 5.1 mEq/L   Chloride 100 96 - 112 mEq/L   CO2 26 19 - 32 mEq/L   Glucose, Bld 141 (H) 70 - 99 mg/dL   BUN 22 6 - 23 mg/dL   Creatinine, Ser 1.01 0.40 - 1.50 mg/dL   Total Bilirubin 0.4 0.2 - 1.2 mg/dL   Alkaline Phosphatase 46 39 - 117 U/L   AST 14 0 - 37 U/L   ALT 15 0 - 53 U/L   Total Protein 7.4 6.0 - 8.3 g/dL   Albumin 4.4 3.5 - 5.2 g/dL   GFR 72.65 >60.00 mL/min   Calcium 10.4 8.4 - 10.5 mg/dL  Lipid panel  Result Value Ref Range   Cholesterol 137 0 - 200 mg/dL   Triglycerides 306.0 (H) 0.0 - 149.0 mg/dL   HDL 32.00 (L) >39.00 mg/dL   VLDL 61.2 (H) 0.0 - 40.0 mg/dL   Total CHOL/HDL Ratio 4    NonHDL 104.64   PSA  Result Value Ref Range   PSA 28.45 (H) 0.10 - 4.00 ng/mL  Microalbumin / creatinine urine ratio  Result Value Ref Range   Microalb, Ur 0.9 0.0 - 1.9 mg/dL   Creatinine,U 78.1 mg/dL   Microalb Creat Ratio 1.1 0.0 - 30.0 mg/g  LDL cholesterol, direct  Result  Value Ref Range   Direct LDL 81.0 mg/dL  POCT Glucose (Device for Home Use)  Result Value Ref Range   Glucose Fasting, POC     POC Glucose 142 (A) 70 - 99 mg/dl      The 10-year ASCVD risk score (Arnett DK, et al., 2019) is: 54%    Assessment & Plan:   Problem List Items Addressed This Visit       Cardiovascular and Mediastinum   Essential hypertension    Chronic, not controlled.  Continue chlorthalidone 25 mg daily and will increase metoprolol to 100 mg twice a day.  His blood pressure has been in the 140s at home as well.  Check CMP, CBC today.  Follow-up in 3 months.      Relevant Medications   OMEGA-3-ACID ETHYL ESTERS PO   atorvastatin (LIPITOR) 10 MG tablet   chlorthalidone (HYGROTON) 25 MG tablet   fenofibrate 54 MG tablet   omega-3 acid ethyl esters (LOVAZA) 1 g capsule   metoprolol tartrate (LOPRESSOR) 100 MG tablet   Other Relevant Orders   Hemoglobin A1c (Completed)   CBC (Completed)   DM (diabetes mellitus) type II, controlled, with peripheral vascular disorder (HCC)    Chronic, stable.  Continue metformin XR 500 mg daily and Farxiga 5 mg daily.  Discussed nutrition.  He is up-to-date on his eye exam and pneumonia vaccine.  Follow-up in 3 months.  He would like his glucose checked today and it was 142, which is normal because he was not fasting.  Will check A1c, urine microalbumin today.      Relevant Medications   OMEGA-3-ACID ETHYL ESTERS PO   atorvastatin (LIPITOR) 10 MG tablet   chlorthalidone (HYGROTON) 25 MG tablet   dapagliflozin propanediol (FARXIGA) 5 MG TABS tablet   fenofibrate 54 MG tablet   metFORMIN (GLUCOPHAGE-XR) 500 MG 24 hr tablet   omega-3 acid ethyl esters (LOVAZA) 1 g capsule   metoprolol tartrate (LOPRESSOR) 100 MG tablet   Other Relevant Orders   Hemoglobin A1c (Completed)   CBC (Completed)   Microalbumin / creatinine  urine ratio (Completed)   POCT Glucose (Device for Home Use) (Completed)     Other   Hyperlipidemia LDL goal <70     Chronic, stable.  Continue atorvastatin 10 mg daily.  Check CMP, CBC, lipid panel today.      Relevant Medications   OMEGA-3-ACID ETHYL ESTERS PO   atorvastatin (LIPITOR) 10 MG tablet   chlorthalidone (HYGROTON) 25 MG tablet   fenofibrate 54 MG tablet   omega-3 acid ethyl esters (LOVAZA) 1 g capsule   metoprolol tartrate (LOPRESSOR) 100 MG tablet   Other Relevant Orders   Comprehensive metabolic panel (Completed)   Lipid panel (Completed)   Elevated PSA    Addendum 02/23/2023: PSA came back elevated at 28.  Upon review of his chart, 6 years ago his PSA was elevated at 9 and was referred to urology, however he declined referral.  Reached out to them today and encouraged him to follow-up with urology as this could be infection versus possible cancer.      Relevant Orders   Ambulatory referral to Urology   Other Visit Diagnoses     Exposure to TB    -  Primary   He was exposed to TB from a friend about 3 weeks ago. No symptoms currently. Will check TB gold. Educated on symptoms to watch for.   Relevant Orders   QuantiFERON-TB Gold Plus   Onychomycosis       Recent liver function reviewed. Will start terbinafine '250mg'$  daily x12 weeks. Can continue OTC treatments. F/U 3 months   Relevant Medications   terbinafine (LAMISIL) 250 MG tablet   Nocturia       Check PSA today   Relevant Orders   PSA (Completed)       Return in about 3 months (around 05/25/2023) for Diabetes.    Charyl Dancer, NP

## 2023-02-22 NOTE — Patient Instructions (Addendum)
It was great to see you!  We are checking your labs today and will let you know the results via mychart/phone.   Start terbinafine 1 tablet daily for your toenail. You will take this for 90 days.   Please contact me sooner if you develop a bloody cough or fevers.   Increase your metoprolol to 1/2 ('50mg'$ ) tablet in the morning and 1 ('100mg'$ ) tablet at night.   Let's follow-up in 3 months, sooner if you have concerns.  If a referral was placed today, you will be contacted for an appointment. Please note that routine referrals can sometimes take up to 3-4 weeks to process. Please call our office if you haven't heard anything after this time frame.  Take care,  Vance Peper, NP

## 2023-02-23 DIAGNOSIS — R972 Elevated prostate specific antigen [PSA]: Secondary | ICD-10-CM

## 2023-02-23 HISTORY — DX: Elevated prostate specific antigen (PSA): R97.20

## 2023-02-23 LAB — COMPREHENSIVE METABOLIC PANEL
ALT: 15 U/L (ref 0–53)
AST: 14 U/L (ref 0–37)
Albumin: 4.4 g/dL (ref 3.5–5.2)
Alkaline Phosphatase: 46 U/L (ref 39–117)
BUN: 22 mg/dL (ref 6–23)
CO2: 26 mEq/L (ref 19–32)
Calcium: 10.4 mg/dL (ref 8.4–10.5)
Chloride: 100 mEq/L (ref 96–112)
Creatinine, Ser: 1.01 mg/dL (ref 0.40–1.50)
GFR: 72.65 mL/min (ref >60.00)
Glucose, Bld: 141 mg/dL — ABNORMAL HIGH (ref 70–99)
Potassium: 3.2 mEq/L — ABNORMAL LOW (ref 3.5–5.1)
Sodium: 140 mEq/L (ref 135–145)
Total Bilirubin: 0.4 mg/dL (ref 0.2–1.2)
Total Protein: 7.4 g/dL (ref 6.0–8.3)

## 2023-02-23 LAB — MICROALBUMIN / CREATININE URINE RATIO
Creatinine,U: 78.1 mg/dL
Microalb Creat Ratio: 1.1 mg/g (ref 0.0–30.0)
Microalb, Ur: 0.9 mg/dL (ref 0.0–1.9)

## 2023-02-23 LAB — CBC
HCT: 45.6 % (ref 39.0–52.0)
Hemoglobin: 15.6 g/dL (ref 13.0–17.0)
MCHC: 34.1 g/dL (ref 30.0–36.0)
MCV: 83.7 fl (ref 78.0–100.0)
Platelets: 314 10*3/uL (ref 150.0–400.0)
RBC: 5.46 Mil/uL (ref 4.22–5.81)
RDW: 13.6 % (ref 11.5–15.5)
WBC: 7.4 10*3/uL (ref 4.0–10.5)

## 2023-02-23 LAB — LIPID PANEL
Cholesterol: 137 mg/dL (ref 0–200)
HDL: 32 mg/dL — ABNORMAL LOW
NonHDL: 104.64
Total CHOL/HDL Ratio: 4
Triglycerides: 306 mg/dL — ABNORMAL HIGH (ref 0.0–149.0)
VLDL: 61.2 mg/dL — ABNORMAL HIGH (ref 0.0–40.0)

## 2023-02-23 LAB — LDL CHOLESTEROL, DIRECT: Direct LDL: 81 mg/dL

## 2023-02-23 LAB — HEMOGLOBIN A1C: Hgb A1c MFr Bld: 6.5 % (ref 4.6–6.5)

## 2023-02-23 LAB — PSA: PSA: 28.45 ng/mL — ABNORMAL HIGH (ref 0.10–4.00)

## 2023-02-23 MED ORDER — POTASSIUM CHLORIDE ER 10 MEQ PO TBCR: 10.0000 meq | EXTENDED_RELEASE_TABLET | Freq: Every day | ORAL | 0 refills | Status: DC

## 2023-02-23 NOTE — Assessment & Plan Note (Signed)
Chronic, not controlled.  Continue chlorthalidone 25 mg daily and will increase metoprolol to 100 mg twice a day.  His blood pressure has been in the 140s at home as well.  Check CMP, CBC today.  Follow-up in 3 months.

## 2023-02-23 NOTE — Assessment & Plan Note (Signed)
Chronic, stable.  Continue atorvastatin 10 mg daily.  Check CMP, CBC, lipid panel today.

## 2023-02-23 NOTE — Assessment & Plan Note (Addendum)
Chronic, stable.  Continue metformin XR 500 mg daily and Farxiga 5 mg daily.  Discussed nutrition.  He is up-to-date on his eye exam and pneumonia vaccine.  Follow-up in 3 months.  He would like his glucose checked today and it was 142, which is normal because he was not fasting.  Will check A1c, urine microalbumin today.

## 2023-02-23 NOTE — Assessment & Plan Note (Signed)
Addendum 02/23/2023: PSA came back elevated at 28.  Upon review of his chart, 6 years ago his PSA was elevated at 9 and was referred to urology, however he declined referral.  Reached out to them today and encouraged him to follow-up with urology as this could be infection versus possible cancer.

## 2023-02-24 ENCOUNTER — Telehealth: Payer: Self-pay

## 2023-02-24 LAB — QUANTIFERON-TB GOLD PLUS
Mitogen-NIL: 8.88 IU/mL
NIL: 0.06 IU/mL
QuantiFERON-TB Gold Plus: NEGATIVE
TB1-NIL: 0.14 IU/mL
TB2-NIL: 0.09 IU/mL

## 2023-02-24 MED ORDER — EMPAGLIFLOZIN 10 MG PO TABS: 10.0000 mg | ORAL_TABLET | Freq: Every day | ORAL | 0 refills | Status: DC

## 2023-02-24 NOTE — Telephone Encounter (Signed)
I called patient to give him additional lab results and he stated that he request for Rx to be changed.  His Rx of Wilder Glade has increased to $140.00 now and wants to either have it changed to a cheaper medication or increase his other Rx of Metoprolol

## 2023-02-24 NOTE — Telephone Encounter (Signed)
I called patient and notified him of medication change. He will pick up from pharmacy  [name] said that he got his updated Covid Vaccine yesterday at pharmacy. I will update his vaccine record with this information

## 2023-02-25 ENCOUNTER — Telehealth: Payer: Self-pay | Admitting: Nurse Practitioner

## 2023-02-25 MED ORDER — SITAGLIPTIN PHOSPHATE 100 MG PO TABS: 100.0000 mg | ORAL_TABLET | Freq: Every day | ORAL | 2 refills | Status: DC

## 2023-02-25 NOTE — Telephone Encounter (Signed)
Caller Name: [name] Call back phone #: [phone]  Reason for Call: Pt went to pick up his new med it also cost 135.00 like his old one. He is hoping that you have  a 3rd option that will be cheaper.

## 2023-02-25 NOTE — Telephone Encounter (Signed)
Patient notified that a new / Rx sent in. He was thankful and said if this new Rx cost a lot that he would have to call back to  get it changed gain.

## 2023-03-02 ENCOUNTER — Telehealth: Payer: Self-pay | Admitting: Nurse Practitioner

## 2023-03-02 NOTE — Telephone Encounter (Signed)
Caller Name: Hassan Rowan with [facility] Call back phone #: [phone] option 5  Reason for Call: denial for [place] Fax will be sent in.

## 2023-03-03 ENCOUNTER — Encounter: Payer: Self-pay | Admitting: Urology

## 2023-03-03 ENCOUNTER — Ambulatory Visit (INDEPENDENT_AMBULATORY_CARE_PROVIDER_SITE_OTHER): Payer: Medicare Other | Admitting: Urology

## 2023-03-03 VITALS — BP 133/74 | HR 73 | Ht 68.0 in | Wt 185.0 lb

## 2023-03-03 DIAGNOSIS — R7989 Other specified abnormal findings of blood chemistry: Secondary | ICD-10-CM

## 2023-03-03 DIAGNOSIS — R972 Elevated prostate specific antigen [PSA]: Secondary | ICD-10-CM

## 2023-03-03 DIAGNOSIS — N401 Enlarged prostate with lower urinary tract symptoms: Secondary | ICD-10-CM | POA: Diagnosis not present

## 2023-03-03 DIAGNOSIS — E291 Testicular hypofunction: Secondary | ICD-10-CM

## 2023-03-03 DIAGNOSIS — N138 Other obstructive and reflux uropathy: Secondary | ICD-10-CM | POA: Insufficient documentation

## 2023-03-03 DIAGNOSIS — R8290 Unspecified abnormal findings in urine: Secondary | ICD-10-CM

## 2023-03-03 DIAGNOSIS — N529 Male erectile dysfunction, unspecified: Secondary | ICD-10-CM | POA: Insufficient documentation

## 2023-03-03 HISTORY — DX: Other specified abnormal findings of blood chemistry: R79.89

## 2023-03-03 HISTORY — DX: Male erectile dysfunction, unspecified: N52.9

## 2023-03-03 LAB — URINALYSIS, ROUTINE W REFLEX MICROSCOPIC
Bilirubin, UA: NEGATIVE
Glucose, UA: 250 mg/dL — AB
Ketones, UA: NEGATIVE
Leukocytes, UA: NEGATIVE
Nitrite, UA: POSITIVE
Protein, UA: NEGATIVE
Spec Grav, UA: 1.01 (ref 1.010–1.025)
Urobilinogen, UA: 0.2 E.U./dL
pH, UA: 6 (ref 5.0–8.0)

## 2023-03-03 LAB — BLADDER SCAN AMB NON-IMAGING

## 2023-03-03 MED ORDER — TAMSULOSIN HCL 0.4 MG PO CAPS: 0.4000 mg | ORAL_CAPSULE | Freq: Every day | ORAL | 11 refills | Status: DC

## 2023-03-03 MED ORDER — SULFAMETHOXAZOLE-TRIMETHOPRIM 800-160 MG PO TABS: 1.0000 | ORAL_TABLET | Freq: Two times a day (BID) | ORAL | 0 refills | Status: DC

## 2023-03-03 NOTE — Progress Notes (Signed)
Assessment: 1. Elevated PSA   2. BPH with obstruction/lower urinary tract symptoms   3. Abnormal urine findings   4. Low testosterone   5. Organic impotence    Plan: I personally reviewed the patient's chart including provider notes, and lab results. I reviewed records from [facility]. Today I had a long discussion with the patient regarding PSA and the rationale and controversies of prostate cancer early detection.  I discussed the pros and cons of further evaluation including TRUS and prostate Bx.  Potential adverse events and complications as well as standard instructions were given.  Patient expressed his understanding of these issues. Urine culture sent today Begin Bactrim DS BID x 7 days for UTI Begin tamsulosin 0.4 mg daily for BPH Return to office in 6 weeks  Will need repeat PSA after UTI treated Discussed evaluation with prostate biopsy vs MRI for elevated PSA  Chief Complaint:  Chief Complaint  Patient presents with   Elevated PSA    History of Present Illness:  Richard Harding is a 77 y.o. male who is seen in consultation from [facility], NP for evaluation of elevated PSA. PSA results: 9/08 3.52 10/13 8.08 1/14 6.54 3/17 9.05 4/17 9.15 3/24 28.45  He has previously been evaluated for an elevated PSA by Dr. Reece Agar in 2008 and again by Dr. Junious Silk in November 2013. No prior prostate biopsy.  No family history of prostate cancer. He reports lower urinary tract symptoms including frequency, occasional urgency, and nocturia x 2.  He is not having any incontinence.  No dysuria or gross hematuria. IPSS = 16 today.  He also reports a history of erectile dysfunction and decreased libido.  He has previously used Viagra and Cialis with adequate results.  He is inquiring about testosterone replacement therapy. Testosterone level from 7/23 was 271.  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  Social History:  Social History   Tobacco Use   Smoking status: Never   Smokeless tobacco: Never  Vaping Use   Vaping Use: Never used  Substance Use Topics   Alcohol use: No   Drug use: No    Review of symptoms:  Constitutional:  Negative for unexplained weight loss, night sweats, fever, chills ENT:  Negative for nose bleeds, sinus pain, painful swallowing CV:  Negative for chest pain, shortness of breath, exercise intolerance, palpitations, loss of consciousness Resp:  Negative for cough, wheezing, shortness of breath GI:  Negative for nausea, vomiting, diarrhea, bloody stools GU:  Positives noted in HPI; otherwise negative for gross hematuria, dysuria, urinary incontinence Neuro:  Negative for seizures, poor balance, limb weakness, slurred speech Psych:  Negative for lack of energy, depression, anxiety Endocrine:  Negative for polydipsia, polyuria, symptoms of hypoglycemia (dizziness, hunger, sweating) Hematologic:  Negative for anemia, purpura, petechia, prolonged or excessive bleeding, use  of anticoagulants  Allergic:  Negative for difficulty breathing or choking as a result of exposure to anything; no shellfish allergy; no allergic response (rash/itch) to materials, foods  Physical exam: BP 133/74   Pulse 73   Ht '5\' 8"'$  (1.727 m)   Wt 185 lb (83.9 kg)   BMI 28.13 kg/m  GENERAL APPEARANCE:  Well appearing, well developed, well nourished, NAD HEENT: Atraumatic, Normocephalic, oropharynx clear. NECK: Supple without lymphadenopathy or thyromegaly. LUNGS: Clear to auscultation bilaterally. HEART: Regular Rate and Rhythm without murmurs, gallops, or rubs. ABDOMEN: Soft, non-tender, No Masses. EXTREMITIES: Moves all extremities well.  Without clubbing, cyanosis, or edema. NEUROLOGIC:  Alert and oriented x 3, normal gait, CN II-XII grossly intact.  MENTAL STATUS:  Appropriate. BACK:  Non-tender to palpation.  No CVAT SKIN:  Warm, dry and intact.   GU: Penis:  circumcised Meatus: Normal Scrotum: normal, no masses Testis: normal without masses bilateral Prostate: 50 g, NT, asymmetry R>L, firmness in left apex Rectum: Normal tone,  no masses or tenderness  Results: U/A:  2+ glucose, trace blood, nitrite +, 11-30 WBC, 0-2 RBC, many bacteria  PVR:  181 ml

## 2023-03-04 DIAGNOSIS — K080 Exfoliation of teeth due to systemic causes: Secondary | ICD-10-CM | POA: Diagnosis not present

## 2023-03-06 LAB — URINE CULTURE

## 2023-03-29 ENCOUNTER — Other Ambulatory Visit (HOSPITAL_COMMUNITY): Payer: Self-pay

## 2023-04-05 ENCOUNTER — Telehealth: Payer: Self-pay

## 2023-04-05 NOTE — Telephone Encounter (Signed)
Patient Advocate Encounter  Received a fax from [facility] regarding Prior Authorization for Omega-3-Acid Ethyl Esters 1 gm capsules.   Authorization has been DENIED due to   Determination letter attached to patient chart

## 2023-04-05 NOTE — Telephone Encounter (Signed)
I tried to call patient and voice message is a business message. I will try to call patient back later.

## 2023-04-06 NOTE — Telephone Encounter (Addendum)
Patient notified of message and he is taking Omega 3 over the counter. He said that the insurance did not totally deny it that they are paying for it. He said that he takes two capsules that are over the counter and two Rx capsules. He will call when he gets low for a refill.

## 2023-04-07 NOTE — Telephone Encounter (Signed)
Appeal form was faxed

## 2023-04-09 ENCOUNTER — Encounter: Payer: Self-pay | Admitting: Nurse Practitioner

## 2023-04-09 ENCOUNTER — Ambulatory Visit (INDEPENDENT_AMBULATORY_CARE_PROVIDER_SITE_OTHER): Payer: Medicare Other | Admitting: Nurse Practitioner

## 2023-04-09 VITALS — BP 118/62 | HR 82 | Temp 96.9°F | Ht 68.0 in | Wt 186.0 lb

## 2023-04-09 DIAGNOSIS — Z7984 Long term (current) use of oral hypoglycemic drugs: Secondary | ICD-10-CM | POA: Diagnosis not present

## 2023-04-09 DIAGNOSIS — E1151 Type 2 diabetes mellitus with diabetic peripheral angiopathy without gangrene: Secondary | ICD-10-CM

## 2023-04-09 DIAGNOSIS — J029 Acute pharyngitis, unspecified: Secondary | ICD-10-CM | POA: Diagnosis not present

## 2023-04-09 LAB — POC COVID19 BINAXNOW: SARS Coronavirus 2 Ag: NEGATIVE

## 2023-04-09 MED ORDER — LORATADINE 10 MG PO TABS: 10.0000 mg | ORAL_TABLET | Freq: Every day | ORAL | 3 refills | Status: DC

## 2023-04-09 MED ORDER — METFORMIN HCL ER 500 MG PO TB24: 500.0000 mg | ORAL_TABLET | Freq: Two times a day (BID) | ORAL | 1 refills | Status: DC

## 2023-04-09 NOTE — Assessment & Plan Note (Signed)
Metformin refill sent to the pharmacy. He has stopped farxiga and did not start jardiance due to cost.

## 2023-04-09 NOTE — Progress Notes (Signed)
Acute Office Visit  Subjective:     Patient ID: Richard Harding, male    DOB: [birth year], 77 y.o.   MRN: [id number]  Chief Complaint  Patient presents with   Sore Throat    With dry cough, some shortness of breath    HPI Patient is in today for cough and sore glands near his throat.   UPPER RESPIRATORY TRACT INFECTION  Fever: no Cough: yes Shortness of breath: yes Wheezing: no Chest pain: no Chest tightness: yes Chest congestion: no Nasal congestion: yes Runny nose: yes Post nasal drip: yes Sneezing: yes Sore throat: yes Swollen glands: yes Sinus pressure: no Headache: no Face pain: no Toothache: no Ear pain: no bilateral Ear pressure: no bilateral Eyes red/itching:no Eye drainage/crusting: no  Vomiting: no Rash: no Fatigue: yes Sick contacts: no Strep contacts: no  Context: stable Recurrent sinusitis: no Relief with OTC cold/cough medications:  n/a   Treatments attempted: nothing   ROS See pertinent positives and negatives per HPI.     Objective:    BP 118/62 (BP Location: Left Arm)   Pulse 82   Temp (!) 96.9 F (36.1 C)   Ht  (1.727 m)   Wt 186 lb (84.4 kg)   SpO2 98%   BMI 28.28 kg/m    Physical Exam Vitals and nursing note reviewed.  Constitutional:      Appearance: Normal appearance.  HENT:     Head: Normocephalic.     Right Ear: Tympanic membrane, ear canal and external ear normal.     Left Ear: Tympanic membrane, ear canal and external ear normal.     Mouth/Throat:     Mouth: Mucous membranes are moist.     Pharynx: No oropharyngeal exudate or posterior oropharyngeal erythema.  Eyes:     Conjunctiva/sclera: Conjunctivae normal.  Cardiovascular:     Rate and Rhythm: Normal rate and regular rhythm.     Pulses: Normal pulses.     Heart sounds: Normal heart sounds.  Pulmonary:     Effort: Pulmonary effort is normal.     Breath sounds: Normal breath sounds.  Musculoskeletal:     Cervical back: Normal range of motion  and neck supple. No tenderness.  Lymphadenopathy:     Cervical: No cervical adenopathy.  Skin:    General: Skin is warm.  Neurological:     General: No focal deficit present.     Mental Status: He is alert and oriented to person, place, and time.  Psychiatric:        Mood and Affect: Mood normal.        Behavior: Behavior normal.        Thought Content: Thought content normal.        Judgment: Judgment normal.     Results for orders placed or performed in visit on 04/09/23  POC COVID-19 BinaxNow  Result Value Ref Range   SARS Coronavirus 2 Ag Negative Negative        Assessment & Plan:   Problem List Items Addressed This Visit       Cardiovascular and Mediastinum   DM (diabetes mellitus) type II, controlled, with peripheral vascular disorder    Metformin refill sent to the pharmacy. He has stopped farxiga and did not start jardiance due to cost.       Relevant Medications   metFORMIN (GLUCOPHAGE-XR) 500 MG 24 hr tablet   Other Visit Diagnoses     Sore throat    -  Primary   POC  covid negative. Most likely allergies. Start loratadine  daily. Drink plenty of fluids. Follow-up if not improving.   Relevant Orders   POC COVID-19 BinaxNow (Completed)       Meds ordered this encounter  Medications   loratadine (CLARITIN) 10 MG tablet    Sig: Take 1 tablet (10 mg total) by mouth daily.    Dispense:  90 tablet    Refill:  3   metFORMIN (GLUCOPHAGE-XR) 500 MG 24 hr tablet    Sig: Take 1 tablet (500 mg total) by mouth 2 (two) times daily with a meal.    Dispense:  180 tablet    Refill:  1    Return if symptoms worsen or fail to improve.  Gerre Scull, NP

## 2023-04-09 NOTE — Patient Instructions (Signed)
It was great to see you!  Start loratadine (claritin) 1 tablet daily.   Let's follow-up if your symptoms worsen or don't improve.   Take care,  Rodman Pickle, NP

## 2023-04-13 ENCOUNTER — Telehealth: Payer: Self-pay | Admitting: Nurse Practitioner

## 2023-04-13 NOTE — Telephone Encounter (Signed)
Pt isn't feeling any better since he was  seen by Lauren on 04/09/23, he would like to get an antibiotic sent over to   [facility] [address] [place] - [address] [place] [address] Phone: [phone]  Fax: [phone]   If he needs to come in, he will. Please advise pt at [phone]

## 2023-04-13 NOTE — Telephone Encounter (Signed)
Insurance called back and this PA has been approved from 03/02/23 till 04/12/24.

## 2023-04-14 MED ORDER — AMOXICILLIN-POT CLAVULANATE 875-125 MG PO TABS: 1.0000 | ORAL_TABLET | Freq: Two times a day (BID) | ORAL | 0 refills | Status: DC

## 2023-04-14 NOTE — Telephone Encounter (Signed)
Patient notified that an antibiotic was sent into pharmacy

## 2023-04-14 NOTE — Telephone Encounter (Signed)
I called and notified patient that insurance has approved his Rx of Omega 3. He said that they called him too.

## 2023-04-15 ENCOUNTER — Ambulatory Visit: Payer: Medicare Other | Admitting: Urology

## 2023-04-15 ENCOUNTER — Encounter: Payer: Self-pay | Admitting: Urology

## 2023-04-15 VITALS — BP 119/66 | HR 74 | Ht 68.5 in | Wt 185.0 lb

## 2023-04-15 DIAGNOSIS — N401 Enlarged prostate with lower urinary tract symptoms: Secondary | ICD-10-CM

## 2023-04-15 DIAGNOSIS — R7989 Other specified abnormal findings of blood chemistry: Secondary | ICD-10-CM

## 2023-04-15 DIAGNOSIS — E291 Testicular hypofunction: Secondary | ICD-10-CM

## 2023-04-15 DIAGNOSIS — N529 Male erectile dysfunction, unspecified: Secondary | ICD-10-CM

## 2023-04-15 DIAGNOSIS — R972 Elevated prostate specific antigen [PSA]: Secondary | ICD-10-CM | POA: Diagnosis not present

## 2023-04-15 DIAGNOSIS — N138 Other obstructive and reflux uropathy: Secondary | ICD-10-CM | POA: Diagnosis not present

## 2023-04-15 NOTE — Progress Notes (Signed)
Assessment: 1. Elevated PSA   2. BPH with obstruction/lower urinary tract symptoms   3. Low testosterone   4. Organic impotence     Plan: Continue tamsulosin 0.4 mg daily for BPH Repeat PSA in 6 weeks - will call with results Discussed further evaluation with prostate biopsy vs MRI for elevated PSA  Chief Complaint:  Chief Complaint  Patient presents with   Elevated PSA    History of Present Illness:  Richard Harding is a 77 y.o. male who is seen for further evaluation of elevated PSA, BPH with obstruction, and recent UTI. PSA results: 9/08 3.52 10/13 8.08 1/14 6.54 3/17 9.05 4/17 9.15 3/24 28.45  He has previously been evaluated for an elevated PSA by Dr. Wanda Plump in 2008 and again by Dr. Mena Goes in November 2013. No prior prostate biopsy.  No family history of prostate cancer. He reported lower urinary tract symptoms including frequency, occasional urgency, and nocturia x 2.  He was not having any incontinence.  No dysuria or gross hematuria. IPSS = 16. PVR = 181 ml.  He also reports a history of erectile dysfunction and decreased libido.  He has previously used Viagra and Cialis with adequate results.  He is inquiring about testosterone replacement therapy. Testosterone level from 7/23 was 271.  Urine culture from 3/24 grew >100 K staph epidermidis.  He was treated with Bactrim for 7 days. He was also started on tamsulosin 0.4 mg daily for his BPH with obstructive symptoms.  He returns today for follow-up.  He completed his antibiotics.  He continues on tamsulosin.  He reports improvement in his urinary symptoms with the tamsulosin.  He has had decreased frequency, urgency, and nocturia.  He feels like he is emptying his bladder more completely.  No dysuria or gross hematuria. IPSS = 10 today.  Portions of the above documentation were copied from a prior visit for review purposes only.   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  Social History:  Social History   Tobacco Use   Smoking status: Never   Smokeless tobacco: Never  Vaping Use   Vaping Use: Never used  Substance Use Topics   Alcohol use: No   Drug use: No    ROS: Constitutional:  Negative for fever, chills, weight loss CV: Negative for chest pain, previous MI, hypertension Respiratory:  Negative for shortness of breath, wheezing, sleep apnea, frequent cough GI:  Negative for nausea, vomiting, bloody stool, GERD  Physical exam: BP 119/66   Pulse 74   Ht 5' 8.5" (1.74 m)   Wt 185 lb (83.9 kg)   BMI 27.72 kg/m  GENERAL APPEARANCE:  Well appearing, well developed, well nourished, NAD HEENT:  Atraumatic, normocephalic, oropharynx clear NECK:  Supple without lymphadenopathy or thyromegaly ABDOMEN:  Soft, non-tender, no masses EXTREMITIES:  Moves all extremities well, without clubbing, cyanosis, or edema NEUROLOGIC:  Alert and oriented x 3, normal gait, CN II-XII grossly intact MENTAL STATUS:  appropriate BACK:  Non-tender to  palpation, No CVAT SKIN:  Warm, dry, and intact  Results: U/A: Negative  PVR: 58 ml

## 2023-04-16 LAB — URINALYSIS, ROUTINE W REFLEX MICROSCOPIC
Bilirubin, UA: NEGATIVE
Glucose, UA: NEGATIVE
Leukocytes,UA: NEGATIVE
Nitrite, UA: NEGATIVE
Protein,UA: NEGATIVE
RBC, UA: NEGATIVE
Specific Gravity, UA: 1.025 (ref 1.005–1.030)
Urobilinogen, Ur: 0.2 mg/dL (ref 0.2–1.0)
pH, UA: 7 (ref 5.0–7.5)

## 2023-05-26 ENCOUNTER — Ambulatory Visit: Payer: Medicare Other | Admitting: Nurse Practitioner

## 2023-05-27 ENCOUNTER — Other Ambulatory Visit: Payer: Self-pay

## 2023-05-27 ENCOUNTER — Other Ambulatory Visit: Payer: Medicare Other

## 2023-05-27 DIAGNOSIS — R972 Elevated prostate specific antigen [PSA]: Secondary | ICD-10-CM | POA: Diagnosis not present

## 2023-05-27 DIAGNOSIS — N138 Other obstructive and reflux uropathy: Secondary | ICD-10-CM | POA: Diagnosis not present

## 2023-05-27 DIAGNOSIS — N401 Enlarged prostate with lower urinary tract symptoms: Secondary | ICD-10-CM | POA: Diagnosis not present

## 2023-05-28 ENCOUNTER — Other Ambulatory Visit: Payer: Self-pay | Admitting: Urology

## 2023-05-28 DIAGNOSIS — R972 Elevated prostate specific antigen [PSA]: Secondary | ICD-10-CM

## 2023-05-28 LAB — PSA, TOTAL AND FREE
PSA, Free Pct: 12 %
PSA, Free: 3.67 ng/mL
Prostate Specific Ag, Serum: 30.6 ng/mL — ABNORMAL HIGH (ref 0.0–4.0)

## 2023-06-25 ENCOUNTER — Ambulatory Visit (INDEPENDENT_AMBULATORY_CARE_PROVIDER_SITE_OTHER): Payer: Medicare Other | Admitting: Nurse Practitioner

## 2023-06-25 ENCOUNTER — Telehealth: Payer: Self-pay

## 2023-06-25 ENCOUNTER — Encounter: Payer: Self-pay | Admitting: Nurse Practitioner

## 2023-06-25 VITALS — BP 100/63 | HR 72 | Temp 98.0°F | Resp 16 | Ht 68.5 in | Wt 193.0 lb

## 2023-06-25 DIAGNOSIS — E1151 Type 2 diabetes mellitus with diabetic peripheral angiopathy without gangrene: Secondary | ICD-10-CM

## 2023-06-25 DIAGNOSIS — E785 Hyperlipidemia, unspecified: Secondary | ICD-10-CM

## 2023-06-25 DIAGNOSIS — Z7984 Long term (current) use of oral hypoglycemic drugs: Secondary | ICD-10-CM | POA: Diagnosis not present

## 2023-06-25 LAB — POCT GLYCOSYLATED HEMOGLOBIN (HGB A1C): Hemoglobin A1C: 6.8 % — AB (ref 4.0–5.6)

## 2023-06-25 MED ORDER — OMEGA-3-ACID ETHYL ESTERS 1 G PO CAPS: 1.0000 g | ORAL_CAPSULE | Freq: Four times a day (QID) | ORAL | 1 refills | Status: DC

## 2023-06-25 NOTE — Assessment & Plan Note (Signed)
Chronic, stable. A1c today was 6.8%. Continue metformin XR 500mg  daily and Januvia 100mg  daily. Foot exam was normal. He would like to sign up for the free diabetic retinopathy clinic at our office in October, information given to the scheduler. Follow-up in 3 months.

## 2023-06-25 NOTE — Progress Notes (Signed)
Established Patient Office Visit  Subjective   Patient ID: Richard Harding, male    DOB: 1946-08-08  Age: 77 y.o. MRN: [id number]  Chief Complaint  Patient presents with   Diabetes    Refill for Omega 3     HPI  [facility] is here to follow-up on hyperlipidemia, diabetes, and elevated PSA.   He states that he is doing well. He has not been checking his blood sugars at home. He denies chest pain, shortness of breath, and changes in vision. He has been taking metformin XR 500mg  daily and januvia 100mg  daily without any side effects.   He also notes that he is still following with urology. He was prescribed antibiotics and flomax for his elevated PSA, however it is still elevated. He has a MRI of his prostate scheduled for July 25. He has noted that the flomax has helped with his urination and he is continuing this medication.   ROS See pertinent positives and negatives per HPI.    Objective:     BP 100/63 (BP Location: Left Arm, Patient Position: Sitting, Cuff Size: Large)   Pulse 72   Temp 98 F (36.7 C) (Temporal)   Resp 16   Ht 5' 8.5" (1.74 m)   Wt 193 lb (87.5 kg)   SpO2 98%   BMI 28.92 kg/m     Physical Exam Vitals and nursing note reviewed.  Constitutional:      Appearance: Normal appearance.  HENT:     Head: Normocephalic.  Eyes:     Conjunctiva/sclera: Conjunctivae normal.  Cardiovascular:     Rate and Rhythm: Normal rate and regular rhythm.     Pulses: Normal pulses.     Heart sounds: Normal heart sounds.  Pulmonary:     Effort: Pulmonary effort is normal.     Breath sounds: Normal breath sounds.  Musculoskeletal:     Cervical back: Normal range of motion.  Skin:    General: Skin is warm.  Neurological:     General: No focal deficit present.     Mental Status: He is alert and oriented to person, place, and time.  Psychiatric:        Mood and Affect: Mood normal.        Behavior: Behavior normal.        Thought Content: Thought  content normal.        Judgment: Judgment normal.     The 10-year ASCVD risk score (Arnett DK, et al., 2019) is: 36.6%    Assessment & Plan:   Problem List Items Addressed This Visit       Cardiovascular and Mediastinum   DM (diabetes mellitus) type II, controlled, with peripheral vascular disorder ([facility]) - Primary    Chronic, stable. A1c today was 6.8%. Continue metformin XR 500mg  daily and Januvia 100mg  daily. Foot exam was normal. He would like to sign up for the free diabetic retinopathy clinic at our office in October, information given to the scheduler. Follow-up in 3 months.       Relevant Medications   omega-3 acid ethyl esters (LOVAZA) 1 g capsule   Other Relevant Orders   POCT glycosylated hemoglobin (Hb A1C) (Completed)     Other   Hyperlipidemia LDL goal <70    Chronic, stable. Continue atorvastatin 10mg  daily, fenofibrate 54mg  daily, and lovaza 1g 4 times a day. Refill sent to the pharmacy. Follow-up in 3 months.       Relevant Medications   omega-3 acid ethyl  esters (LOVAZA) 1 g capsule    Return in about 3 months (around 09/25/2023) for Diabetes.    Gerre Scull, NP

## 2023-06-25 NOTE — Patient Instructions (Signed)
It was great to see you!  I have refilled your omega 3.   We will call your insurance about the MRI and the date.   You have an eye at our office on 10/07/23 at 3pm  Let's follow-up in 3 months, sooner if you have concerns.  If a referral was placed today, you will be contacted for an appointment. Please note that routine referrals can sometimes take up to 3-4 weeks to process. Please call our office if you haven't heard anything after this time frame.  Take care,  Rodman Pickle, NP

## 2023-06-25 NOTE — Assessment & Plan Note (Signed)
Chronic, stable. Continue atorvastatin 10mg  daily, fenofibrate 54mg  daily, and lovaza 1g 4 times a day. Refill sent to the pharmacy. Follow-up in 3 months.

## 2023-06-25 NOTE — Telephone Encounter (Addendum)
Per Minus Liberty with [employer],  a new authorization is needed if he wants the approval dates extended.   Approval dates are 6/11-7/10 and Shirlee Latch is scheduled for 7/25

## 2023-06-29 ENCOUNTER — Telehealth: Payer: Self-pay | Admitting: Urology

## 2023-06-29 NOTE — Telephone Encounter (Signed)
Patient calling again regarding Authorization for MRI. Was authorized for the wrong date.

## 2023-07-15 ENCOUNTER — Ambulatory Visit
Admission: RE | Admit: 2023-07-15 | Discharge: 2023-07-15 | Disposition: A | Discharge status: Home / Self Care | Payer: Medicare Other | Source: Ambulatory Visit | Attending: Urology | Admitting: Urology

## 2023-07-15 DIAGNOSIS — R972 Elevated prostate specific antigen [PSA]: Secondary | ICD-10-CM

## 2023-07-15 MED ORDER — GADOPICLENOL 0.5 MMOL/ML IV SOLN
8.0000 mL | Freq: Once | INTRAVENOUS | Status: AC | PRN
  Administered 2023-07-15: 8 mL via INTRAVENOUS

## 2023-07-19 ENCOUNTER — Ambulatory Visit (INDEPENDENT_AMBULATORY_CARE_PROVIDER_SITE_OTHER): Payer: Medicare Other

## 2023-07-19 VITALS — BP 120/60 | HR 69 | Temp 98.3°F | Ht 67.0 in | Wt 193.6 lb

## 2023-07-19 DIAGNOSIS — Z0000 Encounter for general adult medical examination without abnormal findings: Secondary | ICD-10-CM

## 2023-07-19 NOTE — Patient Instructions (Signed)
[name] , Thank you for taking time to come for your Medicare Wellness Visit. I appreciate your ongoing commitment to your health goals. Please review the following plan we discussed and let me know if I can assist you in the future.   Referrals/Orders/Follow-Ups/Clinician Recommendations: none  This is a list of the screening recommended for you and due dates:  Health Maintenance  Topic Date Due   COVID-19 Vaccine (5 - 2023-24 season) 04/20/2023   Flu Shot  07/22/2023   Eye exam for diabetics  07/24/2023   Hemoglobin A1C  12/26/2023   Yearly kidney function blood test for diabetes  02/22/2024   Yearly kidney health urinalysis for diabetes  02/22/2024   Complete foot exam   06/24/2024   Medicare Annual Wellness Visit  07/18/2024   DTaP/Tdap/Td vaccine (3 - Td or Tdap) 05/24/2032   Pneumonia Vaccine  Completed   Hepatitis C Screening  Completed   Zoster (Shingles) Vaccine  Completed   HPV Vaccine  Aged Out   Colon Cancer Screening  Discontinued   Cologuard (Stool DNA test)  Discontinued    Advanced directives: (In Chart) A copy of your advanced directives are scanned into your chart should your provider ever need it.  Next Medicare Annual Wellness Visit scheduled for next year: No, patient does not want to schedule at this time.  Preventive Care 33 Years and Older, Male  Preventive care refers to lifestyle choices and visits with your health care provider that can promote health and wellness. What does preventive care include? A yearly physical exam. This is also called an annual well check. Dental exams once or twice a year. Routine eye exams. Ask your health care provider how often you should have your eyes checked. Personal lifestyle choices, including: Daily care of your teeth and gums. Regular physical activity. Eating a healthy diet. Avoiding tobacco and drug use. Limiting alcohol use. Practicing safe sex. Taking low doses of aspirin every day. Taking vitamin and  mineral supplements as recommended by your health care provider. What happens during an annual well check? The services and screenings done by your health care provider during your annual well check will depend on your age, overall health, lifestyle risk factors, and family history of disease. Counseling  Your health care provider may ask you questions about your: Alcohol use. Tobacco use. Drug use. Emotional well-being. Home and relationship well-being. Sexual activity. Eating habits. History of falls. Memory and ability to understand (cognition). Work and work Astronomer. Screening  You may have the following tests or measurements: Height, weight, and BMI. Blood pressure. Lipid and cholesterol levels. These may be checked every 5 years, or more frequently if you are over 62 years old. Skin check. Lung cancer screening. You may have this screening every year starting at age 76 if you have a 30-pack-year history of smoking and currently smoke or have quit within the past 15 years. Fecal occult blood test (FOBT) of the stool. You may have this test every year starting at age 76 Flexible sigmoidoscopy or colonoscopy. You may have a sigmoidoscopy every 5 years or a colonoscopy every 10 years starting at age 76 Prostate cancer screening. Recommendations will vary depending on your family history and other risks. Hepatitis C blood test. Hepatitis B blood test. Sexually transmitted disease (STD) testing. Diabetes screening. This is done by checking your blood sugar (glucose) after you have not eaten for a while (fasting). You may have this done every 1-3 years. Abdominal aortic aneurysm (AAA) screening. You  may need this if you are a current or former smoker. Osteoporosis. You may be screened starting at age 76 if you are at high risk. Talk with your health care provider about your test results, treatment options, and if necessary, the need for more tests. Vaccines  Your health care  provider may recommend certain vaccines, such as: Influenza vaccine. This is recommended every year. Tetanus, diphtheria, and acellular pertussis (Tdap, Td) vaccine. You may need a Td booster every 10 years. Zoster vaccine. You may need this after age 76 Pneumococcal 13-valent conjugate (PCV13) vaccine. One dose is recommended after age 76 Pneumococcal polysaccharide (PPSV23) vaccine. One dose is recommended after age 76 Talk to your health care provider about which screenings and vaccines you need and how often you need them. This information is not intended to replace advice given to you by your health care provider. Make sure you discuss any questions you have with your health care provider. Document Released: 01/03/2016 Document Revised: 08/26/2016 Document Reviewed: 10/08/2015 Elsevier Interactive Patient Education  2017 [employer].  Fall Prevention in the Home Falls can cause injuries. They can happen to people of all ages. There are many things you can do to make your home safe and to help prevent falls. What can I do on the outside of my home? Regularly fix the edges of walkways and driveways and fix any cracks. Remove anything that might make you trip as you walk through a door, such as a raised step or threshold. Trim any bushes or trees on the path to your home. Use bright outdoor lighting. Clear any walking paths of anything that might make someone trip, such as rocks or tools. Regularly check to see if handrails are loose or broken. Make sure that both sides of any steps have handrails. Any raised decks and porches should have guardrails on the edges. Have any leaves, snow, or ice cleared regularly. Use sand or salt on walking paths during winter. Clean up any spills in your garage right away. This includes oil or grease spills. What can I do in the bathroom? Use night lights. Install grab bars by the toilet and in the tub and shower. Do not use towel bars as grab  bars. Use non-skid mats or decals in the tub or shower. If you need to sit down in the shower, use a plastic, non-slip stool. Keep the floor dry. Clean up any water that spills on the floor as soon as it happens. Remove soap buildup in the tub or shower regularly. Attach bath mats securely with double-sided non-slip rug tape. Do not have throw rugs and other things on the floor that can make you trip. What can I do in the bedroom? Use night lights. Make sure that you have a light by your bed that is easy to reach. Do not use any sheets or blankets that are too big for your bed. They should not hang down onto the floor. Have a firm chair that has side arms. You can use this for support while you get dressed. Do not have throw rugs and other things on the floor that can make you trip. What can I do in the kitchen? Clean up any spills right away. Avoid walking on wet floors. Keep items that you use a lot in easy-to-reach places. If you need to reach something above you, use a strong step stool that has a grab bar. Keep electrical cords out of the way. Do not use floor polish or wax that  makes floors slippery. If you must use wax, use non-skid floor wax. Do not have throw rugs and other things on the floor that can make you trip. What can I do with my stairs? Do not leave any items on the stairs. Make sure that there are handrails on both sides of the stairs and use them. Fix handrails that are broken or loose. Make sure that handrails are as long as the stairways. Check any carpeting to make sure that it is firmly attached to the stairs. Fix any carpet that is loose or worn. Avoid having throw rugs at the top or bottom of the stairs. If you do have throw rugs, attach them to the floor with carpet tape. Make sure that you have a light switch at the top of the stairs and the bottom of the stairs. If you do not have them, ask someone to add them for you. What else can I do to help prevent  falls? Wear shoes that: Do not have high heels. Have rubber bottoms. Are comfortable and fit you well. Are closed at the toe. Do not wear sandals. If you use a stepladder: Make sure that it is fully opened. Do not climb a closed stepladder. Make sure that both sides of the stepladder are locked into place. Ask someone to hold it for you, if possible. Clearly mark and make sure that you can see: Any grab bars or handrails. First and last steps. Where the edge of each step is. Use tools that help you move around (mobility aids) if they are needed. These include: Canes. Walkers. Scooters. Crutches. Turn on the lights when you go into a dark area. Replace any light bulbs as soon as they burn out. Set up your furniture so you have a clear path. Avoid moving your furniture around. If any of your floors are uneven, fix them. If there are any pets around you, be aware of where they are. Review your medicines with your doctor. Some medicines can make you feel dizzy. This can increase your chance of falling. Ask your doctor what other things that you can do to help prevent falls. This information is not intended to replace advice given to you by your health care provider. Make sure you discuss any questions you have with your health care provider. Document Released: 10/03/2009 Document Revised: 05/14/2016 Document Reviewed: 01/11/2015 Elsevier Interactive Patient Education  2017 [employer].

## 2023-07-19 NOTE — Progress Notes (Signed)
Subjective:   Richard Harding is a 77 y.o. male who presents for Medicare Annual/Subsequent preventive examination.  Visit Complete: In person    Review of Systems     Cardiac Risk Factors include: advanced age (>73men, >93 women);diabetes mellitus;dyslipidemia;hypertension;male gender;obesity (BMI >30kg/m2)     Objective:    Today's Vitals   07/19/23 1603  BP: 120/60  Pulse: 69  Temp: 98.3 F (36.8 C)  TempSrc: Oral  SpO2: 96%  Weight: 193 lb 9.6 oz (87.8 kg)  Height: 5\' 7"  (1.702 m)   Body mass index is 30.32 kg/m.     07/19/2023    4:21 PM 10/14/2022    3:28 PM 09/25/2022    2:00 PM 09/15/2022   11:21 AM 04/07/2022    5:01 PM 04/26/2020    5:00 PM 04/17/2020    2:27 PM  Advanced Directives  Does Patient Have a Medical Advance Directive? Yes No No No No No No  Type of Estate agent of [place];Living will        Copy of Healthcare Power of Attorney in Chart? Yes - validated most recent copy scanned in chart (See row information)        Would patient like information on creating a medical advance directive?  No - Patient declined No - Patient declined  Yes (MAU/Ambulatory/Procedural Areas - Information given) No - Patient declined     Current Medications (verified) Outpatient Encounter Medications as of 07/19/2023  Medication Sig   Ascorbic Acid (VITAMIN C) 1000 MG tablet Take 1,000 mg by mouth at bedtime.   aspirin 81 MG chewable tablet Chew 1 tablet (81 mg total) by mouth 2 (two) times daily.   atorvastatin (LIPITOR) 10 MG tablet Take 1 tablet (10 mg total) by mouth daily.   b complex vitamins capsule Take 1 capsule by mouth daily.   chlorthalidone (HYGROTON) 25 MG tablet Take 1 tablet (25 mg total) by mouth daily.   fenofibrate 54 MG tablet Take 1 tablet (54 mg total) by mouth daily.   loratadine (CLARITIN) 10 MG tablet Take 1 tablet (10 mg total) by mouth daily. (Patient taking differently: Take 10 mg by mouth daily. Patient is taking  twice daily)   metFORMIN (GLUCOPHAGE-XR) 500 MG 24 hr tablet Take 1 tablet (500 mg total) by mouth 2 (two) times daily with a meal.   metoprolol tartrate (LOPRESSOR) 100 MG tablet Take 1 tablet (100 mg total) by mouth 2 (two) times daily.   naproxen sodium (ALEVE) 220 MG tablet 1 tablet with food or milk as needed Orally every 12 hrs   omega-3 acid ethyl esters (LOVAZA) 1 g capsule Take 1 capsule (1 g total) by mouth 4 (four) times daily.   OMEGA-3-ACID ETHYL ESTERS PO Take by mouth.   polyvinyl alcohol (LIQUIFILM TEARS) 1.4 % ophthalmic solution Place 1 drop into both eyes as needed for dry eyes.   Red Yeast Rice 600 MG CAPS Take 1 capsule (600 mg total) by mouth daily.   tamsulosin (FLOMAX) 0.4 MG CAPS capsule Take 1 capsule (0.4 mg total) by mouth daily.   triamcinolone ointment (KENALOG) 0.1 % Apply 1 Application topically 2 (two) times daily as needed for rash.   blood glucose meter kit and supplies KIT Check blood glucose once daily. E11.9. (Patient not taking: Reported on 07/19/2023)   Calcium Carbonate-Vit D-Min (CALCIUM 1200 PO) Take 1 tablet by mouth 3 (three) times a week. (Patient not taking: Reported on 07/19/2023)   Continuous Blood Gluc Sensor (DEXCOM  G6 SENSOR) MISC USE AS DIRECTED (Patient not taking: Reported on 07/19/2023)   Continuous Blood Gluc Transmit (DEXCOM G6 TRANSMITTER) MISC 1 each by Does not apply route continuous. (Patient not taking: Reported on 07/19/2023)   GLUCOSA-CHONDR-NA CHONDR-MSM PO Take 1 capsule by mouth at bedtime. (Patient not taking: Reported on 07/19/2023)   Multiple Vitamin (MULTIVITAMIN WITH MINERALS) TABS tablet Take 1 tablet by mouth daily. (Patient not taking: Reported on 07/19/2023)   oxyCODONE (OXY IR/ROXICODONE) 5 MG immediate release tablet Take 1 tablet (5 mg total) by mouth 2 (two) times daily as needed for severe pain. (Patient not taking: Reported on 07/19/2023)   Potassium Bicarbonate 99 MG CAPS Take 99 mg by mouth daily. (Patient not taking:  Reported on 07/19/2023)   potassium chloride (KLOR-CON) 10 MEQ tablet Take 1 tablet (10 mEq total) by mouth daily. (Patient not taking: Reported on 07/19/2023)   Prenatal Vit-Fe Fumarate-FA (PRENATAL VITAMIN PO) Take 1 tablet by mouth daily. (Patient not taking: Reported on 07/19/2023)   sitaGLIPtin (JANUVIA) 100 MG tablet Take 1 tablet (100 mg total) by mouth daily. (Patient not taking: Reported on 07/19/2023)   No facility-administered encounter medications on file as of 07/19/2023.    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 No pertinent family history. Social History   Socioeconomic History   Marital status: Single    Spouse name: Not on file   Number of children: 0   Years of education: Not on file   Highest education level: Not on file  Occupational History   Occupation: retired  Tobacco Use   Smoking status: Never   Smokeless tobacco: Never  Vaping Use   Vaping status: Never Used  Substance and Sexual Activity   Alcohol use: No   Drug use: No   Sexual activity: Not on file  Other Topics Concern   Not on file  Social History Narrative   Single - lives alone - no children - no family nearby    Social Determinants of Health   Financial Resource Strain: Low Risk  (07/19/2023)   Overall Financial Resource Strain (CARDIA)    Difficulty of Paying Living Expenses: Not hard at all  Food Insecurity: No Food Insecurity (07/19/2023)   Hunger Vital Sign    Worried About Running Out of Food in the Last Year: Never true    Ran Out of Food in the Last Year: Never true  Transportation Needs: No Transportation Needs (07/19/2023)   PRAPARE - Administrator, Civil Service (Medical): No    Lack of Transportation (Non-Medical): No  Physical Activity: Inactive (07/19/2023)   Exercise Vital Sign    Days of Exercise  per Week: 0 days    Minutes of Exercise per Session: 0 min  Stress: No Stress Concern Present (07/19/2023)   [employer] of Occupational Health - Occupational Stress Questionnaire    Feeling of Stress : Only a little  Social Connections: Socially Isolated (07/19/2023)   Social Connection and Isolation Panel [NHANES]    Frequency of Communication with Friends and Family: More than three times a week    Frequency of Social Gatherings with Friends and Family: Once a week    Attends Religious Services: Never    Database administrator or Organizations: No    Attends Engineer, structural: Never    Marital Status: Divorced    Tobacco Counseling Counseling given: Not Answered   Clinical Intake:  Pre-visit preparation completed: Yes  Pain : No/denies pain     Nutritional Status: BMI > 30  Obese Nutritional Risks: None Diabetes: Yes CBG done?: Yes CBG resulted in Enter/ Edit results?: Yes (102) Did pt. bring in CBG monitor from home?: No  How often do you need to have someone help you when you read instructions, pamphlets, or other written materials from your doctor or pharmacy?: 1 - Never  Interpreter Needed?: No  Information entered by :: NAllen LPN   Activities of Daily Living    07/19/2023    4:07 PM 09/25/2022    2:00 PM  In your present  state of health, do you have any difficulty performing the following activities:  Hearing? 0 0  Vision? 0 0  Difficulty concentrating or making decisions? 0 0  Walking or climbing stairs? 1 1  Comment moves slow   Dressing or bathing? 0 0  Doing errands, shopping? 0 0  Preparing Food and eating ? N   Using the Toilet? N   In the past six months, have you accidently leaked urine? N   Do you have problems with loss of bowel control? N   Managing your Medications? N   Managing your Finances? N   Housekeeping or managing your Housekeeping? N     Patient Care Team: Gerre Scull, NP as PCP - General (Internal Medicine) Ernesto Rutherford, MD as Consulting Physician (Ophthalmology) Gaspar Cola, [facility] (Inactive) as Pharmacist (Pharmacist)  Indicate any recent Medical Services you may have received from other than [facility] providers in the past year (date may be approximate).     Assessment:   This is a routine wellness examination for Richard Harding.  Hearing/Vision screen Hearing Screening - Comments:: Denies hearing issues Vision Screening - Comments:: Regular eye exams, [facility]  Dietary issues and exercise activities discussed:     Goals Addressed             This Visit's Progress    Patient Stated       07/19/2023, Denies any goals at this time       Depression Screen    07/19/2023    4:24 PM 02/22/2023    2:52 PM 04/07/2022    5:02 PM 09/04/2020    3:12 PM 01/12/2019   11:48 AM 06/09/2018   12:22 PM 12/24/2016   11:27 AM  PHQ 2/9 Scores  PHQ - 2 Score 0 2 0 0 0 0 0  PHQ- 9 Score 1 3         Fall Risk    07/19/2023    4:23 PM 02/22/2023    2:52 PM 04/07/2022    4:03 PM 03/20/2021   11:19 AM 09/04/2020    3:12 PM  Fall Risk   Falls in the past year? 0 0 0 0 1  Number falls in past yr: 0 0 0  0  Injury with Fall? 0 0 0  0  Risk for fall due to : Medication side effect No Fall Risks No Fall Risks    Follow up Falls prevention discussed;Falls evaluation completed Falls  evaluation completed Falls prevention discussed      MEDICARE RISK AT HOME:  Medicare Risk at Home - 07/19/23 1623     Any stairs in or around the home? Yes    If so, are there any without handrails? No    Home free of loose throw rugs in walkways, pet beds, electrical cords, etc? Yes    Adequate lighting in your home to reduce risk of falls? Yes    Life alert? No    Use of a cane, walker or w/c? No    Grab bars in the bathroom? Yes    Shower chair or bench in shower? Yes    Elevated toilet seat or a handicapped toilet? No             TIMED UP AND GO:  Was the test performed?  Yes  Length of time to ambulate 10 feet: 7 sec Gait slow and steady without use of assistive device    Cognitive Function:        07/19/2023    4:26 PM 04/07/2022    4:36 PM  6CIT Screen  What Year? 0 points 0 points  What month? 0 points 0 points  What time? 0 points 0 points  Count back from 20 0 points 0 points  Months in reverse 2 points 0 points  Repeat phrase 2 points 0 points  Total Score 4 points 0 points    Immunizations Immunization History  Administered Date(s) Administered   Fluad Quad(high Dose 65+) 09/04/2020   Influenza, High Dose Seasonal PF 09/16/2015, 09/14/2017, 08/31/2019   Influenza-Unspecified 12/01/2018   Moderna Covid-19 Vaccine Bivalent Booster 2yrs & up 05/24/2022, 02/23/2023   Moderna Sars-Covid-2 Vaccination 02/03/2020, 03/02/2020   Pneumococcal Conjugate-13 09/16/2015   Pneumococcal Polysaccharide-23 01/18/2014   Td 05/24/2022   Tdap 01/17/2012   Zoster Recombinant(Shingrix) 11/25/2018, 05/28/2019   Zoster, Live 02/21/2014    TDAP status: Up to date  Flu Vaccine status: Up to date  Pneumococcal vaccine status: Up to date  Covid-19 vaccine status: Completed vaccines  Qualifies for Shingles Vaccine? Yes   Zostavax completed Yes   Shingrix Completed?: Yes  Screening Tests Health Maintenance  Topic Date Due   COVID-19 Vaccine (5 - 2023-24  season) 04/20/2023   INFLUENZA VACCINE  07/22/2023   OPHTHALMOLOGY EXAM  07/24/2023   HEMOGLOBIN A1C  12/26/2023   Diabetic kidney evaluation - eGFR measurement  02/22/2024   Diabetic kidney evaluation - Urine ACR  02/22/2024   FOOT EXAM  06/24/2024   Medicare Annual Wellness (AWV)  07/18/2024   DTaP/Tdap/Td (3 - Td or Tdap) 05/24/2032   Pneumonia Vaccine 70+ Years old  Completed   Hepatitis C Screening  Completed   Zoster Vaccines- Shingrix  Completed   HPV VACCINES  Aged Out   Colonoscopy  Discontinued   Fecal DNA (Cologuard)  Discontinued    Health Maintenance  Health Maintenance Due  Topic Date Due   COVID-19 Vaccine (5 - 2023-24 season) 04/20/2023    Colorectal cancer screening: No longer required.   Lung Cancer Screening: (Low Dose CT Chest recommended if Age 50-80 years, 20 pack-year currently smoking  OR have quit w/in 15years.) does not qualify.   Lung Cancer Screening Referral: no  Additional Screening:  Hepatitis C Screening: does qualify; Completed 09/16/2015  Vision Screening: Recommended annual ophthalmology exams for early detection of glaucoma and other disorders of the eye. Is the patient up to date with their annual eye exam?  Yes  Who is the provider or what is the name of the office in which the patient attends annual eye exams? [facility] If pt is not established with a provider, would they like to be referred to a provider to establish care? No .   Dental Screening: Recommended annual dental exams for proper oral hygiene  Diabetic Foot Exam: Diabetic Foot Exam: Completed 06/25/2023  Community Resource Referral / Chronic Care Management: CRR required this visit?  No   CCM required this visit?  No     Plan:     I have personally reviewed and noted the following in the patient's chart:   Medical and social history Use of alcohol, tobacco or illicit drugs  Current medications and supplements including opioid prescriptions. Patient is not currently  taking opioid prescriptions. Functional ability and status Nutritional status Physical activity Advanced directives List of other physicians Hospitalizations, surgeries, and ER visits in previous 12 months Vitals Screenings to include cognitive, depression, and falls Referrals and appointments  In addition, I have reviewed and discussed with patient certain preventive protocols, quality metrics, and best practice recommendations. A written personalized care plan for preventive services as well as general preventive health recommendations were provided to patient.     Barb Merino, LPN   04/03/2439   After Visit Summary: in person  Nurse Notes: none

## 2023-07-29 ENCOUNTER — Other Ambulatory Visit: Payer: Self-pay | Admitting: Urology

## 2023-07-29 DIAGNOSIS — R972 Elevated prostate specific antigen [PSA]: Secondary | ICD-10-CM

## 2023-08-02 ENCOUNTER — Telehealth: Payer: Self-pay | Admitting: Urology

## 2023-08-02 NOTE — Telephone Encounter (Signed)
Wanted to let us know that he wanted Dr Laverle Patter but he wasn't available until November. Scheduled with Dr Berneice Heinrich at [facility].

## 2023-08-22 DIAGNOSIS — C61 Malignant neoplasm of prostate: Secondary | ICD-10-CM

## 2023-08-22 HISTORY — DX: Malignant neoplasm of prostate: C61

## 2023-09-02 DIAGNOSIS — C61 Malignant neoplasm of prostate: Secondary | ICD-10-CM | POA: Diagnosis not present

## 2023-09-02 DIAGNOSIS — R972 Elevated prostate specific antigen [PSA]: Secondary | ICD-10-CM | POA: Diagnosis not present

## 2023-09-02 LAB — PSA: PSA: 97.2

## 2023-09-06 ENCOUNTER — Ambulatory Visit (INDEPENDENT_AMBULATORY_CARE_PROVIDER_SITE_OTHER): Payer: Medicare Other | Admitting: Family Medicine

## 2023-09-06 VITALS — Ht 67.0 in

## 2023-09-06 DIAGNOSIS — Z96652 Presence of left artificial knee joint: Secondary | ICD-10-CM

## 2023-09-06 DIAGNOSIS — M1712 Unilateral primary osteoarthritis, left knee: Secondary | ICD-10-CM | POA: Diagnosis not present

## 2023-09-06 DIAGNOSIS — R058 Other specified cough: Secondary | ICD-10-CM

## 2023-09-06 DIAGNOSIS — Z9889 Other specified postprocedural states: Secondary | ICD-10-CM

## 2023-09-06 MED ORDER — NAPROXEN 500 MG PO TABS
500.0000 mg | ORAL_TABLET | Freq: Two times a day (BID) | ORAL | 0 refills | Status: DC
Start: 2023-09-06 — End: 2023-10-18

## 2023-09-06 MED ORDER — SULFAMETHOXAZOLE-TRIMETHOPRIM 800-160 MG PO TABS
1.0000 | ORAL_TABLET | Freq: Two times a day (BID) | ORAL | 0 refills | Status: DC
Start: 2023-09-06 — End: 2023-10-06

## 2023-09-06 MED ORDER — BENZONATATE 100 MG PO CAPS
100.0000 mg | ORAL_CAPSULE | Freq: Two times a day (BID) | ORAL | 0 refills | Status: DC | PRN
Start: 2023-09-06 — End: 2024-03-14

## 2023-09-06 NOTE — Progress Notes (Signed)
[facility] [address] [place] [address] Dept: [phone] Dept Fax: [phone]  Office Visit  Subjective:    Patient ID: Richard Harding, male    DOB: [birth year], 77 y.o..   MRN: [id number]  Chief Complaint  Patient presents with   Fever    Fever/loss of appetite post prostate biopsy. Was prescribed antibiotics for the biopsy and believes he still has symptoms present of infection.   Knee Pain    Left knee still swollen post surgery, would like a second opinion   Cough    PT states they are having seasonal cough, has not reached out to allergist yet, would like to address    History of Present Illness:  Patient is in today with several complaints.  Richard Harding underwent a prostate biopsy on Thursday. He was given two Septra DS tabs, to take one pre-procedure and one post-procedure. He notes that on Friday, he felt generally weak. On Sat., he noted feeling chills. He is unsure if he may have run fever. He still feels a bit under the weather at this point.  Richard Harding has a history of bilateral total knee joint replacements. He notes that he ahs been having some persistent swelling and pain in the left knee. He has not been back to see Dr. Vedia Coffer, his orthopedist, noting various reasons. He has been rubbing a generic Voltaren gel into the knee, but admits he doesn't always remember to do this.  Richard Harding notes he has a chronic cough. he relates this to allergy issues. He was recently given an allergy pill, but has not noted much improvement. He has no smoking history. He as an upcoming trip to [place]. He is concerned about coughing while there staying with family.  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   Elevated PSA 02/23/2023   Left leg swelling 10/05/2022    History of left knee replacement 09/25/2022   Chest pain 05/26/2022   Unilateral primary osteoarthritis, left knee 04/08/2022   Arthritis of left knee 03/26/2022   Rash 03/26/2022   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/26/2020   Hypokalemia 01/12/2019   DM (diabetes mellitus) type II, controlled, with peripheral vascular disorder (HCC) 09/16/2017   Essential hypertension 01/18/2014   Hyperlipidemia LDL goal <70 01/18/2014   Obesity (BMI 30-39.9) 01/18/2014   Past Surgical History:  Procedure Laterality Date   BILATERAL CARPAL TUNNEL RELEASE     CERVICAL FUSION     COLONOSCOPY  2011   TOTAL KNEE ARTHROPLASTY Right 04/26/2020   Procedure: RIGHT TOTAL KNEE ARTHROPLASTY;  Surgeon: Kathryne Hitch, MD;  Location: [facility];  Service: Orthopedics;  Laterality: Right;   TOTAL KNEE ARTHROPLASTY Left 09/25/2022   Procedure: LEFT TOTAL KNEE ARTHROPLASTY;  Surgeon: Kathryne Hitch, MD;  Location: [facility];  Service: Orthopedics;  Laterality: Left;   No family history on file. Outpatient Medications Prior to Visit  Medication Sig Dispense Refill   Ascorbic Acid (VITAMIN C) 1000 MG tablet Take 1,000 mg by mouth at bedtime.     aspirin 81 MG chewable tablet Chew 1 tablet (81 mg total) by mouth 2 (two) times daily. 30 tablet 0   atorvastatin (LIPITOR) 10 MG tablet Take 1 tablet (10 mg total) by mouth daily. 90 tablet 3   b complex vitamins capsule Take 1 capsule by  mouth daily.     chlorthalidone (HYGROTON) 25 MG tablet Take 1 tablet (25 mg total) by mouth daily. 90 tablet 3   fenofibrate 54 MG tablet Take 1 tablet (54 mg total) by mouth daily. 90 tablet 3   metFORMIN (GLUCOPHAGE-XR) 500 MG 24 hr tablet Take 1 tablet (500 mg total) by mouth 2 (two) times daily with a meal. 180 tablet 1   metoprolol tartrate (LOPRESSOR) 100 MG tablet Take 1 tablet (100 mg total) by mouth 2 (two) times daily. 180 tablet 3   naproxen sodium (ALEVE) 220 MG tablet 1 tablet with food or milk as needed  Orally every 12 hrs     omega-3 acid ethyl esters (LOVAZA) 1 g capsule Take 1 capsule (1 g total) by mouth 4 (four) times daily. 180 capsule 1   polyvinyl alcohol (LIQUIFILM TEARS) 1.4 % ophthalmic solution Place 1 drop into both eyes as needed for dry eyes.     tamsulosin (FLOMAX) 0.4 MG CAPS capsule Take 1 capsule (0.4 mg total) by mouth daily. 30 capsule 11   triamcinolone ointment (KENALOG) 0.1 % Apply 1 Application topically 2 (two) times daily as needed for rash.     OMEGA-3-ACID ETHYL ESTERS PO Take by mouth. (Patient not taking: Reported on 09/06/2023)     Red Yeast Rice 600 MG CAPS Take 1 capsule (600 mg total) by mouth daily. (Patient not taking: Reported on 09/06/2023)     No facility-administered medications prior to visit.   Allergies  Allergen Reactions   Cinnamon Flavor Other (See Comments)    Cinnamon chewing gum caused blisters on tongue     Objective:   Today's Vitals   09/06/23 1555  Height: 5\' 7"  (1.702 m)   Body mass index is 30.32 kg/m.   General: Well developed, well nourished. No acute distress. Lungs: Clear to auscultation bilaterally. No wheezing, rales, or rhonchi. Extremities: Full ROM. The left knee joint is larger than the right and has increased warmth. No edema noted. Psych: Alert and oriented. Normal mood and affect.  Health Maintenance Due  Topic Date Due   INFLUENZA VACCINE  07/22/2023   OPHTHALMOLOGY EXAM  07/24/2023     Assessment & Plan:   Problem List Items Addressed This Visit       Musculoskeletal and Integument   Arthritis of left knee    I recommend a 7-day course of naproxen. I strongly recommend he follow up with his orthopedist for further evaluation.      Relevant Medications   naproxen (NAPROSYN) 500 MG tablet   Other Visit Diagnoses     History of prostate biopsy    -  Primary   The weakness and chills after the recent biopsy could represent hematogenous showering of bacteria. I recommend we continue the Septra for a  week.   Relevant Medications   sulfamethoxazole-trimethoprim (BACTRIM DS) 800-160 MG tablet   Knee joint replacement status, left       As above.   Other cough       I will provide some Tessalon for him to use to manage his cough while traveling.   Relevant Medications   benzonatate (TESSALON) 100 MG capsule       Return if symptoms worsen or fail to improve.   Loyola Mast, MD

## 2023-09-06 NOTE — Assessment & Plan Note (Signed)
I recommend a 7-day course of naproxen. I strongly recommend he follow up with his orthopedist for further evaluation.

## 2023-09-08 ENCOUNTER — Encounter: Payer: Self-pay | Admitting: Nurse Practitioner

## 2023-09-10 DIAGNOSIS — H524 Presbyopia: Secondary | ICD-10-CM | POA: Diagnosis not present

## 2023-09-10 LAB — HM DIABETES EYE EXAM

## 2023-09-13 ENCOUNTER — Telehealth: Payer: Self-pay | Admitting: Urology

## 2023-09-13 ENCOUNTER — Other Ambulatory Visit: Payer: Self-pay | Admitting: Urology

## 2023-09-13 ENCOUNTER — Encounter: Payer: Self-pay | Admitting: Nurse Practitioner

## 2023-09-13 ENCOUNTER — Encounter: Payer: Self-pay | Admitting: Urology

## 2023-09-13 DIAGNOSIS — C61 Malignant neoplasm of prostate: Secondary | ICD-10-CM

## 2023-09-13 NOTE — Telephone Encounter (Signed)
Pt wants to know about Biopsy results that was done at [facility].  [phone]

## 2023-09-16 ENCOUNTER — Telehealth: Payer: Self-pay | Admitting: Urology

## 2023-09-16 NOTE — Telephone Encounter (Signed)
Returned pts call, pt states Richard Harding had already called him back and taken care of the issue.

## 2023-09-16 NOTE — Telephone Encounter (Signed)
Patient called again about results and requested a call back from Dr Lesia Sago medical assistant

## 2023-09-27 ENCOUNTER — Ambulatory Visit (HOSPITAL_COMMUNITY)
Admission: RE | Admit: 2023-09-27 | Discharge: 2023-09-27 | Disposition: A | Discharge status: Home / Self Care | Payer: Medicare Other | Source: Ambulatory Visit | Attending: Urology | Admitting: Urology

## 2023-09-27 DIAGNOSIS — C61 Malignant neoplasm of prostate: Secondary | ICD-10-CM | POA: Diagnosis not present

## 2023-09-27 MED ORDER — FLOTUFOLASTAT F 18 GALLIUM 296-5846 MBQ/ML IV SOLN
8.1300 | Freq: Once | INTRAVENOUS | Status: AC
  Administered 2023-09-27: 8.13 via INTRAVENOUS

## 2023-09-30 ENCOUNTER — Other Ambulatory Visit: Payer: Self-pay | Admitting: Nurse Practitioner

## 2023-09-30 DIAGNOSIS — E1151 Type 2 diabetes mellitus with diabetic peripheral angiopathy without gangrene: Secondary | ICD-10-CM

## 2023-09-30 NOTE — Telephone Encounter (Signed)
Requesting: metFORMIN HCl ER 500 MG Oral Tablet Extended Release 24 Hour  Last Visit: 09/06/2023 Next Visit: Visit date not found Last Refill: 04/09/2023  Please Advise

## 2023-10-04 ENCOUNTER — Telehealth: Payer: Self-pay | Admitting: Urology

## 2023-10-04 NOTE — Telephone Encounter (Signed)
I called and spoke with [facility] regarding questions he had about his PET scan results. He stated that he would be leaving on Friday to go to [place] and wanted to get the results before he left. I had spoken with him previously regarding his PET scan and assured him that Dr. Pete Glatter would call him with those results before he left. Marcelino Duster

## 2023-10-04 NOTE — Telephone Encounter (Signed)
I read previous communications...  Patient is calling today to let us know that no one has called and spoken to him since he last called and would like a call back from the office manager, CMA, or Dr Pete Glatter.

## 2023-10-05 ENCOUNTER — Other Ambulatory Visit: Payer: Self-pay | Admitting: Urology

## 2023-10-05 ENCOUNTER — Telehealth: Payer: Self-pay | Admitting: Radiation Oncology

## 2023-10-05 DIAGNOSIS — C61 Malignant neoplasm of prostate: Secondary | ICD-10-CM

## 2023-10-05 NOTE — Telephone Encounter (Signed)
Left message for patient to call back to schedule consult per 10/15 referral.

## 2023-10-06 ENCOUNTER — Encounter: Payer: Self-pay | Admitting: Urology

## 2023-10-06 ENCOUNTER — Ambulatory Visit: Payer: Medicare Other | Admitting: Urology

## 2023-10-06 VITALS — BP 163/74 | HR 76 | Ht 69.0 in | Wt 185.0 lb

## 2023-10-06 DIAGNOSIS — N138 Other obstructive and reflux uropathy: Secondary | ICD-10-CM | POA: Diagnosis not present

## 2023-10-06 DIAGNOSIS — N401 Enlarged prostate with lower urinary tract symptoms: Secondary | ICD-10-CM

## 2023-10-06 DIAGNOSIS — R8290 Unspecified abnormal findings in urine: Secondary | ICD-10-CM

## 2023-10-06 DIAGNOSIS — C61 Malignant neoplasm of prostate: Secondary | ICD-10-CM | POA: Insufficient documentation

## 2023-10-06 LAB — MICROSCOPIC EXAMINATION

## 2023-10-06 LAB — URINALYSIS, ROUTINE W REFLEX MICROSCOPIC
Bilirubin, UA: NEGATIVE
Glucose, UA: NEGATIVE
Ketones, UA: NEGATIVE
Nitrite, UA: POSITIVE — AB
Protein,UA: NEGATIVE
RBC, UA: NEGATIVE
Specific Gravity, UA: 1.025 (ref 1.005–1.030)
Urobilinogen, Ur: 0.2 mg/dL (ref 0.2–1.0)
pH, UA: 6 (ref 5.0–7.5)

## 2023-10-06 MED ORDER — DEGARELIX ACETATE(240 MG DOSE) 120 MG/VIAL ~~LOC~~ SOLR
240.0000 mg | Freq: Once | SUBCUTANEOUS | Status: AC
Start: 2023-10-06 — End: 2023-10-06
  Administered 2023-10-06: 240 mg via SUBCUTANEOUS

## 2023-10-06 MED ORDER — SULFAMETHOXAZOLE-TRIMETHOPRIM 800-160 MG PO TABS
1.0000 | ORAL_TABLET | Freq: Two times a day (BID) | ORAL | 0 refills | Status: AC
Start: 2023-10-06 — End: 2023-10-13

## 2023-10-06 NOTE — Progress Notes (Signed)
Assessment: 1. 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 BPH with obstruction/lower urinary tract symptoms   3. Abnormal urine findings     Plan: I discussed the MRI results, biopsy results, and PSMA PET scan results with the patient today. Given his elevated PSA and biopsy results, he is in the very high risk prostate cancer group.  I discussed the possibility of additional treatment with radiation therapy to the prostate area.  He inquired about surgery, however, I advised him that given the evidence of extracapsular extension on the MRI I would not recommend this as a treatment modality.   He he is getting ready to leave the country for several weeks.  Consequently, I recommended beginning management with ADT.  Potential side effects of ADT discussed with the patient. Loading dose of Firmagon given today. Continue tamsulosin 0.4 mg daily for BPH Urine culture sent today. Bactrim DS twice daily x 7 days.  Prescription sent. Consultation to radiation oncology sent. Return to office in 1 month for next ADT injection.   Chief Complaint:  Chief Complaint  Patient presents with   Prostate Cancer    History of Present Illness:  Richard Harding is a 77 y.o. male who is seen for further evaluation of elevated PSA, BPH with obstruction, and recent UTI. PSA results: 9/08 3.52 10/13 8.08 1/14 6.54 3/17 9.05 4/17 9.15 3/24 28.45  He has previously been evaluated for an elevated PSA by Dr. Wanda Plump in 2008 and again by Dr. Mena Goes in November 2013. No prior prostate biopsy.  No family history of prostate cancer. He reported lower urinary tract symptoms including frequency, occasional urgency, and nocturia x 2.  He was not having any incontinence.  No dysuria or gross hematuria. IPSS = 16. PVR = 181 ml.  He also reports a history of erectile dysfunction and decreased libido.  He has previously used Viagra and Cialis with adequate results.  He is inquiring about  testosterone replacement therapy. Testosterone level from 7/23 was 271.  Urine culture from 3/24 grew >100 K staph epidermidis.  He was treated with Bactrim for 7 days. He was also started on tamsulosin 0.4 mg daily for his BPH with obstructive symptoms.  At his visit in April 2024, he had completed his antibiotics.  He continued on tamsulosin.  He reported improvement in his urinary symptoms with the tamsulosin with decreased frequency, urgency, and nocturia.  He felt like he was emptying his bladder more completely.  No dysuria or gross hematuria. IPSS = 10.  His PSA in June 2024 had increased to 30.7.  He underwent evaluation with a prostate MRI which showed a PI-RADS 5 lesion in the left anterior transition zone and a PI-RADS 3 lesion in the left posterior lateral peripheral zone.  Prostate volume measured 88.56 cm.  He underwent a fusion biopsy on 09/02/2023.  Pathology showed Gleason 4+5 = 9 in 40% of 1 of 2 cores from the left region of interest.  Repeat PSA prior to his biopsy had increased to 97.2. PSMA PET scan obtained on 09/27/2023 showed intense radiotracer activity in the left lobe of the prostate and no evidence of metastatic disease. He presents today for initiation of androgen deprivation therapy for management of his very high risk prostate cancer. He is complaining of some lower abdominal pain.  He continues on tamsulosin.  His lower urinary tract symptoms are stable.  No dysuria or gross hematuria. IPSS = 11 today.  Portions of the above  documentation were copied from a prior visit for review purposes only.   Past Medical History:  Past Medical History:  Diagnosis Date   Arthritis    Arthritis of right knee 04/25/2020   Carpal tunnel syndrome, bilateral    Chickenpox    Diabetes mellitus without complication (HCC)    Hyperlipemia    Hypertension    RBBB 04/17/2020   Noted on EKG    Past Surgical History:  Past Surgical History:  Procedure Laterality Date    BILATERAL CARPAL TUNNEL RELEASE     CERVICAL FUSION     COLONOSCOPY  2011   TOTAL KNEE ARTHROPLASTY Right 04/26/2020   Procedure: RIGHT TOTAL KNEE ARTHROPLASTY;  Surgeon: Kathryne Hitch, MD;  Location: [facility];  Service: Orthopedics;  Laterality: Right;   TOTAL KNEE ARTHROPLASTY Left 09/25/2022   Procedure: LEFT TOTAL KNEE ARTHROPLASTY;  Surgeon: Kathryne Hitch, MD;  Location: [facility];  Service: Orthopedics;  Laterality: Left;    Allergies:  Allergies  Allergen Reactions   Cinnamon Flavor Other (See Comments)    Cinnamon chewing gum caused blisters on tongue    Family History:  No family history on file.  Social History:  Social History   Tobacco Use   Smoking status: Never   Smokeless tobacco: Never  Vaping Use   Vaping status: Never Used  Substance Use Topics   Alcohol use: No   Drug use: No    ROS: Constitutional:  Negative for fever, chills, weight loss CV: Negative for chest pain, previous MI, hypertension Respiratory:  Negative for shortness of breath, wheezing, sleep apnea, frequent cough GI:  Negative for nausea, vomiting, bloody stool, GERD  Physical exam: BP (!) 163/74   Pulse 76   Ht 5\' 9"  (1.753 m)   Wt 185 lb (83.9 kg)   BMI 27.32 kg/m  GENERAL APPEARANCE:  Well appearing, well developed, well nourished, NAD HEENT:  Atraumatic, normocephalic, oropharynx clear NECK:  Supple without lymphadenopathy or thyromegaly ABDOMEN:  Soft, non-tender, no masses EXTREMITIES:  Moves all extremities well, without clubbing, cyanosis, or edema NEUROLOGIC:  Alert and oriented x 3, normal gait, CN II-XII grossly intact MENTAL STATUS:  appropriate BACK:  Non-tender to palpation, No CVAT SKIN:  Warm, dry, and intact  Results: U/A: 6-10 WBC, 0-2 RBC, many bacteria, nitrite +

## 2023-10-06 NOTE — Progress Notes (Signed)
Firmagon Sub Q Injection  Due to Prostate Cancer patient is present today for a Firmagon Injection.   Medication: Deborra Medina (Degarelix)  Dose: 240mg  Location: right and left upper abdomen Lot: [id number] Exp: 01/2025  Patient tolerated well, no complications were noted  Performed by: Arville Go CMA & Christal CMA

## 2023-10-11 ENCOUNTER — Encounter: Payer: Self-pay | Admitting: Urology

## 2023-10-11 LAB — URINE CULTURE

## 2023-10-11 MED ORDER — CEFDINIR 300 MG PO CAPS
300.0000 mg | ORAL_CAPSULE | Freq: Two times a day (BID) | ORAL | 0 refills | Status: DC
Start: 2023-10-11 — End: 2023-10-18

## 2023-10-11 NOTE — Addendum Note (Signed)
Addended by: Milderd Meager on: 10/11/2023 10:25 PM   Modules accepted: Orders

## 2023-10-18 NOTE — Progress Notes (Signed)
GU Location of Tumor / Histology: Prostate Ca  If Prostate Cancer, Gleason Score is (4 + 5) and PSA is (97.20 on 09/02/2023).  PSA 30.6 on 05/27/2023 PSA 28.45 on 02/22/2023  Biopsy     09/27/2023 Dr. [name] NM PET (PSMA) Skull to Mid Thigh CLINICAL DATA: High risk prostate carcinoma.   IMPRESSION: 1. Intense radiotracer activity in the LEFT lobe of the prostate gland consistent with primary prostate adenocarcinoma. 2. No evidence of metastatic adenopathy in the pelvis or periaortic retroperitoneum. 3. No evidence of visceral metastasis or skeletal metastasis.  07/15/2023 Dr. [name] MR Prostate with/without CLINICAL DATA: Elevated PSA level 28.45 on 02/22/2023. R97.20   IMPRESSION: 1. PI-RADS category 5 lesion of the bilateral anterior fibromuscular stroma and left anterior transition zone in the mid gland and base, with involvement of the right anterior transition zone in the mid gland. This bulges the anterior prostate contour compatible with transcapsular spread. 2. Small PI-RADS category 3 lesion of the left posterolateral peripheral zone at the apex. 3.  Targeting data sent to [facility]. 4. Prostatomegaly and benign prostatic hypertrophy. 5. Sigmoid colon diverticulosis.  Past/Anticipated interventions by urology, if any:   Dr. [name]   Past/Anticipated interventions by medical oncology, if any: NA  Weight changes, if any:  No  IPSS:  15 SHIM:  11  Bowel/Bladder complaints, if any:  No bowel issues.  Nausea/Vomiting, if any: No  Pain issues, if any:  3/10 left knee had total knee replacement  SAFETY ISSUES: Prior radiation? No Pacemaker/ICD? No Possible current pregnancy? Male Is the patient on methotrexate? No  Current Complaints / other details:  Taking antibiotics had infection from biopsy per patient.

## 2023-10-22 ENCOUNTER — Encounter: Payer: Self-pay | Admitting: Radiation Oncology

## 2023-10-22 ENCOUNTER — Ambulatory Visit
Admission: RE | Admit: 2023-10-22 | Discharge: 2023-10-22 | Disposition: A | Discharge status: Home / Self Care | Payer: Medicare Other | Source: Ambulatory Visit | Attending: Radiation Oncology | Admitting: Radiation Oncology

## 2023-10-22 VITALS — BP 126/60 | HR 70 | Temp 97.7°F | Resp 20 | Ht 69.0 in | Wt 189.6 lb

## 2023-10-22 DIAGNOSIS — C61 Malignant neoplasm of prostate: Secondary | ICD-10-CM | POA: Diagnosis not present

## 2023-10-22 DIAGNOSIS — Z87442 Personal history of urinary calculi: Secondary | ICD-10-CM | POA: Insufficient documentation

## 2023-10-22 DIAGNOSIS — I10 Essential (primary) hypertension: Secondary | ICD-10-CM | POA: Diagnosis not present

## 2023-10-22 DIAGNOSIS — I4510 Unspecified right bundle-branch block: Secondary | ICD-10-CM | POA: Diagnosis not present

## 2023-10-22 DIAGNOSIS — Z7984 Long term (current) use of oral hypoglycemic drugs: Secondary | ICD-10-CM | POA: Insufficient documentation

## 2023-10-22 DIAGNOSIS — M129 Arthropathy, unspecified: Secondary | ICD-10-CM | POA: Insufficient documentation

## 2023-10-22 DIAGNOSIS — Z191 Hormone sensitive malignancy status: Secondary | ICD-10-CM | POA: Diagnosis not present

## 2023-10-22 DIAGNOSIS — Z79899 Other long term (current) drug therapy: Secondary | ICD-10-CM | POA: Diagnosis not present

## 2023-10-22 DIAGNOSIS — E785 Hyperlipidemia, unspecified: Secondary | ICD-10-CM | POA: Insufficient documentation

## 2023-10-22 DIAGNOSIS — E119 Type 2 diabetes mellitus without complications: Secondary | ICD-10-CM | POA: Insufficient documentation

## 2023-10-22 NOTE — Progress Notes (Signed)
Introduced myself to the patient as the prostate nurse navigator.  No barriers to care identified at this time.  He is here to discuss his radiation treatment options and will proceed with ADT and 8 weeks of radiation.  He started [place] on 10/16.  I gave him my business card and asked him to call me with questions or concerns.  Verbalized understanding.

## 2023-10-25 DIAGNOSIS — C61 Malignant neoplasm of prostate: Secondary | ICD-10-CM | POA: Diagnosis not present

## 2023-10-25 DIAGNOSIS — Z191 Hormone sensitive malignancy status: Secondary | ICD-10-CM | POA: Diagnosis not present

## 2023-10-27 ENCOUNTER — Ambulatory Visit: Payer: Self-pay | Admitting: Urology

## 2023-10-27 ENCOUNTER — Other Ambulatory Visit: Payer: Self-pay | Admitting: Urology

## 2023-10-27 DIAGNOSIS — C61 Malignant neoplasm of prostate: Secondary | ICD-10-CM

## 2023-11-04 ENCOUNTER — Encounter: Payer: Self-pay | Admitting: Urology

## 2023-11-04 ENCOUNTER — Ambulatory Visit: Payer: Medicare Other | Admitting: Urology

## 2023-11-04 VITALS — BP 133/65 | HR 68 | Ht 69.0 in | Wt 189.0 lb

## 2023-11-04 DIAGNOSIS — Z79818 Long term (current) use of other agents affecting estrogen receptors and estrogen levels: Secondary | ICD-10-CM

## 2023-11-04 DIAGNOSIS — C61 Malignant neoplasm of prostate: Secondary | ICD-10-CM | POA: Diagnosis not present

## 2023-11-04 DIAGNOSIS — N401 Enlarged prostate with lower urinary tract symptoms: Secondary | ICD-10-CM

## 2023-11-04 DIAGNOSIS — N138 Other obstructive and reflux uropathy: Secondary | ICD-10-CM | POA: Diagnosis not present

## 2023-11-04 DIAGNOSIS — IMO0001 Reserved for inherently not codable concepts without codable children: Secondary | ICD-10-CM

## 2023-11-04 DIAGNOSIS — R8290 Unspecified abnormal findings in urine: Secondary | ICD-10-CM

## 2023-11-04 LAB — URINALYSIS, ROUTINE W REFLEX MICROSCOPIC
Bilirubin, UA: NEGATIVE
Glucose, UA: NEGATIVE
Ketones, UA: NEGATIVE
Leukocytes,UA: NEGATIVE
Nitrite, UA: POSITIVE — AB
Protein,UA: NEGATIVE
RBC, UA: NEGATIVE
Specific Gravity, UA: 1.025 (ref 1.005–1.030)
Urobilinogen, Ur: 0.2 mg/dL (ref 0.2–1.0)
pH, UA: 6 (ref 5.0–7.5)

## 2023-11-04 LAB — MICROSCOPIC EXAMINATION

## 2023-11-04 MED ORDER — LEUPROLIDE ACETATE (6 MONTH) 45 MG ~~LOC~~ KIT
45.0000 mg | PACK | Freq: Once | SUBCUTANEOUS | Status: AC
  Administered 2023-11-04: 45 mg via SUBCUTANEOUS

## 2023-11-04 NOTE — Progress Notes (Signed)
Assessment: 1. 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 Androgen deprivation therapy   3. BPH with obstruction/lower urinary tract symptoms   4. Abnormal urine findings     Plan: Urine culture sent today  Continue tamsulosin 0.4 mg daily for BPH Continue LT ADT with 6 month Eligard today Discussed use of daily calcium and vit D supplement while on ADT to reduce risk of osteoporosis Scheduled for placement of fiducial markers and SpaceOAR on 11/23/2023. Return to office in 2 months  Procedure: The patient will be scheduled for placement of fiducial markers in prostate and SpaceOAR at [facility] on 11/23/23.  Surgical request is placed with the surgery schedulers and will be scheduled at the patient's/family request. Informed consent is given as documented below. Anesthesia: MAC  The patient does not have sleep apnea, history of MRSA, history of VRE, history of cardiac device requiring special anesthetic needs. Patient is stable and considered clear for surgical in an outpatient ambulatory surgery setting as well as patient hospital setting.  Consent for Operation or Procedure: Provider Certification I hereby certify that the nature, purpose, benefits, usual and most frequent risks of, and alternatives to, the operation or procedure have been explained to the patient (or person authorized to sign for the patient) either by me as responsible physician or by the provider who is to perform the operation or procedure. Time spent such that the patient/family has had an opportunity to ask questions, and that those questions have been answered. The patient or the patient's representative has been advised that selected tasks may be performed by assistants to the primary health care provider(s). I believe that the patient (or person authorized to sign for the patient) understands what has been explained, and has consented to the operation or procedure. No guarantees  were implied or made.   Chief Complaint:  Chief Complaint  Patient presents with   Prostate Cancer    History of Present Illness:  Richard Harding is a 77 y.o. male who is seen for further evaluation of elevated PSA, BPH with obstruction, and recent UTI. PSA results: 9/08 3.52 10/13 8.08 1/14 6.54 3/17 9.05 4/17 9.15 3/24 28.45  He has previously been evaluated for an elevated PSA by Dr. Wanda Plump in 2008 and again by Dr. Mena Goes in November 2013. No prior prostate biopsy.  No family history of prostate cancer. He reported lower urinary tract symptoms including frequency, occasional urgency, and nocturia x 2.  He was not having any incontinence.  No dysuria or gross hematuria. IPSS = 16. PVR = 181 ml.  He also reports a history of erectile dysfunction and decreased libido.  He has previously used Viagra and Cialis with adequate results.  He is inquiring about testosterone replacement therapy. Testosterone level from 7/23 was 271.  Urine culture from 3/24 grew >100 K staph epidermidis.  He was treated with Bactrim for 7 days. He was also started on tamsulosin 0.4 mg daily for his BPH with obstructive symptoms.  At his visit in April 2024, he had completed his antibiotics.  He continued on tamsulosin.  He reported improvement in his urinary symptoms with the tamsulosin with decreased frequency, urgency, and nocturia.  He felt like he was emptying his bladder more completely.  No dysuria or gross hematuria. IPSS = 10.  His PSA in June 2024 had increased to 30.7.  He underwent evaluation with a prostate MRI which showed a PI-RADS 5 lesion in the left anterior  transition zone and a PI-RADS 3 lesion in the left posterior lateral peripheral zone.  Prostate volume measured 88.56 cm.  He underwent a fusion biopsy on 09/02/2023.  Pathology showed Gleason 4+5 = 9 in 40% of 1 of 2 cores from the left region of interest.  Repeat PSA prior to his biopsy had increased to 97.2. PSMA PET scan  obtained on 09/27/2023 showed intense radiotracer activity in the left lobe of the prostate and no evidence of metastatic disease. He was started on androgen deprivation therapy for management of his very high risk prostate cancer with Firmagon on 10/06/23. He reported some lower abdominal pain.  He continued on tamsulosin.  His lower urinary tract symptoms were stable.  No dysuria or gross hematuria. IPSS = 11.  Urine culture from 10/06/2023 grew >100 K E. coli.  He was treated with cefdinir x 7 days. He has seen Dr. Kathrynn Running with radiation oncology and has elected to proceed with external beam radiation for his prostate cancer. He returns today for continuation of his long-term ADT in preparation for external beam radiation. He has tolerated ADT well.  He does have occasional hot flashes. He continues on tamsulosin for his urinary symptoms.  He has frequency, urgency, and incomplete emptying. IPSS = 22. He will also be scheduled for placement of fiducial markers and SpaceOAR on 11/23/2023.  Portions of the above documentation were copied from a prior visit for review purposes only.   Past Medical History:  Past Medical History:  Diagnosis Date   Arthritis    Arthritis of right knee 04/25/2020   Carpal tunnel syndrome, bilateral    Chickenpox    Diabetes mellitus without complication (HCC)    Hyperlipemia    Hypertension    RBBB 04/17/2020   Noted on EKG    Past Surgical History:  Past Surgical History:  Procedure Laterality Date   BILATERAL CARPAL TUNNEL RELEASE     CERVICAL FUSION     COLONOSCOPY  2011   PROSTATE BIOPSY     TOTAL KNEE ARTHROPLASTY Right 04/26/2020   Procedure: RIGHT TOTAL KNEE ARTHROPLASTY;  Surgeon: Kathryne Hitch, MD;  Location: [facility];  Service: Orthopedics;  Laterality: Right;   TOTAL KNEE ARTHROPLASTY Left 09/25/2022   Procedure: LEFT TOTAL KNEE ARTHROPLASTY;  Surgeon: Kathryne Hitch, MD;  Location: [facility];  Service: Orthopedics;   Laterality: Left;    Allergies:  Allergies  Allergen Reactions   Cinnamon Flavor Other (See Comments)    Cinnamon chewing gum caused blisters on tongue    Family History:  History reviewed. No pertinent family history.  Social History:  Social History   Tobacco Use   Smoking status: Never   Smokeless tobacco: Never  Vaping Use   Vaping status: Never Used  Substance Use Topics   Alcohol use: No   Drug use: No    ROS: Constitutional:  Negative for fever, chills, weight loss CV: Negative for chest pain, previous MI, hypertension Respiratory:  Negative for shortness of breath, wheezing, sleep apnea, frequent cough GI:  Negative for nausea, vomiting, bloody stool, GERD  Physical exam: BP 133/65   Pulse 68   Ht 5\' 9"  (1.753 m)   Wt 189 lb (85.7 kg)   BMI 27.91 kg/m  GENERAL APPEARANCE:  Well appearing, well developed, well nourished, NAD HEENT:  Atraumatic, normocephalic, oropharynx clear NECK:  Supple without lymphadenopathy or thyromegaly ABDOMEN:  Soft, non-tender, no masses EXTREMITIES:  Moves all extremities well, without clubbing, cyanosis, or edema NEUROLOGIC:  Alert  and oriented x 3, normal gait, CN II-XII grossly intact MENTAL STATUS:  appropriate BACK:  Non-tender to palpation, No CVAT SKIN:  Warm, dry, and intact  Results: U/A: 0-5 WBC, 0-2 RBC, many bacteria, nitrite +

## 2023-11-04 NOTE — Progress Notes (Signed)
Eligard SubQ Injection   Due to Prostate Cancer patient is present today for a Eligard Injection.  Medication: Eligard 6 month Dose: 45 mg  Location: right upper outer buttocks Lot: [id number] Exp: 11/2024  Patient tolerated well, no complications were noted  Performed by: Arville Go CMA   PA approval dates:  11/04/23 through 11/24/24.

## 2023-11-04 NOTE — H&P (View-Only) (Signed)
 Assessment: 1. 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 Androgen deprivation therapy   3. BPH with obstruction/lower urinary tract symptoms   4. Abnormal urine findings     Plan: Urine culture sent today  Continue tamsulosin 0.4 mg daily for BPH Continue LT ADT with 6 month Eligard today Discussed use of daily calcium and vit D supplement while on ADT to reduce risk of osteoporosis Scheduled for placement of fiducial markers and SpaceOAR on 11/23/2023. Return to office in 2 months  Procedure: The patient will be scheduled for placement of fiducial markers in prostate and SpaceOAR at [facility] on 11/23/23.  Surgical request is placed with the surgery schedulers and will be scheduled at the patient's/family request. Informed consent is given as documented below. Anesthesia: MAC  The patient does not have sleep apnea, history of MRSA, history of VRE, history of cardiac device requiring special anesthetic needs. Patient is stable and considered clear for surgical in an outpatient ambulatory surgery setting as well as patient hospital setting.  Consent for Operation or Procedure: Provider Certification I hereby certify that the nature, purpose, benefits, usual and most frequent risks of, and alternatives to, the operation or procedure have been explained to the patient (or person authorized to sign for the patient) either by me as responsible physician or by the provider who is to perform the operation or procedure. Time spent such that the patient/family has had an opportunity to ask questions, and that those questions have been answered. The patient or the patient's representative has been advised that selected tasks may be performed by assistants to the primary health care provider(s). I believe that the patient (or person authorized to sign for the patient) understands what has been explained, and has consented to the operation or procedure. No guarantees  were implied or made.   Chief Complaint:  Chief Complaint  Patient presents with   Prostate Cancer    History of Present Illness:  Richard Harding is a 77 y.o. male who is seen for further evaluation of elevated PSA, BPH with obstruction, and recent UTI. PSA results: 9/08 3.52 10/13 8.08 1/14 6.54 3/17 9.05 4/17 9.15 3/24 28.45  He has previously been evaluated for an elevated PSA by Dr. Wanda Plump in 2008 and again by Dr. Mena Goes in November 2013. No prior prostate biopsy.  No family history of prostate cancer. He reported lower urinary tract symptoms including frequency, occasional urgency, and nocturia x 2.  He was not having any incontinence.  No dysuria or gross hematuria. IPSS = 16. PVR = 181 ml.  He also reports a history of erectile dysfunction and decreased libido.  He has previously used Viagra and Cialis with adequate results.  He is inquiring about testosterone replacement therapy. Testosterone level from 7/23 was 271.  Urine culture from 3/24 grew >100 K staph epidermidis.  He was treated with Bactrim for 7 days. He was also started on tamsulosin 0.4 mg daily for his BPH with obstructive symptoms.  At his visit in April 2024, he had completed his antibiotics.  He continued on tamsulosin.  He reported improvement in his urinary symptoms with the tamsulosin with decreased frequency, urgency, and nocturia.  He felt like he was emptying his bladder more completely.  No dysuria or gross hematuria. IPSS = 10.  His PSA in June 2024 had increased to 30.7.  He underwent evaluation with a prostate MRI which showed a PI-RADS 5 lesion in the left anterior  transition zone and a PI-RADS 3 lesion in the left posterior lateral peripheral zone.  Prostate volume measured 88.56 cm.  He underwent a fusion biopsy on 09/02/2023.  Pathology showed Gleason 4+5 = 9 in 40% of 1 of 2 cores from the left region of interest.  Repeat PSA prior to his biopsy had increased to 97.2. PSMA PET scan  obtained on 09/27/2023 showed intense radiotracer activity in the left lobe of the prostate and no evidence of metastatic disease. He was started on androgen deprivation therapy for management of his very high risk prostate cancer with Firmagon on 10/06/23. He reported some lower abdominal pain.  He continued on tamsulosin.  His lower urinary tract symptoms were stable.  No dysuria or gross hematuria. IPSS = 11.  Urine culture from 10/06/2023 grew >100 K E. coli.  He was treated with cefdinir x 7 days. He has seen Dr. Kathrynn Running with radiation oncology and has elected to proceed with external beam radiation for his prostate cancer. He returns today for continuation of his long-term ADT in preparation for external beam radiation. He has tolerated ADT well.  He does have occasional hot flashes. He continues on tamsulosin for his urinary symptoms.  He has frequency, urgency, and incomplete emptying. IPSS = 22. He will also be scheduled for placement of fiducial markers and SpaceOAR on 11/23/2023.  Portions of the above documentation were copied from a prior visit for review purposes only.   Past Medical History:  Past Medical History:  Diagnosis Date   Arthritis    Arthritis of right knee 04/25/2020   Carpal tunnel syndrome, bilateral    Chickenpox    Diabetes mellitus without complication (HCC)    Hyperlipemia    Hypertension    RBBB 04/17/2020   Noted on EKG    Past Surgical History:  Past Surgical History:  Procedure Laterality Date   BILATERAL CARPAL TUNNEL RELEASE     CERVICAL FUSION     COLONOSCOPY  2011   PROSTATE BIOPSY     TOTAL KNEE ARTHROPLASTY Right 04/26/2020   Procedure: RIGHT TOTAL KNEE ARTHROPLASTY;  Surgeon: Kathryne Hitch, MD;  Location: [facility];  Service: Orthopedics;  Laterality: Right;   TOTAL KNEE ARTHROPLASTY Left 09/25/2022   Procedure: LEFT TOTAL KNEE ARTHROPLASTY;  Surgeon: Kathryne Hitch, MD;  Location: [facility];  Service: Orthopedics;   Laterality: Left;    Allergies:  Allergies  Allergen Reactions   Cinnamon Flavor Other (See Comments)    Cinnamon chewing gum caused blisters on tongue    Family History:  History reviewed. No pertinent family history.  Social History:  Social History   Tobacco Use   Smoking status: Never   Smokeless tobacco: Never  Vaping Use   Vaping status: Never Used  Substance Use Topics   Alcohol use: No   Drug use: No    ROS: Constitutional:  Negative for fever, chills, weight loss CV: Negative for chest pain, previous MI, hypertension Respiratory:  Negative for shortness of breath, wheezing, sleep apnea, frequent cough GI:  Negative for nausea, vomiting, bloody stool, GERD  Physical exam: BP 133/65   Pulse 68   Ht 5\' 9"  (1.753 m)   Wt 189 lb (85.7 kg)   BMI 27.91 kg/m  GENERAL APPEARANCE:  Well appearing, well developed, well nourished, NAD HEENT:  Atraumatic, normocephalic, oropharynx clear NECK:  Supple without lymphadenopathy or thyromegaly ABDOMEN:  Soft, non-tender, no masses EXTREMITIES:  Moves all extremities well, without clubbing, cyanosis, or edema NEUROLOGIC:  Alert  and oriented x 3, normal gait, CN II-XII grossly intact MENTAL STATUS:  appropriate BACK:  Non-tender to palpation, No CVAT SKIN:  Warm, dry, and intact  Results: U/A: 0-5 WBC, 0-2 RBC, many bacteria, nitrite +

## 2023-11-05 NOTE — Progress Notes (Signed)
RN spoke with patient and provided education on next steps for his radiation treatment.  Patient had concerns with radiation starting in December, I reassured him and provided education on reasoning for the intentional 2 month start post ADT.  Verbalized understanding and thankfulness.  Patient had concerns for transportation when daily radiation begins, referral will be placed to our transportation coordinator.    Will continue to follow.

## 2023-11-07 LAB — URINE CULTURE

## 2023-11-08 ENCOUNTER — Other Ambulatory Visit: Payer: Self-pay | Admitting: Urology

## 2023-11-08 MED ORDER — NITROFURANTOIN MONOHYD MACRO 100 MG PO CAPS: 100.0000 mg | ORAL_CAPSULE | Freq: Every day | ORAL | 2 refills | Status: DC

## 2023-11-08 MED ORDER — CEFDINIR 300 MG PO CAPS: 300.0000 mg | ORAL_CAPSULE | Freq: Two times a day (BID) | ORAL | 0 refills | Status: AC

## 2023-11-08 NOTE — Addendum Note (Signed)
Addended by: Milderd Meager on: 11/08/2023 09:49 AM   Modules accepted: Orders

## 2023-11-10 ENCOUNTER — Encounter (HOSPITAL_BASED_OUTPATIENT_CLINIC_OR_DEPARTMENT_OTHER): Payer: Self-pay | Admitting: Urology

## 2023-11-10 NOTE — Progress Notes (Signed)
Spoke w/ via phone for pre-op interview--- pt Lab needs dos----  [employer], ekg       Lab results------ no COVID test -----patient states asymptomatic no test needed Arrive at ------- 1100 on 11-23-2023 NPO after MN NO Solid Food.  Clear liquids from MN until--- 1000 Med rec completed Medications to take morning of surgery ----- lopressor, flomax Diabetic medication ----- do not take metformin morning of surgery Patient instructed no nail polish to be worn day of surgery Patient instructed to bring photo id and insurance card day of surgery Patient aware to have Driver (ride ) friend, mr Office manager /  caregiver for 24 hours after surgery - neighbor will be staying w/ him, stated  will have name/ number at check in [facility] Patient Special Instructions -----  will do one fleet enema  Pre-Op special Instructions ----- pt stated was not given any instructions about ASA, pt asked if office could call him with instructions.  Told pt I would call them.   Called and left message for OR scheduler for Dr Pete Glatter, Malva Cogan, let her know pt needs to be called with instructions whether to stop taking ASA prior to surgery or continue taking  Patient verbalized understanding of instructions that were given at this phone interview. Patient denies chest pain, sob, fever, cough at the interview.

## 2023-11-22 NOTE — Progress Notes (Signed)
RN spoke with patient to assess any questions prior to upcoming fiducial marker and spaceOAR gel placement followed by CT Simulation. All questions answered, and request for transportation services follow up sent.   Will continue to follow.

## 2023-11-23 ENCOUNTER — Encounter (HOSPITAL_BASED_OUTPATIENT_CLINIC_OR_DEPARTMENT_OTHER)
Admission: RE | Disposition: A | Discharge status: Home / Self Care | Payer: Self-pay | Source: Home / Self Care | Attending: Urology

## 2023-11-23 ENCOUNTER — Ambulatory Visit (HOSPITAL_BASED_OUTPATIENT_CLINIC_OR_DEPARTMENT_OTHER)
Admission: RE | Admit: 2023-11-23 | Discharge: 2023-11-23 | Disposition: A | Discharge status: Home / Self Care | Payer: Medicare Other | Attending: Urology | Admitting: Urology

## 2023-11-23 ENCOUNTER — Encounter (HOSPITAL_BASED_OUTPATIENT_CLINIC_OR_DEPARTMENT_OTHER): Payer: Self-pay | Admitting: Urology

## 2023-11-23 ENCOUNTER — Ambulatory Visit (HOSPITAL_BASED_OUTPATIENT_CLINIC_OR_DEPARTMENT_OTHER): Payer: Medicare Other | Admitting: Anesthesiology

## 2023-11-23 ENCOUNTER — Ambulatory Visit (HOSPITAL_BASED_OUTPATIENT_CLINIC_OR_DEPARTMENT_OTHER): Payer: Self-pay | Admitting: Anesthesiology

## 2023-11-23 DIAGNOSIS — Z7984 Long term (current) use of oral hypoglycemic drugs: Secondary | ICD-10-CM | POA: Diagnosis not present

## 2023-11-23 DIAGNOSIS — R3915 Urgency of urination: Secondary | ICD-10-CM | POA: Insufficient documentation

## 2023-11-23 DIAGNOSIS — Z87440 Personal history of urinary (tract) infections: Secondary | ICD-10-CM | POA: Insufficient documentation

## 2023-11-23 DIAGNOSIS — R351 Nocturia: Secondary | ICD-10-CM | POA: Diagnosis not present

## 2023-11-23 DIAGNOSIS — E1151 Type 2 diabetes mellitus with diabetic peripheral angiopathy without gangrene: Secondary | ICD-10-CM | POA: Diagnosis not present

## 2023-11-23 DIAGNOSIS — N138 Other obstructive and reflux uropathy: Secondary | ICD-10-CM | POA: Insufficient documentation

## 2023-11-23 DIAGNOSIS — E785 Hyperlipidemia, unspecified: Secondary | ICD-10-CM | POA: Diagnosis not present

## 2023-11-23 DIAGNOSIS — I4510 Unspecified right bundle-branch block: Secondary | ICD-10-CM | POA: Diagnosis not present

## 2023-11-23 DIAGNOSIS — N401 Enlarged prostate with lower urinary tract symptoms: Secondary | ICD-10-CM | POA: Diagnosis not present

## 2023-11-23 DIAGNOSIS — C61 Malignant neoplasm of prostate: Secondary | ICD-10-CM

## 2023-11-23 DIAGNOSIS — R350 Frequency of micturition: Secondary | ICD-10-CM | POA: Insufficient documentation

## 2023-11-23 DIAGNOSIS — I10 Essential (primary) hypertension: Secondary | ICD-10-CM | POA: Insufficient documentation

## 2023-11-23 DIAGNOSIS — M1711 Unilateral primary osteoarthritis, right knee: Secondary | ICD-10-CM | POA: Insufficient documentation

## 2023-11-23 DIAGNOSIS — M1990 Unspecified osteoarthritis, unspecified site: Secondary | ICD-10-CM | POA: Diagnosis not present

## 2023-11-23 DIAGNOSIS — E876 Hypokalemia: Secondary | ICD-10-CM | POA: Diagnosis not present

## 2023-11-23 DIAGNOSIS — Z01818 Encounter for other preprocedural examination: Secondary | ICD-10-CM

## 2023-11-23 HISTORY — DX: Other specified dorsopathies, cervical region: M53.82

## 2023-11-23 HISTORY — DX: Type 2 diabetes mellitus without complications: E11.9

## 2023-11-23 HISTORY — PX: SPACE OAR INSTILLATION: SHX6769

## 2023-11-23 HISTORY — DX: Unspecified osteoarthritis, unspecified site: M19.90

## 2023-11-23 HISTORY — DX: Other obstructive and reflux uropathy: N13.8

## 2023-11-23 HISTORY — DX: Other obstructive and reflux uropathy: N40.1

## 2023-11-23 HISTORY — DX: Presence of spectacles and contact lenses: Z97.3

## 2023-11-23 HISTORY — DX: Mixed hyperlipidemia: E78.2

## 2023-11-23 HISTORY — PX: GOLD SEED IMPLANT: SHX6343

## 2023-11-23 LAB — GLUCOSE, CAPILLARY: Glucose-Capillary: 99 mg/dL (ref 70–99)

## 2023-11-23 LAB — POCT I-STAT, CHEM 8
BUN: 19 mg/dL (ref 8–23)
Calcium, Ion: 1.25 mmol/L (ref 1.15–1.40)
Chloride: 98 mmol/L (ref 98–111)
Creatinine, Ser: 1 mg/dL (ref 0.61–1.24)
Glucose, Bld: 127 mg/dL — ABNORMAL HIGH (ref 70–99)
HCT: 36 % — ABNORMAL LOW (ref 39.0–52.0)
Hemoglobin: 12.2 g/dL — ABNORMAL LOW (ref 13.0–17.0)
Potassium: 3.4 mmol/L — ABNORMAL LOW (ref 3.5–5.1)
Sodium: 140 mmol/L (ref 135–145)
TCO2: 26 mmol/L (ref 22–32)

## 2023-11-23 SURGERY — INSERTION, GOLD SEEDS
Anesthesia: Monitor Anesthesia Care | Site: Prostate

## 2023-11-23 MED ORDER — SODIUM CHLORIDE 0.9 % IV SOLN
INTRAVENOUS | Status: AC
  Filled 2023-11-23: qty 100

## 2023-11-23 MED ORDER — PROPOFOL 500 MG/50ML IV EMUL
INTRAVENOUS | Status: DC | PRN
  Administered 2023-11-23: 180 ug/kg/min via INTRAVENOUS

## 2023-11-23 MED ORDER — SODIUM CHLORIDE (PF) 0.9 % IJ SOLN
INTRAMUSCULAR | Status: DC | PRN
  Administered 2023-11-23: 10 mL

## 2023-11-23 MED ORDER — SODIUM CHLORIDE 0.9 % IV SOLN
INTRAVENOUS | Status: DC
Start: 2023-11-23 — End: 2023-11-23

## 2023-11-23 MED ORDER — KETOROLAC TROMETHAMINE 30 MG/ML IJ SOLN
INTRAMUSCULAR | Status: AC
  Filled 2023-11-23: qty 2

## 2023-11-23 MED ORDER — PROPOFOL 1000 MG/100ML IV EMUL
INTRAVENOUS | Status: AC
  Filled 2023-11-23: qty 100

## 2023-11-23 MED ORDER — PHENYLEPHRINE 80 MCG/ML (10ML) SYRINGE FOR IV PUSH (FOR BLOOD PRESSURE SUPPORT)
PREFILLED_SYRINGE | INTRAVENOUS | Status: AC
  Filled 2023-11-23: qty 10

## 2023-11-23 MED ORDER — SODIUM CHLORIDE 0.9 % IV SOLN
1.0000 g | INTRAVENOUS | Status: AC
  Administered 2023-11-23: 1 g via INTRAVENOUS

## 2023-11-23 MED ORDER — LIDOCAINE 2% (20 MG/ML) 5 ML SYRINGE
INTRAMUSCULAR | Status: DC | PRN
  Administered 2023-11-23: 40 mg via INTRAVENOUS

## 2023-11-23 MED ORDER — FENTANYL CITRATE (PF) 100 MCG/2ML IJ SOLN
25.0000 ug | INTRAMUSCULAR | Status: DC | PRN
Start: 2023-11-23 — End: 2023-11-23

## 2023-11-23 MED ORDER — PROPOFOL 10 MG/ML IV BOLUS
INTRAVENOUS | Status: DC | PRN
  Administered 2023-11-23 (×2): 20 mg via INTRAVENOUS

## 2023-11-23 MED ORDER — ONDANSETRON HCL 4 MG/2ML IJ SOLN
INTRAMUSCULAR | Status: DC | PRN
  Administered 2023-11-23: 4 mg via INTRAVENOUS

## 2023-11-23 MED ORDER — CEFTRIAXONE SODIUM 1 G IJ SOLR
INTRAMUSCULAR | Status: AC
  Filled 2023-11-23: qty 10

## 2023-11-23 MED ORDER — LIDOCAINE HCL 2 % IJ SOLN
INTRAMUSCULAR | Status: DC | PRN
  Administered 2023-11-23: 10 mL

## 2023-11-23 MED ORDER — FENTANYL CITRATE (PF) 250 MCG/5ML IJ SOLN
INTRAMUSCULAR | Status: DC | PRN
  Administered 2023-11-23: 50 ug via INTRAVENOUS

## 2023-11-23 MED ORDER — PHENYLEPHRINE 80 MCG/ML (10ML) SYRINGE FOR IV PUSH (FOR BLOOD PRESSURE SUPPORT)
PREFILLED_SYRINGE | INTRAVENOUS | Status: DC | PRN
  Administered 2023-11-23 (×4): 80 ug via INTRAVENOUS

## 2023-11-23 MED ORDER — AMISULPRIDE (ANTIEMETIC) 5 MG/2ML IV SOLN: 10.0000 mg | Freq: Once | INTRAVENOUS | Status: DC | PRN

## 2023-11-23 MED ORDER — ONDANSETRON HCL 4 MG/2ML IJ SOLN
INTRAMUSCULAR | Status: AC
  Filled 2023-11-23: qty 2

## 2023-11-23 MED ORDER — OXYCODONE HCL 5 MG/5ML PO SOLN: 5.0000 mg | Freq: Once | ORAL | Status: DC | PRN

## 2023-11-23 MED ORDER — FENTANYL CITRATE (PF) 100 MCG/2ML IJ SOLN
INTRAMUSCULAR | Status: AC
  Filled 2023-11-23: qty 2

## 2023-11-23 MED ORDER — LIDOCAINE HCL (PF) 2 % IJ SOLN
INTRAMUSCULAR | Status: AC
  Filled 2023-11-23: qty 5

## 2023-11-23 MED ORDER — OXYCODONE HCL 5 MG PO TABS: 5.0000 mg | ORAL_TABLET | Freq: Once | ORAL | Status: DC | PRN

## 2023-11-23 MED ORDER — ACETAMINOPHEN 500 MG PO TABS
1000.0000 mg | ORAL_TABLET | Freq: Once | ORAL | Status: AC
  Administered 2023-11-23: 1000 mg via ORAL

## 2023-11-23 MED ORDER — ACETAMINOPHEN 500 MG PO TABS
ORAL_TABLET | ORAL | Status: AC
  Filled 2023-11-23: qty 2

## 2023-11-23 SURGICAL SUPPLY — 24 items
BLADE CLIPPER SENSICLIP SURGIC (BLADE) ×1 IMPLANT
BLANKET WARM UPPER BOD BAIR (MISCELLANEOUS) ×1 IMPLANT
CNTNR URN SCR LID CUP LEK RST (MISCELLANEOUS) ×1 IMPLANT
COVER BACK TABLE 60X90IN (DRAPES) ×1 IMPLANT
DRSG TEGADERM 4X4.75 (GAUZE/BANDAGES/DRESSINGS) ×1 IMPLANT
DRSG TEGADERM 8X12 (GAUZE/BANDAGES/DRESSINGS) ×1 IMPLANT
GAUZE SPONGE 4X4 12PLY STRL (GAUZE/BANDAGES/DRESSINGS) ×1 IMPLANT
GLOVE BIO SURGEON STRL SZ8 (GLOVE) ×1 IMPLANT
IMPL SPACEOAR VUE SYSTEM (Spacer) IMPLANT
IMPLANT SPACEOAR VUE SYSTEM (Spacer) ×1 IMPLANT
KIT TURNOVER CYSTO (KITS) ×1 IMPLANT
MARKER GOLD PRELOAD 1.2X3 (Urological Implant) ×1 IMPLANT
MARKER SKIN DUAL TIP RULER LAB (MISCELLANEOUS) ×1 IMPLANT
NDL SPNL 22GX3.5 QUINCKE BK (NEEDLE) ×1 IMPLANT
NEEDLE SPNL 22GX3.5 QUINCKE BK (NEEDLE) ×1 IMPLANT
SEED GOLD PRELOAD 1.2X3 (Urological Implant) ×1 IMPLANT
SHEATH ULTRASOUND LF (SHEATH) IMPLANT
SHEATH ULTRASOUND LTX NONSTRL (SHEATH) IMPLANT
SLEEVE SCD COMPRESS KNEE MED (STOCKING) ×1 IMPLANT
SURGILUBE 2OZ TUBE FLIPTOP (MISCELLANEOUS) ×1 IMPLANT
SYR 10ML LL (SYRINGE) IMPLANT
SYR CONTROL 10ML LL (SYRINGE) ×1 IMPLANT
TOWEL OR 17X24 6PK STRL BLUE (TOWEL DISPOSABLE) ×1 IMPLANT
UNDERPAD 30X36 HEAVY ABSORB (UNDERPADS AND DIAPERS) ×1 IMPLANT

## 2023-11-23 NOTE — Anesthesia Preprocedure Evaluation (Addendum)
Anesthesia Evaluation  Patient identified by MRN, date of birth, ID band Patient awake    Reviewed: Allergy & Precautions, NPO status , Patient's Chart, lab work & pertinent test results  History of Anesthesia Complications Negative for: history of anesthetic complications  Airway Mallampati: III  TM Distance: >3 FB Neck ROM: Full    Dental  (+) Dental Advisory Given,    Pulmonary neg pulmonary ROS   Pulmonary exam normal breath sounds clear to auscultation       Cardiovascular hypertension (chlorthalidone, metoprolol), Pt. on home beta blockers and Pt. on medications (-) angina + Peripheral Vascular Disease  (-) Past MI, (-) Cardiac Stents and (-) CABG + dysrhythmias (RBBB)  Rhythm:Regular Rate:Normal  HLD   Neuro/Psych negative neurological ROS     GI/Hepatic negative GI ROS, Neg liver ROS,,,  Endo/Other  diabetes, Type 2, Oral Hypoglycemic Agents    Renal/GU negative Renal ROS   Prostate cancer    Musculoskeletal  (+) Arthritis , Osteoarthritis,    Abdominal   Peds  Hematology negative hematology ROS (+) Lab Results      Component                Value               Date                      WBC                      7.4                 02/22/2023                HGB                      15.6                02/22/2023                HCT                      45.6                02/22/2023                MCV                      83.7                02/22/2023                PLT                      314.0               02/22/2023              Anesthesia Other Findings   Reproductive/Obstetrics                             Anesthesia Physical Anesthesia Plan  ASA: 3  Anesthesia Plan: MAC   Post-op Pain Management: Tylenol PO (pre-op)*   Induction: Intravenous  PONV Risk Score and Plan: 2 and Ondansetron, Dexamethasone, Treatment may vary due to age or medical condition, Propofol  infusion and TIVA  Airway Management Planned: Natural Airway and Simple  Face Mask  Additional Equipment:   Intra-op Plan:   Post-operative Plan: Extubation in OR  Informed Consent: I have reviewed the patients History and Physical, chart, labs and discussed the procedure including the risks, benefits and alternatives for the proposed anesthesia with the patient or authorized representative who has indicated his/her understanding and acceptance.     Dental advisory given  Plan Discussed with: CRNA and Anesthesiologist  Anesthesia Plan Comments: (Discussed with patient risks of MAC including, but not limited to, minor pain or discomfort, hearing people in the room, and possible need for backup general anesthesia. Risks for general anesthesia also discussed including, but not limited to, sore throat, hoarse voice, chipped/damaged teeth, injury to vocal cords, nausea and vomiting, allergic reactions, lung infection, heart attack, stroke, and death. All questions answered. )        Anesthesia Quick Evaluation

## 2023-11-23 NOTE — Interval H&P Note (Signed)
History and Physical Interval Note:  11/23/2023 12:41 PM  Richard Harding  has presented today for surgery, with the diagnosis of prostate cancer.  The various methods of treatment have been discussed with the patient and family. After consideration of risks, benefits and other options for treatment, the patient has consented to  Procedure(s): GOLD SEED IMPLANT (N/A) SPACE OAR INSTILLATION (N/A) as a surgical intervention.  The patient's history has been reviewed, patient examined, no change in status, stable for surgery.  I have reviewed the patient's chart and labs.  Questions were answered to the patient's satisfaction.     [name]

## 2023-11-23 NOTE — Transfer of Care (Signed)
Immediate Anesthesia Transfer of Care Note  Patient: Richard Harding  Procedure(s) Performed: GOLD SEED IMPLANT (Prostate) SPACE OAR INSTILLATION (Prostate)  Patient Location: PACU  Anesthesia Type:MAC  Level of Consciousness: drowsy and patient cooperative  Airway & Oxygen Therapy: Patient Spontanous Breathing and Patient connected to face mask oxygen  Post-op Assessment: Report given to RN and Post -op Vital signs reviewed and stable  Post vital signs: Reviewed and stable  Last Vitals:  Vitals Value Taken Time  BP 126/64 11/23/23 1349  Temp    Pulse 71 11/23/23 1351  Resp 18 11/23/23 1351  SpO2 100 % 11/23/23 1351  Vitals shown include unfiled device data.  Last Pain:  Vitals:   11/23/23 1151  TempSrc: Oral  PainSc: 0-No pain      Patients Stated Pain Goal: 2 (11/23/23 1151)  Complications: No notable events documented.

## 2023-11-23 NOTE — Anesthesia Postprocedure Evaluation (Signed)
Anesthesia Post Note  Patient: Emrah Corriea Podoll  Procedure(s) Performed: GOLD SEED IMPLANT (Prostate) SPACE OAR INSTILLATION (Prostate)     Patient location during evaluation: PACU Anesthesia Type: MAC Level of consciousness: awake Pain management: pain level controlled Vital Signs Assessment: post-procedure vital signs reviewed and stable Respiratory status: spontaneous breathing, nonlabored ventilation and respiratory function stable Cardiovascular status: stable and blood pressure returned to baseline Postop Assessment: no apparent nausea or vomiting Anesthetic complications: no   No notable events documented.  Last Vitals:  Vitals:   11/23/23 1415 11/23/23 1430  BP: 139/69 138/69  Pulse: 72 72  Resp: 13 14  Temp:    SpO2: 92% 98%    Last Pain:  Vitals:   11/23/23 1415  TempSrc:   PainSc: 0-No pain                 Linton Rump

## 2023-11-23 NOTE — Discharge Instructions (Signed)
   No acetaminophen/Tylenol until after 6:00 pm today if needed.     Post Anesthesia Home Care Instructions  Activity: Get plenty of rest for the remainder of the day. A responsible individual must stay with you for 24 hours following the procedure.  For the next 24 hours, DO NOT: -Drive a car -Advertising copywriter -Drink alcoholic beverages -Take any medication unless instructed by your physician -Make any legal decisions or sign important papers.  Meals: Start with liquid foods such as gelatin or soup. Progress to regular foods as tolerated. Avoid greasy, spicy, heavy foods. If nausea and/or vomiting occur, drink only clear liquids until the nausea and/or vomiting subsides. Call your physician if vomiting continues.  Special Instructions/Symptoms: Your throat may feel dry or sore from the anesthesia or the breathing tube placed in your throat during surgery. If this causes discomfort, gargle with warm salt water. The discomfort should disappear within 24 hours.

## 2023-11-23 NOTE — Op Note (Signed)
Preoperative diagnosis: Prostate cancer   Postoperative diagnosis: Prostate cancer   Procedure:  1) Fiducial marker placement into the prostate 2) Insertion of SpaceOAR hydrogel    Surgeon: Milderd Meager, MD   Anesthesia: IV sedation, Local anesthesia   EBL: Minimal   Complications: None   Indication: The potential risks, complications, alternative options, and expected recovery course associated with the above procedure(s) have been discussed in detail with the patient and he has provided informed consent to proceed.   Description of procedure: The patient was administered preoperative antibiotics, placed in the dorsal lithotomy position, and prepped and draped in the usual sterile fashion. A preoperative time out was performed.  Next, transrectal ultrasonography was utilized to visualize the prostate. 10 cc of 0.25% bupivacaine was then used to infiltrate the subcuateous tissue of the perineum and an additional 10 cc was injected into the lateral apical tissue surrounding the prostate for a periprostatic nerve block.  Three gold fiducial markers were then placed into the prostate via transperineal needles under ultrasound guidance at the left apex, left base, and right mid gland under direct ultrasound guidance.  A site in the midline was then selected on the perineum for placement of an 18 g needle with saline.  The needle was advanced above the rectum and below Denonvillier's fascia to the mid gland and confirmed to be in the midline on transverse imaging.  One cc of saline was injected confirming appropriate expansion of this space.  A total of 5-10 cc of saline was then injected to open the space further bilaterally.  The saline syringe was then removed and the SpaceOAR hydrogel was injected with good distribution bilaterally. He tolerated the procedure well and without complications. He was able to be awakened and transferred to the PACU in stable condition.

## 2023-11-25 ENCOUNTER — Ambulatory Visit: Payer: Medicare Other | Admitting: Radiation Oncology

## 2023-11-25 ENCOUNTER — Telehealth: Payer: Self-pay | Admitting: *Deleted

## 2023-11-25 ENCOUNTER — Encounter (HOSPITAL_BASED_OUTPATIENT_CLINIC_OR_DEPARTMENT_OTHER): Payer: Self-pay | Admitting: Urology

## 2023-11-25 NOTE — Telephone Encounter (Signed)
CALLED PATIENT TO REMIND OF SIM APPT. FOR 11-26-23- ARRIVAL TIME- 9:45 AM @ [facility], INFORMED PATIENT TO ARRIVE WITH A FULL BLADDER, SPOKE WITH PATIENT AND HE IS AWARE OF THIS APPT. AND THE INSTRUCTIONS

## 2023-11-25 NOTE — Progress Notes (Signed)
  Radiation Oncology         [phone] ________________________________  Name: Richard Harding MRN: [id number]  Date: 11/26/2023  DOB: [birth year]  SIMULATION AND TREATMENT PLANNING NOTE    ICD-10-CM   1. 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   NARRATIVE:  The patient was brought to the CT Simulation planning suite.  Identity was confirmed.  All relevant records and images related to the planned course of therapy were reviewed.  The patient freely provided informed written consent to proceed with treatment after reviewing the details related to the planned course of therapy. The consent form was witnessed and verified by the simulation staff.  Then, the patient was set-up in a stable reproducible supine position for radiation therapy.  A vacuum lock pillow device was custom fabricated to position his legs in a reproducible immobilized position.  Then, I performed a urethrogram under sterile conditions to identify the prostatic apex.  CT images were obtained.  Surface markings were placed.  The CT images were loaded into the planning software.  Then the prostate target and avoidance structures including the rectum, bladder, bowel and hips were contoured.  Treatment planning then occurred.  The radiation prescription was entered and confirmed.  A total of one complex treatment devices was fabricated. I have requested : Intensity Modulated Radiotherapy (IMRT) is medically necessary for this case for the following reason:  Rectal sparing.Marland Kitchen  PLAN:   The prostate, seminal vesicles, and pelvic lymph nodes will initially be treated to 45 Gy in 25 fractions of 1.8 Gy followed by a boost to the prostate only, to 75 Gy with 15 additional fractions of 2.0 Gy   ________________________________  Artist Pais Kathrynn Running, M.D.

## 2023-11-26 ENCOUNTER — Inpatient Hospital Stay: Payer: Medicare Other | Attending: Radiation Oncology

## 2023-11-26 ENCOUNTER — Ambulatory Visit
Admission: RE | Admit: 2023-11-26 | Discharge: 2023-11-26 | Disposition: A | Discharge status: Home / Self Care | Payer: Medicare Other | Source: Ambulatory Visit | Attending: Radiation Oncology | Admitting: Radiation Oncology

## 2023-11-26 DIAGNOSIS — C61 Malignant neoplasm of prostate: Secondary | ICD-10-CM | POA: Diagnosis not present

## 2023-11-26 DIAGNOSIS — Z510 Encounter for antineoplastic radiation therapy: Secondary | ICD-10-CM | POA: Diagnosis not present

## 2023-11-26 DIAGNOSIS — Z191 Hormone sensitive malignancy status: Secondary | ICD-10-CM | POA: Diagnosis not present

## 2023-11-26 NOTE — Progress Notes (Signed)
RN received follow up from transportation coordinator that patient was contacted this AM for transportation assistance.

## 2023-11-29 DIAGNOSIS — Z510 Encounter for antineoplastic radiation therapy: Secondary | ICD-10-CM | POA: Diagnosis not present

## 2023-11-29 DIAGNOSIS — C61 Malignant neoplasm of prostate: Secondary | ICD-10-CM | POA: Diagnosis not present

## 2023-11-29 DIAGNOSIS — Z191 Hormone sensitive malignancy status: Secondary | ICD-10-CM | POA: Diagnosis not present

## 2023-12-06 ENCOUNTER — Other Ambulatory Visit: Payer: Self-pay | Admitting: Nurse Practitioner

## 2023-12-06 DIAGNOSIS — E785 Hyperlipidemia, unspecified: Secondary | ICD-10-CM

## 2023-12-07 ENCOUNTER — Other Ambulatory Visit: Payer: Self-pay

## 2023-12-07 ENCOUNTER — Ambulatory Visit
Admission: RE | Admit: 2023-12-07 | Discharge: 2023-12-07 | Disposition: A | Discharge status: Home / Self Care | Payer: Medicare Other | Source: Ambulatory Visit | Attending: Radiation Oncology | Admitting: Radiation Oncology

## 2023-12-07 DIAGNOSIS — C61 Malignant neoplasm of prostate: Secondary | ICD-10-CM | POA: Diagnosis not present

## 2023-12-07 DIAGNOSIS — Z191 Hormone sensitive malignancy status: Secondary | ICD-10-CM | POA: Diagnosis not present

## 2023-12-07 DIAGNOSIS — Z510 Encounter for antineoplastic radiation therapy: Secondary | ICD-10-CM | POA: Diagnosis not present

## 2023-12-07 LAB — RAD ONC ARIA SESSION SUMMARY
Course Elapsed Days: 0
Plan Fractions Treated to Date: 1
Plan Prescribed Dose Per Fraction: 1.8 Gy
Plan Total Fractions Prescribed: 25
Plan Total Prescribed Dose: 45 Gy
Reference Point Dosage Given to Date: 1.8 Gy
Reference Point Session Dosage Given: 1.8 Gy
Session Number: 1

## 2023-12-07 NOTE — Telephone Encounter (Signed)
Requesting: Omega-3-acid Ethyl Esters 1 GM Oral Capsule  Last Visit: 09/06/2023 Next Visit: Visit date not found Last Refill: 06/25/2023  Please Advise

## 2023-12-08 ENCOUNTER — Other Ambulatory Visit: Payer: Self-pay

## 2023-12-08 ENCOUNTER — Ambulatory Visit
Admission: RE | Admit: 2023-12-08 | Discharge: 2023-12-08 | Disposition: A | Discharge status: Home / Self Care | Payer: Medicare Other | Source: Ambulatory Visit | Attending: Radiation Oncology | Admitting: Radiation Oncology

## 2023-12-08 DIAGNOSIS — C61 Malignant neoplasm of prostate: Secondary | ICD-10-CM | POA: Diagnosis not present

## 2023-12-08 DIAGNOSIS — Z510 Encounter for antineoplastic radiation therapy: Secondary | ICD-10-CM | POA: Diagnosis not present

## 2023-12-08 DIAGNOSIS — Z191 Hormone sensitive malignancy status: Secondary | ICD-10-CM | POA: Diagnosis not present

## 2023-12-08 LAB — RAD ONC ARIA SESSION SUMMARY
Course Elapsed Days: 1
Plan Fractions Treated to Date: 2
Plan Prescribed Dose Per Fraction: 1.8 Gy
Plan Total Fractions Prescribed: 25
Plan Total Prescribed Dose: 45 Gy
Reference Point Dosage Given to Date: 3.6 Gy
Reference Point Session Dosage Given: 1.8 Gy
Session Number: 2

## 2023-12-09 ENCOUNTER — Ambulatory Visit
Admission: RE | Admit: 2023-12-09 | Discharge: 2023-12-09 | Disposition: A | Discharge status: Home / Self Care | Payer: Medicare Other | Source: Ambulatory Visit | Attending: Radiation Oncology | Admitting: Radiation Oncology

## 2023-12-09 ENCOUNTER — Other Ambulatory Visit: Payer: Self-pay

## 2023-12-09 ENCOUNTER — Inpatient Hospital Stay: Payer: Medicare Other

## 2023-12-09 DIAGNOSIS — Z510 Encounter for antineoplastic radiation therapy: Secondary | ICD-10-CM | POA: Diagnosis not present

## 2023-12-09 DIAGNOSIS — Z191 Hormone sensitive malignancy status: Secondary | ICD-10-CM | POA: Diagnosis not present

## 2023-12-09 DIAGNOSIS — C61 Malignant neoplasm of prostate: Secondary | ICD-10-CM | POA: Diagnosis not present

## 2023-12-09 LAB — RAD ONC ARIA SESSION SUMMARY
Course Elapsed Days: 2
Plan Fractions Treated to Date: 3
Plan Prescribed Dose Per Fraction: 1.8 Gy
Plan Total Fractions Prescribed: 25
Plan Total Prescribed Dose: 45 Gy
Reference Point Dosage Given to Date: 5.4 Gy
Reference Point Session Dosage Given: 1.8 Gy
Session Number: 3

## 2023-12-10 ENCOUNTER — Other Ambulatory Visit: Payer: Self-pay

## 2023-12-10 ENCOUNTER — Inpatient Hospital Stay: Payer: Medicare Other

## 2023-12-10 ENCOUNTER — Ambulatory Visit
Admission: RE | Admit: 2023-12-10 | Discharge: 2023-12-10 | Disposition: A | Discharge status: Home / Self Care | Payer: Medicare Other | Source: Ambulatory Visit | Attending: Radiation Oncology | Admitting: Radiation Oncology

## 2023-12-10 DIAGNOSIS — Z191 Hormone sensitive malignancy status: Secondary | ICD-10-CM | POA: Diagnosis not present

## 2023-12-10 DIAGNOSIS — C61 Malignant neoplasm of prostate: Secondary | ICD-10-CM

## 2023-12-10 DIAGNOSIS — Z510 Encounter for antineoplastic radiation therapy: Secondary | ICD-10-CM | POA: Diagnosis not present

## 2023-12-10 LAB — RAD ONC ARIA SESSION SUMMARY
Course Elapsed Days: 3
Plan Fractions Treated to Date: 4
Plan Prescribed Dose Per Fraction: 1.8 Gy
Plan Total Fractions Prescribed: 25
Plan Total Prescribed Dose: 45 Gy
Reference Point Dosage Given to Date: 7.2 Gy
Reference Point Session Dosage Given: 1.8 Gy
Session Number: 4

## 2023-12-13 ENCOUNTER — Other Ambulatory Visit: Payer: Self-pay

## 2023-12-13 ENCOUNTER — Ambulatory Visit
Admission: RE | Admit: 2023-12-13 | Discharge: 2023-12-13 | Disposition: A | Discharge status: Home / Self Care | Payer: Medicare Other | Source: Ambulatory Visit | Attending: Radiation Oncology

## 2023-12-13 DIAGNOSIS — C61 Malignant neoplasm of prostate: Secondary | ICD-10-CM | POA: Diagnosis not present

## 2023-12-13 DIAGNOSIS — Z510 Encounter for antineoplastic radiation therapy: Secondary | ICD-10-CM | POA: Diagnosis not present

## 2023-12-13 DIAGNOSIS — Z191 Hormone sensitive malignancy status: Secondary | ICD-10-CM | POA: Diagnosis not present

## 2023-12-13 LAB — RAD ONC ARIA SESSION SUMMARY
Course Elapsed Days: 6
Plan Fractions Treated to Date: 5
Plan Prescribed Dose Per Fraction: 1.8 Gy
Plan Total Fractions Prescribed: 25
Plan Total Prescribed Dose: 45 Gy
Reference Point Dosage Given to Date: 9 Gy
Reference Point Session Dosage Given: 1.8 Gy
Session Number: 5

## 2023-12-13 NOTE — Progress Notes (Signed)
[facility] CSW Progress Note  Clinical Social Worker  placed referral for patient  to Meals on Wheels through the [employer].    Marguerita Merles, LCSW Clinical Social Worker [facility]

## 2023-12-14 ENCOUNTER — Ambulatory Visit
Admission: RE | Admit: 2023-12-14 | Discharge: 2023-12-14 | Disposition: A | Discharge status: Home / Self Care | Payer: Medicare Other | Source: Ambulatory Visit | Attending: Radiation Oncology

## 2023-12-14 ENCOUNTER — Other Ambulatory Visit: Payer: Self-pay

## 2023-12-14 DIAGNOSIS — Z191 Hormone sensitive malignancy status: Secondary | ICD-10-CM | POA: Diagnosis not present

## 2023-12-14 DIAGNOSIS — Z510 Encounter for antineoplastic radiation therapy: Secondary | ICD-10-CM | POA: Diagnosis not present

## 2023-12-14 DIAGNOSIS — C61 Malignant neoplasm of prostate: Secondary | ICD-10-CM | POA: Diagnosis not present

## 2023-12-14 LAB — RAD ONC ARIA SESSION SUMMARY
Course Elapsed Days: 7
Plan Fractions Treated to Date: 6
Plan Prescribed Dose Per Fraction: 1.8 Gy
Plan Total Fractions Prescribed: 25
Plan Total Prescribed Dose: 45 Gy
Reference Point Dosage Given to Date: 10.8 Gy
Reference Point Session Dosage Given: 1.8 Gy
Session Number: 6

## 2023-12-16 ENCOUNTER — Other Ambulatory Visit: Payer: Self-pay

## 2023-12-16 ENCOUNTER — Ambulatory Visit
Admission: RE | Admit: 2023-12-16 | Discharge: 2023-12-16 | Disposition: A | Discharge status: Home / Self Care | Payer: Medicare Other | Source: Ambulatory Visit | Attending: Radiation Oncology | Admitting: Radiation Oncology

## 2023-12-16 DIAGNOSIS — C61 Malignant neoplasm of prostate: Secondary | ICD-10-CM | POA: Diagnosis not present

## 2023-12-16 DIAGNOSIS — Z191 Hormone sensitive malignancy status: Secondary | ICD-10-CM | POA: Diagnosis not present

## 2023-12-16 DIAGNOSIS — Z510 Encounter for antineoplastic radiation therapy: Secondary | ICD-10-CM | POA: Diagnosis not present

## 2023-12-16 LAB — RAD ONC ARIA SESSION SUMMARY
Course Elapsed Days: 9
Plan Fractions Treated to Date: 7
Plan Prescribed Dose Per Fraction: 1.8 Gy
Plan Total Fractions Prescribed: 25
Plan Total Prescribed Dose: 45 Gy
Reference Point Dosage Given to Date: 12.6 Gy
Reference Point Session Dosage Given: 1.8 Gy
Session Number: 7

## 2023-12-17 ENCOUNTER — Ambulatory Visit
Admission: RE | Admit: 2023-12-17 | Discharge: 2023-12-17 | Disposition: A | Discharge status: Home / Self Care | Payer: Medicare Other | Source: Ambulatory Visit | Attending: Radiation Oncology

## 2023-12-17 ENCOUNTER — Ambulatory Visit
Admission: RE | Admit: 2023-12-17 | Discharge: 2023-12-17 | Disposition: A | Discharge status: Home / Self Care | Payer: Medicare Other | Source: Ambulatory Visit | Attending: Radiation Oncology | Admitting: Radiation Oncology

## 2023-12-17 ENCOUNTER — Other Ambulatory Visit: Payer: Self-pay

## 2023-12-17 DIAGNOSIS — Z191 Hormone sensitive malignancy status: Secondary | ICD-10-CM | POA: Diagnosis not present

## 2023-12-17 DIAGNOSIS — Z510 Encounter for antineoplastic radiation therapy: Secondary | ICD-10-CM | POA: Diagnosis not present

## 2023-12-17 DIAGNOSIS — C61 Malignant neoplasm of prostate: Secondary | ICD-10-CM | POA: Diagnosis not present

## 2023-12-17 LAB — RAD ONC ARIA SESSION SUMMARY
Course Elapsed Days: 10
Plan Fractions Treated to Date: 8
Plan Prescribed Dose Per Fraction: 1.8 Gy
Plan Total Fractions Prescribed: 25
Plan Total Prescribed Dose: 45 Gy
Reference Point Dosage Given to Date: 14.4 Gy
Reference Point Session Dosage Given: 1.8 Gy
Session Number: 8

## 2023-12-20 ENCOUNTER — Other Ambulatory Visit: Payer: Self-pay

## 2023-12-20 ENCOUNTER — Inpatient Hospital Stay: Payer: Medicare Other

## 2023-12-20 ENCOUNTER — Ambulatory Visit
Admission: RE | Admit: 2023-12-20 | Discharge: 2023-12-20 | Disposition: A | Discharge status: Home / Self Care | Payer: Medicare Other | Source: Ambulatory Visit | Attending: Radiation Oncology

## 2023-12-20 DIAGNOSIS — C61 Malignant neoplasm of prostate: Secondary | ICD-10-CM | POA: Diagnosis not present

## 2023-12-20 DIAGNOSIS — Z191 Hormone sensitive malignancy status: Secondary | ICD-10-CM | POA: Diagnosis not present

## 2023-12-20 DIAGNOSIS — Z510 Encounter for antineoplastic radiation therapy: Secondary | ICD-10-CM | POA: Diagnosis not present

## 2023-12-20 LAB — RAD ONC ARIA SESSION SUMMARY
Course Elapsed Days: 13
Plan Fractions Treated to Date: 9
Plan Prescribed Dose Per Fraction: 1.8 Gy
Plan Total Fractions Prescribed: 25
Plan Total Prescribed Dose: 45 Gy
Reference Point Dosage Given to Date: 16.2 Gy
Reference Point Session Dosage Given: 1.8 Gy
Session Number: 9

## 2023-12-20 NOTE — Progress Notes (Signed)
[facility] CSW Progress Note  Clinical Social Worker  called patient at his request   to discuss Meals on Wheels and [employer]. CSW informed patient MOW wait list and eligibility requirements, patient is unable to be placed on wait list due to not being home bound. Patient understands how he can apply if eligibility changes. CSW discussed home aid options with patient's insurance, patient verbalized understanding.Patient's insurance does not support   No follow up at this time  Marguerita Merles, LCSW Clinical Social Worker [facility]

## 2023-12-21 ENCOUNTER — Other Ambulatory Visit: Payer: Self-pay

## 2023-12-21 ENCOUNTER — Ambulatory Visit
Admission: RE | Admit: 2023-12-21 | Discharge: 2023-12-21 | Disposition: A | Discharge status: Home / Self Care | Payer: Medicare Other | Source: Ambulatory Visit | Attending: Radiation Oncology | Admitting: Radiation Oncology

## 2023-12-21 DIAGNOSIS — Z510 Encounter for antineoplastic radiation therapy: Secondary | ICD-10-CM | POA: Diagnosis not present

## 2023-12-21 DIAGNOSIS — Z191 Hormone sensitive malignancy status: Secondary | ICD-10-CM | POA: Diagnosis not present

## 2023-12-21 DIAGNOSIS — C61 Malignant neoplasm of prostate: Secondary | ICD-10-CM | POA: Diagnosis not present

## 2023-12-21 LAB — RAD ONC ARIA SESSION SUMMARY
Course Elapsed Days: 14
Plan Fractions Treated to Date: 10
Plan Prescribed Dose Per Fraction: 1.8 Gy
Plan Total Fractions Prescribed: 25
Plan Total Prescribed Dose: 45 Gy
Reference Point Dosage Given to Date: 18 Gy
Reference Point Session Dosage Given: 1.8 Gy
Session Number: 10

## 2023-12-23 ENCOUNTER — Other Ambulatory Visit: Payer: Self-pay

## 2023-12-23 ENCOUNTER — Ambulatory Visit
Admission: RE | Admit: 2023-12-23 | Discharge: 2023-12-23 | Disposition: A | Discharge status: Home / Self Care | Payer: Medicare Other | Source: Ambulatory Visit | Attending: Radiation Oncology | Admitting: Radiation Oncology

## 2023-12-23 DIAGNOSIS — C61 Malignant neoplasm of prostate: Secondary | ICD-10-CM | POA: Insufficient documentation

## 2023-12-23 DIAGNOSIS — Z191 Hormone sensitive malignancy status: Secondary | ICD-10-CM | POA: Diagnosis not present

## 2023-12-23 DIAGNOSIS — Z510 Encounter for antineoplastic radiation therapy: Secondary | ICD-10-CM | POA: Diagnosis not present

## 2023-12-23 LAB — RAD ONC ARIA SESSION SUMMARY
Course Elapsed Days: 16
Plan Fractions Treated to Date: 11
Plan Prescribed Dose Per Fraction: 1.8 Gy
Plan Total Fractions Prescribed: 25
Plan Total Prescribed Dose: 45 Gy
Reference Point Dosage Given to Date: 19.8 Gy
Reference Point Session Dosage Given: 1.8 Gy
Session Number: 11

## 2023-12-24 ENCOUNTER — Ambulatory Visit
Admission: RE | Admit: 2023-12-24 | Discharge: 2023-12-24 | Disposition: A | Discharge status: Home / Self Care | Payer: Medicare Other | Source: Ambulatory Visit | Attending: Radiation Oncology

## 2023-12-24 ENCOUNTER — Ambulatory Visit
Admission: RE | Admit: 2023-12-24 | Discharge: 2023-12-24 | Disposition: A | Discharge status: Home / Self Care | Payer: Medicare Other | Source: Ambulatory Visit | Attending: Radiation Oncology | Admitting: Radiation Oncology

## 2023-12-24 ENCOUNTER — Other Ambulatory Visit: Payer: Self-pay | Admitting: Urology

## 2023-12-24 ENCOUNTER — Other Ambulatory Visit: Payer: Self-pay

## 2023-12-24 DIAGNOSIS — Z191 Hormone sensitive malignancy status: Secondary | ICD-10-CM | POA: Diagnosis not present

## 2023-12-24 DIAGNOSIS — C61 Malignant neoplasm of prostate: Secondary | ICD-10-CM | POA: Diagnosis not present

## 2023-12-24 DIAGNOSIS — Z510 Encounter for antineoplastic radiation therapy: Secondary | ICD-10-CM | POA: Diagnosis not present

## 2023-12-24 LAB — RAD ONC ARIA SESSION SUMMARY
Course Elapsed Days: 17
Plan Fractions Treated to Date: 12
Plan Prescribed Dose Per Fraction: 1.8 Gy
Plan Total Fractions Prescribed: 25
Plan Total Prescribed Dose: 45 Gy
Reference Point Dosage Given to Date: 21.6 Gy
Reference Point Session Dosage Given: 1.8 Gy
Session Number: 12

## 2023-12-24 MED ORDER — SOLIFENACIN SUCCINATE 5 MG PO TABS: 5.0000 mg | ORAL_TABLET | Freq: Every day | ORAL | 1 refills | Status: DC

## 2023-12-26 ENCOUNTER — Other Ambulatory Visit: Payer: Self-pay | Admitting: Nurse Practitioner

## 2023-12-26 DIAGNOSIS — E1151 Type 2 diabetes mellitus with diabetic peripheral angiopathy without gangrene: Secondary | ICD-10-CM

## 2023-12-27 ENCOUNTER — Other Ambulatory Visit: Payer: Self-pay

## 2023-12-27 ENCOUNTER — Ambulatory Visit
Admission: RE | Admit: 2023-12-27 | Discharge: 2023-12-27 | Disposition: A | Discharge status: Home / Self Care | Payer: Medicare Other | Source: Ambulatory Visit | Attending: Radiation Oncology | Admitting: Radiation Oncology

## 2023-12-27 DIAGNOSIS — Z510 Encounter for antineoplastic radiation therapy: Secondary | ICD-10-CM | POA: Diagnosis not present

## 2023-12-27 DIAGNOSIS — C61 Malignant neoplasm of prostate: Secondary | ICD-10-CM | POA: Diagnosis not present

## 2023-12-27 DIAGNOSIS — Z191 Hormone sensitive malignancy status: Secondary | ICD-10-CM | POA: Diagnosis not present

## 2023-12-27 LAB — RAD ONC ARIA SESSION SUMMARY
Course Elapsed Days: 20
Plan Fractions Treated to Date: 13
Plan Prescribed Dose Per Fraction: 1.8 Gy
Plan Total Fractions Prescribed: 25
Plan Total Prescribed Dose: 45 Gy
Reference Point Dosage Given to Date: 23.4 Gy
Reference Point Session Dosage Given: 1.8 Gy
Session Number: 13

## 2023-12-28 ENCOUNTER — Other Ambulatory Visit: Payer: Self-pay

## 2023-12-28 ENCOUNTER — Ambulatory Visit
Admission: RE | Admit: 2023-12-28 | Discharge: 2023-12-28 | Disposition: A | Discharge status: Home / Self Care | Payer: Medicare Other | Source: Ambulatory Visit | Attending: Radiation Oncology

## 2023-12-28 DIAGNOSIS — C61 Malignant neoplasm of prostate: Secondary | ICD-10-CM | POA: Diagnosis not present

## 2023-12-28 DIAGNOSIS — Z191 Hormone sensitive malignancy status: Secondary | ICD-10-CM | POA: Diagnosis not present

## 2023-12-28 DIAGNOSIS — Z510 Encounter for antineoplastic radiation therapy: Secondary | ICD-10-CM | POA: Diagnosis not present

## 2023-12-28 LAB — RAD ONC ARIA SESSION SUMMARY
Course Elapsed Days: 21
Plan Fractions Treated to Date: 14
Plan Prescribed Dose Per Fraction: 1.8 Gy
Plan Total Fractions Prescribed: 25
Plan Total Prescribed Dose: 45 Gy
Reference Point Dosage Given to Date: 25.2 Gy
Reference Point Session Dosage Given: 1.8 Gy
Session Number: 14

## 2023-12-28 NOTE — Telephone Encounter (Signed)
 Requesting: Metformin Last Visit: 09/06/2023 Next Visit: Visit date not found Last Refill: 09/30/2023  Please Advise

## 2023-12-29 ENCOUNTER — Inpatient Hospital Stay: Payer: Medicare Other

## 2023-12-29 ENCOUNTER — Inpatient Hospital Stay: Payer: Medicare Other | Attending: Radiation Oncology

## 2023-12-29 ENCOUNTER — Ambulatory Visit
Admission: RE | Admit: 2023-12-29 | Discharge: 2023-12-29 | Disposition: A | Discharge status: Home / Self Care | Payer: Medicare Other | Source: Ambulatory Visit | Attending: Radiation Oncology | Admitting: Radiation Oncology

## 2023-12-29 ENCOUNTER — Other Ambulatory Visit: Payer: Self-pay

## 2023-12-29 DIAGNOSIS — Z510 Encounter for antineoplastic radiation therapy: Secondary | ICD-10-CM | POA: Diagnosis not present

## 2023-12-29 DIAGNOSIS — C61 Malignant neoplasm of prostate: Secondary | ICD-10-CM | POA: Diagnosis not present

## 2023-12-29 DIAGNOSIS — Z191 Hormone sensitive malignancy status: Secondary | ICD-10-CM | POA: Diagnosis not present

## 2023-12-29 LAB — RAD ONC ARIA SESSION SUMMARY
Course Elapsed Days: 22
Plan Fractions Treated to Date: 15
Plan Prescribed Dose Per Fraction: 1.8 Gy
Plan Total Fractions Prescribed: 25
Plan Total Prescribed Dose: 45 Gy
Reference Point Dosage Given to Date: 27 Gy
Reference Point Session Dosage Given: 1.8 Gy
Session Number: 15

## 2023-12-29 NOTE — Progress Notes (Signed)
 [facility] CSW Progress Note  Visual merchandiser met with patient as planned. CSW provided emotional and social support as patient discussed progress of treatment.    Marguerita Merles, LCSW Clinical Social Worker [facility]

## 2023-12-30 ENCOUNTER — Inpatient Hospital Stay: Payer: Medicare Other

## 2023-12-30 ENCOUNTER — Ambulatory Visit: Payer: Medicare Other

## 2023-12-30 DIAGNOSIS — C61 Malignant neoplasm of prostate: Secondary | ICD-10-CM | POA: Diagnosis not present

## 2023-12-30 DIAGNOSIS — Z191 Hormone sensitive malignancy status: Secondary | ICD-10-CM | POA: Diagnosis not present

## 2023-12-30 DIAGNOSIS — Z510 Encounter for antineoplastic radiation therapy: Secondary | ICD-10-CM | POA: Diagnosis not present

## 2023-12-31 ENCOUNTER — Ambulatory Visit
Admission: RE | Admit: 2023-12-31 | Discharge: 2023-12-31 | Disposition: A | Discharge status: Home / Self Care | Payer: Medicare Other | Source: Ambulatory Visit | Attending: Radiation Oncology

## 2023-12-31 ENCOUNTER — Inpatient Hospital Stay: Payer: Medicare Other

## 2023-12-31 ENCOUNTER — Other Ambulatory Visit: Payer: Self-pay

## 2023-12-31 ENCOUNTER — Encounter: Payer: Self-pay | Admitting: *Deleted

## 2023-12-31 DIAGNOSIS — Z191 Hormone sensitive malignancy status: Secondary | ICD-10-CM | POA: Diagnosis not present

## 2023-12-31 DIAGNOSIS — C61 Malignant neoplasm of prostate: Secondary | ICD-10-CM | POA: Diagnosis not present

## 2023-12-31 DIAGNOSIS — Z510 Encounter for antineoplastic radiation therapy: Secondary | ICD-10-CM | POA: Diagnosis not present

## 2023-12-31 LAB — RAD ONC ARIA SESSION SUMMARY
Course Elapsed Days: 24
Plan Fractions Treated to Date: 16
Plan Prescribed Dose Per Fraction: 1.8 Gy
Plan Total Fractions Prescribed: 25
Plan Total Prescribed Dose: 45 Gy
Reference Point Dosage Given to Date: 28.8 Gy
Reference Point Session Dosage Given: 1.8 Gy
Session Number: 16

## 2024-01-03 ENCOUNTER — Other Ambulatory Visit: Payer: Self-pay

## 2024-01-03 ENCOUNTER — Inpatient Hospital Stay: Payer: Medicare Other

## 2024-01-03 ENCOUNTER — Ambulatory Visit: Payer: Medicare Other

## 2024-01-03 ENCOUNTER — Ambulatory Visit
Admission: RE | Admit: 2024-01-03 | Discharge: 2024-01-03 | Disposition: A | Discharge status: Home / Self Care | Payer: Medicare Other | Source: Ambulatory Visit | Attending: Radiation Oncology | Admitting: Radiation Oncology

## 2024-01-03 DIAGNOSIS — Z510 Encounter for antineoplastic radiation therapy: Secondary | ICD-10-CM | POA: Diagnosis not present

## 2024-01-03 DIAGNOSIS — Z191 Hormone sensitive malignancy status: Secondary | ICD-10-CM | POA: Diagnosis not present

## 2024-01-03 DIAGNOSIS — C61 Malignant neoplasm of prostate: Secondary | ICD-10-CM | POA: Diagnosis not present

## 2024-01-03 LAB — RAD ONC ARIA SESSION SUMMARY
Course Elapsed Days: 27
Plan Fractions Treated to Date: 17
Plan Prescribed Dose Per Fraction: 1.8 Gy
Plan Total Fractions Prescribed: 25
Plan Total Prescribed Dose: 45 Gy
Reference Point Dosage Given to Date: 30.6 Gy
Reference Point Session Dosage Given: 1.8 Gy
Session Number: 17

## 2024-01-04 ENCOUNTER — Other Ambulatory Visit: Payer: Self-pay

## 2024-01-04 ENCOUNTER — Ambulatory Visit
Admission: RE | Admit: 2024-01-04 | Discharge: 2024-01-04 | Disposition: A | Discharge status: Home / Self Care | Payer: Medicare Other | Source: Ambulatory Visit | Attending: Radiation Oncology | Admitting: Radiation Oncology

## 2024-01-04 ENCOUNTER — Inpatient Hospital Stay: Payer: Medicare Other

## 2024-01-04 DIAGNOSIS — C61 Malignant neoplasm of prostate: Secondary | ICD-10-CM | POA: Diagnosis not present

## 2024-01-04 DIAGNOSIS — Z510 Encounter for antineoplastic radiation therapy: Secondary | ICD-10-CM | POA: Diagnosis not present

## 2024-01-04 DIAGNOSIS — Z191 Hormone sensitive malignancy status: Secondary | ICD-10-CM | POA: Diagnosis not present

## 2024-01-04 LAB — RAD ONC ARIA SESSION SUMMARY
Course Elapsed Days: 28
Plan Fractions Treated to Date: 18
Plan Prescribed Dose Per Fraction: 1.8 Gy
Plan Total Fractions Prescribed: 25
Plan Total Prescribed Dose: 45 Gy
Reference Point Dosage Given to Date: 32.4 Gy
Reference Point Session Dosage Given: 1.223 Gy
Session Number: 18

## 2024-01-05 ENCOUNTER — Other Ambulatory Visit: Payer: Self-pay

## 2024-01-05 ENCOUNTER — Ambulatory Visit
Admission: RE | Admit: 2024-01-05 | Discharge: 2024-01-05 | Disposition: A | Discharge status: Home / Self Care | Payer: Medicare Other | Source: Ambulatory Visit | Attending: Radiation Oncology

## 2024-01-05 ENCOUNTER — Inpatient Hospital Stay: Payer: Medicare Other

## 2024-01-05 DIAGNOSIS — Z191 Hormone sensitive malignancy status: Secondary | ICD-10-CM | POA: Diagnosis not present

## 2024-01-05 DIAGNOSIS — Z510 Encounter for antineoplastic radiation therapy: Secondary | ICD-10-CM | POA: Diagnosis not present

## 2024-01-05 DIAGNOSIS — C61 Malignant neoplasm of prostate: Secondary | ICD-10-CM | POA: Diagnosis not present

## 2024-01-05 LAB — RAD ONC ARIA SESSION SUMMARY
Course Elapsed Days: 29
Plan Fractions Treated to Date: 19
Plan Prescribed Dose Per Fraction: 1.8 Gy
Plan Total Fractions Prescribed: 25
Plan Total Prescribed Dose: 45 Gy
Reference Point Dosage Given to Date: 34.2 Gy
Reference Point Session Dosage Given: 1.8 Gy
Session Number: 19

## 2024-01-06 ENCOUNTER — Ambulatory Visit
Admission: RE | Admit: 2024-01-06 | Discharge: 2024-01-06 | Disposition: A | Discharge status: Home / Self Care | Payer: Medicare Other | Source: Ambulatory Visit | Attending: Radiation Oncology | Admitting: Radiation Oncology

## 2024-01-06 ENCOUNTER — Encounter: Payer: Self-pay | Admitting: Urology

## 2024-01-06 ENCOUNTER — Inpatient Hospital Stay: Payer: Medicare Other

## 2024-01-06 ENCOUNTER — Other Ambulatory Visit: Payer: Self-pay

## 2024-01-06 ENCOUNTER — Ambulatory Visit: Payer: Medicare Other | Admitting: Urology

## 2024-01-06 VITALS — BP 142/72 | HR 68 | Ht 69.0 in | Wt 190.0 lb

## 2024-01-06 DIAGNOSIS — Z191 Hormone sensitive malignancy status: Secondary | ICD-10-CM | POA: Diagnosis not present

## 2024-01-06 DIAGNOSIS — Z87440 Personal history of urinary (tract) infections: Secondary | ICD-10-CM

## 2024-01-06 DIAGNOSIS — N401 Enlarged prostate with lower urinary tract symptoms: Secondary | ICD-10-CM | POA: Diagnosis not present

## 2024-01-06 DIAGNOSIS — Z79818 Long term (current) use of other agents affecting estrogen receptors and estrogen levels: Secondary | ICD-10-CM | POA: Diagnosis not present

## 2024-01-06 DIAGNOSIS — Z510 Encounter for antineoplastic radiation therapy: Secondary | ICD-10-CM | POA: Diagnosis not present

## 2024-01-06 DIAGNOSIS — C61 Malignant neoplasm of prostate: Secondary | ICD-10-CM

## 2024-01-06 DIAGNOSIS — N138 Other obstructive and reflux uropathy: Secondary | ICD-10-CM

## 2024-01-06 LAB — RAD ONC ARIA SESSION SUMMARY
Course Elapsed Days: 30
Plan Fractions Treated to Date: 20
Plan Prescribed Dose Per Fraction: 1.8 Gy
Plan Total Fractions Prescribed: 25
Plan Total Prescribed Dose: 45 Gy
Reference Point Dosage Given to Date: 36 Gy
Reference Point Session Dosage Given: 1.8 Gy
Session Number: 20

## 2024-01-06 IMAGING — MR MR BREAST BILAT WO/W CM
8 of 12 series · 33 of 48 positions shown · IV contrast (9 ml gadavist)
Comparison: Diagnostic mammography, 04/04/2021

CLINICAL DATA: Patient presented on 04/04/2021 with right nipple
tenderness, and intermittent soreness in the lateral aspects of both
breasts. Diagnostic mammography was negative other than minimal
changes of gynecomastia on the right. Current exam is for further
assessment.

EXAM:
BILATERAL BREAST MRI WITH AND WITHOUT CONTRAST
TECHNIQUE: Multiplanar, multisequence MR images of both breasts were obtained
prior to and following the intravenous administration of 9 ml of
Gadavist

[Series 2: t2_tirm_tra ipat (a-p) · axial · 3.0mm · 0.74mm/px · 1 of 59 slices shown]
[im 1/59]
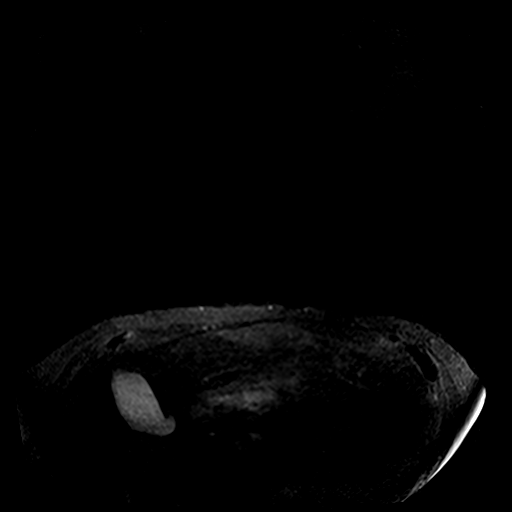

[Series 3: fl3d pre-cm no · axial · non-contrast · 1.2mm · 0.99mm/px · z∈[-124,+67]mm · 5 of 160 slices shown]
[im 1/160]
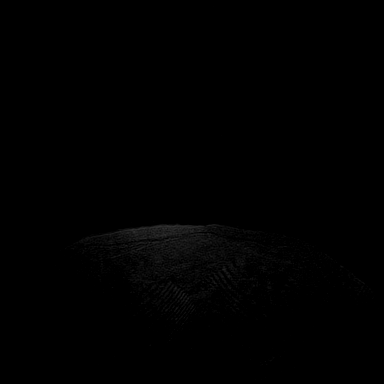
[im 40/160]
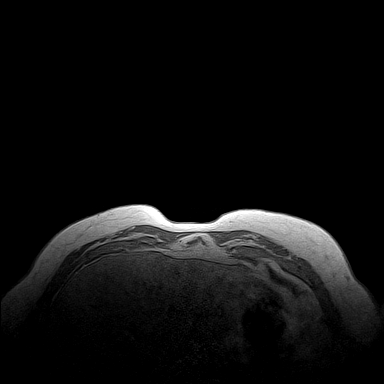
[im 80/160]
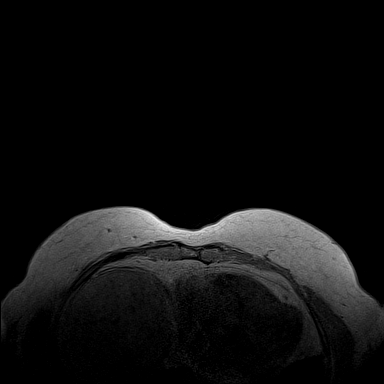
[im 120/160]
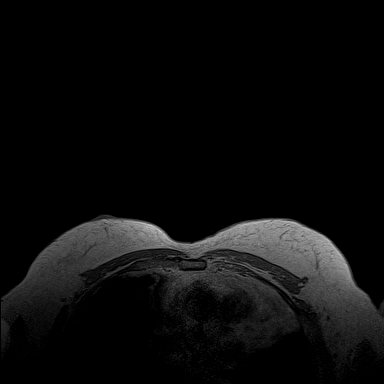
[im 160/160]
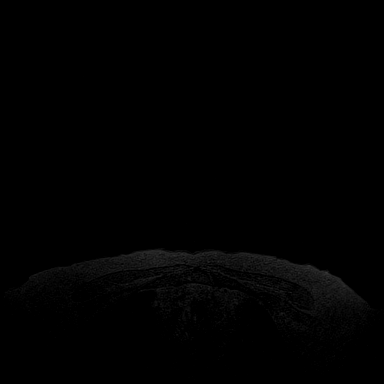

[Series 4: fl3d pre-cm · axial · non-contrast · 1.2mm · 0.99mm/px · z∈[-124,+67]mm · 5 of 160 slices shown]
[im 1/160]
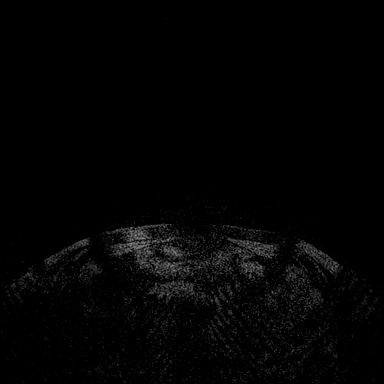
[im 40/160]
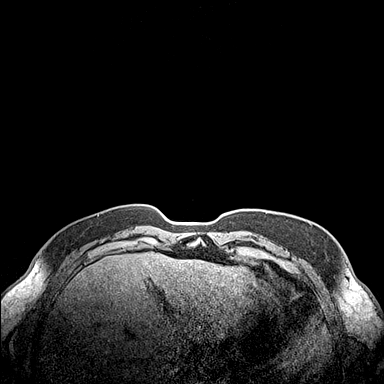
[im 80/160]
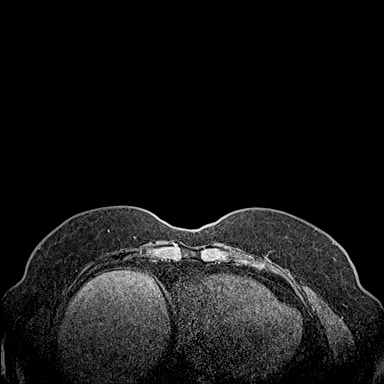
[im 120/160]
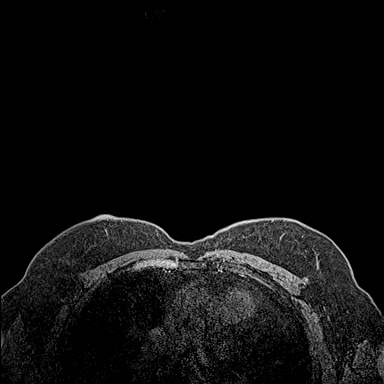
[im 160/160]
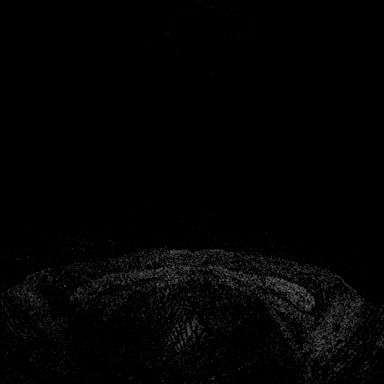

[Series 5: fl3d post-cm 20 · axial · 1.2mm · 0.99mm/px · z∈[-124,+67]mm · 5 of 160 slices shown (1 of 3)]
[im 1/160]
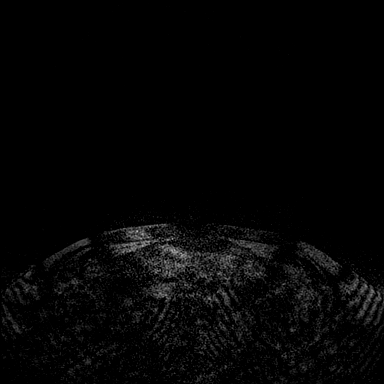
[im 40/160]
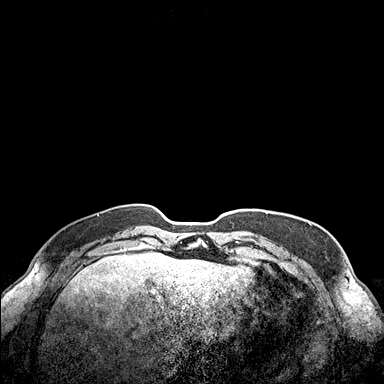
[im 80/160]
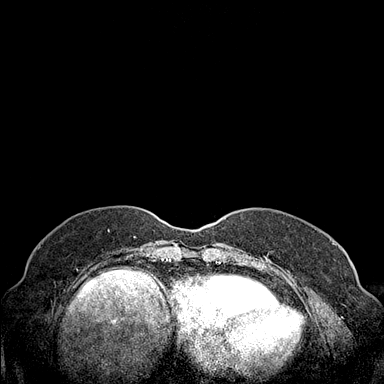
[im 120/160]
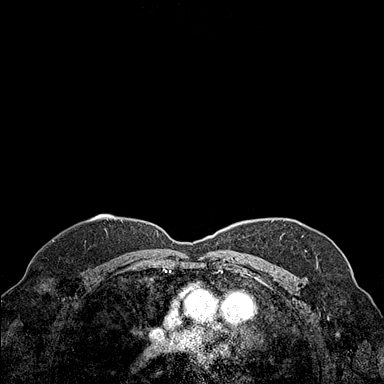
[im 160/160]
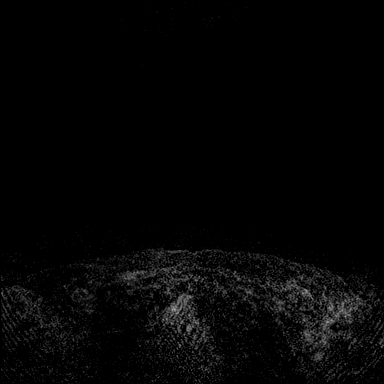

[Series 6: fl3d post-cm 20 · axial · 1.2mm · 0.99mm/px · z∈[-124,+67]mm · 5 of 160 slices shown (2 of 3)]
[im 1/160]
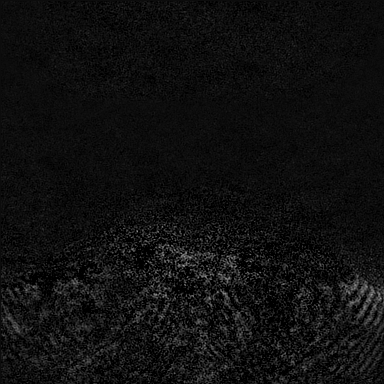
[im 40/160]
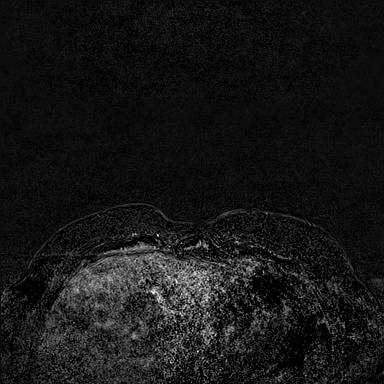
[im 80/160]
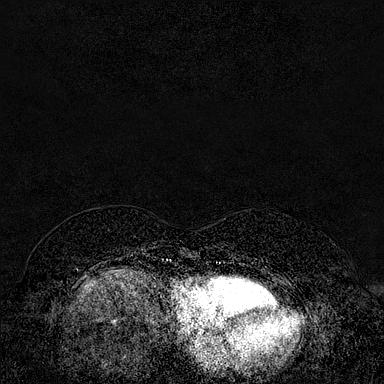
[im 120/160]
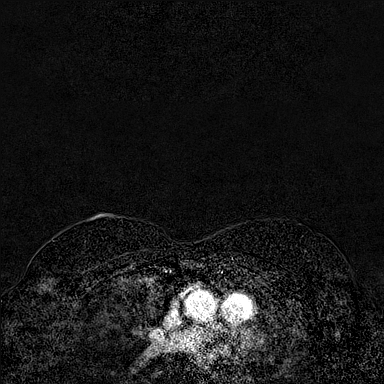
[im 160/160]
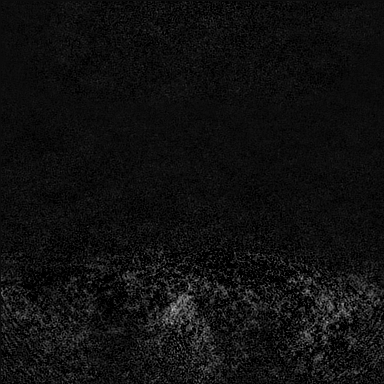

[Series 7: fl3d post-cm 20 · axial · 192.0mm · 0.99mm/px · 1 of 1 slices shown (3 of 3)]
[im 1/1]
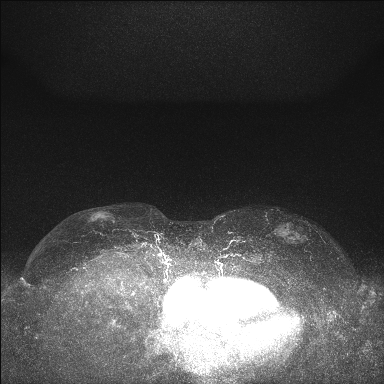

[Series 8: fl3d post-cm 3 · axial · 1.2mm · 0.99mm/px · z∈[-124,+67]mm · 6 of 160 slices shown (1 of 2)]
[im 1/160]
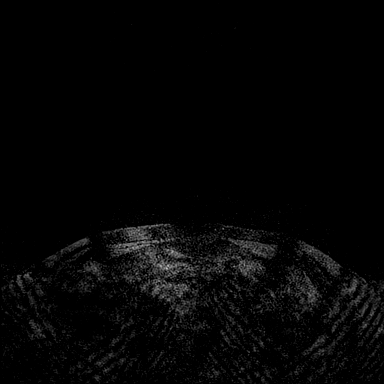
[im 32/160]
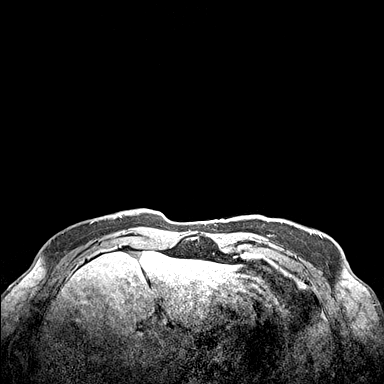
[im 64/160]
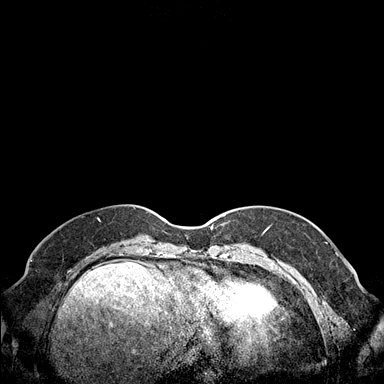
[im 96/160]
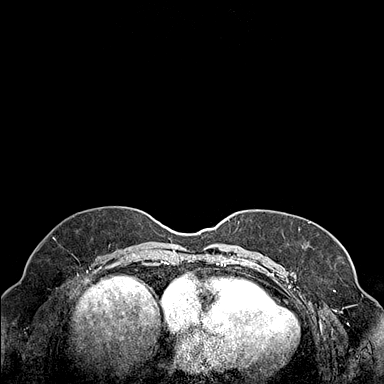
[im 128/160]
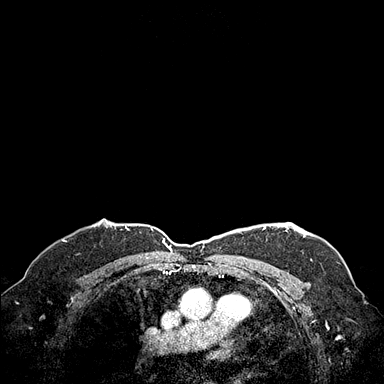
[im 160/160]
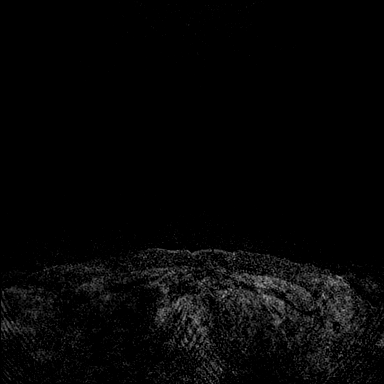

[Series 9: fl3d post-cm 3 · axial · 1.2mm · 0.99mm/px · z∈[-124,+29]mm · 5 of 160 slices shown (2 of 2)]
[im 1/160]
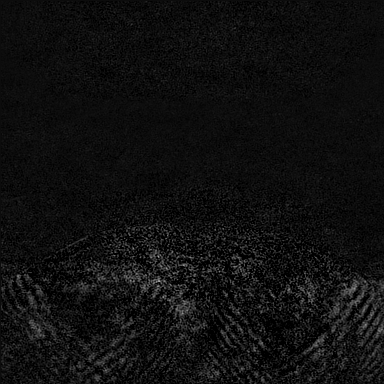
[im 32/160]
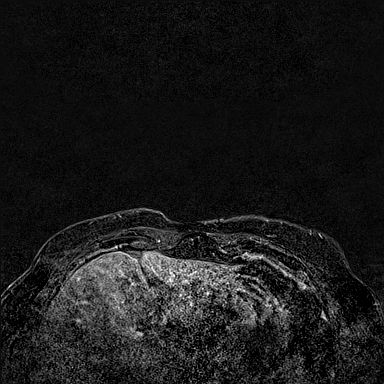
[im 64/160]
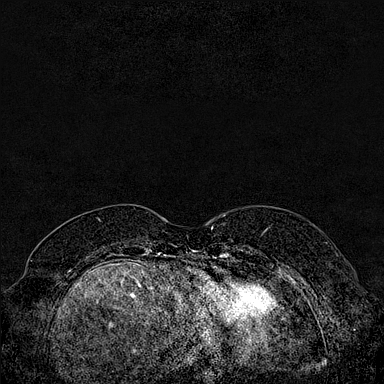
[im 96/160]
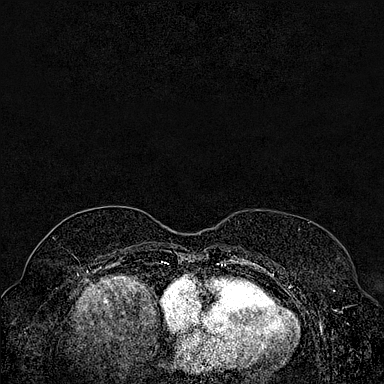
[im 128/160]
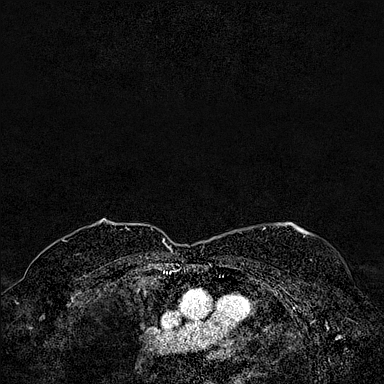

[33 of 48 positions shown; findings below may reference images not displayed]

Three-dimensional MR images were rendered by post-processing of the
original MR data on an independent workstation. The
three-dimensional MR images were interpreted, and findings are
reported in the following complete MRI report for this study. Three
dimensional images were evaluated at the independent interpreting
workstation using the DynaCAD thin client.
FINDINGS: Breast composition: a. Almost entirely fat.

Background parenchymal enhancement: Minimal

Right breast: No mass or abnormal enhancement.

Left breast: No mass or abnormal enhancement.

Lymph nodes: No abnormal appearing lymph nodes.

Ancillary findings:  None.
IMPRESSION: Normal exam.  No evidence of breast malignancy.

RECOMMENDATION:
Clinical follow-up for the patient's breast symptoms.

BI-RADS CATEGORY  1: Negative.

## 2024-01-06 NOTE — Addendum Note (Signed)
Addended by: Lizbeth Bark on: 01/06/2024 02:00 PM   Modules accepted: Orders

## 2024-01-06 NOTE — Progress Notes (Signed)
Assessment: 1. 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 Androgen deprivation therapy   3. BPH with obstruction/lower urinary tract symptoms   4. History of UTI     Plan: Continue tamsulosin 0.4 mg daily for BPH Continue LT ADT with 6 month Eligard - next dose due 05/02/24 Continue daily calcium and vit D supplement while on ADT to reduce risk of osteoporosis Recommend taking Solifenacin at night to see if this improves his nocturia. Return to office in 4 months  Chief Complaint:  Chief Complaint  Patient presents with   Prostate Cancer    History of Present Illness:  Richard Harding is a 78 y.o. male who is seen for further evaluation of elevated PSA, BPH with obstruction, and recent UTI. PSA results: 9/08 3.52 10/13 8.08 1/14 6.54 3/17 9.05 4/17 9.15 3/24 28.45  He has previously been evaluated for an elevated PSA by Dr. Wanda Plump in 2008 and again by Dr. Mena Goes in November 2013. No prior prostate biopsy.  No family history of prostate cancer. He reported lower urinary tract symptoms including frequency, occasional urgency, and nocturia x 2.  He was not having any incontinence.  No dysuria or gross hematuria. IPSS = 16. PVR = 181 ml.  He also reports a history of erectile dysfunction and decreased libido.  He has previously used Viagra and Cialis with adequate results.  He is inquiring about testosterone replacement therapy. Testosterone level from 7/23 was 271.  Urine culture from 3/24 grew >100 K staph epidermidis.  He was treated with Bactrim for 7 days. He was also started on tamsulosin 0.4 mg daily for his BPH with obstructive symptoms.  At his visit in April 2024, he had completed his antibiotics.  He continued on tamsulosin.  He reported improvement in his urinary symptoms with the tamsulosin with decreased frequency, urgency, and nocturia.  He felt like he was emptying his bladder more completely.  No dysuria or gross  hematuria. IPSS = 10.  His PSA in June 2024 had increased to 30.7.  He underwent evaluation with a prostate MRI which showed a PI-RADS 5 lesion in the left anterior transition zone and a PI-RADS 3 lesion in the left posterior lateral peripheral zone.  Prostate volume measured 88.56 cm.  He underwent a fusion biopsy on 09/02/2023.  Pathology showed Gleason 4+5 = 9 in 40% of 1 of 2 cores from the left region of interest.  Repeat PSA prior to his biopsy had increased to 97.2. PSMA PET scan obtained on 09/27/2023 showed intense radiotracer activity in the left lobe of the prostate and no evidence of metastatic disease. He was started on androgen deprivation therapy for management of his very high risk prostate cancer with Firmagon on 10/06/23. He reported some lower abdominal pain.  He continued on tamsulosin.  His lower urinary tract symptoms were stable.  No dysuria or gross hematuria. IPSS = 11.  Urine culture from 10/06/2023 grew >100 K E. coli.  He was treated with cefdinir x 7 days. He has seen Dr. Kathrynn Running with radiation oncology and has elected to proceed with external beam radiation for his prostate cancer. He received a 6 month Eligard injection on 11/14/23 for continuation of his long-term ADT. He has tolerated ADT well.  He does have occasional hot flashes. He continued on tamsulosin for his urinary symptoms.  He continued with symptoms of frequency, urgency, and incomplete emptying. IPSS = 22. Urine culture from 11/24: 10-25K E.  coli.  Treated with cefdinir x 7 days and started on daily Macrodantin.  He underwent placement of fiducial markers and SpaceOAR on 11/23/2023. He is currently undergoing IMRT for treatment of his prostate cancer.  He has about 21 treatments left. He is having some increased lower urinary tract symptoms associated with radiation therapy.  He reports increased frequency, urgency, and nocturia.  He was recently started on Solifenacin.  No dysuria or gross hematuria.   He continues on tamsulosin and daily Macrodantin.   Portions of the above documentation were copied from a prior visit for review purposes only.   Past Medical History:  Past Medical History:  Diagnosis Date   BPH with obstruction/lower urinary tract symptoms    Hyperlipidemia, mixed    Hypertension    Limited active range of motion (AROM) of cervical spine on rotation    Malignant neoplasm prostate [facility]) 08/2023   urologist-- dr Teniqua Marron/  radiation oncologist-- dr Kathrynn Running;  dx 09/ 2024, gleason 4+5, psa 97.2,  vol 88.5   OA (osteoarthritis)    RBBB 04/17/2020   Noted on EKG   Type 2 diabetes mellitus (HCC)    followed by pcp   (11-10-2023  pt stated does not check blood sugar)   Wears glasses     Past Surgical History:  Past Surgical History:  Procedure Laterality Date   ANTERIOR FUSION CERVICAL SPINE     1990s   BILATERAL CARPAL TUNNEL RELEASE Bilateral    1990s   COLONOSCOPY  2011   GOLD SEED IMPLANT N/A 11/23/2023   Procedure: GOLD SEED IMPLANT;  Surgeon: Milderd Meager., MD;  Location: [facility];  Service: Urology;  Laterality: N/A;   SPACE OAR INSTILLATION N/A 11/23/2023   Procedure: SPACE OAR INSTILLATION;  Surgeon: Milderd Meager., MD;  Location[facility];  Service: Urology;  Laterality: N/A;   TOTAL KNEE ARTHROPLASTY Right 04/26/2020   Procedure: RIGHT TOTAL KNEE ARTHROPLASTY;  Surgeon: Kathryne Hitch, MD;  Location: [facility];  Service: Orthopedics;  Laterality: Right;   TOTAL KNEE ARTHROPLASTY Left 09/25/2022   Procedure: LEFT TOTAL KNEE ARTHROPLASTY;  Surgeon: Kathryne Hitch, MD;  Location: [facility];  Service: Orthopedics;  Laterality: Left;    Allergies:  Allergies  Allergen Reactions   Cinnamon Flavoring Agent (Non-Screening) Other (See Comments)    Cinnamon chewing gum caused blisters on tongue    Family History:  No family history on file.  Social History:  Social History   Tobacco Use    Smoking status: Never   Smokeless tobacco: Never  Vaping Use   Vaping status: Never Used  Substance Use Topics   Alcohol use: No   Drug use: Never    ROS: Constitutional:  Negative for fever, chills, weight loss CV: Negative for chest pain, previous MI, hypertension Respiratory:  Negative for shortness of breath, wheezing, sleep apnea, frequent cough GI:  Negative for nausea, vomiting, bloody stool, GERD  Physical exam: BP (!) 142/72   Pulse 68   Ht 5\' 9"  (1.753 m)   Wt 190 lb (86.2 kg)   BMI 28.06 kg/m  GENERAL APPEARANCE:  Well appearing, well developed, well nourished, NAD HEENT:  Atraumatic, normocephalic, oropharynx clear NECK:  Supple without lymphadenopathy or thyromegaly ABDOMEN:  Soft, non-tender, no masses EXTREMITIES:  Moves all extremities well, without clubbing, cyanosis, or edema NEUROLOGIC:  Alert and oriented x 3, normal gait, CN II-XII grossly intact MENTAL STATUS:  appropriate BACK:  Non-tender to palpation, No CVAT SKIN:  Warm, dry, and intact  Results: None

## 2024-01-07 ENCOUNTER — Inpatient Hospital Stay: Payer: Medicare Other

## 2024-01-07 ENCOUNTER — Other Ambulatory Visit: Payer: Self-pay

## 2024-01-07 ENCOUNTER — Ambulatory Visit
Admission: RE | Admit: 2024-01-07 | Discharge: 2024-01-07 | Disposition: A | Discharge status: Home / Self Care | Payer: Medicare Other | Source: Ambulatory Visit | Attending: Radiation Oncology

## 2024-01-07 ENCOUNTER — Ambulatory Visit
Admission: RE | Admit: 2024-01-07 | Discharge: 2024-01-07 | Disposition: A | Discharge status: Home / Self Care | Payer: Medicare Other | Source: Ambulatory Visit | Attending: Radiation Oncology | Admitting: Radiation Oncology

## 2024-01-07 ENCOUNTER — Encounter: Payer: Self-pay | Admitting: *Deleted

## 2024-01-07 DIAGNOSIS — Z510 Encounter for antineoplastic radiation therapy: Secondary | ICD-10-CM | POA: Diagnosis not present

## 2024-01-07 DIAGNOSIS — Z191 Hormone sensitive malignancy status: Secondary | ICD-10-CM | POA: Diagnosis not present

## 2024-01-07 DIAGNOSIS — C61 Malignant neoplasm of prostate: Secondary | ICD-10-CM | POA: Diagnosis not present

## 2024-01-07 LAB — RAD ONC ARIA SESSION SUMMARY
Course Elapsed Days: 31
Plan Fractions Treated to Date: 21
Plan Prescribed Dose Per Fraction: 1.8 Gy
Plan Total Fractions Prescribed: 25
Plan Total Prescribed Dose: 45 Gy
Reference Point Dosage Given to Date: 37.8 Gy
Reference Point Session Dosage Given: 1.8 Gy
Session Number: 21

## 2024-01-10 ENCOUNTER — Inpatient Hospital Stay: Payer: Medicare Other

## 2024-01-10 ENCOUNTER — Other Ambulatory Visit: Payer: Self-pay

## 2024-01-10 ENCOUNTER — Ambulatory Visit
Admission: RE | Admit: 2024-01-10 | Discharge: 2024-01-10 | Disposition: A | Discharge status: Home / Self Care | Payer: Medicare Other | Source: Ambulatory Visit | Attending: Radiation Oncology

## 2024-01-10 DIAGNOSIS — Z191 Hormone sensitive malignancy status: Secondary | ICD-10-CM | POA: Diagnosis not present

## 2024-01-10 DIAGNOSIS — C61 Malignant neoplasm of prostate: Secondary | ICD-10-CM | POA: Diagnosis not present

## 2024-01-10 DIAGNOSIS — Z510 Encounter for antineoplastic radiation therapy: Secondary | ICD-10-CM | POA: Diagnosis not present

## 2024-01-10 LAB — RAD ONC ARIA SESSION SUMMARY
Course Elapsed Days: 34
Plan Fractions Treated to Date: 22
Plan Prescribed Dose Per Fraction: 1.8 Gy
Plan Total Fractions Prescribed: 25
Plan Total Prescribed Dose: 45 Gy
Reference Point Dosage Given to Date: 39.6 Gy
Reference Point Session Dosage Given: 1.8 Gy
Session Number: 22

## 2024-01-11 ENCOUNTER — Inpatient Hospital Stay: Payer: Medicare Other

## 2024-01-11 ENCOUNTER — Other Ambulatory Visit: Payer: Self-pay

## 2024-01-11 ENCOUNTER — Ambulatory Visit
Admission: RE | Admit: 2024-01-11 | Discharge: 2024-01-11 | Disposition: A | Discharge status: Home / Self Care | Payer: Medicare Other | Source: Ambulatory Visit | Attending: Radiation Oncology

## 2024-01-11 DIAGNOSIS — Z510 Encounter for antineoplastic radiation therapy: Secondary | ICD-10-CM | POA: Diagnosis not present

## 2024-01-11 DIAGNOSIS — Z191 Hormone sensitive malignancy status: Secondary | ICD-10-CM | POA: Diagnosis not present

## 2024-01-11 DIAGNOSIS — C61 Malignant neoplasm of prostate: Secondary | ICD-10-CM | POA: Diagnosis not present

## 2024-01-11 LAB — RAD ONC ARIA SESSION SUMMARY
Course Elapsed Days: 35
Plan Fractions Treated to Date: 23
Plan Prescribed Dose Per Fraction: 1.8 Gy
Plan Total Fractions Prescribed: 25
Plan Total Prescribed Dose: 45 Gy
Reference Point Dosage Given to Date: 41.4 Gy
Reference Point Session Dosage Given: 1.8 Gy
Session Number: 23

## 2024-01-12 ENCOUNTER — Other Ambulatory Visit: Payer: Self-pay

## 2024-01-12 ENCOUNTER — Inpatient Hospital Stay: Payer: Medicare Other

## 2024-01-12 ENCOUNTER — Ambulatory Visit
Admission: RE | Admit: 2024-01-12 | Discharge: 2024-01-12 | Disposition: A | Discharge status: Home / Self Care | Payer: Medicare Other | Source: Ambulatory Visit | Attending: Radiation Oncology | Admitting: Radiation Oncology

## 2024-01-12 DIAGNOSIS — Z510 Encounter for antineoplastic radiation therapy: Secondary | ICD-10-CM | POA: Diagnosis not present

## 2024-01-12 DIAGNOSIS — Z191 Hormone sensitive malignancy status: Secondary | ICD-10-CM | POA: Diagnosis not present

## 2024-01-12 DIAGNOSIS — C61 Malignant neoplasm of prostate: Secondary | ICD-10-CM | POA: Diagnosis not present

## 2024-01-12 LAB — RAD ONC ARIA SESSION SUMMARY
Course Elapsed Days: 36
Plan Fractions Treated to Date: 24
Plan Prescribed Dose Per Fraction: 1.8 Gy
Plan Total Fractions Prescribed: 25
Plan Total Prescribed Dose: 45 Gy
Reference Point Dosage Given to Date: 43.2 Gy
Reference Point Session Dosage Given: 1.8 Gy
Session Number: 24

## 2024-01-13 ENCOUNTER — Inpatient Hospital Stay: Payer: Medicare Other

## 2024-01-13 ENCOUNTER — Other Ambulatory Visit: Payer: Self-pay

## 2024-01-13 ENCOUNTER — Ambulatory Visit
Admission: RE | Admit: 2024-01-13 | Discharge: 2024-01-13 | Disposition: A | Discharge status: Home / Self Care | Payer: Medicare Other | Source: Ambulatory Visit | Attending: Radiation Oncology | Admitting: Radiation Oncology

## 2024-01-13 ENCOUNTER — Ambulatory Visit: Payer: Medicare Other

## 2024-01-13 DIAGNOSIS — Z191 Hormone sensitive malignancy status: Secondary | ICD-10-CM | POA: Diagnosis not present

## 2024-01-13 DIAGNOSIS — Z510 Encounter for antineoplastic radiation therapy: Secondary | ICD-10-CM | POA: Diagnosis not present

## 2024-01-13 DIAGNOSIS — C61 Malignant neoplasm of prostate: Secondary | ICD-10-CM | POA: Diagnosis not present

## 2024-01-13 LAB — RAD ONC ARIA SESSION SUMMARY
Course Elapsed Days: 37
Plan Fractions Treated to Date: 25
Plan Prescribed Dose Per Fraction: 1.8 Gy
Plan Total Fractions Prescribed: 25
Plan Total Prescribed Dose: 45 Gy
Reference Point Dosage Given to Date: 45 Gy
Reference Point Session Dosage Given: 1.8 Gy
Session Number: 25

## 2024-01-14 ENCOUNTER — Ambulatory Visit: Payer: Medicare Other

## 2024-01-14 ENCOUNTER — Inpatient Hospital Stay: Payer: Medicare Other

## 2024-01-16 ENCOUNTER — Other Ambulatory Visit: Payer: Self-pay | Admitting: Nurse Practitioner

## 2024-01-16 DIAGNOSIS — E785 Hyperlipidemia, unspecified: Secondary | ICD-10-CM

## 2024-01-17 ENCOUNTER — Inpatient Hospital Stay: Payer: Medicare Other

## 2024-01-17 ENCOUNTER — Ambulatory Visit: Payer: Medicare Other

## 2024-01-17 ENCOUNTER — Other Ambulatory Visit: Payer: Self-pay

## 2024-01-17 ENCOUNTER — Ambulatory Visit
Admission: RE | Admit: 2024-01-17 | Discharge: 2024-01-17 | Disposition: A | Discharge status: Home / Self Care | Payer: Medicare Other | Source: Ambulatory Visit | Attending: Radiation Oncology | Admitting: Radiation Oncology

## 2024-01-17 DIAGNOSIS — Z191 Hormone sensitive malignancy status: Secondary | ICD-10-CM | POA: Diagnosis not present

## 2024-01-17 DIAGNOSIS — C61 Malignant neoplasm of prostate: Secondary | ICD-10-CM | POA: Diagnosis not present

## 2024-01-17 DIAGNOSIS — Z510 Encounter for antineoplastic radiation therapy: Secondary | ICD-10-CM | POA: Diagnosis not present

## 2024-01-17 LAB — RAD ONC ARIA SESSION SUMMARY
Course Elapsed Days: 41
Plan Fractions Treated to Date: 1
Plan Prescribed Dose Per Fraction: 2 Gy
Plan Total Fractions Prescribed: 15
Plan Total Prescribed Dose: 30 Gy
Reference Point Dosage Given to Date: 2 Gy
Reference Point Session Dosage Given: 2 Gy
Session Number: 26

## 2024-01-18 ENCOUNTER — Inpatient Hospital Stay: Payer: Medicare Other

## 2024-01-18 ENCOUNTER — Ambulatory Visit
Admission: RE | Admit: 2024-01-18 | Discharge: 2024-01-18 | Disposition: A | Discharge status: Home / Self Care | Payer: Medicare Other | Source: Ambulatory Visit | Attending: Radiation Oncology | Admitting: Radiation Oncology

## 2024-01-18 ENCOUNTER — Other Ambulatory Visit: Payer: Self-pay

## 2024-01-18 DIAGNOSIS — Z510 Encounter for antineoplastic radiation therapy: Secondary | ICD-10-CM | POA: Diagnosis not present

## 2024-01-18 DIAGNOSIS — C61 Malignant neoplasm of prostate: Secondary | ICD-10-CM | POA: Diagnosis not present

## 2024-01-18 DIAGNOSIS — Z191 Hormone sensitive malignancy status: Secondary | ICD-10-CM | POA: Diagnosis not present

## 2024-01-18 LAB — RAD ONC ARIA SESSION SUMMARY
Course Elapsed Days: 42
Plan Fractions Treated to Date: 2
Plan Prescribed Dose Per Fraction: 2 Gy
Plan Total Fractions Prescribed: 15
Plan Total Prescribed Dose: 30 Gy
Reference Point Dosage Given to Date: 4 Gy
Reference Point Session Dosage Given: 2 Gy
Session Number: 27

## 2024-01-19 ENCOUNTER — Inpatient Hospital Stay: Payer: Medicare Other

## 2024-01-19 ENCOUNTER — Ambulatory Visit
Admission: RE | Admit: 2024-01-19 | Discharge: 2024-01-19 | Disposition: A | Discharge status: Home / Self Care | Payer: Medicare Other | Source: Ambulatory Visit | Attending: Radiation Oncology | Admitting: Radiation Oncology

## 2024-01-19 ENCOUNTER — Other Ambulatory Visit: Payer: Self-pay

## 2024-01-19 DIAGNOSIS — C61 Malignant neoplasm of prostate: Secondary | ICD-10-CM | POA: Diagnosis not present

## 2024-01-19 DIAGNOSIS — Z510 Encounter for antineoplastic radiation therapy: Secondary | ICD-10-CM | POA: Diagnosis not present

## 2024-01-19 DIAGNOSIS — Z191 Hormone sensitive malignancy status: Secondary | ICD-10-CM | POA: Diagnosis not present

## 2024-01-19 LAB — RAD ONC ARIA SESSION SUMMARY
Course Elapsed Days: 43
Plan Fractions Treated to Date: 3
Plan Prescribed Dose Per Fraction: 2 Gy
Plan Total Fractions Prescribed: 15
Plan Total Prescribed Dose: 30 Gy
Reference Point Dosage Given to Date: 6 Gy
Reference Point Session Dosage Given: 2 Gy
Session Number: 28

## 2024-01-20 ENCOUNTER — Other Ambulatory Visit: Payer: Self-pay

## 2024-01-20 ENCOUNTER — Ambulatory Visit
Admission: RE | Admit: 2024-01-20 | Discharge: 2024-01-20 | Disposition: A | Discharge status: Home / Self Care | Payer: Medicare Other | Source: Ambulatory Visit | Attending: Radiation Oncology | Admitting: Radiation Oncology

## 2024-01-20 DIAGNOSIS — Z510 Encounter for antineoplastic radiation therapy: Secondary | ICD-10-CM | POA: Diagnosis not present

## 2024-01-20 DIAGNOSIS — C61 Malignant neoplasm of prostate: Secondary | ICD-10-CM | POA: Diagnosis not present

## 2024-01-20 DIAGNOSIS — Z191 Hormone sensitive malignancy status: Secondary | ICD-10-CM | POA: Diagnosis not present

## 2024-01-20 LAB — RAD ONC ARIA SESSION SUMMARY
Course Elapsed Days: 44
Plan Fractions Treated to Date: 4
Plan Prescribed Dose Per Fraction: 2 Gy
Plan Total Fractions Prescribed: 15
Plan Total Prescribed Dose: 30 Gy
Reference Point Dosage Given to Date: 8 Gy
Reference Point Session Dosage Given: 2 Gy
Session Number: 29

## 2024-01-21 ENCOUNTER — Encounter: Payer: Self-pay | Admitting: *Deleted

## 2024-01-21 ENCOUNTER — Ambulatory Visit
Admission: RE | Admit: 2024-01-21 | Discharge: 2024-01-21 | Disposition: A | Discharge status: Home / Self Care | Payer: Medicare Other | Source: Ambulatory Visit | Attending: Radiation Oncology | Admitting: Radiation Oncology

## 2024-01-21 ENCOUNTER — Other Ambulatory Visit: Payer: Self-pay

## 2024-01-21 ENCOUNTER — Ambulatory Visit
Admission: RE | Admit: 2024-01-21 | Discharge: 2024-01-21 | Disposition: A | Discharge status: Home / Self Care | Payer: Medicare Other | Source: Ambulatory Visit | Attending: Radiation Oncology

## 2024-01-21 DIAGNOSIS — Z510 Encounter for antineoplastic radiation therapy: Secondary | ICD-10-CM | POA: Diagnosis not present

## 2024-01-21 DIAGNOSIS — C61 Malignant neoplasm of prostate: Secondary | ICD-10-CM | POA: Diagnosis not present

## 2024-01-21 DIAGNOSIS — Z191 Hormone sensitive malignancy status: Secondary | ICD-10-CM | POA: Diagnosis not present

## 2024-01-21 LAB — RAD ONC ARIA SESSION SUMMARY
Course Elapsed Days: 45
Plan Fractions Treated to Date: 5
Plan Prescribed Dose Per Fraction: 2 Gy
Plan Total Fractions Prescribed: 15
Plan Total Prescribed Dose: 30 Gy
Reference Point Dosage Given to Date: 10 Gy
Reference Point Session Dosage Given: 2 Gy
Session Number: 30

## 2024-01-24 ENCOUNTER — Ambulatory Visit
Admission: RE | Admit: 2024-01-24 | Discharge: 2024-01-24 | Disposition: A | Discharge status: Home / Self Care | Payer: Medicare Other | Source: Ambulatory Visit | Attending: Radiation Oncology | Admitting: Radiation Oncology

## 2024-01-24 ENCOUNTER — Other Ambulatory Visit: Payer: Self-pay

## 2024-01-24 ENCOUNTER — Inpatient Hospital Stay: Payer: Medicare Other | Attending: Radiation Oncology

## 2024-01-24 DIAGNOSIS — Z510 Encounter for antineoplastic radiation therapy: Secondary | ICD-10-CM | POA: Diagnosis not present

## 2024-01-24 DIAGNOSIS — Z191 Hormone sensitive malignancy status: Secondary | ICD-10-CM | POA: Diagnosis not present

## 2024-01-24 DIAGNOSIS — C61 Malignant neoplasm of prostate: Secondary | ICD-10-CM | POA: Insufficient documentation

## 2024-01-24 LAB — RAD ONC ARIA SESSION SUMMARY
Course Elapsed Days: 48
Plan Fractions Treated to Date: 6
Plan Prescribed Dose Per Fraction: 2 Gy
Plan Total Fractions Prescribed: 15
Plan Total Prescribed Dose: 30 Gy
Reference Point Dosage Given to Date: 12 Gy
Reference Point Session Dosage Given: 2 Gy
Session Number: 31

## 2024-01-25 ENCOUNTER — Ambulatory Visit
Admission: RE | Admit: 2024-01-25 | Discharge: 2024-01-25 | Disposition: A | Discharge status: Home / Self Care | Payer: Medicare Other | Source: Ambulatory Visit | Attending: Radiation Oncology | Admitting: Radiation Oncology

## 2024-01-25 ENCOUNTER — Inpatient Hospital Stay: Payer: Medicare Other

## 2024-01-25 ENCOUNTER — Other Ambulatory Visit: Payer: Self-pay

## 2024-01-25 DIAGNOSIS — Z510 Encounter for antineoplastic radiation therapy: Secondary | ICD-10-CM | POA: Diagnosis not present

## 2024-01-25 DIAGNOSIS — C61 Malignant neoplasm of prostate: Secondary | ICD-10-CM | POA: Diagnosis not present

## 2024-01-25 DIAGNOSIS — Z191 Hormone sensitive malignancy status: Secondary | ICD-10-CM | POA: Diagnosis not present

## 2024-01-25 LAB — RAD ONC ARIA SESSION SUMMARY
Course Elapsed Days: 49
Plan Fractions Treated to Date: 7
Plan Prescribed Dose Per Fraction: 2 Gy
Plan Total Fractions Prescribed: 15
Plan Total Prescribed Dose: 30 Gy
Reference Point Dosage Given to Date: 14 Gy
Reference Point Session Dosage Given: 2 Gy
Session Number: 32

## 2024-01-26 ENCOUNTER — Telehealth: Payer: Self-pay

## 2024-01-26 ENCOUNTER — Other Ambulatory Visit: Payer: Self-pay

## 2024-01-26 ENCOUNTER — Ambulatory Visit
Admission: RE | Admit: 2024-01-26 | Discharge: 2024-01-26 | Disposition: A | Discharge status: Home / Self Care | Payer: Medicare Other | Source: Ambulatory Visit | Attending: Radiation Oncology | Admitting: Radiation Oncology

## 2024-01-26 ENCOUNTER — Inpatient Hospital Stay: Payer: Medicare Other

## 2024-01-26 DIAGNOSIS — C61 Malignant neoplasm of prostate: Secondary | ICD-10-CM | POA: Diagnosis not present

## 2024-01-26 DIAGNOSIS — Z191 Hormone sensitive malignancy status: Secondary | ICD-10-CM | POA: Diagnosis not present

## 2024-01-26 DIAGNOSIS — Z510 Encounter for antineoplastic radiation therapy: Secondary | ICD-10-CM | POA: Diagnosis not present

## 2024-01-26 LAB — RAD ONC ARIA SESSION SUMMARY
Course Elapsed Days: 50
Plan Fractions Treated to Date: 8
Plan Prescribed Dose Per Fraction: 2 Gy
Plan Total Fractions Prescribed: 15
Plan Total Prescribed Dose: 30 Gy
Reference Point Dosage Given to Date: 16 Gy
Reference Point Session Dosage Given: 2 Gy
Session Number: 33

## 2024-01-26 NOTE — Telephone Encounter (Signed)
 [facility] CSW Progress Note  Clinical Social Worker  attempted to reach patient by phone\  to assess pyschosocial needs as he progress through treatment. CSW was unable to reach patient, left vm with direct contact.    Lizbeth Sprague, LCSW Clinical Social Worker [facility]

## 2024-01-27 ENCOUNTER — Ambulatory Visit
Admission: RE | Admit: 2024-01-27 | Discharge: 2024-01-27 | Disposition: A | Discharge status: Home / Self Care | Payer: Medicare Other | Source: Ambulatory Visit | Attending: Radiation Oncology | Admitting: Radiation Oncology

## 2024-01-27 ENCOUNTER — Other Ambulatory Visit: Payer: Self-pay

## 2024-01-27 ENCOUNTER — Inpatient Hospital Stay: Payer: Medicare Other

## 2024-01-27 DIAGNOSIS — Z191 Hormone sensitive malignancy status: Secondary | ICD-10-CM | POA: Diagnosis not present

## 2024-01-27 DIAGNOSIS — Z510 Encounter for antineoplastic radiation therapy: Secondary | ICD-10-CM | POA: Diagnosis not present

## 2024-01-27 DIAGNOSIS — C61 Malignant neoplasm of prostate: Secondary | ICD-10-CM | POA: Diagnosis not present

## 2024-01-27 LAB — RAD ONC ARIA SESSION SUMMARY
Course Elapsed Days: 51
Plan Fractions Treated to Date: 9
Plan Prescribed Dose Per Fraction: 2 Gy
Plan Total Fractions Prescribed: 15
Plan Total Prescribed Dose: 30 Gy
Reference Point Dosage Given to Date: 18 Gy
Reference Point Session Dosage Given: 2 Gy
Session Number: 34

## 2024-01-28 ENCOUNTER — Encounter: Payer: Self-pay | Admitting: *Deleted

## 2024-01-28 ENCOUNTER — Ambulatory Visit
Admission: RE | Admit: 2024-01-28 | Discharge: 2024-01-28 | Disposition: A | Discharge status: Home / Self Care | Payer: Medicare Other | Source: Ambulatory Visit | Attending: Radiation Oncology

## 2024-01-28 ENCOUNTER — Inpatient Hospital Stay: Payer: Medicare Other

## 2024-01-28 ENCOUNTER — Ambulatory Visit
Admission: RE | Admit: 2024-01-28 | Discharge: 2024-01-28 | Disposition: A | Discharge status: Home / Self Care | Payer: Medicare Other | Source: Ambulatory Visit | Attending: Radiation Oncology | Admitting: Radiation Oncology

## 2024-01-28 ENCOUNTER — Other Ambulatory Visit: Payer: Self-pay

## 2024-01-28 DIAGNOSIS — Z510 Encounter for antineoplastic radiation therapy: Secondary | ICD-10-CM | POA: Diagnosis not present

## 2024-01-28 DIAGNOSIS — C61 Malignant neoplasm of prostate: Secondary | ICD-10-CM | POA: Diagnosis not present

## 2024-01-28 DIAGNOSIS — Z191 Hormone sensitive malignancy status: Secondary | ICD-10-CM | POA: Diagnosis not present

## 2024-01-28 LAB — RAD ONC ARIA SESSION SUMMARY
Course Elapsed Days: 52
Plan Fractions Treated to Date: 10
Plan Prescribed Dose Per Fraction: 2 Gy
Plan Total Fractions Prescribed: 15
Plan Total Prescribed Dose: 30 Gy
Reference Point Dosage Given to Date: 20 Gy
Reference Point Session Dosage Given: 2 Gy
Session Number: 35

## 2024-01-29 ENCOUNTER — Other Ambulatory Visit: Payer: Self-pay | Admitting: Urology

## 2024-01-31 ENCOUNTER — Inpatient Hospital Stay: Payer: Medicare Other

## 2024-01-31 ENCOUNTER — Other Ambulatory Visit: Payer: Self-pay

## 2024-01-31 ENCOUNTER — Ambulatory Visit
Admission: RE | Admit: 2024-01-31 | Discharge: 2024-01-31 | Disposition: A | Discharge status: Home / Self Care | Payer: Medicare Other | Source: Ambulatory Visit | Attending: Radiation Oncology

## 2024-01-31 DIAGNOSIS — Z510 Encounter for antineoplastic radiation therapy: Secondary | ICD-10-CM | POA: Diagnosis not present

## 2024-01-31 DIAGNOSIS — Z191 Hormone sensitive malignancy status: Secondary | ICD-10-CM | POA: Diagnosis not present

## 2024-01-31 DIAGNOSIS — C61 Malignant neoplasm of prostate: Secondary | ICD-10-CM | POA: Diagnosis not present

## 2024-01-31 LAB — RAD ONC ARIA SESSION SUMMARY
Course Elapsed Days: 55
Plan Fractions Treated to Date: 11
Plan Prescribed Dose Per Fraction: 2 Gy
Plan Total Fractions Prescribed: 15
Plan Total Prescribed Dose: 30 Gy
Reference Point Dosage Given to Date: 22 Gy
Reference Point Session Dosage Given: 2 Gy
Session Number: 36

## 2024-02-01 ENCOUNTER — Inpatient Hospital Stay: Payer: Medicare Other

## 2024-02-01 ENCOUNTER — Other Ambulatory Visit: Payer: Self-pay | Admitting: Nurse Practitioner

## 2024-02-01 ENCOUNTER — Other Ambulatory Visit: Payer: Self-pay

## 2024-02-01 ENCOUNTER — Ambulatory Visit
Admission: RE | Admit: 2024-02-01 | Discharge: 2024-02-01 | Disposition: A | Discharge status: Home / Self Care | Payer: Medicare Other | Source: Ambulatory Visit | Attending: Radiation Oncology | Admitting: Radiation Oncology

## 2024-02-01 DIAGNOSIS — Z191 Hormone sensitive malignancy status: Secondary | ICD-10-CM | POA: Diagnosis not present

## 2024-02-01 DIAGNOSIS — Z510 Encounter for antineoplastic radiation therapy: Secondary | ICD-10-CM | POA: Diagnosis not present

## 2024-02-01 DIAGNOSIS — C61 Malignant neoplasm of prostate: Secondary | ICD-10-CM | POA: Diagnosis not present

## 2024-02-01 LAB — RAD ONC ARIA SESSION SUMMARY
Course Elapsed Days: 56
Plan Fractions Treated to Date: 12
Plan Prescribed Dose Per Fraction: 2 Gy
Plan Total Fractions Prescribed: 15
Plan Total Prescribed Dose: 30 Gy
Reference Point Dosage Given to Date: 24 Gy
Reference Point Session Dosage Given: 2 Gy
Session Number: 37

## 2024-02-02 ENCOUNTER — Inpatient Hospital Stay: Payer: Medicare Other

## 2024-02-02 ENCOUNTER — Ambulatory Visit: Payer: Medicare Other

## 2024-02-02 ENCOUNTER — Other Ambulatory Visit: Payer: Self-pay

## 2024-02-02 ENCOUNTER — Ambulatory Visit
Admission: RE | Admit: 2024-02-02 | Discharge: 2024-02-02 | Disposition: A | Discharge status: Home / Self Care | Payer: Medicare Other | Source: Ambulatory Visit | Attending: Radiation Oncology | Admitting: Radiation Oncology

## 2024-02-02 DIAGNOSIS — Z510 Encounter for antineoplastic radiation therapy: Secondary | ICD-10-CM | POA: Diagnosis not present

## 2024-02-02 DIAGNOSIS — C61 Malignant neoplasm of prostate: Secondary | ICD-10-CM | POA: Diagnosis not present

## 2024-02-02 DIAGNOSIS — Z191 Hormone sensitive malignancy status: Secondary | ICD-10-CM | POA: Diagnosis not present

## 2024-02-02 LAB — RAD ONC ARIA SESSION SUMMARY
Course Elapsed Days: 57
Plan Fractions Treated to Date: 13
Plan Prescribed Dose Per Fraction: 2 Gy
Plan Total Fractions Prescribed: 15
Plan Total Prescribed Dose: 30 Gy
Reference Point Dosage Given to Date: 26 Gy
Reference Point Session Dosage Given: 2 Gy
Session Number: 38

## 2024-02-02 NOTE — Telephone Encounter (Signed)
Requesting: Metoprolol Tartrate 100 MG Oral Tablet  Last Visit: 06/25/2023 Next Visit: Visit date not found Last Refill: 02/22/2023  Please Advise

## 2024-02-02 NOTE — Telephone Encounter (Signed)
I called and spoke with patient and tried to schedule patient to see you but the earliest appointment was 03/22/24 and you had several same day appointments. I told patient that I would check with you were to  schedule him in at and call him back. He said if you were not available that he would like to see Dr. Veto Kemps.

## 2024-02-03 ENCOUNTER — Ambulatory Visit
Admission: RE | Admit: 2024-02-03 | Discharge: 2024-02-03 | Discharge status: Home / Self Care | Payer: Medicare Other | Source: Ambulatory Visit | Attending: Radiation Oncology

## 2024-02-03 ENCOUNTER — Other Ambulatory Visit: Payer: Self-pay

## 2024-02-03 ENCOUNTER — Ambulatory Visit
Admission: RE | Admit: 2024-02-03 | Discharge: 2024-02-03 | Disposition: A | Discharge status: Home / Self Care | Payer: Medicare Other | Source: Ambulatory Visit | Attending: Radiation Oncology

## 2024-02-03 ENCOUNTER — Inpatient Hospital Stay: Payer: Medicare Other

## 2024-02-03 ENCOUNTER — Ambulatory Visit: Payer: Medicare Other | Admitting: Urology

## 2024-02-03 ENCOUNTER — Encounter: Payer: Self-pay | Admitting: *Deleted

## 2024-02-03 ENCOUNTER — Ambulatory Visit: Payer: Medicare Other

## 2024-02-03 DIAGNOSIS — Z191 Hormone sensitive malignancy status: Secondary | ICD-10-CM | POA: Diagnosis not present

## 2024-02-03 DIAGNOSIS — Z510 Encounter for antineoplastic radiation therapy: Secondary | ICD-10-CM | POA: Diagnosis not present

## 2024-02-03 DIAGNOSIS — C61 Malignant neoplasm of prostate: Secondary | ICD-10-CM | POA: Diagnosis not present

## 2024-02-03 LAB — RAD ONC ARIA SESSION SUMMARY
Course Elapsed Days: 58
Plan Fractions Treated to Date: 14
Plan Prescribed Dose Per Fraction: 2 Gy
Plan Total Fractions Prescribed: 15
Plan Total Prescribed Dose: 30 Gy
Reference Point Dosage Given to Date: 28 Gy
Reference Point Session Dosage Given: 2 Gy
Session Number: 39

## 2024-02-03 NOTE — Telephone Encounter (Signed)
I tried to call patient and no answer and he does not know how to check his voicemail. I will send patient a message via mychart.

## 2024-02-03 NOTE — Telephone Encounter (Addendum)
I called and spoke with patient and tried to get patient scheduled to see Lauren but no availability till April. Patient refused appointment and wanted to be scheuled with Dr. Veto Kemps.  I asked patient if I could let him speak with front office staff to get him and appointment and they got patient scheduled with Dr. Veto Kemps on 02/14/24.

## 2024-02-04 ENCOUNTER — Ambulatory Visit
Admission: RE | Admit: 2024-02-04 | Discharge: 2024-02-04 | Disposition: A | Discharge status: Home / Self Care | Payer: Medicare Other | Source: Ambulatory Visit | Attending: Radiation Oncology | Admitting: Radiation Oncology

## 2024-02-04 ENCOUNTER — Inpatient Hospital Stay: Payer: Medicare Other

## 2024-02-04 ENCOUNTER — Other Ambulatory Visit: Payer: Self-pay

## 2024-02-04 DIAGNOSIS — Z510 Encounter for antineoplastic radiation therapy: Secondary | ICD-10-CM | POA: Diagnosis not present

## 2024-02-04 DIAGNOSIS — Z191 Hormone sensitive malignancy status: Secondary | ICD-10-CM | POA: Diagnosis not present

## 2024-02-04 DIAGNOSIS — C61 Malignant neoplasm of prostate: Secondary | ICD-10-CM

## 2024-02-04 LAB — RAD ONC ARIA SESSION SUMMARY
Course Elapsed Days: 59
Plan Fractions Treated to Date: 15
Plan Prescribed Dose Per Fraction: 2 Gy
Plan Total Fractions Prescribed: 15
Plan Total Prescribed Dose: 30 Gy
Reference Point Dosage Given to Date: 30 Gy
Reference Point Session Dosage Given: 2 Gy
Session Number: 40

## 2024-02-07 NOTE — Radiation Completion Notes (Addendum)
  Radiation Oncology         [phone] ________________________________  Name: Richard Harding MRN: [id number]  Date: 02/04/2024  DOB: 04-05-1946  Referring Physician: [name], M.D. Date of Service: 2024-02-07 Radiation Oncologist: Margaretmary Bayley, M.D. [facility]     RADIATION ONCOLOGY END OF TREATMENT NOTE     Diagnosis:  78 y.o. 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   Intent: Curative     ==========DELIVERED PLANS==========  First Treatment Date: 2023-12-07 Last Treatment Date: 2024-02-04   Plan Name: Prostate_Pelv Site: Prostate Technique: IMRT Mode: Photon Dose Per Fraction: 1.8 Gy Prescribed Dose (Delivered / Prescribed): 45 Gy / 45 Gy Prescribed Fxs (Delivered / Prescribed): 25 / 25   Plan Name: Prostate_Bst Site: Prostate Technique: IMRT Mode: Photon Dose Per Fraction: 2 Gy Prescribed Dose (Delivered / Prescribed): 30 Gy / 30 Gy Prescribed Fxs (Delivered / Prescribed): 15 / 15     ==========ON TREATMENT VISIT DATES========== 2023-12-10, 2023-12-17, 2023-12-24, 2024-01-03, 2024-01-07, 2024-01-21, 2024-01-28, 2024-02-03   See weekly On Treatment Notes in Epic for details in the Media tab (listed as Progress notes on the On Treatment Visit Dates listed above).  He tolerated the radiation treatments relatively well with only modest fatigue and increased nocturia.  His LUTS were managed with Flomax daily as prescribed.   The patient will receive a call in about one month from the radiation oncology department. He will continue follow up with his urologist, Dr. Pete Glatter, as well.  ------------------------------------------------   Margaretmary Dys, MD [facility]  Radiation Oncology Direct Dial: [phone]  Fax: [phone] [email or web address]  Skype  LinkedIn

## 2024-02-08 ENCOUNTER — Other Ambulatory Visit: Payer: Self-pay | Admitting: Urology

## 2024-02-08 MED ORDER — NITROFURANTOIN MONOHYD MACRO 100 MG PO CAPS: 100.0000 mg | ORAL_CAPSULE | Freq: Every day | ORAL | 2 refills | Status: DC

## 2024-02-08 NOTE — Progress Notes (Signed)
Patient was a RadOnc Consult on 10/22/23 for his stage cT2a (RadT3) adenocarcinoma of the prostate with Gleason score of 4+5, and PSA of 97.2.  Patient proceed with treatment recommendations of 8 weeks of external beam therapy concurrent with ADT and had his final radiation treatment on 02/04/24.   Patient is scheduled for a post treatment nurse call on 03/07/24 and has a follow up for additional Eligard and MD visit on 5/21.   RN left message for call back to review post treatment PSA monitoring.

## 2024-02-14 ENCOUNTER — Ambulatory Visit (INDEPENDENT_AMBULATORY_CARE_PROVIDER_SITE_OTHER): Payer: Medicare Other | Admitting: Family Medicine

## 2024-02-14 ENCOUNTER — Encounter: Payer: Self-pay | Admitting: Family Medicine

## 2024-02-14 VITALS — BP 119/72 | HR 77 | Temp 97.7°F | Resp 18 | Wt 189.2 lb

## 2024-02-14 DIAGNOSIS — Z96652 Presence of left artificial knee joint: Secondary | ICD-10-CM

## 2024-02-14 DIAGNOSIS — N401 Enlarged prostate with lower urinary tract symptoms: Secondary | ICD-10-CM

## 2024-02-14 DIAGNOSIS — M1712 Unilateral primary osteoarthritis, left knee: Secondary | ICD-10-CM

## 2024-02-14 DIAGNOSIS — Z7984 Long term (current) use of oral hypoglycemic drugs: Secondary | ICD-10-CM

## 2024-02-14 DIAGNOSIS — E1151 Type 2 diabetes mellitus with diabetic peripheral angiopathy without gangrene: Secondary | ICD-10-CM

## 2024-02-14 DIAGNOSIS — I10 Essential (primary) hypertension: Secondary | ICD-10-CM | POA: Diagnosis not present

## 2024-02-14 DIAGNOSIS — E785 Hyperlipidemia, unspecified: Secondary | ICD-10-CM | POA: Diagnosis not present

## 2024-02-14 DIAGNOSIS — C61 Malignant neoplasm of prostate: Secondary | ICD-10-CM | POA: Diagnosis not present

## 2024-02-14 DIAGNOSIS — N138 Other obstructive and reflux uropathy: Secondary | ICD-10-CM

## 2024-02-14 LAB — URINALYSIS, ROUTINE W REFLEX MICROSCOPIC
Bilirubin Urine: NEGATIVE
Hgb urine dipstick: NEGATIVE
Ketones, ur: NEGATIVE
Leukocytes,Ua: NEGATIVE
Nitrite: NEGATIVE
RBC / HPF: NONE SEEN (ref 0–?)
Specific Gravity, Urine: 1.015 (ref 1.000–1.030)
Total Protein, Urine: NEGATIVE
Urine Glucose: NEGATIVE
Urobilinogen, UA: 0.2 (ref 0.0–1.0)
WBC, UA: NONE SEEN (ref 0–?)
pH: 7 (ref 5.0–8.0)

## 2024-02-14 LAB — HEMOGLOBIN A1C: Hgb A1c MFr Bld: 6.7 % — ABNORMAL HIGH (ref 4.6–6.5)

## 2024-02-14 LAB — PSA: PSA: 0.18 ng/mL (ref 0.10–4.00)

## 2024-02-14 MED ORDER — TAMSULOSIN HCL 0.4 MG PO CAPS
0.4000 mg | ORAL_CAPSULE | Freq: Every day | ORAL | 3 refills | Status: DC
Start: 2024-02-14 — End: 2024-05-10

## 2024-02-14 NOTE — Assessment & Plan Note (Signed)
 I will reassess lipids. Continue use of atorvastatin 10 mg daily, fenofibrate 54 mg daily and Lovaza 1 gm qid.

## 2024-02-14 NOTE — Progress Notes (Signed)
 [facility] [address] [place] [address] Dept: [phone] Dept Fax: [phone]  Chronic Care Office Visit  Subjective:    Patient ID: Richard Harding, male    DOB: 1946-11-26, 78 y.o..   MRN: [id number]  Chief Complaint  Patient presents with   medical managment of chronic issues     3 month follow up DM. PT is requesting PSA lab work and 90 day supply for Flomax 0.4mg      History of Present Illness:  Patient is in today for reassessment of chronic medical issues.  [name] has a history of Type 2 diabetes. He is managed on metformin XR 500 mg bid.  [name] has hypertensin. He is managed on chlorthalidone 25 mg daily and metoprolol tartrate 100 mg bid.  [name] has hyperlipidemia with hypertriglyceridemia. He is managed on atorvastatin 10 mg daily, fenofibrate 54 mg daily and Lovaza 1 gm four times a day. He has had an issue with his insurance approving his Lovaza. He notes this comes up every year and Ms. McElwee has had to write letters regarding getting the insurance to approve this.  [name] has prostate cancer. Last week, he completed 40 sessions of radiation treatment. He is very concerned about how his PSA test might be at this point. He admits that he still has some low energy.   [name] has a history of bilateral knee joint replacements. He notes his left knee has been swollen and hurting more over the past month or so. He feels this bulges anteriorly when standing. He ahs not been back to see Dr. Magnus Ivan.  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   Hypokalemia 01/12/2019   DM (diabetes mellitus) type II, controlled, with peripheral vascular disorder (HCC) 09/16/2017   Essential hypertension 01/18/2014   Hyperlipidemia LDL goal <70 01/18/2014   Obesity (BMI 30-39.9) 01/18/2014   Past Surgical History:  Procedure Laterality Date   ANTERIOR FUSION CERVICAL SPINE     1990s   BILATERAL CARPAL TUNNEL RELEASE Bilateral    1990s   COLONOSCOPY  2011   GOLD SEED IMPLANT N/A 11/23/2023   Procedure: GOLD SEED IMPLANT;  Surgeon: Milderd Meager., MD;  Location: [facility];  Service: Urology;  Laterality: N/A;   SPACE OAR INSTILLATION N/A 11/23/2023   Procedure: SPACE OAR INSTILLATION;  Surgeon: Milderd Meager., MD;  Location[facility];  Service: Urology;  Laterality: N/A;   TOTAL KNEE ARTHROPLASTY Right 04/26/2020   Procedure: RIGHT TOTAL KNEE ARTHROPLASTY;  Surgeon: Kathryne Hitch, MD;  Location: [facility];  Service: Orthopedics;  Laterality: Right;   TOTAL KNEE ARTHROPLASTY Left 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 No pertinent family history. Outpatient Medications Prior to Visit  Medication Sig Dispense Refill   Ascorbic Acid (VITAMIN C) 1000 MG  tablet Take 1,000 mg by mouth at bedtime.     aspirin 81 MG chewable tablet Chew 1 tablet (81 mg total) by mouth 2 (two) times daily. (Patient taking differently: Chew 81 mg by mouth daily.) 30 tablet 0   atorvastatin (LIPITOR) 10 MG tablet Take 1 tablet (10 mg total) by mouth daily. (Patient taking differently: Take 10 mg by mouth daily.) 90 tablet 3   b complex vitamins capsule Take 1 capsule by mouth daily.     benzonatate (TESSALON)  100 MG capsule Take 1 capsule (100 mg total) by mouth 2 (two) times daily as needed for cough. 60 capsule 0   CALCIUM-VITAMIN D-VITAMIN K PO Take 1 tablet by mouth daily.     chlorthalidone (HYGROTON) 25 MG tablet Take 1 tablet (25 mg total) by mouth daily. (Patient taking differently: Take 25 mg by mouth daily.) 90 tablet 3   COMIRNATY syringe      fenofibrate 54 MG tablet Take 1 tablet (54 mg total) by mouth daily. (Patient taking differently: Take 54 mg by mouth daily.) 90 tablet 3   FLUZONE HIGH-DOSE 0.5 ML injection      loratadine (CLARITIN) 10 MG tablet Take 10 mg by mouth daily.     metFORMIN (GLUCOPHAGE-XR) 500 MG 24 hr tablet TAKE 1 TABLET BY MOUTH TWICE DAILY WITH A MEAL 180 tablet 0   metoprolol tartrate (LOPRESSOR) 100 MG tablet Take 1 tablet by mouth twice daily 180 tablet 0   naproxen sodium (ALEVE) 220 MG tablet Take 220 mg by mouth 2 (two) times daily as needed.     nitrofurantoin, macrocrystal-monohydrate, (MACROBID) 100 MG capsule Take 1 capsule (100 mg total) by mouth daily. 30 capsule 2   polyvinyl alcohol (LIQUIFILM TEARS) 1.4 % ophthalmic solution Place 1 drop into both eyes as needed for dry eyes.     solifenacin (VESICARE) 5 MG tablet Take 1 tablet (5 mg total) by mouth daily. 90 tablet 1   triamcinolone ointment (KENALOG) 0.1 % Apply 1 Application topically 2 (two) times daily as needed for rash.     tamsulosin (FLOMAX) 0.4 MG CAPS capsule Take 1 capsule (0.4 mg total) by mouth daily. (Patient taking differently: Take 0.4 mg by mouth daily.) 30 capsule 11   omega-3 acid ethyl esters (LOVAZA) 1 g capsule Take 1 capsule by mouth 4 times daily (Patient not taking: Reported on 02/14/2024) 180 capsule 0   No facility-administered medications prior to visit.   Allergies  Allergen Reactions   Cinnamon Flavoring Agent (Non-Screening) Other (See Comments)    Cinnamon chewing gum caused blisters on tongue   Objective:   Today's Vitals   02/14/24 1402  BP: 119/72  Pulse: 77   Resp: 18  Temp: 97.7 F (36.5 C)  TempSrc: Temporal  SpO2: 100%  Weight: 189 lb 3.2 oz (85.8 kg)  PainSc: 0-No pain   Body mass index is 27.94 kg/m.   General: Well developed, well nourished. No acute distress. Extremities: The left knee is swollen and has increased warmth compared to the right. There is +/- some   effusion present. Skin: Warm and dry. No rashes. Psych: Alert and oriented. Normal mood and affect.  Health Maintenance Due  Topic Date Due   INFLUENZA VACCINE  07/22/2023   COVID-19 Vaccine (5 - 2024-25 season) 08/22/2023   HEMOGLOBIN A1C  12/26/2023   Diabetic kidney evaluation - eGFR measurement  02/22/2024   Diabetic kidney evaluation - Urine ACR  02/22/2024     Assessment & Plan:  Problem List Items Addressed This Visit       Cardiovascular and Mediastinum   DM (diabetes mellitus) type II, controlled, with peripheral vascular disorder (HCC) - Primary   I will check annual DM labs today. Continue metformin XR 500 mg bid.      Relevant Orders   Lipid panel   Microalbumin / creatinine urine ratio   Basic metabolic panel   Hemoglobin A1c   Urinalysis, Routine w reflex microscopic   Essential hypertension   BP is at goal. Continue chlorthalidone 25 mg daily and metoprolol tartrate 100 mg bid.        Musculoskeletal and Integument   Arthritis of left knee   The left knee does have increased warmth and swelling with a possible effusion. I will refer him back to orthopedics for further evaluation.       Relevant Orders   Ambulatory referral to Orthopedics     Genitourinary   BPH with obstruction/lower urinary tract symptoms   Continue tamsulosin 0.4 mg daily.      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      Relevant Orders   PSA     Other   History of left knee replacement   As above.      Relevant Orders    Ambulatory referral to Orthopedics   Hyperlipidemia LDL goal <70   I will reassess lipids. Continue use of atorvastatin 10 mg daily, fenofibrate 54 mg daily and Lovaza 1 gm qid.      Relevant Orders   Lipid panel    Return in about 3 months (around 05/13/2024) for Reassessment wiht PCP.   Loyola Mast, MD

## 2024-02-14 NOTE — Assessment & Plan Note (Signed)
 I will reassess his PSA as requested.

## 2024-02-14 NOTE — Assessment & Plan Note (Signed)
 As above.

## 2024-02-14 NOTE — Assessment & Plan Note (Signed)
 I will check annual DM labs today. Continue metformin XR 500 mg bid.

## 2024-02-14 NOTE — Assessment & Plan Note (Signed)
 The left knee does have increased warmth and swelling with a possible effusion. I will refer him back to orthopedics for further evaluation.

## 2024-02-14 NOTE — Assessment & Plan Note (Signed)
 BP is at goal. Continue chlorthalidone 25 mg daily and metoprolol tartrate 100 mg bid.

## 2024-02-14 NOTE — Assessment & Plan Note (Signed)
Continue tamsulosin 0.4mg daily

## 2024-02-15 ENCOUNTER — Encounter: Payer: Self-pay | Admitting: Family Medicine

## 2024-02-15 ENCOUNTER — Telehealth: Payer: Self-pay

## 2024-02-15 DIAGNOSIS — E785 Hyperlipidemia, unspecified: Secondary | ICD-10-CM

## 2024-02-15 LAB — BASIC METABOLIC PANEL
BUN: 17 mg/dL (ref 6–23)
CO2: 28 meq/L (ref 19–32)
Calcium: 10 mg/dL (ref 8.4–10.5)
Chloride: 96 meq/L (ref 96–112)
Creatinine, Ser: 1.08 mg/dL (ref 0.40–1.50)
GFR: 66.58 mL/min (ref >60.00)
Glucose, Bld: 113 mg/dL — ABNORMAL HIGH (ref 70–99)
Potassium: 3.5 meq/L (ref 3.5–5.1)
Sodium: 139 meq/L (ref 135–145)

## 2024-02-15 LAB — MICROALBUMIN / CREATININE URINE RATIO
Creatinine,U: 99 mg/dL
Microalb Creat Ratio: 7.1 mg/g (ref 0.0–30.0)
Microalb, Ur: <0.7 mg/dL (ref 0.0–1.9)

## 2024-02-15 LAB — LIPID PANEL
Cholesterol: 142 mg/dL (ref 0–200)
HDL: 37.3 mg/dL — ABNORMAL LOW (ref >39.00)
LDL Cholesterol: 57 mg/dL (ref 0–99)
NonHDL: 104.97
Total CHOL/HDL Ratio: 4
Triglycerides: 242 mg/dL — ABNORMAL HIGH (ref 0.0–149.0)
VLDL: 48.4 mg/dL — ABNORMAL HIGH (ref 0.0–40.0)

## 2024-02-15 NOTE — Telephone Encounter (Signed)
 I called and spoke with a [employer] representative and got fax number to fax letter to [facility] at [phone].  Letter that was written for medication exception, the letter from [employer] that was sent to patient on 12/06/24, and patient's insurance card was faxed to [facility].

## 2024-02-15 NOTE — Telephone Encounter (Signed)
 I called patient and notified him that Lauren wrote a letter for an exception to his medication of Lovaza and will await for an answer from [facility]. Patient was grateful that Lauren did this for him. A copy of letter dated 12/*17/24 will be scanned into patient's chart.

## 2024-02-17 MED ORDER — OMEGA-3-ACID ETHYL ESTERS 1 G PO CAPS: 2.0000 g | ORAL_CAPSULE | Freq: Two times a day (BID) | ORAL | 3 refills | Status: AC

## 2024-02-17 NOTE — Addendum Note (Signed)
 Addended by: Malena Peer Y on: 02/17/2024 09:22 AM   Modules accepted: Orders

## 2024-02-17 NOTE — Telephone Encounter (Signed)
 Patient aware that medication sent to pharmacy

## 2024-02-17 NOTE — Addendum Note (Signed)
 Addended by: Rodman Pickle A on: 02/17/2024 01:33 PM   Modules accepted: Orders

## 2024-02-17 NOTE — Telephone Encounter (Signed)
 I called and spoke with patient and notified him that we received an approval of Omega-3-Acid Ethyl Esters 1 gm Capsule. Patient was so thankful and request a refill of this medicine for 90 days with refills for 1 year.

## 2024-02-25 ENCOUNTER — Other Ambulatory Visit: Payer: Self-pay | Admitting: Nurse Practitioner

## 2024-02-25 DIAGNOSIS — I10 Essential (primary) hypertension: Secondary | ICD-10-CM

## 2024-02-25 NOTE — Telephone Encounter (Signed)
 Requesting: Chlorthalidone 25 MG Oral Tablet  Last Visit: 06/25/2023 Next Visit: 05/17/2024 Last Refill: 02/22/2023  Please Advise

## 2024-02-28 ENCOUNTER — Encounter: Payer: Self-pay | Admitting: Physician Assistant

## 2024-02-28 ENCOUNTER — Ambulatory Visit: Payer: Medicare Other | Admitting: Physician Assistant

## 2024-02-28 ENCOUNTER — Other Ambulatory Visit (INDEPENDENT_AMBULATORY_CARE_PROVIDER_SITE_OTHER): Payer: Self-pay

## 2024-02-28 DIAGNOSIS — G8929 Other chronic pain: Secondary | ICD-10-CM

## 2024-02-28 DIAGNOSIS — Z96652 Presence of left artificial knee joint: Secondary | ICD-10-CM | POA: Diagnosis not present

## 2024-02-28 DIAGNOSIS — M25562 Pain in left knee: Secondary | ICD-10-CM

## 2024-02-28 NOTE — Progress Notes (Signed)
 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  A has not been seen since 11/05/2022.  He states he has been dealing with prostate cancer.  He states he has had pain in his left knee since surgery.  He states his right knee did well and has no complaints.  He notes swelling of the left knee.  States the knee feels tight and feels like this inhibits his range of motion.  He denies any fevers chills.  However she is treated for possible UTI and just finished up antibiotics 7 to 10 days ago.  He has had no new injury to the knee.  Review of systems: See HPI otherwise negative  Physical exam: General Well-developed well-nourished male no acute distress mood affect appropriate.  Able to get on and off the exam table on his own walks with antalgic gait on the left. Respirations: Unlabored Bilateral knees: Good range of motion of both knees.  No instability valgus varus stress in either knee.  Slight warmth left knee compared to the right.  No erythema or ecchymosis either knee.  Slight effusion left knee no effusion right knee.   Radiographs: Left knee 2 views: Well-seated total knee arthroplasty without any evidence of loosening or hardware failure.  No acute fractures.  Knee is well located.  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  We were able to obtain approximately 10 cc of cloudy aspirate.  Will send this for cell count, crystals, culture and sensitivity.  Also obtained sed rate CRP and CBC.  Will see him back in just 1 week to go over the results of the labs and discuss further treatment.  Questions were encouraged and answered at length.

## 2024-03-03 LAB — CBC WITH DIFFERENTIAL/PLATELET
Absolute Lymphocytes: 823 {cells}/uL — ABNORMAL LOW (ref 850–3900)
Absolute Monocytes: 515 {cells}/uL (ref 200–950)
Basophils Absolute: 20 {cells}/uL (ref 0–200)
Basophils Relative: 0.4 %
Eosinophils Absolute: 191 {cells}/uL (ref 15–500)
Eosinophils Relative: 3.9 %
HCT: 32.2 % — ABNORMAL LOW (ref 38.5–50.0)
Hemoglobin: 10.5 g/dL — ABNORMAL LOW (ref 13.2–17.1)
MCH: 28.1 pg (ref 27.0–33.0)
MCHC: 32.6 g/dL (ref 32.0–36.0)
MCV: 86.1 fL (ref 80.0–100.0)
MPV: 10 fL (ref 7.5–12.5)
Monocytes Relative: 10.5 %
Neutro Abs: 3352 {cells}/uL (ref 1500–7800)
Neutrophils Relative %: 68.4 %
Platelets: 434 10*3/uL — ABNORMAL HIGH (ref 140–400)
RBC: 3.74 10*6/uL — ABNORMAL LOW (ref 4.20–5.80)
RDW: 13.6 % (ref 11.0–15.0)
Total Lymphocyte: 16.8 %
WBC: 4.9 10*3/uL (ref 3.8–10.8)

## 2024-03-03 LAB — GRAM STAIN
MICRO NUMBER:: 16202500
SPECIMEN QUALITY:: ADEQUATE

## 2024-03-03 LAB — SYNOVIAL FLUID ANALYSIS, COMPLETE
Basophils, %: 0 %
Eosinophils-Synovial: 0 % (ref 0–2)
Lymphocytes-Synovial Fld: 18 % (ref 0–74)
Monocyte/Macrophage: 0 % (ref 0–69)
Neutrophil, Synovial: 82 % — ABNORMAL HIGH (ref 0–24)
Synoviocytes, %: 0 % (ref 0–15)
WBC, Synovial: 49400 {cells}/uL — ABNORMAL HIGH (ref <150)

## 2024-03-03 LAB — C-REACTIVE PROTEIN: CRP: 7 mg/L (ref <8.0)

## 2024-03-03 LAB — SEDIMENTATION RATE: Sed Rate: 48 mm/h — ABNORMAL HIGH (ref 0–20)

## 2024-03-06 ENCOUNTER — Encounter: Payer: Self-pay | Admitting: Physician Assistant

## 2024-03-06 ENCOUNTER — Ambulatory Visit: Admitting: Physician Assistant

## 2024-03-06 DIAGNOSIS — Z96659 Presence of unspecified artificial knee joint: Secondary | ICD-10-CM

## 2024-03-06 DIAGNOSIS — T8459XD Infection and inflammatory reaction due to other internal joint prosthesis, subsequent encounter: Secondary | ICD-10-CM | POA: Diagnosis not present

## 2024-03-06 NOTE — Progress Notes (Signed)
 HPI: [name] returns today to go over the lab work from his left knee aspiration.  Continues to have pain in the knee.  Denies any fevers chills.  Again states that he has had pain in the left knee since surgery 09/25/2022.  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  Both knees were [employer] press-fit.  He had recently been treated for UTI and finished up with antibiotics just a week prior to being seen on 02/28/2024.  Labs: Gram stain: Shows no organisms. Rare polymorphonuclear leukocytes. Synovial fluid showed white blood cells 49,400 Sed rate 48 CRP normal 7.0  Review of systems: See HPI otherwise negative  Physical exam: General Well-developed well-nourished no acute distress noted no evidence of sepsis. Bilateral knees good range of motion.  Left knee slight warmth compared to the right knee.  No erythema or ecchymosis.  Effusion left knee.  Impression: Infected left total knee arthroplasty  Plan: Given his laboratory continued pain in the knee pain and recurrent effusion recommend left total knee excision versus I&D with poly exchange.  Will obtain cultures at the time of surgery.  Most likely will require IV antibiotics PICC line.  Questions were encouraged and answered patient by Dr. Raye Sorrow 7.  Follow-up with [place] 2 weeks postop.  Protocol for excision versus poly exchange.  Will work on getting him scheduled in the next 2 weeks.

## 2024-03-07 ENCOUNTER — Other Ambulatory Visit: Payer: Self-pay | Admitting: Nurse Practitioner

## 2024-03-07 ENCOUNTER — Ambulatory Visit
Admission: RE | Admit: 2024-03-07 | Discharge: 2024-03-07 | Disposition: A | Discharge status: Home / Self Care | Payer: Medicare Other | Source: Ambulatory Visit | Attending: Radiation Oncology | Admitting: Radiation Oncology

## 2024-03-07 DIAGNOSIS — Z510 Encounter for antineoplastic radiation therapy: Secondary | ICD-10-CM | POA: Insufficient documentation

## 2024-03-07 DIAGNOSIS — C61 Malignant neoplasm of prostate: Secondary | ICD-10-CM | POA: Insufficient documentation

## 2024-03-07 NOTE — Telephone Encounter (Signed)
 Prescription Request  03/07/2024  LOV: 06/25/2023  What is the name of the medication or equipment? tamsulosin (FLOMAX) 0.4 MG CAPS capsule [[id number]] and loratadine (CLARITIN) 10 MG tablet [[id number]]   Have you contacted your pharmacy to request a refill? No, pt walked in and gave me the bottles for Lauren to look at. We can put in bag for him to pick up later.   Which pharmacy would you like this sent to?  [facility] [address] [place], [place] - [address] [place] [address] Phone: [phone] Fax: [phone]    Patient notified that their request is being sent to the clinical staff for review and that they should receive a response within 2 business days.   Please advise at [facility] [phone]

## 2024-03-07 NOTE — Progress Notes (Signed)
  Radiation Oncology         [phone] ________________________________  Name: Richard Harding MRN: [id number]  Date of Service: 03/07/2024  DOB: 1946-11-10  Post Treatment Telephone Note  Diagnosis:  C61 Malignant neoplasm of prostate (as documented in provider EOT note)  Pre Treatment IPSS Score: 15 (as documented in the provider consult note)  The patient was available for call today.   Symptoms of fatigue have improved since completing therapy.  Symptoms of bladder changes have improved since completing therapy. Current symptoms include none, and medications for bladder symptoms include Tamsulosin.  Symptoms of bowel changes have improved since completing therapy. Current symptoms include none, and medications for bowel symptoms include none.   Post Treatment IPSS Score: IPSS Questionnaire (AUA-7): Over the past month.   1)  How often have you had a sensation of not emptying your bladder completely after you finish urinating?  0 - Not at all  2)  How often have you had to urinate again less than two hours after you finished urinating? 1 - Less than 1 time in 5  3)  How often have you found you stopped and started again several times when you urinated?  0 - Not at all  4) How difficult have you found it to postpone urination?  0 - Not at all  5) How often have you had a weak urinary stream?  0 - Not at all  6) How often have you had to push or strain to begin urination?  0 - Not at all  7) How many times did you most typically get up to urinate from the time you went to bed until the time you got up in the morning?  1 - 1 time  Total score:  2. Which indicates mild symptoms  0-7 mildly symptomatic   8-19 moderately symptomatic   20-35 severely symptomatic    Patient has a scheduled follow up visit with his urologist, Dr. Pete Glatter, on 04/2024 for ongoing surveillance. He was counseled that PSA levels will be drawn in the urology office, and was reassured that additional  time is expected to improve bowel and bladder symptoms. He was encouraged to call back with concerns or questions regarding radiation.   This concludes the interaction.  Richard Favors, LPN

## 2024-03-07 NOTE — Telephone Encounter (Signed)
 Requesting:  tamsulosin (FLOMAX) 0.4 MG CAPS capsule and loratadine (CLARITIN) 10 MG tablet  Last Visit: 06/25/2023 Next Visit: 05/17/2024 Last Refill: 02/14/24 by Dr. Madaline Savage 90 with 3 refills,  10/01/23 by Historical Provider  Please Advise

## 2024-03-08 ENCOUNTER — Telehealth: Payer: Self-pay

## 2024-03-08 MED ORDER — LORATADINE 10 MG PO TABS: 10.0000 mg | ORAL_TABLET | Freq: Every day | ORAL | 3 refills | Status: DC

## 2024-03-08 NOTE — Telephone Encounter (Signed)
 I called and spoke with patient and notified him that his medication was refill and he was prescribed Flomax on 02/14/24 qty 90 with 3 refills by Dr. Veto Kemps.

## 2024-03-08 NOTE — Telephone Encounter (Signed)
 I called patient to discuss scheduling knee surgery.  He wants me to ask you questions and talk directly to you about his situation.  He said his knee hurts about 3 or 4 out of 10.  He is tired of having surgeries, appointments and treatments.  Other than the knee surgeries, he has been going through prostate cancer treatment.  PSA is down, not clear if he is in remission.  How dangerous would it be for him to wait several months to a year to have the knee procedure?  Does his cancer status need to be determined before he can have surgery?  He is concerned that he has no one to help him after surgery.  We discussed him going to SNF but he went to [facility] (I think across the street) after his first knee surgery.  He said it was like a crack house and would not want to go there again.  He wants me to call him back after relaying this information to you and getting your input.

## 2024-03-13 NOTE — Telephone Encounter (Signed)
Spoke with patient today and scheduled surgery. 

## 2024-03-18 NOTE — Progress Notes (Signed)
 COVID Vaccine received:  []  No [x]  Yes Date of any COVID positive Test in last 90 days:  PCP - Herbie Drape, MD, Rodman Pickle, NP Cardiologist - one  Chest x-ray -  EKG - 11-23-2023  Epic  Stress Test -  ECHO -  Cardiac Cath -   PCR screen: [x]  Ordered & Completed []   No Order but Needs PROFEND     []   N/A for this surgery  Surgery Plan:  [x]  Ambulatory   []  Outpatient in bed  []  Admit Anesthesia:    []  General  []  Spinal  [x]   Choice []   MAC  Pacemaker / ICD device [x]  No []  Yes   Spinal Cord Stimulator:[x]  No []  Yes       History of Sleep Apnea? [x]  No []  Yes   CPAP used?- [x]  No []  Yes    Does the patient monitor blood sugar?   []  N/A   []  No []  Yes  Patient has: []  NO Hx DM   []  Pre-DM   []  DM1  []   DM2 Last A1c was: 6.7  on   02-14-24   Does patient have a [employer] or Dexcom? []  No []  Yes   Fasting Blood Sugar Ranges-  Checks Blood Sugar _____ times a day  METFORMIN XR- 500mg  bid,  Hold DOS  Blood Thinner / Instructions:None Aspirin Instructions:ASA 81 mg  ERAS Protocol Ordered: []  No  [x]  Yes PRE-SURGERY []  ENSURE  [x]  G2   Patient is to be NPO after: 1100  Dental hx: []  Dentures:  []  N/A      []  Bridge or Partial:                   []  Loose or Damaged teeth:   Comments: Patient was given the 5 CHG shower / bath instructions for TKA surgery along with 2 bottles of the CHG soap. Patient will start this on: 03-20-2024 All questions were asked and answered, Patient voiced understanding of this process.   Activity level: Patient is able / unable to climb a flight of stairs without difficulty; []  No CP  []  No SOB, but would have ___   Patient can / can not perform ADLs without assistance.   Anesthesia review: DM2, HTN, RBBB, current radiation prostate cancer.   Patient denies shortness of breath, fever, cough and chest pain at PAT appointment.  Patient verbalized understanding and agreement to the Pre-Surgical Instructions that were given to them at this PAT  appointment. Patient was also educated of the need to review these PAT instructions again prior to his surgery.I reviewed the appropriate phone numbers to call if they have any and questions or concerns.

## 2024-03-18 NOTE — Patient Instructions (Signed)
 SURGICAL WAITING ROOM VISITATION Patients having surgery or a procedure may have no more than 2 support people in the waiting area - these visitors may rotate in the visitor waiting room.   If the patient needs to stay at the hospital during part of their recovery, the visitor guidelines for inpatient rooms apply.  PRE-OP VISITATION  Pre-op nurse will coordinate an appropriate time for 1 support person to accompany the patient in pre-op.  This support person may not rotate.  This visitor will be contacted when the time is appropriate for the visitor to come back in the pre-op area.  Please refer to the [facility] website for the visitor guidelines for Inpatients (after your surgery is over and you are in a regular room).  You are not required to quarantine at this time prior to your surgery. However, you must do this: Hand Hygiene often Do NOT share personal items Notify your provider if you are in close contact with someone who has COVID or you develop fever 100.4 or greater, new onset of sneezing, cough, sore throat, shortness of breath or body aches.  If you test positive for Covid or have been in contact with anyone that has tested positive in the last 10 days please notify you surgeon.    Your procedure is scheduled on:  FRIDAY  March 24, 2024  Report to [facility] Main Entrance: [address] entrance where the [employer] is available.   Report to admitting at:  11:30   AM  Call this number if you have any questions or problems the morning of surgery [phone]  Do not eat food after Midnight the night prior to your surgery/procedure.  After Midnight you may have the following liquids until  11:00 AM DAY OF SURGERY  Clear Liquid Diet Water Black Coffee (sugar ok, NO MILK/CREAM OR CREAMERS)  Tea (sugar ok, NO MILK/CREAM OR CREAMERS) regular and decaf                             Plain Jell-O  with no fruit (NO RED)                                           Fruit ices  (not with fruit pulp, NO RED)                                     Popsicles (NO RED)                                                                  Juice: NO CITRUS JUICES: only apple, WHITE grape, WHITE cranberry Sports drinks like Gatorade or Powerade (NO RED)                   The day of surgery:  Drink ONE (1) Pre-Surgery G2 at 11:00  AM the morning of surgery. Drink in one sitting. Do not sip.  This drink was given to you during your hospital pre-op appointment visit. Nothing else to drink after completing the  Pre-Surgery G2 : No candy, chewing gum or throat lozenges.    FOLLOW ANY ADDITIONAL PRE OP INSTRUCTIONS YOU RECEIVED FROM YOUR SURGEON'S OFFICE!!!   Oral Hygiene is also important to reduce your risk of infection.        Remember - BRUSH YOUR TEETH THE MORNING OF SURGERY WITH YOUR REGULAR TOOTHPASTE  Do NOT smoke after Midnight the night before surgery.  STOP TAKING all Vitamins, Herbs and supplements 1 week before your surgery.   Take ONLY these medicines the morning of surgery with A SIP OF WATER: Metoprolol, Tamsulosin  ?ran out,  Solifenacin (Vesicare) ?takes pm.                   You may not have any metal on your body including  jewelry, and body piercing  Do not wear lotions, powders, cologne, or deodorant  Men may shave face and neck.  Contacts, Hearing Aids, dentures or bridgework may not be worn into surgery. DENTURES WILL BE REMOVED PRIOR TO SURGERY PLEASE DO NOT APPLY "Poly grip" OR ADHESIVES!!!  Patients discharged on the day of surgery will not be allowed to drive home.  Someone NEEDS to stay with you for the first 24 hours after anesthesia.  Do not bring your home medications to the hospital. The Pharmacy will dispense medications listed on your medication list to you during your admission in the Hospital.  Please read over the following fact sheets you were given: IF YOU HAVE QUESTIONS ABOUT YOUR PRE-OP INSTRUCTIONS, PLEASE CALL  [phone].     Pre-operative 5 CHG Bath Instructions   You can play a key role in reducing the risk of infection after surgery. Your skin needs to be as free of germs as possible. You can reduce the number of germs on your skin by washing with CHG (chlorhexidine gluconate) soap before surgery. CHG is an antiseptic soap that kills germs and continues to kill germs even after washing.   DO NOT use if you have an allergy to chlorhexidine/CHG or antibacterial soaps. If your skin becomes reddened or irritated, stop using the CHG and notify one of our RNs at [phone]  Please shower with the CHG soap starting 4 days before surgery using the following schedule: START SHOWERS ON   [facility]  March 20, 2024                                                                                                                                                                              Please keep in mind the following:  DO NOT shave, including legs and underarms, starting the day of your first shower.   You may shave your face at any point before/day of  surgery.   Place clean sheets on your bed the day you start using CHG soap. Use a clean washcloth (not used since being washed) for each shower. DO NOT sleep with pets once you start using the CHG.   CHG Shower Instructions:  If you choose to wash your hair and private area, wash first with your normal shampoo/soap.  After you use shampoo/soap, rinse your hair and body thoroughly to remove shampoo/soap residue.  Turn the water OFF and apply about 3 tablespoons (45 ml) of CHG soap to a CLEAN washcloth.  Apply CHG soap ONLY FROM YOUR NECK DOWN TO YOUR TOES (washing for 3-5 minutes)  DO NOT use CHG soap on face, private areas, open wounds, or sores.  Pay special attention to the area where your surgery is being performed.  If you are having back surgery, having someone wash your back for you may be helpful.  Wait 2 minutes after CHG soap is applied,  then you may rinse off the CHG soap.  Pat dry with a clean towel  Put on clean clothes/pajamas   If you choose to wear lotion, please use ONLY the CHG-compatible lotions on the back of this paper.     Additional instructions for the day of surgery: DO NOT APPLY any lotions, deodorants, cologne, or perfumes.   Put on clean/comfortable clothes.  Brush your teeth.  Ask your nurse before applying any prescription medications to the skin.      CHG Compatible Lotions   Aveeno Moisturizing lotion  Cetaphil Moisturizing Cream  Cetaphil Moisturizing Lotion  Clairol Herbal Essence Moisturizing Lotion, Dry Skin  Clairol Herbal Essence Moisturizing Lotion, Extra Dry Skin  Clairol Herbal Essence Moisturizing Lotion, Normal Skin  Curel Age Defying Therapeutic Moisturizing Lotion with Alpha Hydroxy  Curel Extreme Care Body Lotion  Curel Soothing Hands Moisturizing Hand Lotion  Curel Therapeutic Moisturizing Cream, Fragrance-Free  Curel Therapeutic Moisturizing Lotion, Fragrance-Free  Curel Therapeutic Moisturizing Lotion, Original Formula  Eucerin Daily Replenishing Lotion  Eucerin Dry Skin Therapy Plus Alpha Hydroxy Crme  Eucerin Dry Skin Therapy Plus Alpha Hydroxy Lotion  Eucerin Original Crme  Eucerin Original Lotion  Eucerin Plus Crme Eucerin Plus Lotion  Eucerin TriLipid Replenishing Lotion  Keri Anti-Bacterial Hand Lotion  Keri Deep Conditioning Original Lotion Dry Skin Formula Softly Scented  Keri Deep Conditioning Original Lotion, Fragrance Free Sensitive Skin Formula  Keri Lotion Fast Absorbing Fragrance Free Sensitive Skin Formula  Keri Lotion Fast Absorbing Softly Scented Dry Skin Formula  Keri Original Lotion  Keri Skin Renewal Lotion Keri Silky Smooth Lotion  Keri Silky Smooth Sensitive Skin Lotion  Nivea Body Creamy Conditioning Oil  Nivea Body Extra Enriched Lotion  Nivea Body Original Lotion  Nivea Body Sheer Moisturizing Lotion Nivea Crme  Nivea Skin Firming  Lotion  NutraDerm 30 Skin Lotion  NutraDerm Skin Lotion  NutraDerm Therapeutic Skin Cream  NutraDerm Therapeutic Skin Lotion  ProShield Protective Hand Cream  Provon moisturizing lotion   FAILURE TO FOLLOW THESE INSTRUCTIONS MAY RESULT IN THE CANCELLATION OF YOUR SURGERY  PATIENT SIGNATURE_________________________________  NURSE SIGNATURE__________________________________  ________________________________________________________________________       Richard Harding    An incentive spirometer is a tool that can help keep your lungs clear and active. This tool measures how well you are filling your lungs with each breath. Taking long deep breaths may help reverse or decrease the chance of developing breathing (pulmonary) problems (especially infection) following: A long period of time when you are unable to move or be active. BEFORE  THE PROCEDURE  If the spirometer includes an indicator to show your best effort, your nurse or respiratory therapist will set it to a desired goal. If possible, sit up straight or lean slightly forward. Try not to slouch. Hold the incentive spirometer in an upright position. INSTRUCTIONS FOR USE  Sit on the edge of your bed if possible, or sit up as far as you can in bed or on a chair. Hold the incentive spirometer in an upright position. Breathe out normally. Place the mouthpiece in your mouth and seal your lips tightly around it. Breathe in slowly and as deeply as possible, raising the piston or the ball toward the top of the column. Hold your breath for 3-5 seconds or for as long as possible. Allow the piston or ball to fall to the bottom of the column. Remove the mouthpiece from your mouth and breathe out normally. Rest for a few seconds and repeat Steps 1 through 7 at least 10 times every 1-2 hours when you are awake. Take your time and take a few normal breaths between deep breaths. The spirometer may include an indicator to show your  best effort. Use the indicator as a goal to work toward during each repetition. After each set of 10 deep breaths, practice coughing to be sure your lungs are clear. If you have an incision (the cut made at the time of surgery), support your incision when coughing by placing a pillow or rolled up towels firmly against it. Once you are able to get out of bed, walk around indoors and cough well. You may stop using the incentive spirometer when instructed by your caregiver.  RISKS AND COMPLICATIONS Take your time so you do not get dizzy or light-headed. If you are in pain, you may need to take or ask for pain medication before doing incentive spirometry. It is harder to take a deep breath if you are having pain. AFTER USE Rest and breathe slowly and easily. It can be helpful to keep track of a log of your progress. Your caregiver can provide you with a simple table to help with this. If you are using the spirometer at home, follow these instructions: SEEK MEDICAL CARE IF:  You are having difficultly using the spirometer. You have trouble using the spirometer as often as instructed. Your pain medication is not giving enough relief while using the spirometer. You develop fever of 100.5 F (38.1 C) or higher.                                                                                                    SEEK IMMEDIATE MEDICAL CARE IF:  You cough up bloody sputum that had not been present before. You develop fever of 102 F (38.9 C) or greater. You develop worsening pain at or near the incision site. MAKE SURE YOU:  Understand these instructions. Will watch your condition. Will get help right away if you are not doing well or get worse. Document Released: 04/19/2007 Document Revised: 02/29/2012 Document Reviewed: 06/20/2007 [facility] Patient Information 2014 [place], [place].  WHAT IS A BLOOD TRANSFUSION? Blood Transfusion Information  A transfusion is the replacement of blood  or some of its parts. Blood is made up of multiple cells which provide different functions. Red blood cells carry oxygen and are used for blood loss replacement. White blood cells fight against infection. Platelets control bleeding. Plasma helps clot blood. Other blood products are available for specialized needs, such as hemophilia or other clotting disorders. BEFORE THE TRANSFUSION  Who gives blood for transfusions?  Healthy volunteers who are fully evaluated to make sure their blood is safe. This is blood bank blood. Transfusion therapy is the safest it has ever been in the practice of medicine. Before blood is taken from a donor, a complete history is taken to make sure that person has no history of diseases nor engages in risky social behavior (examples are intravenous drug use or sexual activity with multiple partners). The donor's travel history is screened to minimize risk of transmitting infections, such as malaria. The donated blood is tested for signs of infectious diseases, such as HIV and hepatitis. The blood is then tested to be sure it is compatible with you in order to minimize the chance of a transfusion reaction. If you or a relative donates blood, this is often done in anticipation of surgery and is not appropriate for emergency situations. It takes many days to process the donated blood. RISKS AND COMPLICATIONS Although transfusion therapy is very safe and saves many lives, the main dangers of transfusion include:  Getting an infectious disease. Developing a transfusion reaction. This is an allergic reaction to something in the blood you were given. Every precaution is taken to prevent this. The decision to have a blood transfusion has been considered carefully by your caregiver before blood is given. Blood is not given unless the benefits outweigh the risks. AFTER THE TRANSFUSION Right after receiving a blood transfusion, you will usually feel much better and more energetic. This  is especially true if your red blood cells have gotten low (anemic). The transfusion raises the level of the red blood cells which carry oxygen, and this usually causes an energy increase. The nurse administering the transfusion will monitor you carefully for complications. HOME CARE INSTRUCTIONS  No special instructions are needed after a transfusion. You may find your energy is better. Speak with your caregiver about any limitations on activity for underlying diseases you may have. SEEK MEDICAL CARE IF:  Your condition is not improving after your transfusion. You develop redness or irritation at the intravenous (IV) site. SEEK IMMEDIATE MEDICAL CARE IF:  Any of the following symptoms occur over the next 12 hours: Shaking chills. You have a temperature by mouth above 102 F (38.9 C), not controlled by medicine. Chest, back, or muscle pain. People around you feel you are not acting correctly or are confused. Shortness of breath or difficulty breathing. Dizziness and fainting. You get a rash or develop hives. You have a decrease in urine output. Your urine turns a dark color or changes to pink, red, or brown. Any of the following symptoms occur over the next 10 days: You have a temperature by mouth above 102 F (38.9 C), not controlled by medicine. Shortness of breath. Weakness after normal activity. The white part of the eye turns yellow (jaundice). You have a decrease in the amount of urine or are urinating less often. Your urine turns a dark color or changes to pink, red, or brown. Document Released: 12/04/2000 Document Revised: 02/29/2012 Document Reviewed: 07/23/2008 [employer]  Patient Information 2014 [employer], [place].  _______________________________________________________________________         If you would like to see a video about joint replacement:   [email or web address]             WHAT IS A  BLOOD TRANSFUSION? Blood Transfusion Information  A transfusion is the replacement of blood or some of its parts. Blood is made up of multiple cells which provide different functions. Red blood cells carry oxygen and are used for blood loss replacement. White blood cells fight against infection. Platelets control bleeding. Plasma helps clot blood. Other blood products are available for specialized needs, such as hemophilia or other clotting disorders. BEFORE THE TRANSFUSION  Who gives blood for transfusions?  Healthy volunteers who are fully evaluated to make sure their blood is safe. This is blood bank blood. Transfusion therapy is the safest it has ever been in the practice of medicine. Before blood is taken from a donor, a complete history is taken to make sure that person has no history of diseases nor engages in risky social behavior (examples are intravenous drug use or sexual activity with multiple partners). The donor's travel history is screened to minimize risk of transmitting infections, such as malaria. The donated blood is tested for signs of infectious diseases, such as HIV and hepatitis. The blood is then tested to be sure it is compatible with you in order to minimize the chance of a transfusion reaction. If you or a relative donates blood, this is often done in anticipation of surgery and is not appropriate for emergency situations. It takes many days to process the donated blood. RISKS AND COMPLICATIONS Although transfusion therapy is very safe and saves many lives, the main dangers of transfusion include:  Getting an infectious disease. Developing a transfusion reaction. This is an allergic reaction to something in the blood you were given. Every precaution is taken to prevent this. The decision to have a blood transfusion has been considered carefully by your caregiver before blood is given. Blood is not given unless the benefits outweigh the risks. AFTER THE TRANSFUSION Right  after receiving a blood transfusion, you will usually feel much better and more energetic. This is especially true if your red blood cells have gotten low (anemic). The transfusion raises the level of the red blood cells which carry oxygen, and this usually causes an energy increase. The nurse administering the transfusion will monitor you carefully for complications. HOME CARE INSTRUCTIONS  No special instructions are needed after a transfusion. You may find your energy is better. Speak with your caregiver about any limitations on activity for underlying diseases you may have. SEEK MEDICAL CARE IF:  Your condition is not improving after your transfusion. You develop redness or irritation at the intravenous (IV) site. SEEK IMMEDIATE MEDICAL CARE IF:  Any of the following symptoms occur over the next 12 hours: Shaking chills. You have a temperature by mouth above 102 F (38.9 C), not controlled by medicine. Chest, back, or muscle pain. People around you feel you are not acting correctly or are confused. Shortness of breath or difficulty breathing. Dizziness and fainting. You get a rash or develop hives. You have a decrease in urine output. Your urine turns a dark color or changes to pink, red, or brown. Any of the following symptoms occur over the next 10 days: You have a temperature by mouth above 102 F (38.9 C), not controlled by medicine. Shortness of breath. Weakness after normal activity. The white part of the  eye turns yellow (jaundice). You have a decrease in the amount of urine or are urinating less often. Your urine turns a dark color or changes to pink, red, or brown. Document Released: 12/04/2000 Document Revised: 02/29/2012 Document Reviewed: 07/23/2008 [facility] Patient Information 2014 [place], [place].  _______________________________________________________________________

## 2024-03-20 ENCOUNTER — Encounter (HOSPITAL_COMMUNITY): Payer: Self-pay

## 2024-03-20 ENCOUNTER — Other Ambulatory Visit: Payer: Self-pay

## 2024-03-20 ENCOUNTER — Encounter (HOSPITAL_COMMUNITY)
Admission: RE | Admit: 2024-03-20 | Discharge: 2024-03-20 | Disposition: A | Discharge status: Home / Self Care | Source: Ambulatory Visit | Attending: Orthopaedic Surgery | Admitting: Orthopaedic Surgery

## 2024-03-20 VITALS — BP 106/58 | HR 64 | Temp 98.9°F | Resp 16 | Ht 69.0 in | Wt 189.0 lb

## 2024-03-20 DIAGNOSIS — E1151 Type 2 diabetes mellitus with diabetic peripheral angiopathy without gangrene: Secondary | ICD-10-CM | POA: Insufficient documentation

## 2024-03-20 DIAGNOSIS — I10 Essential (primary) hypertension: Secondary | ICD-10-CM | POA: Diagnosis not present

## 2024-03-20 DIAGNOSIS — Z01818 Encounter for other preprocedural examination: Secondary | ICD-10-CM

## 2024-03-20 DIAGNOSIS — Z923 Personal history of irradiation: Secondary | ICD-10-CM | POA: Diagnosis not present

## 2024-03-20 DIAGNOSIS — Z01812 Encounter for preprocedural laboratory examination: Secondary | ICD-10-CM | POA: Insufficient documentation

## 2024-03-20 HISTORY — DX: Respiratory tuberculosis unspecified: A15.9

## 2024-03-20 LAB — BASIC METABOLIC PANEL WITH GFR
Anion gap: 11 (ref 5–15)
BUN: 21 mg/dL (ref 8–23)
CO2: 25 mmol/L (ref 22–32)
Calcium: 9.4 mg/dL (ref 8.9–10.3)
Chloride: 100 mmol/L (ref 98–111)
Creatinine, Ser: 1.04 mg/dL (ref 0.61–1.24)
GFR, Estimated: >60 mL/min (ref >60)
Glucose, Bld: 202 mg/dL — ABNORMAL HIGH (ref 70–99)
Potassium: 3.4 mmol/L — ABNORMAL LOW (ref 3.5–5.1)
Sodium: 136 mmol/L (ref 135–145)

## 2024-03-20 LAB — CBC
HCT: 29.5 % — ABNORMAL LOW (ref 39.0–52.0)
Hemoglobin: 9.8 g/dL — ABNORMAL LOW (ref 13.0–17.0)
MCH: 29 pg (ref 26.0–34.0)
MCHC: 33.2 g/dL (ref 30.0–36.0)
MCV: 87.3 fL (ref 80.0–100.0)
Platelets: 403 10*3/uL — ABNORMAL HIGH (ref 150–400)
RBC: 3.38 MIL/uL — ABNORMAL LOW (ref 4.22–5.81)
RDW: 13 % (ref 11.5–15.5)
WBC: 7.1 10*3/uL (ref 4.0–10.5)
nRBC: 0 % (ref 0.0–0.2)

## 2024-03-20 LAB — SURGICAL PCR SCREEN
MRSA, PCR: NEGATIVE
Staphylococcus aureus: NEGATIVE

## 2024-03-20 LAB — GLUCOSE, CAPILLARY: Glucose-Capillary: 183 mg/dL — ABNORMAL HIGH (ref 70–99)

## 2024-03-23 ENCOUNTER — Ambulatory Visit: Payer: Medicare Other | Admitting: Nurse Practitioner

## 2024-03-23 NOTE — H&P (Signed)
 Richard Harding is an 78 y.o. male.   Chief Complaint: left knee swelling with possible infection HPI: The patient is a 78 year old well-known to [place].  We have replaced both of his knees with the right knee replaced in 2021 and the left knee replaced in 2023.  He has had some medical issues with prostate cancer that has been recognized over the last several months.  He then developed a urinary tract infection.  He presented to the office in March with acute swelling of his left knee.  We aspirated fluid of the knee which was concerning for infection.  The Gram stain and cultures were negative but the white blood cell count was 49,000.  He is CRP and sed rate are also slightly elevated.  There is enough concern about infection of his left total knee arthroplasty that we have recommended surgical intervention with one possibility being an open synovectomy and poly liner exchange with also the possibility of removing all implants and placing a temporary antibiotic spacer.  He understands this fully.  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   Type 2 diabetes mellitus (HCC)    followed by pcp   (11-10-2023  pt stated does not check blood sugar)   Wears glasses     Past Surgical History:  Procedure Laterality Date   ANTERIOR FUSION CERVICAL SPINE  1990   Thinks it was C-3-4  done here at [facility]   BILATERAL CARPAL TUNNEL RELEASE Bilateral    1990s   COLONOSCOPY  2011   GOLD SEED IMPLANT N/A 11/23/2023   Procedure: GOLD SEED IMPLANT;  Surgeon: Milderd Meager., MD;   Location: [facility];  Service: Urology;  Laterality: N/A;   SPACE OAR INSTILLATION N/A 11/23/2023   Procedure: SPACE OAR INSTILLATION;  Surgeon: Milderd Meager., MD;  Location: [facility];  Service: Urology;  Laterality: N/A;   TOTAL KNEE ARTHROPLASTY Right 04/26/2020   Procedure: RIGHT TOTAL KNEE ARTHROPLASTY;  Surgeon: Kathryne Hitch, MD;  Location: [facility];  Service: Orthopedics;  Laterality: Right;   TOTAL KNEE ARTHROPLASTY Left 09/25/2022   Procedure: LEFT TOTAL KNEE ARTHROPLASTY;  Surgeon: Kathryne Hitch, MD;  Location: [facility];  Service: Orthopedics;  Laterality: Left;    No family history on file. Social History:  reports that he has never smoked. He has never used smokeless tobacco. He reports that he does not drink alcohol and does not use drugs.  Allergies:  Allergies  Allergen Reactions   Cinnamon Flavoring Agent (Non-Screening) Other (See Comments)    Cinnamon chewing gum caused blisters on tongue    No medications prior to admission.    No results found for this or any previous visit (from the past 48 hours). No results found.  Review of Systems  There were no vitals taken for this visit. Physical Exam Vitals reviewed.  Constitutional:      Appearance: Normal appearance. He is normal weight.  HENT:     Head: Normocephalic and atraumatic.  Eyes:     Extraocular Movements: Extraocular movements intact.     Pupils: Pupils are equal, round, and reactive to light.  Cardiovascular:     Rate and Rhythm: Normal rate.  Pulmonary:     Effort: Pulmonary effort is normal.  Abdominal:     Palpations: Abdomen is soft.  Musculoskeletal:     Cervical back: Normal range of motion and neck supple.     Left knee: Effusion present.  Neurological:     Mental Status: He is alert and oriented to person, place, and time.  Psychiatric:        Behavior: Behavior normal.      Assessment/Plan Effusion with synovitis left  total knee arthroplasty concerning for infection  Again, the plan is to take the patient to surgery for an open synovectomy with an incision and drainage/irrigation debridement.  Depending on the intraoperative findings there is a possibility for excising/removing all implants and placing a temporary antibiotic spacer.  The patient understands this fully.  Kathryne Hitch, MD 03/23/2024, 10:40 AM

## 2024-03-24 ENCOUNTER — Inpatient Hospital Stay (HOSPITAL_COMMUNITY): Payer: Self-pay | Admitting: Physician Assistant

## 2024-03-24 ENCOUNTER — Encounter (HOSPITAL_COMMUNITY): Payer: Self-pay | Admitting: Orthopaedic Surgery

## 2024-03-24 ENCOUNTER — Other Ambulatory Visit: Payer: Self-pay

## 2024-03-24 ENCOUNTER — Encounter (HOSPITAL_COMMUNITY)
Admission: RE | Disposition: A | Discharge status: Skilled Nursing Facility | Payer: Self-pay | Source: Home / Self Care | Attending: Orthopaedic Surgery

## 2024-03-24 ENCOUNTER — Inpatient Hospital Stay (HOSPITAL_COMMUNITY)
Admission: RE | Admit: 2024-03-24 | Discharge: 2024-03-29 | DRG: 487 | Disposition: A | Discharge status: Skilled Nursing Facility | Attending: Orthopaedic Surgery | Admitting: Orthopaedic Surgery

## 2024-03-24 ENCOUNTER — Inpatient Hospital Stay (HOSPITAL_COMMUNITY): Admitting: Registered Nurse

## 2024-03-24 DIAGNOSIS — I10 Essential (primary) hypertension: Secondary | ICD-10-CM | POA: Diagnosis not present

## 2024-03-24 DIAGNOSIS — Z9109 Other allergy status, other than to drugs and biological substances: Secondary | ICD-10-CM | POA: Diagnosis not present

## 2024-03-24 DIAGNOSIS — G8918 Other acute postprocedural pain: Secondary | ICD-10-CM | POA: Diagnosis not present

## 2024-03-24 DIAGNOSIS — Z604 Social exclusion and rejection: Secondary | ICD-10-CM | POA: Diagnosis present

## 2024-03-24 DIAGNOSIS — E782 Mixed hyperlipidemia: Secondary | ICD-10-CM | POA: Diagnosis not present

## 2024-03-24 DIAGNOSIS — Z96652 Presence of left artificial knee joint: Secondary | ICD-10-CM | POA: Diagnosis not present

## 2024-03-24 DIAGNOSIS — B957 Other staphylococcus as the cause of diseases classified elsewhere: Secondary | ICD-10-CM | POA: Diagnosis not present

## 2024-03-24 DIAGNOSIS — M25462 Effusion, left knee: Secondary | ICD-10-CM | POA: Diagnosis present

## 2024-03-24 DIAGNOSIS — Z7984 Long term (current) use of oral hypoglycemic drugs: Secondary | ICD-10-CM | POA: Diagnosis not present

## 2024-03-24 DIAGNOSIS — Z8546 Personal history of malignant neoplasm of prostate: Secondary | ICD-10-CM | POA: Diagnosis not present

## 2024-03-24 DIAGNOSIS — T8454XA Infection and inflammatory reaction due to internal left knee prosthesis, initial encounter: Principal | ICD-10-CM | POA: Diagnosis present

## 2024-03-24 DIAGNOSIS — Z5941 Food insecurity: Secondary | ICD-10-CM

## 2024-03-24 DIAGNOSIS — Z01818 Encounter for other preprocedural examination: Secondary | ICD-10-CM

## 2024-03-24 DIAGNOSIS — M65962 Unspecified synovitis and tenosynovitis, left lower leg: Principal | ICD-10-CM | POA: Diagnosis present

## 2024-03-24 DIAGNOSIS — M170 Bilateral primary osteoarthritis of knee: Secondary | ICD-10-CM | POA: Diagnosis not present

## 2024-03-24 DIAGNOSIS — M6281 Muscle weakness (generalized): Secondary | ICD-10-CM | POA: Diagnosis not present

## 2024-03-24 DIAGNOSIS — R0989 Other specified symptoms and signs involving the circulatory and respiratory systems: Secondary | ICD-10-CM | POA: Diagnosis not present

## 2024-03-24 DIAGNOSIS — Z96653 Presence of artificial knee joint, bilateral: Secondary | ICD-10-CM | POA: Diagnosis present

## 2024-03-24 DIAGNOSIS — N401 Enlarged prostate with lower urinary tract symptoms: Secondary | ICD-10-CM | POA: Diagnosis present

## 2024-03-24 DIAGNOSIS — E785 Hyperlipidemia, unspecified: Secondary | ICD-10-CM

## 2024-03-24 DIAGNOSIS — M65162 Other infective (teno)synovitis, left knee: Secondary | ICD-10-CM | POA: Diagnosis not present

## 2024-03-24 DIAGNOSIS — R2689 Other abnormalities of gait and mobility: Secondary | ICD-10-CM | POA: Diagnosis not present

## 2024-03-24 DIAGNOSIS — E1151 Type 2 diabetes mellitus with diabetic peripheral angiopathy without gangrene: Secondary | ICD-10-CM | POA: Diagnosis not present

## 2024-03-24 DIAGNOSIS — Z87440 Personal history of urinary (tract) infections: Secondary | ICD-10-CM

## 2024-03-24 DIAGNOSIS — Z923 Personal history of irradiation: Secondary | ICD-10-CM

## 2024-03-24 DIAGNOSIS — T8454XD Infection and inflammatory reaction due to internal left knee prosthesis, subsequent encounter: Secondary | ICD-10-CM | POA: Diagnosis not present

## 2024-03-24 HISTORY — PX: EXCISIONAL TOTAL KNEE ARTHROPLASTY WITH ANTIBIOTIC SPACERS: SHX5827

## 2024-03-24 HISTORY — DX: Infection and inflammatory reaction due to internal left knee prosthesis, initial encounter: T84.54XA

## 2024-03-24 LAB — TYPE AND SCREEN
ABO/RH(D): O NEG
Antibody Screen: NEGATIVE

## 2024-03-24 LAB — GLUCOSE, CAPILLARY
Glucose-Capillary: 123 mg/dL — ABNORMAL HIGH (ref 70–99)
Glucose-Capillary: 87 mg/dL (ref 70–99)
Glucose-Capillary: 134 mg/dL — ABNORMAL HIGH (ref 70–99)

## 2024-03-24 SURGERY — REMOVAL, TOTAL ARTHROPLASTY HARDWARE, KNEE, WITH ANTIBIOTIC SPACER INSERTION
Anesthesia: General | Site: Knee | Laterality: Left

## 2024-03-24 MED ORDER — OXYCODONE HCL 5 MG PO TABS
10.0000 mg | ORAL_TABLET | ORAL | Status: DC | PRN
Start: 2024-03-24 — End: 2024-03-29
  Administered 2024-03-24 – 2024-03-26 (×5): 10 mg via ORAL
  Filled 2024-03-24 (×3): qty 2
  Filled 2024-03-24: qty 3
  Filled 2024-03-24: qty 2

## 2024-03-24 MED ORDER — MIDAZOLAM HCL 2 MG/2ML IJ SOLN: 1.0000 mg | INTRAMUSCULAR | Status: DC

## 2024-03-24 MED ORDER — CHLORTHALIDONE 25 MG PO TABS
25.0000 mg | ORAL_TABLET | Freq: Every day | ORAL | Status: DC
  Administered 2024-03-24 – 2024-03-29 (×6): 25 mg via ORAL
  Filled 2024-03-24 (×6): qty 1

## 2024-03-24 MED ORDER — DEXAMETHASONE SODIUM PHOSPHATE 10 MG/ML IJ SOLN
INTRAMUSCULAR | Status: DC | PRN
  Administered 2024-03-24: 10 mg via INTRAVENOUS

## 2024-03-24 MED ORDER — MEPERIDINE HCL 50 MG/ML IJ SOLN: 6.2500 mg | INTRAMUSCULAR | Status: DC | PRN

## 2024-03-24 MED ORDER — SODIUM CHLORIDE 0.9 % IV SOLN: INTRAVENOUS | Status: AC

## 2024-03-24 MED ORDER — ONDANSETRON HCL 4 MG PO TABS: 4.0000 mg | ORAL_TABLET | Freq: Four times a day (QID) | ORAL | Status: DC | PRN

## 2024-03-24 MED ORDER — VANCOMYCIN HCL 1000 MG IV SOLR
INTRAVENOUS | Status: AC
  Filled 2024-03-24: qty 60

## 2024-03-24 MED ORDER — PHENYLEPHRINE 80 MCG/ML (10ML) SYRINGE FOR IV PUSH (FOR BLOOD PRESSURE SUPPORT)
PREFILLED_SYRINGE | INTRAVENOUS | Status: DC | PRN
  Administered 2024-03-24: 160 ug via INTRAVENOUS
  Administered 2024-03-24 (×2): 80 ug via INTRAVENOUS
  Administered 2024-03-24: 160 ug via INTRAVENOUS

## 2024-03-24 MED ORDER — ONDANSETRON HCL 4 MG/2ML IJ SOLN
INTRAMUSCULAR | Status: AC
  Filled 2024-03-24: qty 2

## 2024-03-24 MED ORDER — LACTATED RINGERS IV SOLN: INTRAVENOUS | Status: DC

## 2024-03-24 MED ORDER — ONDANSETRON HCL 4 MG/2ML IJ SOLN
INTRAMUSCULAR | Status: DC | PRN
  Administered 2024-03-24: 4 mg via INTRAVENOUS

## 2024-03-24 MED ORDER — ALUM & MAG HYDROXIDE-SIMETH 200-200-20 MG/5ML PO SUSP: 30.0000 mL | ORAL | Status: DC | PRN

## 2024-03-24 MED ORDER — PHENYLEPHRINE 80 MCG/ML (10ML) SYRINGE FOR IV PUSH (FOR BLOOD PRESSURE SUPPORT)
PREFILLED_SYRINGE | INTRAVENOUS | Status: AC
  Filled 2024-03-24: qty 10

## 2024-03-24 MED ORDER — SUGAMMADEX SODIUM 200 MG/2ML IV SOLN
INTRAVENOUS | Status: AC
  Filled 2024-03-24: qty 2

## 2024-03-24 MED ORDER — PHENYLEPHRINE HCL-NACL 20-0.9 MG/250ML-% IV SOLN
INTRAVENOUS | Status: DC | PRN
  Administered 2024-03-24: 50 ug/min via INTRAVENOUS

## 2024-03-24 MED ORDER — VANCOMYCIN HCL 1000 MG IV SOLR
INTRAVENOUS | Status: DC | PRN
  Administered 2024-03-24: 1000 mg via TOPICAL

## 2024-03-24 MED ORDER — ROCURONIUM BROMIDE 10 MG/ML (PF) SYRINGE
PREFILLED_SYRINGE | INTRAVENOUS | Status: DC | PRN
  Administered 2024-03-24: 50 mg via INTRAVENOUS

## 2024-03-24 MED ORDER — ACETAMINOPHEN 325 MG PO TABS
325.0000 mg | ORAL_TABLET | Freq: Four times a day (QID) | ORAL | Status: DC | PRN
  Administered 2024-03-26 – 2024-03-29 (×5): 650 mg via ORAL
  Filled 2024-03-24 (×5): qty 2

## 2024-03-24 MED ORDER — TAMSULOSIN HCL 0.4 MG PO CAPS
0.4000 mg | ORAL_CAPSULE | Freq: Every day | ORAL | Status: DC
Start: 2024-03-24 — End: 2024-03-29
  Administered 2024-03-24 – 2024-03-29 (×6): 0.4 mg via ORAL
  Filled 2024-03-24 (×6): qty 1

## 2024-03-24 MED ORDER — METOCLOPRAMIDE HCL 5 MG PO TABS: 5.0000 mg | ORAL_TABLET | Freq: Three times a day (TID) | ORAL | Status: DC | PRN

## 2024-03-24 MED ORDER — SODIUM CHLORIDE 0.9 % IR SOLN
Status: DC | PRN
  Administered 2024-03-24: 3000 mL

## 2024-03-24 MED ORDER — METHOCARBAMOL 1000 MG/10ML IJ SOLN
500.0000 mg | Freq: Four times a day (QID) | INTRAMUSCULAR | Status: DC | PRN
  Administered 2024-03-24: 500 mg via INTRAVENOUS

## 2024-03-24 MED ORDER — STERILE WATER FOR IRRIGATION IR SOLN
Status: DC | PRN
  Administered 2024-03-24: 1000 mL

## 2024-03-24 MED ORDER — VITAMIN C 500 MG PO TABS
1000.0000 mg | ORAL_TABLET | Freq: Two times a day (BID) | ORAL | Status: DC
  Administered 2024-03-24 – 2024-03-29 (×10): 1000 mg via ORAL
  Filled 2024-03-24 (×10): qty 2

## 2024-03-24 MED ORDER — VANCOMYCIN HCL IN DEXTROSE 1-5 GM/200ML-% IV SOLN
INTRAVENOUS | Status: AC
  Filled 2024-03-24: qty 200

## 2024-03-24 MED ORDER — LORATADINE 10 MG PO TABS
10.0000 mg | ORAL_TABLET | Freq: Every day | ORAL | Status: DC
  Administered 2024-03-25 – 2024-03-29 (×5): 10 mg via ORAL
  Filled 2024-03-24 (×5): qty 1

## 2024-03-24 MED ORDER — ONDANSETRON HCL 4 MG/2ML IJ SOLN
4.0000 mg | Freq: Four times a day (QID) | INTRAMUSCULAR | Status: DC | PRN
  Administered 2024-03-28: 4 mg via INTRAVENOUS
  Filled 2024-03-24: qty 2

## 2024-03-24 MED ORDER — METOPROLOL TARTRATE 50 MG PO TABS
100.0000 mg | ORAL_TABLET | Freq: Two times a day (BID) | ORAL | Status: DC
  Administered 2024-03-24 – 2024-03-29 (×10): 100 mg via ORAL
  Filled 2024-03-24 (×10): qty 2

## 2024-03-24 MED ORDER — OXYCODONE HCL 5 MG PO TABS
5.0000 mg | ORAL_TABLET | ORAL | Status: DC | PRN
  Administered 2024-03-24 – 2024-03-26 (×4): 10 mg via ORAL
  Administered 2024-03-27: 5 mg via ORAL
  Administered 2024-03-27: 10 mg via ORAL
  Administered 2024-03-27: 5 mg via ORAL
  Administered 2024-03-27 – 2024-03-29 (×7): 10 mg via ORAL
  Administered 2024-03-29: 5 mg via ORAL
  Filled 2024-03-24 (×16): qty 2

## 2024-03-24 MED ORDER — FENTANYL CITRATE (PF) 100 MCG/2ML IJ SOLN
INTRAMUSCULAR | Status: AC
  Filled 2024-03-24: qty 2

## 2024-03-24 MED ORDER — ATORVASTATIN CALCIUM 10 MG PO TABS
10.0000 mg | ORAL_TABLET | Freq: Every day | ORAL | Status: DC
  Administered 2024-03-24 – 2024-03-28 (×5): 10 mg via ORAL
  Filled 2024-03-24 (×5): qty 1

## 2024-03-24 MED ORDER — PANTOPRAZOLE SODIUM 40 MG PO TBEC
40.0000 mg | DELAYED_RELEASE_TABLET | Freq: Every day | ORAL | Status: DC
  Administered 2024-03-24 – 2024-03-29 (×6): 40 mg via ORAL
  Filled 2024-03-24 (×6): qty 1

## 2024-03-24 MED ORDER — INSULIN ASPART 100 UNIT/ML IJ SOLN: 0.0000 [IU] | INTRAMUSCULAR | Status: DC | PRN

## 2024-03-24 MED ORDER — LIDOCAINE 2% (20 MG/ML) 5 ML SYRINGE
INTRAMUSCULAR | Status: DC | PRN
  Administered 2024-03-24: 40 mg via INTRAVENOUS

## 2024-03-24 MED ORDER — TRANEXAMIC ACID-NACL 1000-0.7 MG/100ML-% IV SOLN
1000.0000 mg | INTRAVENOUS | Status: AC
  Administered 2024-03-24: 1000 mg via INTRAVENOUS
  Filled 2024-03-24: qty 100

## 2024-03-24 MED ORDER — FENOFIBRATE 54 MG PO TABS
54.0000 mg | ORAL_TABLET | Freq: Every day | ORAL | Status: DC
  Administered 2024-03-25 – 2024-03-29 (×5): 54 mg via ORAL
  Filled 2024-03-24 (×5): qty 1

## 2024-03-24 MED ORDER — PHENOL 1.4 % MT LIQD: 1.0000 | OROMUCOSAL | Status: DC | PRN

## 2024-03-24 MED ORDER — ACETAMINOPHEN 10 MG/ML IV SOLN: 1000.0000 mg | Freq: Once | INTRAVENOUS | Status: DC | PRN

## 2024-03-24 MED ORDER — DIPHENHYDRAMINE HCL 12.5 MG/5ML PO ELIX: 12.5000 mg | ORAL_SOLUTION | ORAL | Status: DC | PRN

## 2024-03-24 MED ORDER — HYDROMORPHONE HCL 1 MG/ML IJ SOLN
INTRAMUSCULAR | Status: AC
  Filled 2024-03-24: qty 1

## 2024-03-24 MED ORDER — PROPOFOL 10 MG/ML IV BOLUS
INTRAVENOUS | Status: DC | PRN
  Administered 2024-03-24: 120 mg via INTRAVENOUS

## 2024-03-24 MED ORDER — ACETAMINOPHEN 10 MG/ML IV SOLN
INTRAVENOUS | Status: DC | PRN
Start: 2024-03-24 — End: 2024-03-24
  Administered 2024-03-24: 1000 mg via INTRAVENOUS

## 2024-03-24 MED ORDER — HYDROMORPHONE HCL 1 MG/ML IJ SOLN
0.2500 mg | INTRAMUSCULAR | Status: DC | PRN
  Administered 2024-03-24 (×4): 0.5 mg via INTRAVENOUS

## 2024-03-24 MED ORDER — ASPIRIN 81 MG PO CHEW
81.0000 mg | CHEWABLE_TABLET | Freq: Two times a day (BID) | ORAL | Status: DC
  Administered 2024-03-24 – 2024-03-29 (×10): 81 mg via ORAL
  Filled 2024-03-24 (×10): qty 1

## 2024-03-24 MED ORDER — CEFAZOLIN SODIUM-DEXTROSE 2-4 GM/100ML-% IV SOLN
2.0000 g | Freq: Three times a day (TID) | INTRAVENOUS | Status: DC
Start: 2024-03-24 — End: 2024-03-29
  Administered 2024-03-24 – 2024-03-29 (×15): 2 g via INTRAVENOUS
  Filled 2024-03-24 (×15): qty 100

## 2024-03-24 MED ORDER — HYDROMORPHONE HCL 1 MG/ML IJ SOLN
0.5000 mg | INTRAMUSCULAR | Status: DC | PRN
  Administered 2024-03-24 – 2024-03-26 (×4): 1 mg via INTRAVENOUS
  Filled 2024-03-24 (×4): qty 1

## 2024-03-24 MED ORDER — FESOTERODINE FUMARATE ER 4 MG PO TB24
4.0000 mg | ORAL_TABLET | Freq: Every day | ORAL | Status: DC
  Administered 2024-03-25 – 2024-03-29 (×5): 4 mg via ORAL
  Filled 2024-03-24 (×5): qty 1

## 2024-03-24 MED ORDER — PROPOFOL 10 MG/ML IV BOLUS
INTRAVENOUS | Status: AC
  Filled 2024-03-24: qty 20

## 2024-03-24 MED ORDER — SUGAMMADEX SODIUM 200 MG/2ML IV SOLN
INTRAVENOUS | Status: DC | PRN
  Administered 2024-03-24: 25 mg via INTRAVENOUS
  Administered 2024-03-24: 45 mg via INTRAVENOUS
  Administered 2024-03-24 (×3): 50 mg via INTRAVENOUS
  Administered 2024-03-24: 30 mg via INTRAVENOUS

## 2024-03-24 MED ORDER — TOBRAMYCIN SULFATE 1.2 G IJ SOLR
INTRAMUSCULAR | Status: AC
  Filled 2024-03-24: qty 3.6

## 2024-03-24 MED ORDER — ROPIVACAINE HCL 5 MG/ML IJ SOLN
INTRAMUSCULAR | Status: DC | PRN
  Administered 2024-03-24: 30 mL via PERINEURAL

## 2024-03-24 MED ORDER — DROPERIDOL 2.5 MG/ML IJ SOLN: 0.6250 mg | Freq: Once | INTRAMUSCULAR | Status: DC | PRN

## 2024-03-24 MED ORDER — HYDROMORPHONE HCL 1 MG/ML IJ SOLN
INTRAMUSCULAR | Status: AC
Start: 2024-03-24 — End: 2024-03-25
  Filled 2024-03-24: qty 1

## 2024-03-24 MED ORDER — FENTANYL CITRATE (PF) 100 MCG/2ML IJ SOLN
INTRAMUSCULAR | Status: DC | PRN
  Administered 2024-03-24: 25 ug via INTRAVENOUS
  Administered 2024-03-24: 50 ug via INTRAVENOUS
  Administered 2024-03-24: 25 ug via INTRAVENOUS

## 2024-03-24 MED ORDER — LIDOCAINE HCL (PF) 2 % IJ SOLN
INTRAMUSCULAR | Status: AC
  Filled 2024-03-24: qty 5

## 2024-03-24 MED ORDER — SODIUM CHLORIDE 0.9 % IV SOLN
INTRAVENOUS | Status: AC | PRN
  Administered 2024-03-24: 1000 mL

## 2024-03-24 MED ORDER — CHLORHEXIDINE GLUCONATE 0.12 % MT SOLN
15.0000 mL | Freq: Once | OROMUCOSAL | Status: AC
  Administered 2024-03-24: 15 mL via OROMUCOSAL

## 2024-03-24 MED ORDER — MENTHOL 3 MG MT LOZG: 1.0000 | LOZENGE | OROMUCOSAL | Status: DC | PRN

## 2024-03-24 MED ORDER — EPHEDRINE SULFATE-NACL 50-0.9 MG/10ML-% IV SOSY
PREFILLED_SYRINGE | INTRAVENOUS | Status: DC | PRN
  Administered 2024-03-24: 10 mg via INTRAVENOUS
  Administered 2024-03-24: 7.5 mg via INTRAVENOUS

## 2024-03-24 MED ORDER — METOCLOPRAMIDE HCL 5 MG/ML IJ SOLN: 5.0000 mg | Freq: Three times a day (TID) | INTRAMUSCULAR | Status: DC | PRN

## 2024-03-24 MED ORDER — ACETAMINOPHEN 10 MG/ML IV SOLN
INTRAVENOUS | Status: AC
  Filled 2024-03-24: qty 100

## 2024-03-24 MED ORDER — DEXAMETHASONE SODIUM PHOSPHATE 10 MG/ML IJ SOLN
INTRAMUSCULAR | Status: AC
  Filled 2024-03-24: qty 1

## 2024-03-24 MED ORDER — ORAL CARE MOUTH RINSE: 15.0000 mL | Freq: Once | OROMUCOSAL | Status: AC

## 2024-03-24 MED ORDER — METHOCARBAMOL 1000 MG/10ML IJ SOLN
INTRAMUSCULAR | Status: AC
Start: 2024-03-24 — End: 2024-03-25
  Filled 2024-03-24: qty 10

## 2024-03-24 MED ORDER — METHOCARBAMOL 500 MG PO TABS
500.0000 mg | ORAL_TABLET | Freq: Four times a day (QID) | ORAL | Status: DC | PRN
  Administered 2024-03-24 – 2024-03-26 (×3): 500 mg via ORAL
  Filled 2024-03-24 (×4): qty 1

## 2024-03-24 MED ORDER — FENTANYL CITRATE PF 50 MCG/ML IJ SOSY
50.0000 ug | PREFILLED_SYRINGE | INTRAMUSCULAR | Status: DC
  Administered 2024-03-24: 50 ug via INTRAVENOUS
  Filled 2024-03-24: qty 2

## 2024-03-24 MED ORDER — VITAMIN C 500 MG PO TABS: 1000.0000 mg | ORAL_TABLET | Freq: Two times a day (BID) | ORAL | Status: DC

## 2024-03-24 MED ORDER — DOCUSATE SODIUM 100 MG PO CAPS
100.0000 mg | ORAL_CAPSULE | Freq: Two times a day (BID) | ORAL | Status: DC
  Administered 2024-03-24 – 2024-03-29 (×10): 100 mg via ORAL
  Filled 2024-03-24 (×10): qty 1

## 2024-03-24 MED ORDER — VANCOMYCIN HCL 1000 MG IV SOLR
INTRAVENOUS | Status: DC | PRN
  Administered 2024-03-24: 1000 mg via INTRAVENOUS

## 2024-03-24 MED ORDER — ROCURONIUM BROMIDE 10 MG/ML (PF) SYRINGE
PREFILLED_SYRINGE | INTRAVENOUS | Status: AC
  Filled 2024-03-24: qty 10

## 2024-03-24 MED ORDER — METFORMIN HCL ER 500 MG PO TB24
500.0000 mg | ORAL_TABLET | Freq: Two times a day (BID) | ORAL | Status: DC
  Administered 2024-03-25 – 2024-03-29 (×9): 500 mg via ORAL
  Filled 2024-03-24 (×9): qty 1

## 2024-03-24 SURGICAL SUPPLY — 51 items
BAG ZIPLOCK 12X15 (MISCELLANEOUS) ×1 IMPLANT
BENZOIN TINCTURE PRP APPL 2/3 (GAUZE/BANDAGES/DRESSINGS) ×1 IMPLANT
BLADE SAG 18X100X1.27 (BLADE) IMPLANT
BNDG ELASTIC 6INX 5YD STR LF (GAUZE/BANDAGES/DRESSINGS) ×1 IMPLANT
BOWL SMART MIX CTS (DISPOSABLE) IMPLANT
BRUSH FEMORAL CANAL (MISCELLANEOUS) ×1 IMPLANT
CNTNR URN SCR LID CUP LEK RST (MISCELLANEOUS) IMPLANT
COOLER ICEMAN CLASSIC (MISCELLANEOUS) ×1 IMPLANT
COVER SURGICAL LIGHT HANDLE (MISCELLANEOUS) ×1 IMPLANT
CUFF TRNQT CYL 34X4.125X (TOURNIQUET CUFF) ×1 IMPLANT
DRAPE INCISE IOBAN 66X45 STRL (DRAPES) ×3 IMPLANT
DRAPE U-SHAPE 47X51 STRL (DRAPES) ×1 IMPLANT
DRESSING AQUACEL AG SP 3.5X10 (GAUZE/BANDAGES/DRESSINGS) ×1 IMPLANT
DRSG AQUACEL AG SP 3.5X10 (GAUZE/BANDAGES/DRESSINGS) IMPLANT
DURAPREP 26ML APPLICATOR (WOUND CARE) ×1 IMPLANT
ELECT BLADE TIP CTD 4 INCH (ELECTRODE) IMPLANT
ELECT REM PT RETURN 15FT ADLT (MISCELLANEOUS) ×1 IMPLANT
GAUZE PAD ABD 8X10 STRL (GAUZE/BANDAGES/DRESSINGS) ×1 IMPLANT
GAUZE SPONGE 4X4 12PLY STRL (GAUZE/BANDAGES/DRESSINGS) ×1 IMPLANT
GAUZE XEROFORM 1X8 LF (GAUZE/BANDAGES/DRESSINGS) IMPLANT
GLOVE BIO SURGEON STRL SZ7.5 (GLOVE) ×1 IMPLANT
GLOVE BIOGEL PI IND STRL 8 (GLOVE) ×2 IMPLANT
GLOVE ECLIPSE 8.0 STRL XLNG CF (GLOVE) ×1 IMPLANT
GOWN STRL REUS W/ TWL XL LVL3 (GOWN DISPOSABLE) IMPLANT
IMMOBILIZER KNEE 20 (SOFTGOODS) ×1 IMPLANT
IMMOBILIZER KNEE 20 THIGH 36 (SOFTGOODS) IMPLANT
INSERT TBL BEARNG SZ5 10 KNEE (Miscellaneous) IMPLANT
JET LAVAGE IRRISEPT WOUND (IRRIGATION / IRRIGATOR) ×1 IMPLANT
KIT TURNOVER KIT A (KITS) IMPLANT
LAVAGE JET IRRISEPT WOUND (IRRIGATION / IRRIGATOR) IMPLANT
MANIFOLD NEPTUNE II (INSTRUMENTS) ×1 IMPLANT
NS IRRIG 1000ML POUR BTL (IV SOLUTION) ×1 IMPLANT
PACK TOTAL KNEE CUSTOM (KITS) ×1 IMPLANT
PAD COLD SHLDR WRAP-ON (PAD) IMPLANT
PADDING CAST COTTON 6X4 STRL (CAST SUPPLIES) ×1 IMPLANT
PROTECTOR NERVE ULNAR (MISCELLANEOUS) ×1 IMPLANT
SET HNDPC FAN SPRY TIP SCT (DISPOSABLE) ×1 IMPLANT
SET PAD KNEE POSITIONER (MISCELLANEOUS) ×1 IMPLANT
SOLUTION PRONTOSAN WOUND 350ML (IRRIGATION / IRRIGATOR) IMPLANT
STAPLER SKIN PROX WIDE 3.9 (STAPLE) IMPLANT
STRIP CLOSURE SKIN 1/2X4 (GAUZE/BANDAGES/DRESSINGS) IMPLANT
SUT MNCRL AB 3-0 PS2 18 (SUTURE) IMPLANT
SUT STRATAFIX PDS+ 0 24IN (SUTURE) ×1 IMPLANT
SUT VIC AB 0 CT1 36 (SUTURE) IMPLANT
SUT VIC AB 1 CT1 36 (SUTURE) ×2 IMPLANT
SUT VIC AB 2-0 CT1 TAPERPNT 27 (SUTURE) ×3 IMPLANT
SWAB COLLECTION DEVICE MRSA (MISCELLANEOUS) IMPLANT
SWAB CULTURE ESWAB REG 1ML (MISCELLANEOUS) IMPLANT
TRAY FOLEY MTR SLVR 16FR STAT (SET/KITS/TRAYS/PACK) IMPLANT
WATER STERILE IRR 1000ML POUR (IV SOLUTION) ×2 IMPLANT
YANKAUER SUCT BULB TIP NO VENT (SUCTIONS) ×1 IMPLANT

## 2024-03-24 NOTE — Anesthesia Procedure Notes (Signed)
 Anesthesia Regional Block: Adductor canal block   Pre-Anesthetic Checklist: , timeout performed,  Correct Patient, Correct Site, Correct Laterality,  Correct Procedure, Correct Position, site marked,  Risks and benefits discussed,  Surgical consent,  Pre-op evaluation,  At surgeon's request and post-op pain management  Laterality: Left  Prep: chloraprep       Needles:  Injection technique: Single-shot  Needle Type: Echogenic Stimulator Needle     Needle Length: 9cm  Needle Gauge: 21     Additional Needles:   Procedures:,,,, ultrasound used (permanent image in chart),,    Narrative:  Start time: 03/24/2024 2:20 PM End time: 03/24/2024 2:25 PM Injection made incrementally with aspirations every 5 mL.  Performed by: Personally  Anesthesiologist: Shelton Silvas, MD  Additional Notes: Discussed risks and benefits of the nerve block in detail, including but not limited vascular injury, permanent nerve damage and infection.   Patient tolerated the procedure well. Local anesthetic introduced in an incremental fashion under minimal resistance after negative aspirations. No paresthesias were elicited. After completion of the procedure, no acute issues were identified and patient continued to be monitored by RN.

## 2024-03-24 NOTE — Anesthesia Procedure Notes (Signed)
 Procedure Name: Intubation Date/Time: 03/24/2024 2:48 PM  Performed by: Elisabeth Cara, CRNAPre-anesthesia Checklist: Patient identified, Emergency Drugs available, Suction available, Patient being monitored and Timeout performed Patient Re-evaluated:Patient Re-evaluated prior to induction Oxygen Delivery Method: Circle system utilized Preoxygenation: Pre-oxygenation with 100% oxygen Induction Type: IV induction Ventilation: Mask ventilation without difficulty Laryngoscope Size: Mac and 4 Grade View: Grade I Tube type: Oral Tube size: 7.5 mm Number of attempts: 1 Airway Equipment and Method: Stylet Placement Confirmation: ETT inserted through vocal cords under direct vision, positive ETCO2 and breath sounds checked- equal and bilateral Secured at: 21 cm Tube secured with: Tape Dental Injury: Teeth and Oropharynx as per pre-operative assessment

## 2024-03-24 NOTE — Anesthesia Preprocedure Evaluation (Addendum)
 Anesthesia Evaluation  Patient identified by MRN, date of birth, ID band Patient awake    Reviewed: Allergy & Precautions, NPO status , Patient's Chart, lab work & pertinent test results  Airway Mallampati: II  TM Distance: <3 FB Neck ROM: Full    Dental  (+) Teeth Intact, Dental Advisory Given   Pulmonary neg pulmonary ROS   breath sounds clear to auscultation       Cardiovascular hypertension, Pt. on home beta blockers + Peripheral Vascular Disease  + dysrhythmias  Rhythm:Regular Rate:Normal     Neuro/Psych negative neurological ROS  negative psych ROS   GI/Hepatic negative GI ROS, Neg liver ROS,,,  Endo/Other  diabetes, Oral Hypoglycemic Agents    Renal/GU negative Renal ROS     Musculoskeletal  (+) Arthritis ,    Abdominal   Peds  Hematology negative hematology ROS (+)   Anesthesia Other Findings   Reproductive/Obstetrics                             Anesthesia Physical Anesthesia Plan  ASA: 2  Anesthesia Plan: General   Post-op Pain Management: Tylenol PO (pre-op)*   Induction: Intravenous  PONV Risk Score and Plan: 3 and Ondansetron and Treatment may vary due to age or medical condition  Airway Management Planned: Oral ETT  Additional Equipment: None  Intra-op Plan:   Post-operative Plan: Extubation in OR  Informed Consent: I have reviewed the patients History and Physical, chart, labs and discussed the procedure including the risks, benefits and alternatives for the proposed anesthesia with the patient or authorized representative who has indicated his/her understanding and acceptance.     Dental advisory given  Plan Discussed with: CRNA  Anesthesia Plan Comments:        Anesthesia Quick Evaluation

## 2024-03-24 NOTE — Interval H&P Note (Signed)
 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  There has been no acute or interval changes in medical status.  He understands that our plan is to perform an open synovectomy and irrigate and debride the knee as well as exchange the poly liner.  If we find significant infection we will need to take out all implants in place a temporary antibiotic spacer.  This has been explained to him in detail.  The risks and benefits of surgery have been discussed and informed consent has been obtained.  The left operative knee has been marked.  03/24/2024 1:25 PM  Richard Harding  has presented today for surgery, with the diagnosis of left total knee infection.  The various methods of treatment have been discussed with the patient and family. After consideration of risks, benefits and other options for treatment, the patient has consented to  Procedure(s) with comments: REMOVAL, TOTAL ARTHROPLASTY HARDWARE, KNEE, WITH ANTIBIOTIC SPACER INSERTION (Left) - LEFT TOTAL KNEE EXCISION VS. I&D LEFT KNEE WITH POLY-EXCHANGE as a surgical intervention.  The patient's history has been reviewed, patient examined, no change in status, stable for surgery.  I have reviewed the patient's chart and labs.  Questions were answered to the patient's satisfaction.     Kathryne Hitch

## 2024-03-24 NOTE — Anesthesia Postprocedure Evaluation (Addendum)
 Anesthesia Post Note  Patient: Richard Harding  Procedure(s) Performed: LEFT KNEE SYNOVECTOMY, IRRIGATION AND DEBRIDEMENT WITH POLY EXCHANGE (Left: Knee)     Patient location during evaluation: PACU Anesthesia Type: General Level of consciousness: awake and alert Pain management: pain level controlled Vital Signs Assessment: post-procedure vital signs reviewed and stable Respiratory status: spontaneous breathing, nonlabored ventilation, respiratory function stable and patient connected to nasal cannula oxygen Cardiovascular status: blood pressure returned to baseline and stable Postop Assessment: no apparent nausea or vomiting Anesthetic complications: no  No notable events documented.  Last Vitals:  Vitals:   03/24/24 1730 03/24/24 1754  BP: (!) 100/54 101/61  Pulse: 76 80  Resp: 10 16  Temp: 36.7 C 36.6 C  SpO2: 100% 97%                Shelton Silvas

## 2024-03-24 NOTE — Op Note (Signed)
 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 Item Serial No. Manufacturer Lot No. LRB No. Used Action  INSERT TBL BEARNG SZ5 10 KNEE - [id number] Miscellaneous INSERT TBL BEARNG SZ5 10 KNEE  [employer] [id number] Left 1 Implanted   ' Surgeon: Vanita Panda. Magnus Ivan, MD Assistant: Rexene Edison, PA-C  Anesthesia: General EBL: 100 cc Antibiotics: IV vancomycin after cultures obtained Tourniquet time: Under 1 hour Complications: None  Indications: The patient is a 78 year old gentleman well-known to [place].  We have actually replaced both of his knees.  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  His more recent left knee was replaced in October 2023.  He has done very well with both his knee replacements and had no issues until recently he developed swelling of his left knee.  He has been dealing with prostate issues and even had had a urinary tract infection that was treated with antibiotics.  This was sometime prior to him developing left knee swelling.  He has been asymptomatic with his right knee.  A month ago we did see him in the office and we aspirated fluid from his knee and sent this off for Gram stain and culture which was negative.  However there was 49,000 white cells which is concerning.  His peripheral white blood cell count was normal and his sed rate was slightly elevated in the 40s and his CRP was 7.  At this point we have recommended an open arthrotomy with synovectomy, poly exchange and possible excision arthroplasty.  I  explained in detail why we are recommending to this.  He still remains with left knee swelling but he denies any fever and chills and says is not painful to him.  Again, the Gram stain from the fluid that we took out of his knee was negative and he had been off antibiotics for a long period of time.  Procedure description: After informed consent was obtained and the appropriate left knee was marked, the patient was brought to the operating room and placed upon the operating table.  General anesthesia was then obtained.  A nonsterile tractors placed around his upper left thigh and his left thigh, knee, leg and ankle were prepped and draped in DuraPrep and sterile drapes.  A timeout was called and he was then advised the correct patient and correct left knee.  We had the tourniquet inflated to 300 mm of pressure.  We then made a direct midline incision over patella through his previous incision and dissected down the knee joint carrying out a medial parapatellar arthrotomy.  We encountered a very large joint effusion again and this seem to be consistent with some type of inflammatory or infectious process.  There was a lot of sediment in the knee.  We sent off fluid immediately for Gram stain and culture and then we removed it with an abundant amount of synovium throughout the knee and sent the soft tissue off for Gram stain and culture.  We were able to perform a complete synovectomy and remove the poly liner that was previously in his knee so we get to the back of the knee.  We removed synovium all from the back of the knee as well and cauterized the entire joint capsule.  We did not find any loosening of the femoral or tibial components.  They are press-fit components and on radiographic views of plain x-rays of the knee that shows no evidence of loosening.  Mechanically within the knee we do not see any sign of loosening of the components.  We then irrigated the knee with 3 L normal saline solution using pulse  lavage.  Next we used 2 different irrigation solutions 1 that was chlorhexidine-based and the other antibiotic based the irrigate through his knee.  We let that sit in there for 5 to 10 minutes.  We then placed a brand-new poly liner which was a 10 mm thickness polythene liner for a size 5 tibial tray that correlates with his [employer]ryker knee.  This was stable.  We then let the tourniquet down and hemostasis was obtained electrocautery.  We placed vancomycin powder within the arthrotomy and then closed the arthrotomy with interrupted #1 Vicryl suture.  0 Vicryl is used to close deep tissue and 2-0 Vicryl is used because subcutaneous tissue.  The skin was closed with staples.  Well-padded sterile dressings applied.  The patient was awakened, extubated and taken the recovery room in stable condition.  Postoperatively he will be admitted as an inpatient and started on IV antibiotics while we see what the culture show.  Likely we will obtain a consult from the infectious disease service and will have a PICC line placed.  The patient does live alone so he will likely need short-term skilled nursing placement.  We will allow him to be up with weightbearing as tolerated working with PT as well.  Rexene Edison, PA-C did assist during entire case and beginning to end and his assistance was crucial and medically necessary for soft tissue management and retraction, helping guide implant placement and a layered closure of the wound.

## 2024-03-24 NOTE — Transfer of Care (Signed)
 Immediate Anesthesia Transfer of Care Note  Patient: Richard Harding  Procedure(s) Performed: LEFT KNEE SYNOVECTOMY, IRRIGATION AND DEBRIDEMENT WITH POLY EXCHANGE (Left: Knee)  Patient Location: PACU  Anesthesia Type:General  Level of Consciousness: awake, alert , and oriented  Airway & Oxygen Therapy: Patient Spontanous Breathing and Patient connected to face mask oxygen  Post-op Assessment: Report given to RN and Post -op Vital signs reviewed and stable  Post vital signs: Reviewed and stable  Last Vitals:  Vitals Value Taken Time  BP 105/63 03/24/24 1631  Temp 36.5 C 03/24/24 1631  Pulse 81 03/24/24 1638  Resp 14 03/24/24 1638  SpO2 100 % 03/24/24 1638  Vitals shown include unfiled device data.  Last Pain:  Vitals:   03/24/24 1631  TempSrc:   PainSc: 0-No pain         Complications: No notable events documented.

## 2024-03-25 LAB — CBC
HCT: 27.5 % — ABNORMAL LOW (ref 39.0–52.0)
Hemoglobin: 9 g/dL — ABNORMAL LOW (ref 13.0–17.0)
MCH: 28 pg (ref 26.0–34.0)
MCHC: 32.7 g/dL (ref 30.0–36.0)
MCV: 85.4 fL (ref 80.0–100.0)
Platelets: 383 10*3/uL (ref 150–400)
RBC: 3.22 MIL/uL — ABNORMAL LOW (ref 4.22–5.81)
RDW: 12.7 % (ref 11.5–15.5)
WBC: 8.3 10*3/uL (ref 4.0–10.5)
nRBC: 0 % (ref 0.0–0.2)

## 2024-03-25 LAB — BASIC METABOLIC PANEL WITH GFR
Anion gap: 12 (ref 5–15)
BUN: 21 mg/dL (ref 8–23)
CO2: 23 mmol/L (ref 22–32)
Calcium: 9 mg/dL (ref 8.9–10.3)
Chloride: 101 mmol/L (ref 98–111)
Creatinine, Ser: 1 mg/dL (ref 0.61–1.24)
GFR, Estimated: >60 mL/min (ref >60)
Glucose, Bld: 171 mg/dL — ABNORMAL HIGH (ref 70–99)
Potassium: 3.4 mmol/L — ABNORMAL LOW (ref 3.5–5.1)
Sodium: 136 mmol/L (ref 135–145)

## 2024-03-25 NOTE — Progress Notes (Signed)
 Physical Therapy Treatment Patient Details Name: Richard Harding MRN: [id number] DOB: Nov 29, 1946 Today's Date: 03/25/2024   History of Present Illness Pt admitted with L knee synovitis with effusion and questionable infected TKR and now s/p I&D with poly exchange.  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  At pt request, up to sink to brush teeth and to hallway to ambulate increased distance.    If plan is discharge home, recommend the following: A little help with walking and/or transfers   Can travel by private vehicle        Equipment Recommendations  None recommended by PT    Recommendations for Other Services       Precautions / Restrictions Precautions Precautions: Fall Restrictions Weight Bearing Restrictions Per Provider Order: No Other Position/Activity Restrictions: WBAT     Mobility  Bed Mobility Overal bed mobility: Needs Assistance Bed Mobility: Supine to Sit     Supine to sit: Min assist     General bed mobility comments: Pt up in chair and requests back to same    Transfers Overall transfer level: Needs assistance Equipment used: Rolling walker (2 wheels) Transfers: Sit to/from Stand Sit to Stand: Min assist           General transfer comment: cues for LE management and use of UEs to self assist    Ambulation/Gait Ambulation/Gait assistance: Min assist Gait Distance (Feet): 34 Feet (34' twice with standing rest break between) Assistive device: Rolling walker (2 wheels) Gait Pattern/deviations: Step-to pattern, Decreased step length - right, Decreased step length - left, Shuffle, Trunk flexed Gait velocity: decr     General Gait Details: cues for sequence, posture and positon from [employer] Mobility     Tilt Bed    Modified Rankin (Stroke Patients Only)       Balance Overall balance  assessment: Needs assistance Sitting-balance support: No upper extremity supported, Feet supported Sitting balance-Leahy Scale: Good     Standing balance support: Bilateral upper extremity supported Standing balance-Leahy Scale: Poor                              Communication Communication Communication: No apparent difficulties  Cognition Arousal: Alert Behavior During Therapy: WFL for tasks assessed/performed   PT - Cognitive impairments: No apparent impairments                         Following commands: Intact      Cueing Cueing Techniques: Verbal cues  Exercises Total Joint Exercises Ankle Circles/Pumps: AROM, Both, 15 reps, Supine    General Comments        Pertinent Vitals/Pain Pain Assessment Pain Assessment: 0-10 Pain Score: 7  Pain Location: L knee Pain Descriptors / Indicators: Aching, Sore Pain Intervention(s): Limited activity within patient's tolerance, Monitored during session, Premedicated before session    Home Living Family/patient expects to be discharged to:: Skilled nursing facility Living Arrangements: Alone Available Help at Discharge: Family Type of Home: House Home Access: Level entry       Home Layout: One level Home Equipment: Agricultural consultant (2 wheels);Cane - single point      Prior Function            PT  Goals (current goals can now be found in the care plan section) Acute Rehab PT Goals Patient Stated Goal: REgain IND PT Goal Formulation: With patient Time For Goal Achievement: 04/01/24 Potential to Achieve Goals: Good Progress towards PT goals: Progressing toward goals    Frequency    7X/week      PT Plan      Co-evaluation              AM-PAC PT "6 Clicks" Mobility   Outcome Measure  Help needed turning from your back to your side while in a flat bed without using bedrails?: A Little Help needed moving from lying on your back to sitting on the side of a flat bed without using  bedrails?: A Little Help needed moving to and from a bed to a chair (including a wheelchair)?: A Little Help needed standing up from a chair using your arms (e.g., wheelchair or bedside chair)?: A Little Help needed to walk in hospital room?: A Little Help needed climbing 3-5 steps with a railing? : A Lot 6 Click Score: 17    End of Session Equipment Utilized During Treatment: Gait belt Activity Tolerance: Patient tolerated treatment well Patient left: in chair;with call bell/phone within reach;with chair alarm set Nurse Communication: Mobility status PT Visit Diagnosis: Difficulty in walking, not elsewhere classified (R26.2)     Time: 1610-9604 PT Time Calculation (min) (ACUTE ONLY): 34 min  Charges:    $Gait Training: 8-22 mins $Therapeutic Activity: 8-22 mins PT General Charges $$ ACUTE PT VISIT: 1 Visit                     Richard Harding PT Acute Rehabilitation Services Pager [phone] Office [phone]    Richard Harding 03/25/2024, 3:58 PM

## 2024-03-25 NOTE — Evaluation (Signed)
 Physical Therapy Evaluation Patient Details Name: Richard Harding MRN: [id number] DOB: 1946/02/10 Today's Date: 03/25/2024  History of Present Illness  Pt admitted with L knee synovitis with effusion and questionable infected TKR and now s/p I&D with poly exchange.  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  Pt lives alone and reports plans to dc to SNF level rehab to maximize IND and safety for return home with very limited assist.        If plan is discharge home, recommend the following: A little help with walking and/or transfers   Can travel by private vehicle        Equipment Recommendations None recommended by PT  Recommendations for Other Services       Functional Status Assessment Patient has had a recent decline in their functional status and demonstrates the ability to make significant improvements in function in a reasonable and predictable amount of time.     Precautions / Restrictions Precautions Precautions: Fall Restrictions Weight Bearing Restrictions Per Provider Order: No Other Position/Activity Restrictions: WBAT      Mobility  Bed Mobility Overal bed mobility: Needs Assistance Bed Mobility: Supine to Sit     Supine to sit: Min assist     General bed mobility comments: Increased time with cues for sequence and use of R LE to self assist.  Physical assist to manage L LE    Transfers Overall transfer level: Needs assistance Equipment used: Rolling walker (2 wheels) Transfers: Sit to/from Stand Sit to Stand: Min assist           General transfer comment: cues for LE management and use of UEs to self assist    Ambulation/Gait Ambulation/Gait assistance: Min assist Gait Distance (Feet): 34 Feet Assistive device: Rolling walker (2 wheels) Gait Pattern/deviations: Step-to pattern, Decreased step length  - right, Decreased step length - left, Shuffle, Trunk flexed Gait velocity: decr     General Gait Details: cues for sequence, posture and positon from [employer] Mobility     Tilt Bed    Modified Rankin (Stroke Patients Only)       Balance Overall balance assessment: Needs assistance Sitting-balance support: No upper extremity supported, Feet supported Sitting balance-Leahy Scale: Good     Standing balance support: Bilateral upper extremity supported Standing balance-Leahy Scale: Poor                               Pertinent Vitals/Pain Pain Assessment Pain Assessment: 0-10 Pain Score: 8  Pain Location: L knee Pain Descriptors / Indicators: Aching, Sore Pain Intervention(s): Limited activity within patient's tolerance, Monitored during session, Premedicated before session, Ice applied    Home Living Family/patient expects to be discharged to:: Skilled nursing facility Living Arrangements: Alone Available Help at Discharge: Family Type of Home: House Home Access: Level entry       Home Layout: One level Home Equipment: Agricultural consultant (2 wheels);Cane - single point      Prior Function Prior Level of Function : Independent/Modified Independent                     Extremity/Trunk Assessment   Upper Extremity Assessment Upper Extremity Assessment: Overall WFL for tasks assessed  Lower Extremity Assessment Lower Extremity Assessment: LLE deficits/detail    Cervical / Trunk Assessment Cervical / Trunk Assessment: Normal  Communication   Communication Communication: No apparent difficulties    Cognition Arousal: Alert Behavior During Therapy: WFL for tasks assessed/performed   PT - Cognitive impairments: No apparent impairments                         Following commands: Intact       Cueing Cueing Techniques: Verbal cues     General Comments      Exercises Total Joint  Exercises Ankle Circles/Pumps: AROM, Both, 15 reps, Supine   Assessment/Plan    PT Assessment Patient needs continued PT services  PT Problem List Decreased strength;Decreased range of motion;Decreased activity tolerance;Decreased balance;Decreased mobility;Decreased knowledge of use of DME;Pain       PT Treatment Interventions DME instruction;Gait training;Functional mobility training;Therapeutic activities;Therapeutic exercise;Patient/family education    PT Goals (Current goals can be found in the Care Plan section)  Acute Rehab PT Goals Patient Stated Goal: REgain IND PT Goal Formulation: With patient Time For Goal Achievement: 04/01/24 Potential to Achieve Goals: Good    Frequency 7X/week     Co-evaluation               AM-PAC PT "6 Clicks" Mobility  Outcome Measure Help needed turning from your back to your side while in a flat bed without using bedrails?: A Little Help needed moving from lying on your back to sitting on the side of a flat bed without using bedrails?: A Little Help needed moving to and from a bed to a chair (including a wheelchair)?: A Little Help needed standing up from a chair using your arms (e.g., wheelchair or bedside chair)?: A Little Help needed to walk in hospital room?: A Little Help needed climbing 3-5 steps with a railing? : A Lot 6 Click Score: 17    End of Session Equipment Utilized During Treatment: Gait belt Activity Tolerance: Patient tolerated treatment well Patient left: in chair;with call bell/phone within reach;with chair alarm set Nurse Communication: Mobility status PT Visit Diagnosis: Difficulty in walking, not elsewhere classified (R26.2)    Time: 1610-9604 PT Time Calculation (min) (ACUTE ONLY): 45 min   Charges:   PT Evaluation $PT Eval Low Complexity: 1 Low PT Treatments $Gait Training: 8-22 mins PT General Charges $$ ACUTE PT VISIT: 1 Visit         [name] PT Acute Rehabilitation Services Pager  [phone] Office [phone]   Ladeidra Borys 03/25/2024, 3:52 PM

## 2024-03-25 NOTE — Progress Notes (Addendum)
 Patient ID: Richard Harding, male   DOB: 1946-08-30, 78 y.o.   MRN: [id number] The patient is awake and alert this morning.  He does report appropriate left knee pain from his surgery.  We were able to take him to the operating room yesterday and perform an open synovectomy of all 3 compartments and a poly liner exchange.  There was significant fluid in the knee that was concerning for infection.  We were able to send fluid and tissue samples for Gram stain and cultures.  So far those are negative for any organisms.  He had been on and off of antibiotics last year for recurrent UTI and the last notes showing him having completed antibiotics was in October 2024 for completing a 7 days course to treat the urinary tract infection.  He has a history of prostate cancer and underwent radiation treatment for his prostate cancer.  He says his PSA has been coming down.  He has not been on antibiotics for at least 5 months months now.  When we saw him in the office 3 weeks ago, we did take fluid off of his left knee and found organisms but 49,000 white blood cells.  This was concerning for infection.  His CRP was 7 and his sed rate was in the 40s.  He has not had any fever and chills and prior to surgery yesterday said his pain has been minimal and that left knee with no redness but definitely fluid.  Our plan will be to watch the cultures over the weekend and continue IV antibiotics.  We will assess early in the week the need for a PICC line and ID consult.  He is someone who does live alone and is requesting short-term skilled nursing placement so we will consult the transitional care team.  Obviously his final destination will depend on whether or not he has a PICC line and needs 6 weeks of IV antibiotics.

## 2024-03-25 NOTE — Plan of Care (Signed)
  Problem: Education: Goal: Knowledge of General Education information will improve Description: Including pain rating scale, medication(s)/side effects and non-pharmacologic comfort measures Outcome: Progressing   Problem: Health Behavior/Discharge Planning: Goal: Ability to manage health-related needs will improve Outcome: Progressing   Problem: Clinical Measurements: Goal: Ability to maintain clinical measurements within normal limits will improve Outcome: Progressing Goal: Will remain free from infection Outcome: Progressing Goal: Diagnostic test results will improve Outcome: Progressing Goal: Respiratory complications will improve Outcome: Progressing Goal: Cardiovascular complication will be avoided Outcome: Progressing   Problem: Activity: Goal: Risk for activity intolerance will decrease Outcome: Adequate for Discharge   Problem: Nutrition: Goal: Adequate nutrition will be maintained Outcome: Completed/Met   Problem: Coping: Goal: Level of anxiety will decrease Outcome: Progressing   Problem: Elimination: Goal: Will not experience complications related to bowel motility Outcome: Progressing Goal: Will not experience complications related to urinary retention Outcome: Completed/Met   Problem: Pain Managment: Goal: General experience of comfort will improve and/or be controlled Outcome: Adequate for Discharge   Problem: Safety: Goal: Ability to remain free from injury will improve Outcome: Progressing   Problem: Skin Integrity: Goal: Risk for impaired skin integrity will decrease Outcome: Adequate for Discharge   Problem: Education: Goal: Knowledge of the prescribed therapeutic regimen will improve Outcome: Progressing Goal: Individualized Educational Video(s) Outcome: Completed/Met   Problem: Activity: Goal: Ability to avoid complications of mobility impairment will improve Outcome: Adequate for Discharge Goal: Range of joint motion will  improve Outcome: Adequate for Discharge   Problem: Clinical Measurements: Goal: Postoperative complications will be avoided or minimized Outcome: Progressing   Problem: Pain Management: Goal: Pain level will decrease with appropriate interventions Outcome: Adequate for Discharge   Problem: Skin Integrity: Goal: Will show signs of wound healing Outcome: Progressing

## 2024-03-26 LAB — CBC
HCT: 26.1 % — ABNORMAL LOW (ref 39.0–52.0)
Hemoglobin: 8.6 g/dL — ABNORMAL LOW (ref 13.0–17.0)
MCH: 28.3 pg (ref 26.0–34.0)
MCHC: 33 g/dL (ref 30.0–36.0)
MCV: 85.9 fL (ref 80.0–100.0)
Platelets: 374 10*3/uL (ref 150–400)
RBC: 3.04 MIL/uL — ABNORMAL LOW (ref 4.22–5.81)
RDW: 13 % (ref 11.5–15.5)
WBC: 6.4 10*3/uL (ref 4.0–10.5)
nRBC: 0 % (ref 0.0–0.2)

## 2024-03-26 LAB — C-REACTIVE PROTEIN: CRP: 3.4 mg/dL — ABNORMAL HIGH (ref <1.0)

## 2024-03-26 LAB — SEDIMENTATION RATE: Sed Rate: 70 mm/h — ABNORMAL HIGH (ref 0–16)

## 2024-03-26 MED ORDER — POLYETHYLENE GLYCOL 3350 17 G PO PACK
17.0000 g | PACK | Freq: Every day | ORAL | Status: DC | PRN
  Administered 2024-03-26 – 2024-03-29 (×3): 17 g via ORAL
  Filled 2024-03-26 (×3): qty 1

## 2024-03-26 MED ORDER — DOCUSATE SODIUM 100 MG PO CAPS: 100.0000 mg | ORAL_CAPSULE | Freq: Two times a day (BID) | ORAL | Status: DC

## 2024-03-26 NOTE — Progress Notes (Signed)
 Physical Therapy Treatment Patient Details Name: Richard Harding MRN: [id number] DOB: [birth year] Today's Date: 03/26/2024   History of Present Illness Pt admitted with L knee synovitis with effusion and questionable infected TKR and now s/p I&D with poly exchange.  Ot with hx of bil TKR and prostate CA    PT Comments  Pt in good spirits and reports pain better controlled.  Pt continues to required increased time for all tasks but with decreasing level of assist noted.      If plan is discharge home, recommend the following: A little help with walking and/or transfers   Can travel by private vehicle        Equipment Recommendations  None recommended by PT    Recommendations for Other Services       Precautions / Restrictions Precautions Precautions: Fall Restrictions Weight Bearing Restrictions Per Provider Order: Yes LLE Weight Bearing Per Provider Order: Weight bearing as tolerated Other Position/Activity Restrictions: WBAT     Mobility  Bed Mobility Overal bed mobility: Needs Assistance Bed Mobility: Supine to Sit     Supine to sit: Contact guard     General bed mobility comments: INcreased time with CGA for safety only    Transfers Overall transfer level: Needs assistance Equipment used: Rolling walker (2 wheels) Transfers: Sit to/from Stand Sit to Stand: Contact guard assist           General transfer comment: cues for LE management and use of UEs to self assist    Ambulation/Gait Ambulation/Gait assistance: Min assist, Contact guard assist Gait Distance (Feet): 50 Feet (50' twice) Assistive device: Rolling walker (2 wheels) Gait Pattern/deviations: Step-to pattern, Decreased step length - right, Decreased step length - left, Shuffle, Trunk flexed Gait velocity: decr     General Gait Details: cues for sequence, posture and positon from [employer] Mobility     Tilt Bed    Modified Rankin (Stroke Patients  Only)       Balance Overall balance assessment: Needs assistance Sitting-balance support: No upper extremity supported, Feet supported Sitting balance-Leahy Scale: Good     Standing balance support: Single extremity supported Standing balance-Leahy Scale: Poor                              Communication Communication Communication: No apparent difficulties  Cognition Arousal: Alert Behavior During Therapy: WFL for tasks assessed/performed   PT - Cognitive impairments: No apparent impairments                         Following commands: Intact      Cueing Cueing Techniques: Verbal cues  Exercises      General Comments        Pertinent Vitals/Pain Pain Assessment Pain Assessment: 0-10 Pain Score: 4  Pain Location: L knee Pain Descriptors / Indicators: Aching, Sore Pain Intervention(s): Limited activity within patient's tolerance, Monitored during session, Premedicated before session    Home Living                          Prior Function            PT Goals (current goals can now be found in the care plan section) Acute Rehab PT Goals Patient Stated Goal: REgain IND PT Goal Formulation: With patient Time For Goal  Achievement: 04/01/24 Potential to Achieve Goals: Good Progress towards PT goals: Progressing toward goals    Frequency    7X/week      PT Plan      Co-evaluation              AM-PAC PT "6 Clicks" Mobility   Outcome Measure  Help needed turning from your back to your side while in a flat bed without using bedrails?: A Little Help needed moving from lying on your back to sitting on the side of a flat bed without using bedrails?: A Little Help needed moving to and from a bed to a chair (including a wheelchair)?: A Little Help needed standing up from a chair using your arms (e.g., wheelchair or bedside chair)?: A Little Help needed to walk in hospital room?: A Little Help needed climbing 3-5 steps with  a railing? : A Lot 6 Click Score: 17    End of Session Equipment Utilized During Treatment: Gait belt Activity Tolerance: Patient tolerated treatment well Patient left: in chair;with call bell/phone within reach;with chair alarm set Nurse Communication: Mobility status PT Visit Diagnosis: Difficulty in walking, not elsewhere classified (R26.2)     Time: 1324-4010 PT Time Calculation (min) (ACUTE ONLY): 31 min  Charges:    $Gait Training: 23-37 mins PT General Charges $$ ACUTE PT VISIT: 1 Visit                     [name] PT Acute Rehabilitation Services Pager [phone] Office [phone]    Marbin Olshefski 03/26/2024, 12:13 PM

## 2024-03-26 NOTE — Progress Notes (Signed)
 Patient ID: Richard Harding, male   DOB: January 15, 1946, 78 y.o.   MRN: [id number] The patient is up and working with PT currently.  His left knee dressing is clean and dry.  The intraoperative cultures from his left knee are negative for organisms thus far and are being reintubated.  These were from fluid and from soft tissue.  The transitional care team has been consulted for short-term skilled nursing placement.  Tomorrow we will order a PICC line and consult the infectious disease service for guidance as to the type of antibiotic and duration of antibiotics.

## 2024-03-26 NOTE — Plan of Care (Signed)

## 2024-03-26 NOTE — Progress Notes (Signed)
 Physical Therapy Treatment Patient Details Name: Richard Harding MRN: [id number] DOB: 07-15-1946 Today's Date: 03/26/2024   History of Present Illness Pt admitted with L knee synovitis with effusion and questionable infected TKR and now s/p I&D with poly exchange.  Ot with hx of bil TKR and prostate CA    PT Comments  Pt in good spirits and reports pain better controlled. Pt continues to require increased time for all tasks but with decreasing level of assist noted as well as improvement in balance and WB tolerance on L.    If plan is discharge home, recommend the following: A little help with walking and/or transfers   Can travel by private vehicle        Equipment Recommendations  None recommended by PT    Recommendations for Other Services       Precautions / Restrictions Precautions Precautions: Fall Restrictions Weight Bearing Restrictions Per Provider Order: Yes LLE Weight Bearing Per Provider Order: Weight bearing as tolerated Other Position/Activity Restrictions: WBAT     Mobility  Bed Mobility Overal bed mobility: Needs Assistance Bed Mobility: Supine to Sit, Sit to Supine     Supine to sit: Contact guard Sit to supine: Min assist   General bed mobility comments: INcreased time with CGA for safety only to exit bed but assist to get LEs back into bed    Transfers Overall transfer level: Needs assistance Equipment used: Rolling walker (2 wheels) Transfers: Sit to/from Stand Sit to Stand: Contact guard assist           General transfer comment: cues for LE management and use of UEs to self assist    Ambulation/Gait Ambulation/Gait assistance: Contact guard assist Gait Distance (Feet): 50 Feet (50' twice) Assistive device: Rolling walker (2 wheels) Gait Pattern/deviations: Step-to pattern, Decreased step length - right, Decreased step length - left, Shuffle, Trunk flexed Gait velocity: decr     General Gait Details: cues for sequence, posture and  positon from [employer] Mobility     Tilt Bed    Modified Rankin (Stroke Patients Only)       Balance Overall balance assessment: Needs assistance Sitting-balance support: No upper extremity supported, Feet supported Sitting balance-Leahy Scale: Good     Standing balance support: No upper extremity supported Standing balance-Leahy Scale: Fair                              Hotel manager: No apparent difficulties  Cognition Arousal: Alert Behavior During Therapy: WFL for tasks assessed/performed   PT - Cognitive impairments: No apparent impairments                         Following commands: Intact      Cueing Cueing Techniques: Verbal cues  Exercises      General Comments        Pertinent Vitals/Pain Pain Assessment Pain Assessment: 0-10 Pain Score: 3  Pain Location: L knee Pain Descriptors / Indicators: Aching, Sore Pain Intervention(s): Limited activity within patient's tolerance, Monitored during session, Premedicated before session, Ice applied    Home Living                          Prior Function            PT Goals (current goals  can now be found in the care plan section) Acute Rehab PT Goals Patient Stated Goal: REgain IND PT Goal Formulation: With patient Time For Goal Achievement: 04/01/24 Potential to Achieve Goals: Good Progress towards PT goals: Progressing toward goals    Frequency    7X/week      PT Plan      Co-evaluation              AM-PAC PT "6 Clicks" Mobility   Outcome Measure  Help needed turning from your back to your side while in a flat bed without using bedrails?: A Little Help needed moving from lying on your back to sitting on the side of a flat bed without using bedrails?: A Little Help needed moving to and from a bed to a chair (including a wheelchair)?: A Little Help needed standing up from a chair using your  arms (e.g., wheelchair or bedside chair)?: A Little Help needed to walk in hospital room?: A Little Help needed climbing 3-5 steps with a railing? : A Lot 6 Click Score: 17    End of Session Equipment Utilized During Treatment: Gait belt Activity Tolerance: Patient tolerated treatment well Patient left: in bed;with call bell/phone within reach;with bed alarm set Nurse Communication: Mobility status PT Visit Diagnosis: Difficulty in walking, not elsewhere classified (R26.2)     Time: 1308-6578 PT Time Calculation (min) (ACUTE ONLY): 29 min  Charges:    $Gait Training: 23-37 mins PT General Charges $$ ACUTE PT VISIT: 1 Visit                     Richard Harding PT Acute Rehabilitation Services Pager [phone] Office [phone]    Richard Harding 03/26/2024, 4:21 PM

## 2024-03-27 ENCOUNTER — Encounter (HOSPITAL_COMMUNITY): Payer: Self-pay | Admitting: Orthopaedic Surgery

## 2024-03-27 ENCOUNTER — Other Ambulatory Visit: Payer: Self-pay

## 2024-03-27 DIAGNOSIS — B957 Other staphylococcus as the cause of diseases classified elsewhere: Secondary | ICD-10-CM | POA: Diagnosis not present

## 2024-03-27 DIAGNOSIS — M65962 Unspecified synovitis and tenosynovitis, left lower leg: Secondary | ICD-10-CM | POA: Diagnosis not present

## 2024-03-27 DIAGNOSIS — T8454XA Infection and inflammatory reaction due to internal left knee prosthesis, initial encounter: Secondary | ICD-10-CM | POA: Diagnosis not present

## 2024-03-27 MED ORDER — SODIUM CHLORIDE 0.9% FLUSH: 10.0000 mL | INTRAVENOUS | Status: DC | PRN

## 2024-03-27 MED ORDER — SODIUM CHLORIDE 0.9% FLUSH
10.0000 mL | Freq: Two times a day (BID) | INTRAVENOUS | Status: DC
  Administered 2024-03-27 – 2024-03-28 (×2): 10 mL

## 2024-03-27 MED ORDER — CHLORHEXIDINE GLUCONATE CLOTH 2 % EX PADS
6.0000 | MEDICATED_PAD | Freq: Every day | CUTANEOUS | Status: DC
  Administered 2024-03-28 – 2024-03-29 (×3): 6 via TOPICAL

## 2024-03-27 MED ORDER — DAPTOMYCIN-SODIUM CHLORIDE 700-0.9 MG/100ML-% IV SOLN
8.0000 mg/kg | Freq: Every day | INTRAVENOUS | Status: DC
  Administered 2024-03-27: 700 mg via INTRAVENOUS
  Filled 2024-03-27 (×2): qty 100

## 2024-03-27 NOTE — TOC Initial Note (Signed)
 Transition of Care [facility]) - Initial/Assessment Note    Patient Details  Name: Richard Harding MRN: [id number] Date of Birth: 1946/08/06  [facility]) CM/SW Contact:    Amada Jupiter, LCSW Phone Number: 03/27/2024, 12:42 PM  Clinical Narrative:                  Met with pt today to review anticipated dc needs. Pt confirms that he lives alone and has friends who offer support as needed.  Aware that MD anticipates he will need ongoing IV abx and is recommending SNF rehab.  Pt is agreeable with this plan.  Will begin bed search, however, still awaiting PICC placement and ID consult as well.    Expected Discharge Plan: Skilled Nursing Facility Barriers to Discharge: Continued Medical Work up, English as a second language teacher, SNF Pending bed offer   Patient Goals and CMS Choice Patient states their goals for this hospitalization and ongoing recovery are:: return home following rehab          Expected Discharge Plan and Services In-house Referral: Clinical Social Work   Post Acute Care Choice: Skilled Nursing Facility Living arrangements for the past 2 months: Single Family Home                 DME Arranged: N/A DME Agency: NA                  Prior Living Arrangements/Services Living arrangements for the past 2 months: Single Family Home Lives with:: Self Patient language and need for interpreter reviewed:: Yes Do you feel safe going back to the place where you live?: Yes      Need for Family Participation in Patient Care: Yes (Comment) Care giver support system in place?: No (comment)   Criminal Activity/Legal Involvement Pertinent to Current Situation/Hospitalization: No - Comment as needed  Activities of Daily Living   ADL Screening (condition at time of admission) Independently performs ADLs?: Yes (appropriate for developmental age) Is the patient deaf or have difficulty hearing?: No Does the patient have difficulty seeing, even when wearing  glasses/contacts?: No Does the patient have difficulty concentrating, remembering, or making decisions?: No  Permission Sought/Granted Permission sought to share information with : Facility Medical sales representative, Other (comment) Permission granted to share information with : Yes, Verbal Permission Granted  Share Information with NAME: friend, Sander Radon @ [phone]  Permission granted to share info w AGENCY: SNFs        Emotional Assessment Appearance:: Appears stated age Attitude/Demeanor/Rapport: Gracious, Engaged Affect (typically observed): Accepting Orientation: : Oriented to Self, Oriented to Place, Oriented to  Time, Oriented to Situation Alcohol / Substance Use: Not Applicable Psych Involvement: No (comment)  Admission diagnosis:  Infected prosthetic knee joint, subsequent encounter [T84.59XD, Z96.659] 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) [T84.54XA] 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 10/06/2023   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/03/2023   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   Elevated PSA 02/23/2023   Left leg swelling 10/05/2022   History of left knee replacement 09/25/2022   Chest pain 05/26/2022   Unilateral primary osteoarthritis, left knee 04/08/2022   Synovitis of left knee 03/26/2022   Rash 03/26/2022   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 [place] [address] Phone: [phone] Fax:  [phone]     Social Drivers of Health (SDOH) Social History: SDOH Screenings   Food Insecurity: Food Insecurity Present (03/24/2024)  Housing: Low Risk  (03/24/2024)  Transportation Needs: No Transportation Needs (03/24/2024)  Utilities: Not At Risk (03/24/2024)  Alcohol Screen: Low Risk  (10/22/2023)  Depression (PHQ2-9): Low Risk  (02/14/2024)  Financial Resource Strain: Low Risk  (07/19/2023)  Physical Activity: Inactive (07/19/2023)  Social Connections: Socially Isolated (03/24/2024)  Stress: No Stress Concern Present (07/19/2023)  Tobacco Use: Low Risk  (03/24/2024)  Health Literacy: Adequate Health Literacy (07/19/2023)   SDOH Interventions:     Readmission Risk Interventions    03/27/2024   11:47 AM  Readmission Risk Prevention Plan  Post Dischage Appt Complete  Medication Screening Complete  Transportation Screening Complete

## 2024-03-27 NOTE — [place] FL2 (Signed)
 [place] MEDICAID FL2 LEVEL OF CARE FORM     IDENTIFICATION  Patient Name: Richard Harding Birthdate: Aug 26, 1946 Sex: male Admission Date (Current Location): 03/24/2024  [facility] and [place] Number:  Producer, television/film/video and Address:  [facility],  [address] 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 10/06/2023   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/03/2023   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   Elevated PSA 02/23/2023   Left leg swelling 10/05/2022   History of left knee replacement 09/25/2022   Chest pain 05/26/2022   Unilateral primary osteoarthritis, left knee 04/08/2022   Synovitis of left knee 03/26/2022   Rash 03/26/2022   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/26/2020   Hypokalemia 01/12/2019   DM (diabetes mellitus) type II, controlled, with peripheral vascular disorder (HCC) 09/16/2017   Essential hypertension 01/18/2014   Hyperlipidemia LDL goal <70 01/18/2014   Obesity (BMI 30-39.9) 01/18/2014    Orientation RESPIRATION BLADDER Height & Weight     Self, Time, Situation, Place  Normal Continent Weight: 187 lb 6.3 oz (85 kg) Height:  5\' 9"  (175.3 cm)  BEHAVIORAL SYMPTOMS/MOOD NEUROLOGICAL BOWEL NUTRITION STATUS      Continent Diet (regular)  AMBULATORY STATUS COMMUNICATION OF NEEDS Skin   Limited Assist Verbally Other (Comment) (surgical incision only)                        Personal Care Assistance Level of Assistance  Bathing, Dressing Bathing Assistance: Limited assistance   Dressing Assistance: Limited assistance     Functional Limitations Info  Sight, Hearing, Speech Sight Info: Adequate Hearing Info: Adequate Speech Info: Adequate    SPECIAL CARE FACTORS FREQUENCY  PT (By licensed PT), OT (By licensed OT)     PT Frequency: 5x/wk OT Frequency: 5x/wk            Contractures Contractures Info: Not present    Additional Factors Info  Code Status, Allergies Code Status Info: Full Allergies Info: cinnamon flavoring agent           Current Medications (03/27/2024):  This is the current hospital active medication list Current Facility-Administered Medications  Medication Dose Route Frequency Provider Last Rate Last Admin   acetaminophen (TYLENOL) tablet 325-650 mg  325-650 mg Oral Q6H PRN Kathryne Hitch, MD   650 mg at 03/26/24 1830   alum & mag hydroxide-simeth (MAALOX/MYLANTA) 200-200-20 MG/5ML suspension 30 mL  30 mL Oral Q4H PRN Kathryne Hitch, MD       ascorbic acid (VITAMIN C) tablet 1,000 mg  1,000 mg Oral BID Kathryne Hitch, MD   1,000 mg at 03/27/24 0815   aspirin chewable tablet 81 mg  81 mg Oral BID Kathryne Hitch, MD   81 mg at 03/27/24 0815   atorvastatin (LIPITOR) tablet 10 mg  10 mg Oral Daily Kathryne Hitch, MD   10 mg at 03/27/24  1610   ceFAZolin (ANCEF) IVPB 2g/100 mL premix  2 g Intravenous Q8H Kathryne Hitch, MD 200 mL/hr at 03/27/24 1214 2 g at 03/27/24 1214   chlorthalidone (HYGROTON) tablet 25 mg  25 mg Oral Daily Kathryne Hitch, MD   25 mg at 03/27/24 0816   diphenhydrAMINE (BENADRYL) 12.5 MG/5ML elixir 12.5-25 mg  12.5-25 mg Oral Q4H PRN Kathryne Hitch, MD       docusate sodium (COLACE) capsule 100 mg  100 mg Oral BID Kathryne Hitch, MD   100 mg at 03/27/24 0815   fenofibrate tablet 54 mg  54 mg Oral Daily Kathryne Hitch, MD   54 mg at 03/27/24 0816   fesoterodine (TOVIAZ) tablet 4 mg  4 mg Oral Daily Kathryne Hitch, MD   4 mg at 03/27/24 0815   HYDROmorphone (DILAUDID) injection 0.5-1 mg  0.5-1 mg Intravenous Q4H PRN Kathryne Hitch, MD   1 mg at 03/26/24 2024   loratadine (CLARITIN) tablet 10 mg  10 mg Oral Daily Kathryne Hitch, MD   10 mg at 03/27/24 0815   menthol-cetylpyridinium (CEPACOL) lozenge 3 mg  1 lozenge Oral PRN Kathryne Hitch, MD       Or   phenol (CHLORASEPTIC) mouth spray 1 spray  1 spray Mouth/Throat PRN Kathryne Hitch, MD       metFORMIN (GLUCOPHAGE-XR) 24 hr tablet 500 mg  500 mg Oral BID WC Kathryne Hitch, MD   500 mg at 03/27/24 0815   methocarbamol (ROBAXIN) tablet 500 mg  500 mg Oral Q6H PRN Kathryne Hitch, MD   500 mg at 03/26/24 1830   Or   methocarbamol (ROBAXIN) injection 500 mg  500 mg Intravenous Q6H PRN Kathryne Hitch, MD   500 mg at 03/24/24 1653   metoCLOPramide (REGLAN) tablet 5-10 mg  5-10 mg Oral Q8H PRN Kathryne Hitch, MD       Or   metoCLOPramide (REGLAN) injection 5-10 mg  5-10 mg Intravenous Q8H PRN Kathryne Hitch, MD       metoprolol tartrate (LOPRESSOR) tablet 100 mg  100 mg Oral BID Kathryne Hitch, MD   100 mg at 03/27/24 0816   ondansetron (ZOFRAN) tablet 4 mg  4 mg Oral Q6H PRN Kathryne Hitch, MD       Or   ondansetron [facility]) injection 4 mg  4 mg Intravenous Q6H PRN Kathryne Hitch, MD       oxyCODONE (Oxy IR/ROXICODONE) immediate release tablet 10-15 mg  10-15 mg Oral Q4H PRN Kathryne Hitch, MD   10 mg at 03/26/24 9604   oxyCODONE (Oxy IR/ROXICODONE) immediate release tablet 5-10 mg  5-10 mg Oral Q4H PRN Kathryne Hitch, MD   5 mg at 03/27/24 1216   pantoprazole (PROTONIX) EC tablet 40 mg  40 mg Oral Daily Kathryne Hitch, MD   40 mg at 03/27/24 0816   polyethylene glycol (MIRALAX / GLYCOLAX) packet 17 g  17 g Oral Daily PRN  Kirtland Bouchard, PA-C   17 g at 03/26/24 1246   tamsulosin (FLOMAX) capsule 0.4 mg  0.4 mg Oral Daily Kathryne Hitch, MD   0.4 mg at 03/27/24 5409     Discharge Medications: Please see discharge summary for a list of discharge medications.  Relevant Imaging Results:  Relevant Lab Results:   Additional Information SSN [id number]  Amada Jupiter, LCSW

## 2024-03-27 NOTE — Consult Note (Addendum)
 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  - Monitor CBC, CMP and CPK - Post op care per Orthopedics - Universal/standard isolation precautions    Rest of the management as per the primary team. Please call with questions or concerns.  Thank you for the consult  __________________________________________________________________________________________________________ HPI and Hospital Course: 78 Y O male with prior h/o HLD, HTN, BPH/Prostate ca, OA, DM2, B/L Total knee arthroplasty ( rt 04/26/2020 and left 09/25/2022) whois directly admitted for elective OR for concerns of left knee PJI. Reports having recurrent swelling of the left knee in the last few months.  He was seen in Ortho office for the same and on 3/20 fluid was aspirated from Left knee with 49K WBC, no growth in cultures. WBC with no leukcoytosis but ESR in 40s and CRP was 7. Denies having fevers, chills and sweats. Denies cough, chest pain or SOB. Denies N/V/abdominal pain and diarrhea. He however reports being on Nitrofurantoin since November for his urinary infection suppression.   4/4 Incision and drainage with exploration and open arthrotomy and synovectomy left knee; left knee  poly liner exchange.  Findings: Large cloudy joint effusion with questionable purulent material; Gram stain and cultures of the fluid and soft tissues pending. Cx no orgnaisms in gram stain, staph epidermidis.    ROS: General- Denies fever, chills, loss of appetite and loss of weight HEENT - Denies headache, blurry vision, neck pain, sinus pain Chest - Denies any chest pain, SOB or cough CVS- Denies any dizziness/lightheadedness, syncopal attacks, palpitations Abdomen- Denies any nausea, vomiting, abdominal pain, hematochezia and diarrhea Neuro - Denies any weakness, numbness, tingling sensation Psych - Denies any changes in mood irritability or depressive symptoms GU- Denies any burning, dysuria, hematuria or increased frequency of urination Skin - denies any rashes/lesions MSK - Left knee soreness   Past Medical History:  Diagnosis Date   BPH with obstruction/lower urinary tract symptoms    Hyperlipidemia, mixed    Hypertension    Limited active range of motion (AROM) of cervical spine on rotation    Malignant neoplasm prostate [facility]) 08/2023   urologist-- dr stoneking/  radiation oncologist-- dr Kathrynn Running;  dx 09/ 2024, gleason 4+5, psa 97.2,  vol 88.5   OA (osteoarthritis)    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   Type 2 diabetes  mellitus (HCC)    followed by pcp   (11-10-2023  pt stated does not check blood sugar)   Wears glasses    Past Surgical History:  Procedure Laterality Date   ANTERIOR FUSION CERVICAL SPINE  1990   Thinks it was C-3-4  done here at [facility]   BILATERAL CARPAL TUNNEL RELEASE Bilateral    1990s   COLONOSCOPY  2011   EXCISIONAL TOTAL KNEE ARTHROPLASTY WITH ANTIBIOTIC SPACERS Left 03/24/2024   Procedure: LEFT KNEE SYNOVECTOMY, IRRIGATION AND DEBRIDEMENT WITH POLY EXCHANGE;  Surgeon: Kathryne Hitch, MD;  Location: [facility];  Service: Orthopedics;  Laterality: Left;   GOLD SEED IMPLANT N/A 11/23/2023    Procedure: GOLD SEED IMPLANT;  Surgeon: Milderd Meager., MD;  Location: [facility];  Service: Urology;  Laterality: N/A;   SPACE OAR INSTILLATION N/A 11/23/2023   Procedure: SPACE OAR INSTILLATION;  Surgeon: Milderd Meager., MD;  Location: [facility];  Service: Urology;  Laterality: N/A;   TOTAL KNEE ARTHROPLASTY Right 04/26/2020   Procedure: RIGHT TOTAL KNEE ARTHROPLASTY;  Surgeon: Kathryne Hitch, MD;  Location: [facility];  Service: Orthopedics;  Laterality: Right;   TOTAL KNEE ARTHROPLASTY Left 09/25/2022   Procedure: LEFT TOTAL KNEE ARTHROPLASTY;  Surgeon: Kathryne Hitch, MD;  Location: [facility];  Service: Orthopedics;  Laterality: Left;    Scheduled Meds:  ascorbic acid  1,000 mg Oral BID   aspirin  81 mg Oral BID   atorvastatin  10 mg Oral Daily   chlorthalidone  25 mg Oral Daily   docusate sodium  100 mg Oral BID   fenofibrate  54 mg Oral Daily   fesoterodine  4 mg Oral Daily   loratadine  10 mg Oral Daily   metFORMIN  500 mg Oral BID WC   metoprolol tartrate  100 mg Oral BID   pantoprazole  40 mg Oral Daily   tamsulosin  0.4 mg Oral Daily   Continuous Infusions:   ceFAZolin (ANCEF) IV 2 g (03/27/24 0400)   PRN Meds:.acetaminophen, alum & mag hydroxide-simeth, diphenhydrAMINE, HYDROmorphone (DILAUDID) injection, menthol-cetylpyridinium **OR** phenol, methocarbamol **OR** methocarbamol (ROBAXIN) injection, metoCLOPramide **OR** metoCLOPramide (REGLAN) injection, ondansetron **OR** ondansetron (ZOFRAN) IV, oxyCODONE, oxyCODONE, polyethylene glycol  Allergies  Allergen Reactions   Cinnamon Flavoring Agent (Non-Screening) Other (See Comments)    Cinnamon chewing gum caused blisters on tongue   Social History   Socioeconomic History   Marital status: Single    Spouse name: Not on file   Number of children: 0   Years of education: Not on file   Highest education level: Not on file  Occupational History   Occupation:  retired  Tobacco Use   Smoking status: Never   Smokeless tobacco: Never  Vaping Use   Vaping status: Never Used  Substance and Sexual Activity   Alcohol use: No   Drug use: Never   Sexual activity: Not Currently  Other Topics Concern   Not on file  Social History Narrative   Single - lives alone - no children - no family nearby   Social Drivers of Corporate investment banker Strain: Low Risk  (07/19/2023)   Overall Financial Resource Strain (CARDIA)    Difficulty of Paying Living Expenses: Not hard at all  Food Insecurity: Food Insecurity Present (03/24/2024)   Hunger Vital Sign    Worried About Radiation protection practitioner of Food in the Last Year: Sometimes true    Ran Out of Food in the Last Year: Sometimes true  Transportation Needs: No Transportation Needs (03/24/2024)   PRAPARE - Administrator, Civil Service (Medical): No    Lack of Transportation (Non-Medical): No  Physical Activity: Inactive (07/19/2023)   Exercise Vital Sign    Days of Exercise per Week: 0 days    Minutes of Exercise per Session: 0 min  Stress: No Stress Concern Present (07/19/2023)   [employer] of Occupational Health - Occupational Stress Questionnaire    Feeling of Stress : Only a little  Social Connections: Socially Isolated (03/24/2024)   Social Connection and Isolation Panel [NHANES]    Frequency of Communication with Friends and Family: More than three times a week    Frequency of Social Gatherings with Friends and Family: Once a week    Attends Religious Services: Never    Database administrator or Organizations: No    Attends Banker Meetings: Never    Marital Status: Divorced  Catering manager Violence: Not At Risk (03/24/2024)   Humiliation, Afraid, Rape, and Kick questionnaire    Fear of Current or Ex-Partner: No    Emotionally Abused: No    Physically Abused: No    Sexually Abused: No   History reviewed. No pertinent family history.  Vitals BP 124/60   Pulse 75   Temp  97.9 F (36.6 C) (Oral)   Resp 16   Ht 5\' 9"  (1.753 m)   Wt 85 kg   SpO2 96%   BMI 27.67 kg/m    Physical Exam Constitutional:  elderly male sitting at the edge of the bed, non toxic appearing, not in acute distress    Comments: HEENT wnl  Cardiovascular:     Rate and Rhythm: Normal rate and regular rhythm.     Heart sounds: s1s2  Pulmonary:     Effort: Pulmonary effort is normal.     Comments: Normal breath sounds   Abdominal:     Palpations: Abdomen is soft.     Tenderness: non distended and non tender  Musculoskeletal:        General: No swelling or tenderness in peripheral joints, Left knee covered with a bandage with mild swelling expected in post op state. Rt TKA with no concerns for infection  Skin:    Comments: no rashes  Neurological:     General: awake, alert and oriented, non focal exam   Psychiatric:        Mood and Affect: Mood normal.    Pertinent Microbiology Results for orders placed or performed during the hospital encounter of 03/24/24  Aerobic/Anaerobic Culture w Gram Stain (surgical/deep wound)     Status: None (Preliminary result)   Collection Time: 03/24/24  3:08 PM   Specimen: Joint, Other; Body Fluid  Result Value Ref Range Status   Specimen Description   Final    JOINT FLUID Performed at [facility], [address] [place], [place] [address]    Special Requests SWABS  Final   Gram Stain   Final    ABUNDANT WBC PRESENT, PREDOMINANTLY PMN NO ORGANISMS SEEN Performed at [facility], [address] [place], [place] [address]    Culture   Final    CULTURE REINCUBATED FOR BETTER GROWTH NO ANAEROBES ISOLATED; CULTURE IN PROGRESS FOR 5 DAYS    Report Status PENDING  Incomplete  Aerobic/Anaerobic Culture w Gram Stain (surgical/deep wound)     Status: None (Preliminary result)   Collection Time: 03/24/24  3:20 PM   Specimen: Soft Tissue, Other  Result Value Ref Range Status   Specimen Description   Final     TISSUE LEFT KNEE Performed at [facility], [address], [place], [place] [address]    Special Requests   Final    NONE Performed at [facility], [address], [place], [place] [address]    Gram Stain   Final    NO WBC SEEN NO ORGANISMS SEEN Performed at [facility], [address], [place], [place] [address]    Culture   Final    CULTURE REINCUBATED FOR BETTER GROWTH NO ANAEROBES ISOLATED; CULTURE IN PROGRESS FOR 5 DAYS    Report Status PENDING  Incomplete   Pertinent Lab seen by me:    Latest Ref Rng & Units 03/26/2024    3:14 AM 03/25/2024    3:02 AM 03/20/2024   10:13 AM  CBC  WBC 4.0 - 10.5 K/uL 6.4  8.3  7.1   Hemoglobin 13.0 - 17.0 g/dL 8.6  9.0  9.8   Hematocrit 39.0 - 52.0 % 26.1  27.5  29.5   Platelets 150 - 400 K/uL 374  383  403    '    Latest Ref Rng & Units 03/25/2024    3:02 AM 03/20/2024   10:13 AM 02/14/2024    2:38 PM  CMP  Glucose 70 - 99 mg/dL 914  782  956   BUN 8 - 23 mg/dL 21  21  17    Creatinine 0.61 - 1.24 mg/dL 2.13  0.86  5.78   Sodium 135 - 145 mmol/L 136  136  139   Potassium 3.5 - 5.1 mmol/L 3.4  3.4  3.5   Chloride 98 - 111 mmol/L 101  100  96   CO2 22 - 32 mmol/L 23  25  28    Calcium 8.9 - 10.3 mg/dL 9.0  9.4  46.9     Pertinent Imagings/Other Imagings Plain films and CT images have been personally visualized and interpreted; radiology reports have been reviewed. Decision making incorporated into the Impression / Recommendations.  [place] EKG SITE RITE Result Date: 03/27/2024 If Site Rite image not attached, placement could not be confirmed due to current cardiac rhythm.  XR Knee 1-2 Views Left Result Date: 02/28/2024 Left knee 2 views: Well-seated total knee arthroplasty without any evidence of loosening or hardware failure.  No acute fractures.  Knee is well located.    I have personally spent 87 minutes involved in face-to-face and non-face-to-face activities for this patient on the day of the visit.  Professional time spent includes the following activities: Preparing to see the patient (review of tests), Obtaining and/or reviewing separately obtained history (admission/discharge record), Performing a medically appropriate examination and/or evaluation , Ordering medications/tests/procedures, referring and communicating with other health care professionals, Documenting clinical information in the EMR, Independently interpreting results (not separately reported), Communicating results to the patient/family/caregiver, Counseling and educating the patient/family/caregiver and Care coordination (not separately reported).  Electronically signed by:   Plan d/w requesting provider as well as ID pharm D  Of note, portions of this note may have been created with voice recognition software. While this note has been edited for accuracy, occasional wrong-word or 'sound-a-like' substitutions may have occurred due to the inherent limitations of voice recognition software.   Odette Fraction, MD Infectious Disease Physician [facility] Pager: [phone]

## 2024-03-27 NOTE — Progress Notes (Signed)
 Patient ID: Richard Harding, male   DOB: 08/16/1946, 78 y.o.   MRN: [id number] The patient is awake and alert this morning.  His vital signs are stable.  His left operative knee is stable.  The cultures thus far have not grown anything out of his knee but obviously clinically on inspection there was thick fluid in the knee.  We will order a PICC line to be placed today.  The transitional care team has been consulted about disposition in terms of skilled nursing facility stay following this hospitalization.  We will also consult the infectious disease service.

## 2024-03-27 NOTE — Progress Notes (Signed)
 Peripherally Inserted Central Catheter Placement  The IV Nurse has discussed with the patient and/or persons authorized to consent for the patient, the purpose of this procedure and the potential benefits and risks involved with this procedure.  The benefits include less needle sticks, lab draws from the catheter, and the patient may be discharged home with the catheter. Risks include, but not limited to, infection, bleeding, blood clot (thrombus formation), and puncture of an artery; nerve damage and irregular heartbeat and possibility to perform a PICC exchange if needed/ordered by physician.  Alternatives to this procedure were also discussed.  Bard Power PICC patient education guide, fact sheet on infection prevention and patient information card has been provided to patient /or left at bedside.    PICC Placement Documentation  PICC Single Lumen 03/27/24 Right Basilic 41 cm 0 cm (Active)       Elenore Paddy 03/27/2024, 6:35 PM

## 2024-03-27 NOTE — Progress Notes (Signed)
 Physical Therapy Treatment Patient Details Name: Richard Harding MRN: [id number] DOB: [birth year] Today's Date: 03/27/2024   History of Present Illness Pt admitted with L knee synovitis with effusion and questionable infected TKR and now s/p I&D with poly exchange.  Ot with hx of bil TKR and prostate CA    PT Comments  POD # 3 Assisted with getting OOB to amb to the bathroom then in hallway.  Then returned to room to perform some TE's following HEP handout.  Instructed on proper tech, freq as well as use of ICE.   Pt awaiting PICC placement.  Pt wanting SNF.    If plan is discharge home, recommend the following: A little help with walking and/or transfers   Can travel by private vehicle        Equipment Recommendations       Recommendations for Other Services       Precautions / Restrictions Precautions Precautions: Fall Precaution/Restrictions Comments: no pillow under knee Restrictions Weight Bearing Restrictions Per Provider Order: No LLE Weight Bearing Per Provider Order: Weight bearing as tolerated     Mobility  Bed Mobility Overal bed mobility: Needs Assistance Bed Mobility: Supine to Sit, Sit to Supine     Supine to sit: Supervision Sit to supine: Supervision   General bed mobility comments: INcreased time with Supervision for safety and using belt to self assist L LE back onto bed    Transfers Overall transfer level: Needs assistance Equipment used: Rolling walker (2 wheels) Transfers: Sit to/from Stand Sit to Stand: Supervision           General transfer comment: cues for LE management and use of UEs to self assist with increased time    Ambulation/Gait Ambulation/Gait assistance: Supervision Gait Distance (Feet): 75 Feet Assistive device: Rolling walker (2 wheels) Gait Pattern/deviations: Step-to pattern, Decreased step length - right, Decreased step length - left, Shuffle, Trunk flexed Gait velocity: decr     General Gait Details: cues  for sequence, posture and positon from [employer] Mobility     Tilt Bed    Modified Rankin (Stroke Patients Only)       Balance                                            Communication Communication Communication: No apparent difficulties  Cognition Arousal: Alert Behavior During Therapy: WFL for tasks assessed/performed   PT - Cognitive impairments: No apparent impairments                       PT - Cognition Comments: AxO x 3 pleasant and motivated. Following commands: Intact      Cueing    Exercises  Total Knee Replacement TE's following HEP handout 10 reps B LE ankle pumps 05 reps towel squeezes 05 reps knee presses 05 reps heel slides  05 reps SAQ's 05 reps SLR's 05 reps ABD Educated on use of gait belt to assist with TE's Followed by ICE     General Comments        Pertinent Vitals/Pain Pain Assessment Pain Assessment: Faces Faces Pain Scale: Hurts little more Pain Location: L knee with TE's Pain Descriptors / Indicators: Aching, Sore, Operative site guarding, Tender, Burning Pain Intervention(s): Monitored during session, Premedicated before session, Repositioned, Ice  applied    Home Living                          Prior Function            PT Goals (current goals can now be found in the care plan section) Progress towards PT goals: Progressing toward goals    Frequency    7X/week      PT Plan      Co-evaluation              AM-PAC PT "6 Clicks" Mobility   Outcome Measure  Help needed turning from your back to your side while in a flat bed without using bedrails?: None Help needed moving from lying on your back to sitting on the side of a flat bed without using bedrails?: None Help needed moving to and from a bed to a chair (including a wheelchair)?: None Help needed standing up from a chair using your arms (e.g., wheelchair or bedside chair)?:  None Help needed to walk in hospital room?: A Little Help needed climbing 3-5 steps with a railing? : A Lot 6 Click Score: 21    End of Session Equipment Utilized During Treatment: Gait belt Activity Tolerance: No increased pain Patient left: in bed;with call bell/phone within reach;with bed alarm set;Other (comment) (ICE MAN) Nurse Communication: Mobility status PT Visit Diagnosis: Difficulty in walking, not elsewhere classified (R26.2)     Time: 1534-1600 PT Time Calculation (min) (ACUTE ONLY): 26 min  Charges:    $Gait Training: 8-22 mins $Therapeutic Exercise: 8-22 mins PT General Charges $$ ACUTE PT VISIT: 1 Visit                     Felecia Shelling  PTA Acute  Rehabilitation Services Office M-F          [phone]

## 2024-03-28 DIAGNOSIS — M65962 Unspecified synovitis and tenosynovitis, left lower leg: Secondary | ICD-10-CM | POA: Diagnosis not present

## 2024-03-28 DIAGNOSIS — B957 Other staphylococcus as the cause of diseases classified elsewhere: Secondary | ICD-10-CM | POA: Diagnosis not present

## 2024-03-28 DIAGNOSIS — T8454XA Infection and inflammatory reaction due to internal left knee prosthesis, initial encounter: Secondary | ICD-10-CM | POA: Diagnosis not present

## 2024-03-28 LAB — CK: Total CK: 65 U/L (ref 49–397)

## 2024-03-28 MED ORDER — RIFAMPIN 300 MG PO CAPS: 300.0000 mg | ORAL_CAPSULE | Freq: Two times a day (BID) | ORAL | 0 refills | Status: DC

## 2024-03-28 MED ORDER — ASPIRIN 81 MG PO CHEW: 81.0000 mg | CHEWABLE_TABLET | Freq: Two times a day (BID) | ORAL | 0 refills | Status: AC

## 2024-03-28 MED ORDER — CEFAZOLIN SODIUM-DEXTROSE 2-4 GM/100ML-% IV SOLN: 2.0000 g | Freq: Three times a day (TID) | INTRAVENOUS | 1 refills | Status: DC

## 2024-03-28 MED ORDER — CEFAZOLIN IV (FOR PTA / DISCHARGE USE ONLY): 2.0000 g | Freq: Three times a day (TID) | INTRAVENOUS | 0 refills | Status: DC

## 2024-03-28 MED ORDER — OXYCODONE HCL 5 MG PO TABS
5.0000 mg | ORAL_TABLET | Freq: Four times a day (QID) | ORAL | 0 refills | Status: DC | PRN
Start: 2024-03-28 — End: 2024-05-17

## 2024-03-28 MED ORDER — ATORVASTATIN CALCIUM 10 MG PO TABS
10.0000 mg | ORAL_TABLET | Freq: Every day | ORAL | Status: DC
  Administered 2024-03-29: 10 mg via ORAL
  Filled 2024-03-28: qty 1

## 2024-03-28 MED ORDER — RIFAMPIN 300 MG PO CAPS
300.0000 mg | ORAL_CAPSULE | Freq: Two times a day (BID) | ORAL | Status: DC
  Administered 2024-03-28 – 2024-03-29 (×2): 300 mg via ORAL
  Filled 2024-03-28 (×3): qty 1

## 2024-03-28 MED ORDER — RIFAMPIN 300 MG PO CAPS: 300.0000 mg | ORAL_CAPSULE | Freq: Two times a day (BID) | ORAL | Status: DC

## 2024-03-28 MED ORDER — METHOCARBAMOL 500 MG PO TABS: 500.0000 mg | ORAL_TABLET | Freq: Four times a day (QID) | ORAL | 0 refills | Status: DC | PRN

## 2024-03-28 NOTE — Plan of Care (Signed)
  Problem: Skin Integrity: Goal: Risk for impaired skin integrity will decrease Outcome: Progressing   Problem: Activity: Goal: Range of joint motion will improve Outcome: Progressing   Problem: Pain Management: Goal: Pain level will decrease with appropriate interventions Outcome: Progressing

## 2024-03-28 NOTE — Progress Notes (Signed)
 PHARMACY CONSULT NOTE FOR:  OUTPATIENT  PARENTERAL ANTIBIOTIC THERAPY (OPAT)  Indication: Left Knee PJI  Regimen: Cefazolin 2 gm IV Q 8 hours + Rifampin 300 mg BID  End date: 05/05/24  IV antibiotic discharge orders are pended. To discharging provider:  please sign these orders via discharge navigator,  Select New Orders & click on the button choice - Manage This Unsigned Work.     Thank you for allowing pharmacy to be a part of this patient's care.  Sharin Mons, PharmD, BCPS, BCIDP Infectious Diseases Clinical Pharmacist Phone: [phone] 03/28/2024, 2:50 PM

## 2024-03-28 NOTE — Progress Notes (Signed)
 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 Reports having recurrent swelling of the left knee in the last few months.  He was seen in Ortho office for the same and on 3/20 fluid was aspirated from Left knee with 49K WBC, no growth in cultures. WBC with no leukcoytosis but ESR in 40s and CRP was 7. He reports being on Nitrofurantoin since November for his urinary infection suppression.   # Left knee PJI  4/4 I&D with exploration and open arthrotomy and synovectomy of left knee, left knee polyliner exchange.  Findings-large cloudy joint effusion with questionable purulent material, Gram stain and culture of the fluid and soft tissues with MSSE   Recommendations Continue cefazolin to complete 6 weeks course. Will add Rifampin while on IV abtx to help biofilm. No C/Is and reviewed side effects of antibiotics Monitor CBC and CMP on abtx OPAT as below Post op care per Orthopedics ID will so, recall back with questions or concerns  OPAT  Diagnosis: PJI   Culture Result: MSSE  Allergies  Allergen Reactions   Cinnamon Flavoring Agent (Non-Screening) Other (See Comments)    Cinnamon chewing gum caused blisters on tongue    OPAT Orders Discharge antibiotics to be given via PICC  line Discharge antibiotics: cefazolin per pharmacy Per pharmacy protocol  Aim for Vancomycin trough 15-20 or AUC 400-550 (unless otherwise indicated) Duration: 6 weeks End Date: 05/05/24  [facility] Per Protocol:  Home health RN for IV administration and teaching; PICC line care and labs.    Labs weekly while on IV antibiotics: X__ CBC with differential __ BMP X__ CMP X__ CRP __ ESR __ Vancomycin trough X__ CK  __ Please pull PIC at completion of IV antibiotics X__ Please leave PIC in place until doctor has seen patient or been notified  Fax weekly labs to [phone]  Clinic Follow Up Appt: 4/22  @ 2: 45 pm    Rest of the management as per the primary team. Thank you for the consult. Please page with pertinent questions or concerns.  ______________________________________________________________________ Subjective patient seen and examined at the bedside. No concerns. Discussed about abtx plan.    Vitals BP 122/71 (BP Location: Left Arm)   Pulse 74   Temp 98.1 F (36.7 C) (Oral)   Resp 16   Ht 5\' 9"  (1.753 m)   Wt 85 kg   SpO2 100%   BMI 27.67 kg/m     Physical Exam Constitutional: Elderly male sitting in the recliner, not in acute distress    Comments:   Cardiovascular:     Rate and Rhythm: Normal rate and regular rhythm.     Heart sounds:   Pulmonary:     Effort: Pulmonary effort is normal.     Comments:   Abdominal:     Palpations: Abdomen is soft.     Tenderness:  Musculoskeletal:    Left knee wrapped in an immobilizer    General: No concerns at other peripheral joints  Skin:    Comments: No rashes, PICC in rt arm with no concerns   Neurological:     General: Awake, alert and oriented, grossly nonfocal  Psychiatric:        Mood and Affect: Mood normal.   Pertinent Microbiology Results for orders placed or performed during the hospital encounter of 03/24/24  Aerobic/Anaerobic Culture w Gram Stain (surgical/deep wound)     Status:  None (Preliminary result)   Collection Time: 03/24/24  3:08 PM   Specimen: Joint, Other; Body Fluid  Result Value Ref Range Status   Specimen Description KNEE LEFT  Final   Special Requests SWABS  Final   Gram Stain   Final    ABUNDANT WBC PRESENT, PREDOMINANTLY PMN NO ORGANISMS SEEN Performed at [facility], [address], [place], [place] [address]    Culture   Final    FEW STAPHYLOCOCCUS EPIDERMIDIS NO ANAEROBES ISOLATED; CULTURE IN PROGRESS FOR 5 DAYS    Report Status PENDING  Incomplete   Organism ID, Bacteria STAPHYLOCOCCUS EPIDERMIDIS  Final      Susceptibility   Staphylococcus epidermidis - MIC*    CIPROFLOXACIN <=0.5 SENSITIVE Sensitive     ERYTHROMYCIN <=0.25 SENSITIVE Sensitive     GENTAMICIN <=0.5 SENSITIVE Sensitive     OXACILLIN <=0.25 SENSITIVE Sensitive     TETRACYCLINE <=1 SENSITIVE Sensitive     VANCOMYCIN 1 SENSITIVE Sensitive     TRIMETH/SULFA <=10 SENSITIVE Sensitive     CLINDAMYCIN <=0.25 SENSITIVE Sensitive     RIFAMPIN <=0.5 SENSITIVE Sensitive     Inducible Clindamycin NEGATIVE Sensitive     * FEW STAPHYLOCOCCUS EPIDERMIDIS  Aerobic/Anaerobic Culture w Gram Stain (surgical/deep wound)     Status: None (Preliminary result)   Collection Time: 03/24/24  3:20 PM   Specimen: Soft Tissue, Other  Result Value Ref Range Status   Specimen Description TISSUE LEFT KNEE  Final   Special Requests NONE  Final   Gram Stain NO WBC SEEN NO ORGANISMS SEEN   Final   Culture   Final    RARE STAPHYLOCOCCUS EPIDERMIDIS NO ANAEROBES ISOLATED; CULTURE IN PROGRESS FOR 5 DAYS    Report Status PENDING  Incomplete   Organism ID, Bacteria STAPHYLOCOCCUS EPIDERMIDIS  Final      Susceptibility   Staphylococcus epidermidis - MIC*    CIPROFLOXACIN <=0.5 SENSITIVE Sensitive     ERYTHROMYCIN <=0.25 SENSITIVE Sensitive     GENTAMICIN <=0.5 SENSITIVE Sensitive     OXACILLIN <=0.25 SENSITIVE Sensitive     TETRACYCLINE <=1 SENSITIVE Sensitive     VANCOMYCIN 1 SENSITIVE  Sensitive     TRIMETH/SULFA <=10 SENSITIVE Sensitive     CLINDAMYCIN <=0.25 SENSITIVE Sensitive     RIFAMPIN <=0.5 SENSITIVE Sensitive     Inducible Clindamycin Value in next row Sensitive      NEGATIVEPerformed at [facility], [address], [place], [place] [address]    * RARE STAPHYLOCOCCUS EPIDERMIDIS    Pertinent Lab.    Latest Ref Rng & Units 03/26/2024    3:14 AM 03/25/2024    3:02 AM 03/20/2024   10:13 AM  CBC  WBC 4.0 - 10.5 K/uL 6.4  8.3  7.1   Hemoglobin 13.0 - 17.0 g/dL 8.6  9.0  9.8   Hematocrit 39.0 - 52.0 % 26.1  27.5  29.5   Platelets 150 - 400 K/uL 374  383  403       Latest Ref Rng & Units 03/25/2024    3:02 AM 03/20/2024   10:13 AM 02/14/2024    2:38 PM  CMP  Glucose 70 - 99 mg/dL 703  500  938   BUN 8 - 23 mg/dL 21  21  17    Creatinine 0.61 - 1.24 mg/dL 1.82  9.93  7.16   Sodium 135 - 145 mmol/L 136  136  139   Potassium 3.5 - 5.1 mmol/L 3.4  3.4  3.5   Chloride 98 - 111 mmol/L 101  100  96   CO2 22 - 32 mmol/L 23  25  28    Calcium 8.9 - 10.3 mg/dL 9.0  9.4  96.7      Pertinent Imaging today Plain films and CT images have been personally visualized and interpreted; radiology reports have been reviewed. Decision making incorporated into the Impression /   No results found.   I have personally spent 50 minutes involved in face-to-face and non-face-to-face activities for this patient on the day of the visit. Professional time spent includes the following activities: Preparing to see the patient (review of tests), Obtaining and/or reviewing separately obtained history (admission/discharge record), Performing a medically appropriate examination and/or evaluation , Ordering medications/tests/procedures, referring and communicating with other health care professionals, Documenting clinical information in the EMR, Independently interpreting results (not separately reported), Communicating results to the patient/family/caregiver, Counseling and educating the  patient/family/caregiver and Care coordination (not separately reported).   Plan d/w requesting provider as well as ID pharm D  Of note, portions of this note may have been created with voice recognition software. While this note has been edited for accuracy, occasional wrong-word or 'sound-a-like' substitutions may have occurred due to the inherent limitations of voice recognition software.   Electronically signed by:   Odette Fraction, MD Infectious Disease Physician [facility] Pager: [phone]

## 2024-03-28 NOTE — Progress Notes (Signed)
 Patient ID: Richard Harding, male   DOB: February 14, 1946, 78 y.o.   MRN: [id number] The patient is awake and alert this morning.  His vital signs are stable.  I did change the dressing on his left knee and his incision is clean and dry.  I certainly absolutely appreciate the ID consult by Dr. Elinor Parkinson.  The cultures thus far have grown Staph epidermidis.  A PICC line has now been placed.  The transitional care team is working on finding skilled nursing placement for the patient.  Once a bed is available we can put in discharge orders and prescriptions on his chart.  We will check on him later today after our surgeries this morning at [facility].

## 2024-03-28 NOTE — Progress Notes (Signed)
 Physical Therapy Treatment Patient Details Name: Richard Harding MRN: [id number] DOB: 12/03/1946 Today's Date: 03/28/2024   History of Present Illness Pt admitted with L knee synovitis with effusion and questionable infected TKR and now s/p I&D with poly exchange.  Ot with hx of bil TKR and prostate CA    PT Comments  POD # 4 Pt received his PICC line yesterday. Caught Pt amb out of bathroom on his own.  Apparently he got himself OOB and walked to the bathroom on his own as well.  Educated on calling for assistance for safety.   Assisted with amb in hallway.  Then returned to room to perform some TE's following HEP handout.  Instructed on proper tech, freq as well as use of ICE.       If plan is discharge home, recommend the following:     Can travel by private vehicle        Equipment Recommendations  None recommended by PT    Recommendations for Other Services       Precautions / Restrictions Precautions Precautions: Fall Precaution/Restrictions Comments: no pillow under knee Restrictions Weight Bearing Restrictions Per Provider Order: No LLE Weight Bearing Per Provider Order: Weight bearing as tolerated     Mobility  Bed Mobility               General bed mobility comments: Pt OOB ib bathroom    Transfers Overall transfer level: Needs assistance Equipment used: Rolling walker (2 wheels) Transfers: Sit to/from Stand Sit to Stand: Supervision           General transfer comment: cues for LE management and use of UEs to self assist with increased time    Ambulation/Gait   Gait Distance (Feet): 82 Feet   Gait Pattern/deviations: Step-to pattern, Decreased step length - right, Decreased step length - left, Shuffle, Trunk flexed Gait velocity: decr     General Gait Details: cauht Pt amb self out of bathrrom.  Then assisted with amb a greater distance in the hallway.  Reports 3/10 pain with amb.   Stairs             Wheelchair Mobility      Tilt Bed    Modified Rankin (Stroke Patients Only)       Balance                                            Communication    Cognition       PT - Cognitive impairments: No apparent impairments                       PT - Cognition Comments: AxO x 3 pleasant and motivated. Following commands: Intact      Cueing    Exercises  Total Knee Replacement TE's following HEP handout 10 reps B LE ankle pumps 05 reps towel squeezes 05 reps knee presses 05 reps heel slides  05 reps SAQ's 05 reps SLR's 05 reps ABD Educated on use of gait belt to assist with TE's Followed by ICE     General Comments        Pertinent Vitals/Pain Pain Assessment Pain Assessment: 0-10 Pain Score: 5  Pain Location: L knee with TE's Pain Descriptors / Indicators: Aching, Sore, Operative site guarding, Tender, Burning Pain Intervention(s): Monitored during session, Premedicated before session, Repositioned, Ice applied  Home Living                          Prior Function            PT Goals (current goals can now be found in the care plan section) Progress towards PT goals: Progressing toward goals    Frequency    7X/week      PT Plan      Co-evaluation              AM-PAC PT "6 Clicks" Mobility   Outcome Measure  Help needed turning from your back to your side while in a flat bed without using bedrails?: None Help needed moving from lying on your back to sitting on the side of a flat bed without using bedrails?: None Help needed moving to and from a bed to a chair (including a wheelchair)?: None Help needed standing up from a chair using your arms (e.g., wheelchair or bedside chair)?: None Help needed to walk in hospital room?: A Little Help needed climbing 3-5 steps with a railing? : A Little 6 Click Score: 22    End of Session Equipment Utilized During Treatment: Gait belt Activity Tolerance: No increased pain Patient  left: in chair;with call bell/phone within reach Nurse Communication: Mobility status PT Visit Diagnosis: Difficulty in walking, not elsewhere classified (R26.2)     Time: 1610-9604 PT Time Calculation (min) (ACUTE ONLY): 24 min  Charges:    $Gait Training: 8-22 mins $Therapeutic Exercise: 8-22 mins PT General Charges $$ ACUTE PT VISIT: 1 Visit                     Felecia Shelling  PTA Acute  Rehabilitation Services Office M-F          [phone]

## 2024-03-28 NOTE — Plan of Care (Signed)
  Problem: Education: Goal: Knowledge of General Education information will improve Description: Including pain rating scale, medication(s)/side effects and non-pharmacologic comfort measures Outcome: Adequate for Discharge   Problem: Health Behavior/Discharge Planning: Goal: Ability to manage health-related needs will improve Outcome: Adequate for Discharge   Problem: Clinical Measurements: Goal: Ability to maintain clinical measurements within normal limits will improve Outcome: Adequate for Discharge Goal: Will remain free from infection Outcome: Adequate for Discharge Goal: Diagnostic test results will improve Outcome: Adequate for Discharge Goal: Respiratory complications will improve Outcome: Completed/Met Goal: Cardiovascular complication will be avoided Outcome: Completed/Met   Problem: Activity: Goal: Risk for activity intolerance will decrease Outcome: Adequate for Discharge   Problem: Coping: Goal: Level of anxiety will decrease Outcome: Adequate for Discharge   Problem: Elimination: Goal: Will not experience complications related to bowel motility Outcome: Progressing   Problem: Pain Managment: Goal: General experience of comfort will improve and/or be controlled Outcome: Adequate for Discharge   Problem: Safety: Goal: Ability to remain free from injury will improve Outcome: Completed/Met   Problem: Skin Integrity: Goal: Risk for impaired skin integrity will decrease Outcome: Completed/Met   Problem: Education: Goal: Knowledge of the prescribed therapeutic regimen will improve Outcome: Adequate for Discharge   Problem: Activity: Goal: Ability to avoid complications of mobility impairment will improve Outcome: Adequate for Discharge Goal: Range of joint motion will improve Outcome: Adequate for Discharge   Problem: Clinical Measurements: Goal: Postoperative complications will be avoided or minimized Outcome: Adequate for Discharge   Problem: Pain  Management: Goal: Pain level will decrease with appropriate interventions Outcome: Completed/Met   Problem: Skin Integrity: Goal: Will show signs of wound healing Outcome: Adequate for Discharge

## 2024-03-28 NOTE — TOC Progression Note (Addendum)
 Transition of Care [facility]) - Progression Note    Patient Details  Name: Richard Harding MRN: [id number] Date of Birth: 27-Feb-1946  Transition of Care [facility]) CM/SW Contact  Amada Jupiter, LCSW Phone Number: 03/28/2024, 2:08 PM  Clinical Narrative:     ADDENDUM: have received insurance authorization.  Pt and MD aware and will plan transfer to SNF tomorrow.   Have reviewed SNF bed offers with pt and he has accepted bed with [facility].  Will begin insurance authorization.  Expected Discharge Plan: Skilled Nursing Facility Barriers to Discharge: Continued Medical Work up, English as a second language teacher, SNF Pending bed offer  Expected Discharge Plan and Services In-house Referral: Clinical Social Work   Post Acute Care Choice: Skilled Nursing Facility Living arrangements for the past 2 months: Single Family Home                 DME Arranged: N/A DME Agency: NA                   Social Determinants of Health (SDOH) Interventions SDOH Screenings   Food Insecurity: Food Insecurity Present (03/24/2024)  Housing: Low Risk  (03/24/2024)  Transportation Needs: No Transportation Needs (03/24/2024)  Utilities: Not At Risk (03/24/2024)  Alcohol Screen: Low Risk  (10/22/2023)  Depression (PHQ2-9): Low Risk  (02/14/2024)  Financial Resource Strain: Low Risk  (07/19/2023)  Physical Activity: Inactive (07/19/2023)  Social Connections: Socially Isolated (03/24/2024)  Stress: No Stress Concern Present (07/19/2023)  Tobacco Use: Low Risk  (03/24/2024)  Health Literacy: Adequate Health Literacy (07/19/2023)    Readmission Risk Interventions    03/27/2024   11:47 AM  Readmission Risk Prevention Plan  Post Dischage Appt Complete  Medication Screening Complete  Transportation Screening Complete

## 2024-03-29 ENCOUNTER — Other Ambulatory Visit: Payer: Self-pay | Admitting: Nurse Practitioner

## 2024-03-29 DIAGNOSIS — I10 Essential (primary) hypertension: Secondary | ICD-10-CM | POA: Diagnosis not present

## 2024-03-29 DIAGNOSIS — T8454XD Infection and inflammatory reaction due to internal left knee prosthesis, subsequent encounter: Secondary | ICD-10-CM | POA: Diagnosis not present

## 2024-03-29 DIAGNOSIS — N400 Enlarged prostate without lower urinary tract symptoms: Secondary | ICD-10-CM | POA: Diagnosis not present

## 2024-03-29 DIAGNOSIS — D649 Anemia, unspecified: Secondary | ICD-10-CM | POA: Diagnosis not present

## 2024-03-29 DIAGNOSIS — E119 Type 2 diabetes mellitus without complications: Secondary | ICD-10-CM | POA: Diagnosis not present

## 2024-03-29 DIAGNOSIS — B957 Other staphylococcus as the cause of diseases classified elsewhere: Secondary | ICD-10-CM | POA: Diagnosis not present

## 2024-03-29 DIAGNOSIS — R0989 Other specified symptoms and signs involving the circulatory and respiratory systems: Secondary | ICD-10-CM | POA: Diagnosis not present

## 2024-03-29 DIAGNOSIS — T8454XA Infection and inflammatory reaction due to internal left knee prosthesis, initial encounter: Secondary | ICD-10-CM | POA: Diagnosis not present

## 2024-03-29 DIAGNOSIS — E785 Hyperlipidemia, unspecified: Secondary | ICD-10-CM | POA: Diagnosis not present

## 2024-03-29 DIAGNOSIS — M65162 Other infective (teno)synovitis, left knee: Secondary | ICD-10-CM | POA: Diagnosis not present

## 2024-03-29 DIAGNOSIS — Z758 Other problems related to medical facilities and other health care: Secondary | ICD-10-CM | POA: Diagnosis not present

## 2024-03-29 DIAGNOSIS — E1151 Type 2 diabetes mellitus with diabetic peripheral angiopathy without gangrene: Secondary | ICD-10-CM

## 2024-03-29 DIAGNOSIS — M6281 Muscle weakness (generalized): Secondary | ICD-10-CM | POA: Diagnosis not present

## 2024-03-29 DIAGNOSIS — Z96651 Presence of right artificial knee joint: Secondary | ICD-10-CM | POA: Diagnosis not present

## 2024-03-29 DIAGNOSIS — R2689 Other abnormalities of gait and mobility: Secondary | ICD-10-CM | POA: Diagnosis not present

## 2024-03-29 LAB — AEROBIC/ANAEROBIC CULTURE W GRAM STAIN (SURGICAL/DEEP WOUND)
Culture: NO GROWTH
Gram Stain: NONE SEEN

## 2024-03-29 MED ORDER — HEPARIN SOD (PORK) LOCK FLUSH 100 UNIT/ML IV SOLN
250.0000 [IU] | INTRAVENOUS | Status: AC | PRN
  Administered 2024-03-29: 250 [IU]

## 2024-03-29 NOTE — Telephone Encounter (Signed)
 Requesting: metFORMIN HCl ER 500 MG Oral Tablet Extended Release 24 Hour  Last Visit: 06/25/2023 Next Visit: 05/17/2024 Last Refill: 03/25/2024 by Dr. Magnus Ivan in hospital  Please Advise

## 2024-03-29 NOTE — Progress Notes (Signed)
 Patient ID: Richard Harding, male   DOB: 06/21/1946, 78 y.o.   MRN: [id number] Can discharge to skilled nursing today.

## 2024-03-29 NOTE — Progress Notes (Signed)
 Physical Therapy Treatment Patient Details Name: Richard Harding MRN: [id number] DOB: 1946/05/28 Today's Date: 03/29/2024   History of Present Illness Pt admitted with L knee synovitis with effusion and questionable infected TKR and now s/p I&D with poly exchange.  Ot with hx of bil TKR and prostate CA    PT Comments  Pt continues to make steady progress toward PT goals. Pain controlled with activity. Plan is for SNF today   If plan is discharge home, recommend the following: A little help with walking and/or transfers   Can travel by private vehicle        Equipment Recommendations  None recommended by PT    Recommendations for Other Services       Precautions / Restrictions Precautions Precautions: Fall;Knee Recall of Precautions/Restrictions: Intact Restrictions LLE Weight Bearing Per Provider Order: Weight bearing as tolerated     Mobility  Bed Mobility Overal bed mobility: Needs Assistance Bed Mobility: Supine to Sit     Supine to sit: Min assist     General bed mobility comments: light assist to elevate trunk    Transfers Overall transfer level: Needs assistance Equipment used: Rolling walker (2 wheels) Transfers: Sit to/from Stand Sit to Stand: Supervision           General transfer comment: cues for hand placement and LLE management    Ambulation/Gait Ambulation/Gait assistance: Supervision Gait Distance (Feet): 260 Feet Assistive device: Rolling walker (2 wheels) (cane simulation using rail in hallway) Gait Pattern/deviations: Step-to pattern, Step-through pattern, Decreased stance time - left Gait velocity: decr     General Gait Details: cues for gait progression, knee flexion on L during swing phase and heel strike; improving wt shift to LLE   Stairs             Wheelchair Mobility     Tilt Bed    Modified Rankin (Stroke Patients Only)       Balance   Sitting-balance support: No upper extremity supported, Feet  supported Sitting balance-Leahy Scale: Good     Standing balance support: No upper extremity supported                                Communication Communication Communication: No apparent difficulties  Cognition Arousal: Alert Behavior During Therapy: WFL for tasks assessed/performed   PT - Cognitive impairments: No apparent impairments                         Following commands: Intact      Cueing Cueing Techniques: Verbal cues  Exercises      General Comments        Pertinent Vitals/Pain Pain Assessment Pain Assessment: Faces Faces Pain Scale: Hurts little more Pain Location: L knee Pain Descriptors / Indicators: Discomfort, Sore Pain Intervention(s): Limited activity within patient's tolerance, Monitored during session, Premedicated before session, Repositioned    Home Living                          Prior Function            PT Goals (current goals can now be found in the care plan section) Acute Rehab PT Goals PT Goal Formulation: With patient Time For Goal Achievement: 04/01/24 Potential to Achieve Goals: Good Progress towards PT goals: Progressing toward goals    Frequency    7X/week  PT Plan      Co-evaluation              AM-PAC PT "6 Clicks" Mobility   Outcome Measure  Help needed turning from your back to your side while in a flat bed without using bedrails?: A Little Help needed moving from lying on your back to sitting on the side of a flat bed without using bedrails?: None Help needed moving to and from a bed to a chair (including a wheelchair)?: None Help needed standing up from a chair using your arms (e.g., wheelchair or bedside chair)?: None Help needed to walk in hospital room?: None Help needed climbing 3-5 steps with a railing? : A Little 6 Click Score: 22    End of Session Equipment Utilized During Treatment: Gait belt Activity Tolerance: Patient tolerated treatment  well Patient left: in bed;with call bell/phone within reach;with bed alarm set;with family/visitor present Nurse Communication: Mobility status PT Visit Diagnosis: Difficulty in walking, not elsewhere classified (R26.2)     Time: 2130-8657 PT Time Calculation (min) (ACUTE ONLY): 31 min  Charges:    $Gait Training: 23-37 mins PT General Charges $$ ACUTE PT VISIT: 1 Visit                     Tray Klayman, PT  Acute Rehab Dept ([facility]) [phone]  03/29/2024    [facility] 03/29/2024, 11:22 AM

## 2024-03-29 NOTE — Discharge Instructions (Signed)
 The patient may put all of his weight on his left lower extremity as comfort allows. While in skilled nursing, therapy can work on his mobility and range of motion as well as strengthening of his left knee. He will be on IV antibiotics for 6 weeks. The current dressing on his knee can get wet in the shower.  Change the dressing only if absolutely needed.

## 2024-03-29 NOTE — Care Management Important Message (Signed)
 Important Message  Patient Details IM Letter given. Name: Richard Harding MRN: [id number] Date of Birth: 01/05/1946   Important Message Given:  Yes - Medicare IM     [name] 03/29/2024, 10:34 AM

## 2024-03-29 NOTE — TOC Transition Note (Signed)
 Transition of Care [facility]) - Discharge Note   Patient Details  Name: Richard Harding MRN: [id number] Date of Birth: [birth year]  Transition of Care [facility]) CM/SW Contact:  Amada Jupiter, LCSW Phone Number: 03/29/2024, 1:01 PM   Clinical Narrative:     Pt medically cleared for dc today to [facility]  Pt has a friend set up to provide dc transportation.  RN to call report to [phone].  No further TOC needs.  Final next level of care: Skilled Nursing Facility Barriers to Discharge: Barriers Resolved   Patient Goals and CMS Choice Patient states their goals for this hospitalization and ongoing recovery are:: return home following rehab          Discharge Placement   Existing PASRR number confirmed : 03/28/24          Patient [facility] bed at: [facility] Patient to be transferred to facility by: friend Name of family member notified: pt will notify Patient and family notified of of transfer: 03/29/24  Discharge Plan and Services Additional resources added to the After Visit Summary for   In-house Referral: Clinical Social Work   Post Acute Care Choice: Skilled Nursing Facility          DME Arranged: N/A DME Agency: NA                  Social Drivers of Health (SDOH) Interventions SDOH Screenings   Food Insecurity: Food Insecurity Present (03/24/2024)  Housing: Low Risk  (03/24/2024)  Transportation Needs: No Transportation Needs (03/24/2024)  Utilities: Not At Risk (03/24/2024)  Alcohol Screen: Low Risk  (10/22/2023)  Depression (PHQ2-9): Low Risk  (02/14/2024)  Financial Resource Strain: Low Risk  (07/19/2023)  Physical Activity: Inactive (07/19/2023)  Social Connections: Socially Isolated (03/24/2024)  Stress: No Stress Concern Present (07/19/2023)  Tobacco Use: Low Risk  (03/24/2024)  Health Literacy: Adequate Health Literacy (07/19/2023)     Readmission Risk Interventions    03/27/2024   11:47 AM  Readmission Risk Prevention Plan  Post Dischage Appt Complete   Medication Screening Complete  Transportation Screening Complete

## 2024-03-29 NOTE — Discharge Summary (Signed)
 Patient ID: Richard Harding MRN: [id number] DOB/AGE: 09/08/1946 78 y.o.  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   Type 2 diabetes mellitus (HCC)    followed by pcp   (11-10-2023  pt stated does not check blood sugar)   Wears glasses     Surgeries: Procedure(s): LEFT KNEE SYNOVECTOMY, IRRIGATION AND DEBRIDEMENT WITH POLY EXCHANGE on 03/24/2024   Consultants:   Discharged Condition: Improved  Hospital Course: [name] is an 78 y.o. male who was admitted 03/24/2024 for operative treatment ofSynovitis of left knee. Patient has severe unremitting pain that affects sleep, daily activities, and work/hobbies. After pre-op clearance the patient was taken to the operating room on 03/24/2024 and underwent  Procedure(s): LEFT KNEE SYNOVECTOMY, IRRIGATION AND DEBRIDEMENT WITH POLY EXCHANGE.    Patient was given perioperative antibiotics:  Anti-infectives (From admission, onward)    Start     Dose/Rate Route Frequency Ordered Stop   03/28/24 1700  rifampin (RIFADIN) capsule 300 mg        300 mg Oral 2 times daily with meals 03/28/24 1325     03/28/24 0000  ceFAZolin (ANCEF) IVPB        2 g Intravenous Every 8 hours 03/28/24 1453 05/05/24 2359   03/28/24 0000  rifampin (RIFADIN) 300 MG capsule  Status:  Discontinued        300 mg Oral 2 times  daily with meals 03/28/24 1453 03/28/24    03/28/24 0000  rifampin (RIFADIN) 300 MG capsule        300 mg Oral 2 times daily with meals 03/28/24 1507 04/27/24 2359   03/28/24 0000  ceFAZolin (ANCEF) 2-4 GM/100ML-% IVPB  Status:  Discontinued        2 g Intravenous Every 8 hours 03/28/24 1507 03/28/24    03/27/24 1630  DAPTOmycin (CUBICIN) IVPB 700 mg/179mL premix  Status:  Discontinued        8 mg/kg  85 kg 200 mL/hr over 30 Minutes Intravenous Daily 03/27/24 1539 03/28/24 1112   03/24/24 2000  ceFAZolin (ANCEF) IVPB 2g/100 mL premix        2 g 200 mL/hr over 30 Minutes Intravenous Every 8 hours 03/24/24 1751     03/24/24 1537  vancomycin (VANCOCIN) powder  Status:  Discontinued          As needed 03/24/24 1537 03/24/24 1749   03/24/24 1458  vancomycin (VANCOCIN) 1-5 GM/200ML-% IVPB       Note to Pharmacy: Richardean Sale M: cabinet override      03/24/24 1458 03/25/24 0314        Patient was given sequential compression devices, early ambulation, and chemoprophylaxis to prevent DVT.  Patient benefited maximally from hospital stay and there were no complications.  His cultures did grow up to Staphylococcus epidermidis.  A PICC line was placed and the infectious disease service was consulted.  He will be on 6 weeks of IV antibiotics and then likely suppressive antibiotics for up to a year.  Recent vital signs: Patient Vitals for the past 24 hrs:  BP Temp Temp src Pulse Resp SpO2  03/29/24 0558 116/67 98.6 F (37 C) Oral (!) 103 17 97 %  03/28/24 2124 112/62 98.1 F (36.7 C) Oral 89 18 96 %  03/28/24 1400 122/71 98.1 F (36.7 C) Oral 74 16 100 %     Recent laboratory studies: No results for input(s): "WBC", "HGB", "HCT", "PLT", "NA", "K", "CL", "CO2", "BUN", "CREATININE", "GLUCOSE", "INR", "CALCIUM" in the last 72 hours.  Invalid input(s): "PT", "2"   Discharge Medications:   Allergies as of 03/29/2024       Reactions   Cinnamon Flavoring Agent (non-screening) Other (See  Comments)   Cinnamon chewing gum caused blisters on tongue        Medication List     TAKE these medications    ASHWAGANDHA PO Take 1 capsule by mouth daily.   aspirin 81 MG chewable tablet Chew 1 tablet (81 mg total) by mouth 2 (two) times daily. What changed: when to take this   atorvastatin 10 MG tablet Commonly known as: LIPITOR Take 1 tablet (10 mg total) by mouth daily.   b complex vitamins capsule Take 1 capsule by mouth daily.   CALCIUM 600 + D PO Take 1 tablet by mouth 3 (three) times a week.   ceFAZolin IVPB Commonly known as: ANCEF Inject 2 g into the vein every 8 (eight) hours. Indication:  Left knee PJI  First Dose: Yes Last Day of Therapy:  05/05/24 Labs - Once weekly:  CBC/D and CMP, Labs - Once weekly: ESR and CRP   chlorthalidone 25 MG tablet Commonly known as: HYGROTON Take 1 tablet by mouth once daily   fenofibrate 54 MG tablet Take 1 tablet (54 mg total) by mouth daily.   FLAX SEED OIL PO Take 1 capsule by mouth daily.   GLUCOSAMINE-CHONDROITIN PO Take 1 tablet by mouth daily.   loratadine 10 MG tablet Commonly known as: CLARITIN Take 1 tablet (10 mg total) by mouth daily.   LUTEIN PO Take 1 capsule by mouth daily.   metFORMIN 500 MG 24 hr tablet Commonly known as: GLUCOPHAGE-XR TAKE 1 TABLET BY MOUTH TWICE DAILY WITH A MEAL   methocarbamol 500 MG tablet Commonly known as: ROBAXIN Take 1 tablet (500 mg total) by mouth every 6 (six) hours as needed for muscle spasms.   metoprolol tartrate 100 MG tablet Commonly known as: LOPRESSOR Take 1 tablet by mouth twice daily   naproxen sodium 220 MG tablet Commonly known as: ALEVE Take 440 mg by mouth 2 (two) times daily as needed.   omega-3 acid ethyl esters 1 g capsule Commonly known as: LOVAZA Take 2 capsules (2 g total) by mouth 2 (two) times daily. What changed:  how much to take when to take this   OVER THE COUNTER MEDICATION Take 1 capsule by mouth daily. Mens Prostate  formula supplement   oxyCODONE 5 MG immediate release tablet Commonly known as: Oxy IR/ROXICODONE Take 1-2 tablets (5-10 mg total) by mouth every 6 (six) hours as needed for moderate pain (pain score 4-6) (pain score 4-6).   POTASSIUM PO Take 1 tablet by mouth daily.   rifampin 300 MG capsule Commonly known as: RIFADIN Take 1 capsule (300 mg total) by mouth 2 (two)  times daily with a meal.   solifenacin 5 MG tablet Commonly known as: VESIcare Take 1 tablet (5 mg total) by mouth daily.   ST JOHNS WORT PO Take 1 tablet by mouth daily.   tamsulosin 0.4 MG Caps capsule Commonly known as: FLOMAX Take 1 capsule (0.4 mg total) by mouth daily.   triamcinolone ointment 0.1 % Commonly known as: KENALOG Apply 1 Application topically 2 (two) times daily as needed for rash.   Turmeric 500 MG Caps Take 500 mg by mouth daily.   vitamin C 1000 MG tablet Take 1,000 mg by mouth 2 (two) times daily.               Home Infusion Instuctions  (From admission, onward)           Start     Ordered   03/28/24 0000  Home infusion instructions       Question:  Instructions  Answer:  Flushing of vascular access device: 0.9% NaCl pre/post medication administration and prn patency; Heparin 100 u/ml, 5ml for implanted ports and Heparin 10u/ml, 5ml for all other central venous catheters.   03/28/24 1453            Diagnostic Studies: [place] EKG SITE RITE Result Date: 03/27/2024 If Site Rite image not attached, placement could not be confirmed due to current cardiac rhythm.  XR Knee 1-2 Views Left Result Date: 02/28/2024 Left knee 2 views: Well-seated total knee arthroplasty without any evidence of loosening or hardware failure.  No acute fractures.  Knee is well located.     Disposition: Discharge disposition: 03-Skilled Nursing Facility       Discharge Instructions     Home infusion instructions   Complete by: As directed    Instructions: Flushing of vascular access device:  0.9% NaCl pre/post medication administration and prn patency; Heparin 100 u/ml, 5ml for implanted ports and Heparin 10u/ml, 5ml for all other central venous catheters.          Signed: Kathryne Hitch 03/29/2024, 7:19 AM

## 2024-03-29 NOTE — Discharge Summary (Deleted)
 Patient ID: Richard Harding MRN: [id number] DOB/AGE: 01-08-1946 78 y.o.  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   Type 2 diabetes mellitus (HCC)    followed by pcp   (11-10-2023  pt stated does not check blood sugar)   Wears glasses     Surgeries: Procedure(s): LEFT KNEE SYNOVECTOMY, IRRIGATION AND DEBRIDEMENT WITH POLY EXCHANGE on 03/24/2024   Consultants:   Discharged Condition: Improved  Hospital Course: Trever Streater is an 78 y.o. male who was admitted 03/24/2024 for operative treatment ofSynovitis of left knee. Patient has severe unremitting pain that affects sleep, daily activities, and work/hobbies. After pre-op clearance the patient was taken to the operating room on 03/24/2024 and underwent  Procedure(s): LEFT KNEE SYNOVECTOMY, IRRIGATION AND DEBRIDEMENT WITH POLY EXCHANGE.    Patient was given perioperative antibiotics:  Anti-infectives (From admission, onward)    Start     Dose/Rate Route Frequency Ordered Stop   03/28/24 1700  rifampin (RIFADIN) capsule 300 mg        300 mg Oral 2 times daily with meals 03/28/24 1325     03/28/24 0000  ceFAZolin (ANCEF) IVPB        2 g Intravenous Every 8 hours 03/28/24 1453 05/05/24 2359   03/28/24 0000  rifampin (RIFADIN) 300 MG capsule  Status:  Discontinued        300 mg Oral 2 times  daily with meals 03/28/24 1453 03/28/24    03/28/24 0000  rifampin (RIFADIN) 300 MG capsule        300 mg Oral 2 times daily with meals 03/28/24 1507 04/27/24 2359   03/28/24 0000  ceFAZolin (ANCEF) 2-4 GM/100ML-% IVPB  Status:  Discontinued        2 g Intravenous Every 8 hours 03/28/24 1507 03/28/24    03/27/24 1630  DAPTOmycin (CUBICIN) IVPB 700 mg/121mL premix  Status:  Discontinued        8 mg/kg  85 kg 200 mL/hr over 30 Minutes Intravenous Daily 03/27/24 1539 03/28/24 1112   03/24/24 2000  ceFAZolin (ANCEF) IVPB 2g/100 mL premix        2 g 200 mL/hr over 30 Minutes Intravenous Every 8 hours 03/24/24 1751     03/24/24 1537  vancomycin (VANCOCIN) powder  Status:  Discontinued          As needed 03/24/24 1537 03/24/24 1749   03/24/24 1458  vancomycin (VANCOCIN) 1-5 GM/200ML-% IVPB       Note to Pharmacy: Richardean Sale M: cabinet override      03/24/24 1458 03/25/24 0314        Patient was given sequential compression devices, early ambulation, and chemoprophylaxis to prevent DVT.  Patient benefited maximally from hospital stay and there were no complications.    Recent vital signs: Patient Vitals for the  past 24 hrs:  BP Temp Temp src Pulse Resp SpO2  03/29/24 0818 113/72 -- -- (!) 103 -- --  03/29/24 0558 116/67 98.6 F (37 C) Oral (!) 103 17 97 %  03/28/24 2124 112/62 98.1 F (36.7 C) Oral 89 18 96 %  03/28/24 1400 122/71 98.1 F (36.7 C) Oral 74 16 100 %     Recent laboratory studies: No results for input(s): "WBC", "HGB", "HCT", "PLT", "NA", "K", "CL", "CO2", "BUN", "CREATININE", "GLUCOSE", "INR", "CALCIUM" in the last 72 hours.  Invalid input(s): "PT", "2"   Discharge Medications:   Allergies as of 03/29/2024       Reactions   Cinnamon Flavoring Agent (non-screening) Other (See Comments)   Cinnamon chewing gum caused blisters on tongue        Medication List     TAKE these medications    ASHWAGANDHA PO Take 1 capsule by mouth daily.   aspirin 81 MG  chewable tablet Chew 1 tablet (81 mg total) by mouth 2 (two) times daily. What changed: when to take this   atorvastatin 10 MG tablet Commonly known as: LIPITOR Take 1 tablet (10 mg total) by mouth daily.   b complex vitamins capsule Take 1 capsule by mouth daily.   CALCIUM 600 + D PO Take 1 tablet by mouth 3 (three) times a week.   ceFAZolin IVPB Commonly known as: ANCEF Inject 2 g into the vein every 8 (eight) hours. Indication:  Left knee PJI  First Dose: Yes Last Day of Therapy:  05/05/24 Labs - Once weekly:  CBC/D and CMP, Labs - Once weekly: ESR and CRP   chlorthalidone 25 MG tablet Commonly known as: HYGROTON Take 1 tablet by mouth once daily   fenofibrate 54 MG tablet Take 1 tablet (54 mg total) by mouth daily.   FLAX SEED OIL PO Take 1 capsule by mouth daily.   GLUCOSAMINE-CHONDROITIN PO Take 1 tablet by mouth daily.   loratadine 10 MG tablet Commonly known as: CLARITIN Take 1 tablet (10 mg total) by mouth daily.   LUTEIN PO Take 1 capsule by mouth daily.   metFORMIN 500 MG 24 hr tablet Commonly known as: GLUCOPHAGE-XR TAKE 1 TABLET BY MOUTH TWICE DAILY WITH A MEAL   methocarbamol 500 MG tablet Commonly known as: ROBAXIN Take 1 tablet (500 mg total) by mouth every 6 (six) hours as needed for muscle spasms.   metoprolol tartrate 100 MG tablet Commonly known as: LOPRESSOR Take 1 tablet by mouth twice daily   naproxen sodium 220 MG tablet Commonly known as: ALEVE Take 440 mg by mouth 2 (two) times daily as needed.   omega-3 acid ethyl esters 1 g capsule Commonly known as: LOVAZA Take 2 capsules (2 g total) by mouth 2 (two) times daily. What changed:  how much to take when to take this   OVER THE COUNTER MEDICATION Take 1 capsule by mouth daily. Mens Prostate formula supplement   oxyCODONE 5 MG immediate release tablet Commonly known as: Oxy IR/ROXICODONE Take 1-2 tablets (5-10 mg total) by mouth every 6 (six) hours as needed for moderate pain  (pain score 4-6) (pain score 4-6).   POTASSIUM PO Take 1 tablet by mouth daily.   rifampin 300 MG capsule Commonly known as: RIFADIN Take 1 capsule (300 mg total) by mouth 2 (two) times daily with a meal.   solifenacin 5 MG tablet Commonly known as: VESIcare Take 1 tablet (5 mg total) by mouth daily.   ST JOHNS WORT PO  Take 1 tablet by mouth daily.   tamsulosin 0.4 MG Caps capsule Commonly known as: FLOMAX Take 1 capsule (0.4 mg total) by mouth daily.   triamcinolone ointment 0.1 % Commonly known as: KENALOG Apply 1 Application topically 2 (two) times daily as needed for rash.   Turmeric 500 MG Caps Take 500 mg by mouth daily.   vitamin C 1000 MG tablet Take 1,000 mg by mouth 2 (two) times daily.               Home Infusion Instuctions  (From admission, onward)           Start     Ordered   03/28/24 0000  Home infusion instructions       Question:  Instructions  Answer:  Flushing of vascular access device: 0.9% NaCl pre/post medication administration and prn patency; Heparin 100 u/ml, 5ml for implanted ports and Heparin 10u/ml, 5ml for all other central venous catheters.   03/28/24 1453            Diagnostic Studies: [place] EKG SITE RITE Result Date: 03/27/2024 If Site Rite image not attached, placement could not be confirmed due to current cardiac rhythm.  XR Knee 1-2 Views Left Result Date: 02/28/2024 Left knee 2 views: Well-seated total knee arthroplasty without any evidence of loosening or hardware failure.  No acute fractures.  Knee is well located.     Disposition: Discharge disposition: 03-Skilled Nursing Facility       Discharge Instructions     Home infusion instructions   Complete by: As directed    Instructions: Flushing of vascular access device: 0.9% NaCl pre/post medication administration and prn patency; Heparin 100 u/ml, 5ml for implanted ports and Heparin 10u/ml, 5ml for all other central venous catheters.        Contact  information for follow-up providers     Kathryne Hitch, MD. Schedule an appointment as soon as possible for a visit in 2 week(s).   Specialty: Orthopedic Surgery Contact information: [address] [place] [address] [phone]              Contact information for after-discharge care     Destination     [facility], [facility] Preferred SNF .   Service: Skilled Nursing Contact information: [address] [place] [address] [phone]                      Signed: Kathryne Hitch 03/29/2024, 12:51 PM

## 2024-03-30 ENCOUNTER — Other Ambulatory Visit: Payer: Self-pay | Admitting: Urology

## 2024-03-30 DIAGNOSIS — C61 Malignant neoplasm of prostate: Secondary | ICD-10-CM

## 2024-03-30 DIAGNOSIS — T8454XD Infection and inflammatory reaction due to internal left knee prosthesis, subsequent encounter: Secondary | ICD-10-CM | POA: Diagnosis not present

## 2024-03-30 DIAGNOSIS — E785 Hyperlipidemia, unspecified: Secondary | ICD-10-CM | POA: Diagnosis not present

## 2024-03-30 DIAGNOSIS — N400 Enlarged prostate without lower urinary tract symptoms: Secondary | ICD-10-CM | POA: Diagnosis not present

## 2024-03-30 DIAGNOSIS — M65162 Other infective (teno)synovitis, left knee: Secondary | ICD-10-CM | POA: Diagnosis not present

## 2024-03-31 ENCOUNTER — Telehealth: Payer: Self-pay

## 2024-03-31 DIAGNOSIS — E785 Hyperlipidemia, unspecified: Secondary | ICD-10-CM | POA: Diagnosis not present

## 2024-03-31 DIAGNOSIS — M65162 Other infective (teno)synovitis, left knee: Secondary | ICD-10-CM | POA: Diagnosis not present

## 2024-03-31 DIAGNOSIS — I10 Essential (primary) hypertension: Secondary | ICD-10-CM | POA: Diagnosis not present

## 2024-03-31 DIAGNOSIS — T8454XD Infection and inflammatory reaction due to internal left knee prosthesis, subsequent encounter: Secondary | ICD-10-CM | POA: Diagnosis not present

## 2024-03-31 NOTE — Telephone Encounter (Signed)
 Richard Harding, practitioner  with [facility] would like a call back concerning antibiotics. Patient had left knee surgery on 03/24/24. Cb# [phone].  Please advise.

## 2024-04-03 DIAGNOSIS — Z758 Other problems related to medical facilities and other health care: Secondary | ICD-10-CM | POA: Diagnosis not present

## 2024-04-03 DIAGNOSIS — T8454XD Infection and inflammatory reaction due to internal left knee prosthesis, subsequent encounter: Secondary | ICD-10-CM | POA: Diagnosis not present

## 2024-04-03 DIAGNOSIS — E119 Type 2 diabetes mellitus without complications: Secondary | ICD-10-CM | POA: Diagnosis not present

## 2024-04-03 DIAGNOSIS — Z96651 Presence of right artificial knee joint: Secondary | ICD-10-CM | POA: Diagnosis not present

## 2024-04-03 NOTE — Telephone Encounter (Signed)
 LMOM for Davied Estelle of the below message from Dr. Lucienne Ryder

## 2024-04-05 ENCOUNTER — Telehealth: Payer: Self-pay

## 2024-04-05 DIAGNOSIS — M65162 Other infective (teno)synovitis, left knee: Secondary | ICD-10-CM | POA: Diagnosis not present

## 2024-04-05 DIAGNOSIS — B957 Other staphylococcus as the cause of diseases classified elsewhere: Secondary | ICD-10-CM | POA: Diagnosis not present

## 2024-04-05 DIAGNOSIS — T8454XA Infection and inflammatory reaction due to internal left knee prosthesis, initial encounter: Secondary | ICD-10-CM | POA: Diagnosis not present

## 2024-04-05 DIAGNOSIS — D649 Anemia, unspecified: Secondary | ICD-10-CM | POA: Diagnosis not present

## 2024-04-05 DIAGNOSIS — T8454XD Infection and inflammatory reaction due to internal left knee prosthesis, subsequent encounter: Secondary | ICD-10-CM | POA: Diagnosis not present

## 2024-04-05 NOTE — Telephone Encounter (Signed)
 Received following update from Pam with [employer].  [name] is an OPAT pt who discharged from [facility] to [facility] and will now DC home DC continuing on his Cefazolin.   Gasper Karst will be his HHA. I will see pt today at [facility] for education and then pt will DC home to start with us  with his afternoon dose.   Thanks.  Pam   Julien Odor, RMA

## 2024-04-06 ENCOUNTER — Encounter: Payer: Self-pay | Admitting: Orthopaedic Surgery

## 2024-04-06 ENCOUNTER — Ambulatory Visit (INDEPENDENT_AMBULATORY_CARE_PROVIDER_SITE_OTHER): Admitting: Orthopaedic Surgery

## 2024-04-06 ENCOUNTER — Telehealth: Payer: Self-pay

## 2024-04-06 ENCOUNTER — Telehealth: Payer: Self-pay | Admitting: Orthopaedic Surgery

## 2024-04-06 DIAGNOSIS — B957 Other staphylococcus as the cause of diseases classified elsewhere: Secondary | ICD-10-CM | POA: Diagnosis not present

## 2024-04-06 DIAGNOSIS — M159 Polyosteoarthritis, unspecified: Secondary | ICD-10-CM | POA: Diagnosis not present

## 2024-04-06 DIAGNOSIS — Z96651 Presence of right artificial knee joint: Secondary | ICD-10-CM | POA: Diagnosis not present

## 2024-04-06 DIAGNOSIS — I10 Essential (primary) hypertension: Secondary | ICD-10-CM | POA: Diagnosis not present

## 2024-04-06 DIAGNOSIS — M6281 Muscle weakness (generalized): Secondary | ICD-10-CM | POA: Diagnosis not present

## 2024-04-06 DIAGNOSIS — Z452 Encounter for adjustment and management of vascular access device: Secondary | ICD-10-CM | POA: Diagnosis not present

## 2024-04-06 DIAGNOSIS — C61 Malignant neoplasm of prostate: Secondary | ICD-10-CM | POA: Diagnosis not present

## 2024-04-06 DIAGNOSIS — Z7984 Long term (current) use of oral hypoglycemic drugs: Secondary | ICD-10-CM | POA: Diagnosis not present

## 2024-04-06 DIAGNOSIS — Z9181 History of falling: Secondary | ICD-10-CM | POA: Diagnosis not present

## 2024-04-06 DIAGNOSIS — Z96659 Presence of unspecified artificial knee joint: Secondary | ICD-10-CM

## 2024-04-06 DIAGNOSIS — Z7982 Long term (current) use of aspirin: Secondary | ICD-10-CM | POA: Diagnosis not present

## 2024-04-06 DIAGNOSIS — Z792 Long term (current) use of antibiotics: Secondary | ICD-10-CM | POA: Diagnosis not present

## 2024-04-06 DIAGNOSIS — I4510 Unspecified right bundle-branch block: Secondary | ICD-10-CM | POA: Diagnosis not present

## 2024-04-06 DIAGNOSIS — M65162 Other infective (teno)synovitis, left knee: Secondary | ICD-10-CM | POA: Diagnosis not present

## 2024-04-06 DIAGNOSIS — T8459XD Infection and inflammatory reaction due to other internal joint prosthesis, subsequent encounter: Secondary | ICD-10-CM

## 2024-04-06 DIAGNOSIS — E782 Mixed hyperlipidemia: Secondary | ICD-10-CM | POA: Diagnosis not present

## 2024-04-06 DIAGNOSIS — D630 Anemia in neoplastic disease: Secondary | ICD-10-CM | POA: Diagnosis not present

## 2024-04-06 DIAGNOSIS — Z8611 Personal history of tuberculosis: Secondary | ICD-10-CM | POA: Diagnosis not present

## 2024-04-06 DIAGNOSIS — E119 Type 2 diabetes mellitus without complications: Secondary | ICD-10-CM | POA: Diagnosis not present

## 2024-04-06 DIAGNOSIS — N401 Enlarged prostate with lower urinary tract symptoms: Secondary | ICD-10-CM | POA: Diagnosis not present

## 2024-04-06 DIAGNOSIS — N138 Other obstructive and reflux uropathy: Secondary | ICD-10-CM | POA: Diagnosis not present

## 2024-04-06 DIAGNOSIS — T8454XA Infection and inflammatory reaction due to internal left knee prosthesis, initial encounter: Secondary | ICD-10-CM | POA: Diagnosis not present

## 2024-04-06 DIAGNOSIS — D6859 Other primary thrombophilia: Secondary | ICD-10-CM | POA: Diagnosis not present

## 2024-04-06 NOTE — Telephone Encounter (Signed)
 LMOM with verbal

## 2024-04-06 NOTE — Transitions of Care (Post Inpatient/ED Visit) (Signed)
 04/06/2024  Name: Richard Harding MRN: [id number] DOB: 29-Mar-1946  Today's TOC FU Call Status: Today's TOC FU Call Status:: Successful TOC FU Call Completed TOC FU Call Complete Date: 04/06/24 Patient's Name and Date of Birth confirmed.  Transition Care Management Follow-up Telephone Call Date of Discharge: 04/05/24 Discharge Facility: Other [facility]) Name of Other (Non[facility]) Discharge Facility: [facility] Type of Discharge: Inpatient Admission Primary Inpatient Discharge Diagnosis:: left knee pain How have you been since you were released from the hospital?: Better Any questions or concerns?: No  Items Reviewed: Did you receive and understand the discharge instructions provided?: Yes Medications obtained,verified, and reconciled?: Yes (Medications Reviewed) Any new allergies since your discharge?: No Dietary orders reviewed?: Yes Do you have support at home?: Yes Name of Support/Comfort Primary Source: caregiver  Medications Reviewed Today: Medications Reviewed Today     Reviewed by Karena Addison, LPN (Licensed Practical Nurse) on 04/06/24 at 1013  Med List Status: <None>   Medication Order Taking? Sig Documenting Provider Last Dose Status Informant  Ascorbic Acid (VITAMIN C) 1000 MG tablet [id number] No Take 1,000 mg by mouth 2 (two) times daily. [provider] 03/17/2024 Active Self  ASHWAGANDHA PO [id number] No Take 1 capsule by mouth daily. [provider] 03/17/2024 Active Self  aspirin 81 MG chewable tablet [id number]  Chew 1 tablet (81 mg total) by mouth 2 (two) times daily. Kathryne Hitch, MD  Active   atorvastatin (LIPITOR) 10 MG tablet [id number] No Take 1 tablet (10 mg total) by mouth daily. McElwee, Lauren A, NP 03/23/2024 Bedtime Active Self  b complex vitamins capsule [id number] No Take 1 capsule by mouth daily. [provider] 03/17/2024 Active Self  Calcium Carb-Cholecalciferol (CALCIUM 600 + D PO) [id number] No Take 1  tablet by mouth 3 (three) times a week. [provider] 03/17/2024 Active Self  ceFAZolin (ANCEF) IVPB [id number]  Inject 2 g into the vein every 8 (eight) hours. Indication:  Left knee PJI  First Dose: Yes Last Day of Therapy:  05/05/24 Labs - Once weekly:  CBC/D and CMP, Labs - Once weekly: ESR and CRP Odette Fraction, MD  Active   chlorthalidone (HYGROTON) 25 MG tablet [id number] No Take 1 tablet by mouth once daily McElwee, Lauren A, NP 03/23/2024 Evening Active Self  fenofibrate 54 MG tablet [id number] No Take 1 tablet (54 mg total) by mouth daily. McElwee, Lauren A, NP Unknown Active Self  Flaxseed, Linseed, (FLAX SEED OIL PO) [id number] No Take 1 capsule by mouth daily. [provider] 03/17/2024 Active Self  GLUCOSAMINE-CHONDROITIN PO [id number] No Take 1 tablet by mouth daily. [provider] 03/17/2024 Active Self  loratadine (CLARITIN) 10 MG tablet [id number] No Take 1 tablet (10 mg total) by mouth daily. Gerre Scull, NP Past Week Active Self           Med Note Sherrie Mustache, KYLE A   Tue Mar 14, 2024 11:25 AM) Pt ran out, needs refill  LUTEIN PO [id number] No Take 1 capsule by mouth daily. [provider] 03/17/2024 Active Self  metFORMIN (GLUCOPHAGE-XR) 500 MG 24 hr tablet [id number]  TAKE 1 TABLET BY MOUTH TWICE DAILY WITH A MEAL McElwee, Lauren A, NP  Active   methocarbamol (ROBAXIN) 500 MG tablet [id number]  Take 1 tablet (500 mg total) by mouth every 6 (six) hours as needed for muscle spasms. Kathryne Hitch, MD  Active   metoprolol tartrate (LOPRESSOR) 100 MG tablet [id number] No Take 1 tablet by mouth twice daily McElwee, Lauren A,  NP 03/24/2024 10:30 AM Active Self  naproxen sodium (ALEVE) 220 MG tablet [id number] No Take 440 mg by mouth 2 (two) times daily as needed. [provider] 03/23/2024 Active Self  omega-3 acid ethyl esters (LOVAZA) 1 g capsule [id number] No Take 2 capsules (2 g total) by mouth 2 (two) times daily.  Patient taking  differently: Take 4 g by mouth daily.   Odette Benjamin, NP 03/23/2024 Active Self  OVER THE COUNTER MEDICATION [id number] No Take 1 capsule by mouth daily. Mens Prostate formula supplement [provider] 03/17/2024 Active Self  oxyCODONE (OXY IR/ROXICODONE) 5 MG immediate release tablet [id number]  Take 1-2 tablets (5-10 mg total) by mouth every 6 (six) hours as needed for moderate pain (pain score 4-6) (pain score 4-6). Arnie Lao, MD  Active   POTASSIUM PO [id number] No Take 1 tablet by mouth daily. [provider] 03/17/2024 Active Self  rifampin (RIFADIN) 300 MG capsule [id number]  Take 1 capsule (300 mg total) by mouth 2 (two) times daily with a meal. Arnie Lao, MD  Active   solifenacin (VESICARE) 5 MG tablet [id number] No Take 1 tablet (5 mg total) by mouth daily. Bruning, Ashlyn, PA-C Unknown Active Self  [place] JOHNS WORT PO [id number] No Take 1 tablet by mouth daily. [provider] 03/17/2024 Active Self  tamsulosin (FLOMAX) 0.4 MG CAPS capsule [id number] No Take 1 capsule (0.4 mg total) by mouth daily. Graig Lawyer, MD 03/23/2024 Active Self           Med Note Shann Darnel, KYLE A   Tue Mar 14, 2024 11:31 AM) Pt ran out, needs to get more  triamcinolone ointment (KENALOG) 0.1 % [id number] No Apply 1 Application topically 2 (two) times daily as needed for rash. [provider] Past Week Active Self  Turmeric 500 MG CAPS [id number] No Take 500 mg by mouth daily. [provider] 03/17/2024 Active Self  Med List Note Kolleen Perone, CPhT 09/10/22 1430): Pt prefers to receive text messages if he does not answer the phone and not a voicemail            Home Care and Equipment/Supplies: Were Home Health Services Ordered?: Yes Name of Home Health Agency:: unknown Has Agency set up a time to come to your home?: Yes First Home Health Visit Date: 04/06/24  Functional Questionnaire: Do you need assistance with bathing/showering or  dressing?: Yes Do you need assistance with meal preparation?: No Do you need assistance with eating?: No Do you have difficulty maintaining continence: No Do you need assistance with getting out of bed/getting out of a chair/moving?: No Do you have difficulty managing or taking your medications?: Yes  Follow up appointments reviewed: PCP Follow-up appointment confirmed?: NA Specialist Hospital Follow-up appointment confirmed?: Yes Date of Specialist follow-up appointment?: 04/06/24 Follow-Up Specialty Provider:: McElwee Do you need transportation to your follow-up appointment?: No Do you understand care options if your condition(s) worsen?: Yes-patient verbalized understanding    SIGNATURE Darrall Ellison, LPN [facility] Nurse Health Advisor Direct Dial [phone]

## 2024-04-06 NOTE — Progress Notes (Signed)
 The patient is a 78 year old gentleman who is here for his first postoperative visit status post an open incision and drainage with an I&D of an infected left total knee arthroplasty.  We performed a synovectomy of all 3 compartments and a polyliner exchange.  He has a PICC line in place and is followed by the ID service.  He understands that he will have 6 weeks of IV antibiotics followed by oral antibiotics.  He is ambulate with a cane and reports improved range of motion and strength.  Examination of his left operative knee today shows the staples are intact to remove the staples and place Steri-Strips.  He does have swelling of the knee but I attempted to aspirate fluid from the knee and did not get any fluid at all so this is most likely just congealed blood in the knee from the actual surgery and synovectomy itself.  This was encouraging though.  There was no redness.  From my standpoint we will see him back in 4 weeks to see how he is doing from a mobility standpoint.  He has a follow-up with the ID service next week.

## 2024-04-06 NOTE — Telephone Encounter (Signed)
 April (wound RN) called from [facility] called requesting verbals for 2wk 2, and 1 wk 3 for picc line care and monitor surgical site. April secure line is [phone].

## 2024-04-07 DIAGNOSIS — I4510 Unspecified right bundle-branch block: Secondary | ICD-10-CM | POA: Diagnosis not present

## 2024-04-07 DIAGNOSIS — Z452 Encounter for adjustment and management of vascular access device: Secondary | ICD-10-CM | POA: Diagnosis not present

## 2024-04-07 DIAGNOSIS — E782 Mixed hyperlipidemia: Secondary | ICD-10-CM | POA: Diagnosis not present

## 2024-04-07 DIAGNOSIS — Z9181 History of falling: Secondary | ICD-10-CM | POA: Diagnosis not present

## 2024-04-07 DIAGNOSIS — T8454XA Infection and inflammatory reaction due to internal left knee prosthesis, initial encounter: Secondary | ICD-10-CM | POA: Diagnosis not present

## 2024-04-07 DIAGNOSIS — I10 Essential (primary) hypertension: Secondary | ICD-10-CM | POA: Diagnosis not present

## 2024-04-07 DIAGNOSIS — D630 Anemia in neoplastic disease: Secondary | ICD-10-CM | POA: Diagnosis not present

## 2024-04-07 DIAGNOSIS — Z7984 Long term (current) use of oral hypoglycemic drugs: Secondary | ICD-10-CM | POA: Diagnosis not present

## 2024-04-07 DIAGNOSIS — Z96651 Presence of right artificial knee joint: Secondary | ICD-10-CM | POA: Diagnosis not present

## 2024-04-07 DIAGNOSIS — B957 Other staphylococcus as the cause of diseases classified elsewhere: Secondary | ICD-10-CM | POA: Diagnosis not present

## 2024-04-07 DIAGNOSIS — M159 Polyosteoarthritis, unspecified: Secondary | ICD-10-CM | POA: Diagnosis not present

## 2024-04-07 DIAGNOSIS — D6859 Other primary thrombophilia: Secondary | ICD-10-CM | POA: Diagnosis not present

## 2024-04-07 DIAGNOSIS — Z8611 Personal history of tuberculosis: Secondary | ICD-10-CM | POA: Diagnosis not present

## 2024-04-07 DIAGNOSIS — Z792 Long term (current) use of antibiotics: Secondary | ICD-10-CM | POA: Diagnosis not present

## 2024-04-07 DIAGNOSIS — E119 Type 2 diabetes mellitus without complications: Secondary | ICD-10-CM | POA: Diagnosis not present

## 2024-04-07 DIAGNOSIS — N138 Other obstructive and reflux uropathy: Secondary | ICD-10-CM | POA: Diagnosis not present

## 2024-04-07 DIAGNOSIS — M65162 Other infective (teno)synovitis, left knee: Secondary | ICD-10-CM | POA: Diagnosis not present

## 2024-04-07 DIAGNOSIS — M6281 Muscle weakness (generalized): Secondary | ICD-10-CM | POA: Diagnosis not present

## 2024-04-07 DIAGNOSIS — N401 Enlarged prostate with lower urinary tract symptoms: Secondary | ICD-10-CM | POA: Diagnosis not present

## 2024-04-07 DIAGNOSIS — Z7982 Long term (current) use of aspirin: Secondary | ICD-10-CM | POA: Diagnosis not present

## 2024-04-07 DIAGNOSIS — C61 Malignant neoplasm of prostate: Secondary | ICD-10-CM | POA: Diagnosis not present

## 2024-04-10 DIAGNOSIS — T8454XA Infection and inflammatory reaction due to internal left knee prosthesis, initial encounter: Secondary | ICD-10-CM | POA: Diagnosis not present

## 2024-04-10 DIAGNOSIS — Z9181 History of falling: Secondary | ICD-10-CM | POA: Diagnosis not present

## 2024-04-10 DIAGNOSIS — Z8611 Personal history of tuberculosis: Secondary | ICD-10-CM | POA: Diagnosis not present

## 2024-04-10 DIAGNOSIS — N138 Other obstructive and reflux uropathy: Secondary | ICD-10-CM | POA: Diagnosis not present

## 2024-04-10 DIAGNOSIS — Z452 Encounter for adjustment and management of vascular access device: Secondary | ICD-10-CM | POA: Diagnosis not present

## 2024-04-10 DIAGNOSIS — M65162 Other infective (teno)synovitis, left knee: Secondary | ICD-10-CM | POA: Diagnosis not present

## 2024-04-10 DIAGNOSIS — I10 Essential (primary) hypertension: Secondary | ICD-10-CM | POA: Diagnosis not present

## 2024-04-10 DIAGNOSIS — D630 Anemia in neoplastic disease: Secondary | ICD-10-CM | POA: Diagnosis not present

## 2024-04-10 DIAGNOSIS — M159 Polyosteoarthritis, unspecified: Secondary | ICD-10-CM | POA: Diagnosis not present

## 2024-04-10 DIAGNOSIS — Z96651 Presence of right artificial knee joint: Secondary | ICD-10-CM | POA: Diagnosis not present

## 2024-04-10 DIAGNOSIS — E782 Mixed hyperlipidemia: Secondary | ICD-10-CM | POA: Diagnosis not present

## 2024-04-10 DIAGNOSIS — I4510 Unspecified right bundle-branch block: Secondary | ICD-10-CM | POA: Diagnosis not present

## 2024-04-10 DIAGNOSIS — M6281 Muscle weakness (generalized): Secondary | ICD-10-CM | POA: Diagnosis not present

## 2024-04-10 DIAGNOSIS — B957 Other staphylococcus as the cause of diseases classified elsewhere: Secondary | ICD-10-CM | POA: Diagnosis not present

## 2024-04-10 DIAGNOSIS — C61 Malignant neoplasm of prostate: Secondary | ICD-10-CM | POA: Diagnosis not present

## 2024-04-10 DIAGNOSIS — N401 Enlarged prostate with lower urinary tract symptoms: Secondary | ICD-10-CM | POA: Diagnosis not present

## 2024-04-10 DIAGNOSIS — D6859 Other primary thrombophilia: Secondary | ICD-10-CM | POA: Diagnosis not present

## 2024-04-10 DIAGNOSIS — Z7984 Long term (current) use of oral hypoglycemic drugs: Secondary | ICD-10-CM | POA: Diagnosis not present

## 2024-04-10 DIAGNOSIS — E119 Type 2 diabetes mellitus without complications: Secondary | ICD-10-CM | POA: Diagnosis not present

## 2024-04-10 DIAGNOSIS — Z792 Long term (current) use of antibiotics: Secondary | ICD-10-CM | POA: Diagnosis not present

## 2024-04-10 DIAGNOSIS — Z7982 Long term (current) use of aspirin: Secondary | ICD-10-CM | POA: Diagnosis not present

## 2024-04-11 ENCOUNTER — Other Ambulatory Visit: Payer: Self-pay

## 2024-04-11 ENCOUNTER — Ambulatory Visit: Payer: Self-pay | Admitting: Infectious Diseases

## 2024-04-11 VITALS — BP 115/72 | HR 84 | Temp 98.8°F | Wt 190.0 lb

## 2024-04-11 DIAGNOSIS — Z5181 Encounter for therapeutic drug level monitoring: Secondary | ICD-10-CM

## 2024-04-11 DIAGNOSIS — M65162 Other infective (teno)synovitis, left knee: Secondary | ICD-10-CM | POA: Diagnosis not present

## 2024-04-11 DIAGNOSIS — E119 Type 2 diabetes mellitus without complications: Secondary | ICD-10-CM | POA: Diagnosis not present

## 2024-04-11 DIAGNOSIS — Z8611 Personal history of tuberculosis: Secondary | ICD-10-CM | POA: Diagnosis not present

## 2024-04-11 DIAGNOSIS — I10 Essential (primary) hypertension: Secondary | ICD-10-CM | POA: Diagnosis not present

## 2024-04-11 DIAGNOSIS — C61 Malignant neoplasm of prostate: Secondary | ICD-10-CM | POA: Diagnosis not present

## 2024-04-11 DIAGNOSIS — Z452 Encounter for adjustment and management of vascular access device: Secondary | ICD-10-CM | POA: Diagnosis not present

## 2024-04-11 DIAGNOSIS — D630 Anemia in neoplastic disease: Secondary | ICD-10-CM | POA: Diagnosis not present

## 2024-04-11 DIAGNOSIS — Z96651 Presence of right artificial knee joint: Secondary | ICD-10-CM | POA: Diagnosis not present

## 2024-04-11 DIAGNOSIS — T8454XD Infection and inflammatory reaction due to internal left knee prosthesis, subsequent encounter: Secondary | ICD-10-CM | POA: Diagnosis not present

## 2024-04-11 DIAGNOSIS — Z792 Long term (current) use of antibiotics: Secondary | ICD-10-CM | POA: Diagnosis not present

## 2024-04-11 DIAGNOSIS — B957 Other staphylococcus as the cause of diseases classified elsewhere: Secondary | ICD-10-CM | POA: Diagnosis not present

## 2024-04-11 DIAGNOSIS — M6281 Muscle weakness (generalized): Secondary | ICD-10-CM | POA: Diagnosis not present

## 2024-04-11 DIAGNOSIS — E782 Mixed hyperlipidemia: Secondary | ICD-10-CM | POA: Diagnosis not present

## 2024-04-11 DIAGNOSIS — N138 Other obstructive and reflux uropathy: Secondary | ICD-10-CM | POA: Diagnosis not present

## 2024-04-11 DIAGNOSIS — T8454XA Infection and inflammatory reaction due to internal left knee prosthesis, initial encounter: Secondary | ICD-10-CM | POA: Diagnosis not present

## 2024-04-11 DIAGNOSIS — Z9181 History of falling: Secondary | ICD-10-CM | POA: Diagnosis not present

## 2024-04-11 DIAGNOSIS — N401 Enlarged prostate with lower urinary tract symptoms: Secondary | ICD-10-CM | POA: Diagnosis not present

## 2024-04-11 DIAGNOSIS — M159 Polyosteoarthritis, unspecified: Secondary | ICD-10-CM | POA: Diagnosis not present

## 2024-04-11 DIAGNOSIS — D6859 Other primary thrombophilia: Secondary | ICD-10-CM | POA: Diagnosis not present

## 2024-04-11 DIAGNOSIS — Z7982 Long term (current) use of aspirin: Secondary | ICD-10-CM | POA: Diagnosis not present

## 2024-04-11 DIAGNOSIS — Z7984 Long term (current) use of oral hypoglycemic drugs: Secondary | ICD-10-CM | POA: Diagnosis not present

## 2024-04-11 DIAGNOSIS — I4510 Unspecified right bundle-branch block: Secondary | ICD-10-CM | POA: Diagnosis not present

## 2024-04-11 DIAGNOSIS — T8450XD Infection and inflammatory reaction due to unspecified internal joint prosthesis, subsequent encounter: Secondary | ICD-10-CM

## 2024-04-11 HISTORY — DX: Infection and inflammatory reaction due to unspecified internal joint prosthesis, subsequent encounter: T84.50XD

## 2024-04-11 HISTORY — DX: Encounter for therapeutic drug level monitoring: Z51.81

## 2024-04-11 MED ORDER — RIFAMPIN 300 MG PO CAPS
300.0000 mg | ORAL_CAPSULE | Freq: Two times a day (BID) | ORAL | 1 refills | Status: DC
Start: 2024-04-11 — End: 2024-05-02

## 2024-04-11 NOTE — Progress Notes (Addendum)
 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 10/06/2023   Low testosterone  03/03/2023   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   Elevated PSA 02/23/2023   Left leg swelling 10/05/2022   History of left knee replacement 09/25/2022   Chest pain 05/26/2022   Unilateral primary osteoarthritis, left knee 04/08/2022   Synovitis of left knee 03/26/2022   Rash 03/26/2022   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/26/2020   Hypokalemia 01/12/2019   DM (diabetes mellitus) type II, controlled, with peripheral vascular disorder (HCC) 09/16/2017   Essential hypertension 01/18/2014   Hyperlipidemia LDL goal <70 01/18/2014   Obesity (BMI 30-39.9) 01/18/2014    Patient's Medications  New Prescriptions   No medications on file  Previous Medications   ASCORBIC ACID  (VITAMIN C ) 1000 MG TABLET    Take 1,000 mg by mouth 2 (two) times daily.   ASHWAGANDHA PO    Take 1 capsule by mouth daily.   ASPIRIN  81 MG CHEWABLE TABLET    Chew 1 tablet (81 mg total) by mouth 2 (two) times daily.   ATORVASTATIN  (LIPITOR) 10 MG TABLET    Take 1 tablet (10 mg total) by mouth daily.   B COMPLEX VITAMINS CAPSULE    Take 1 capsule by mouth daily.   CALCIUM  CARB-CHOLECALCIFEROL (CALCIUM  600 + D PO)    Take 1 tablet by mouth 3 (three) times a week.   CEFAZOLIN  (ANCEF ) IVPB    Inject 2 g into the vein every 8 (eight) hours. Indication:  Left knee PJI  First Dose: Yes Last Day of Therapy:  05/05/24 Labs - Once weekly:  CBC/D and CMP, Labs - Once weekly: ESR and CRP   CHLORTHALIDONE  (HYGROTON ) 25 MG TABLET    Take 1 tablet by mouth once daily   FENOFIBRATE  54 MG TABLET    Take 1 tablet (54 mg total) by mouth daily.   FLAXSEED, LINSEED, (FLAX SEED OIL PO)    Take 1 capsule by mouth daily.   GLUCOSAMINE-CHONDROITIN PO    Take 1 tablet by mouth  daily.   LORATADINE  (CLARITIN ) 10 MG TABLET    Take 1 tablet (10 mg total) by mouth daily.   LUTEIN PO    Take 1 capsule by mouth daily.   METFORMIN  (GLUCOPHAGE -XR) 500 MG 24 HR TABLET    TAKE 1 TABLET BY MOUTH TWICE DAILY WITH A MEAL   METHOCARBAMOL  (ROBAXIN ) 500 MG TABLET    Take 1 tablet (500 mg total) by mouth every 6 (six) hours as needed for muscle spasms.   METOPROLOL  TARTRATE (LOPRESSOR ) 100 MG TABLET    Take 1 tablet by mouth twice daily   NAPROXEN  SODIUM (ALEVE ) 220 MG TABLET    Take 440 mg by mouth 2 (two) times daily as needed.   OMEGA-3 ACID ETHYL ESTERS (LOVAZA ) 1 G CAPSULE    Take 2 capsules (2 g total) by mouth 2 (two) times daily.   OVER THE COUNTER MEDICATION    Take 1 capsule by mouth daily. Mens Prostate formula supplement   OXYCODONE  (OXY IR/ROXICODONE ) 5 MG IMMEDIATE RELEASE TABLET    Take 1-2 tablets (5-10 mg total) by mouth every 6 (six) hours as needed for moderate pain (pain score 4-6) (pain score 4-6).   POTASSIUM PO    Take 1 tablet by mouth daily.  RIFAMPIN  (RIFADIN ) 300 MG CAPSULE    Take 1 capsule (300 mg total) by mouth 2 (two) times daily with a meal.   SOLIFENACIN  (VESICARE ) 5 MG TABLET    Take 1 tablet (5 mg total) by mouth daily.   ST JOHNS WORT PO    Take 1 tablet by mouth daily.   TAMSULOSIN  (FLOMAX ) 0.4 MG CAPS CAPSULE    Take 1 capsule (0.4 mg total) by mouth daily.   TRIAMCINOLONE OINTMENT (KENALOG) 0.1 %    Apply 1 Application topically 2 (two) times daily as needed for rash.   TURMERIC 500 MG CAPS    Take 500 mg by mouth daily.  Modified Medications   No medications on file  Discontinued Medications   No medications on file    Subjective: 78 Y O male with prior h/o HLD, HTN, BPH/Prostate ca, OA, DM2, B/L Total knee arthroplasty ( rt 04/26/2020 and left 09/25/2022) who is here after recent hospital admission ( 4/3-4/9) for Left knee PJI. He was having recurrent swelling of the left knee in the last few months. He was seen in Ortho office for the same  and on 03/09/24 fluid was aspirated from Left knee with 49K WBC, no growth in cultures. WBC with no leukcoytosis but ESR in 40s and CRP was 7. He reported being on Nitrofurantoin  since November 2024 for his urinary infection suppression. 4/4, underwent I&D with exploration and open arthrotomy and synovectomy of left knee, left knee polyliner exchange.  Findings-large cloudy joint effusion with questionable purulent material, Gram stain and culture of the fluid and soft tissues with MSSE. Discharged to SNF on 4/9 to complete 6 weeks of IV cefazolin  and Rifampin .   4/22 He has been discharged from SNF back to home and receiving IV cefazolin  through PICC without any concerns related to PICC or abtx. However he finds it difficult to do 3 injections in a day and asking if could switch to pills earlier. He also reports not taking PO Rifampin  due to high copay and asking to send the generic form. Seen by Dr Lucienne Ryder 4/17 and left knee healing with no concerns. An attempt at aspiration of left knee was done but no fluid and likely just congealed blood. He is ambulatory. Denies fevers, chills, nausea, vomiting, diarrhea, left knee pain or discharge.    Review of Systems: all systems reviewed with pertinent positives and negatives as listed above  Past Medical History:  Diagnosis Date   BPH with obstruction/lower urinary tract symptoms    Hyperlipidemia, mixed    Hypertension    Limited active range of motion (AROM) of cervical spine on rotation    Malignant neoplasm prostate [facility]) 08/2023   urologist-- dr stoneking/  radiation oncologist-- dr Lorri Rota;  dx 09/ 2024, gleason 4+5, psa 97.2,  vol 88.5   OA (osteoarthritis)    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   Type 2 diabetes mellitus (HCC)    followed by pcp   (11-10-2023  pt stated does not check blood sugar)   Wears glasses    Past Surgical History:  Procedure Laterality Date   ANTERIOR FUSION  CERVICAL SPINE  1990   Thinks it was C-3-4  done here at [facility]   BILATERAL CARPAL TUNNEL RELEASE Bilateral    1990s   COLONOSCOPY  2011   EXCISIONAL TOTAL KNEE ARTHROPLASTY WITH ANTIBIOTIC SPACERS Left 03/24/2024   Procedure: LEFT KNEE SYNOVECTOMY, IRRIGATION AND DEBRIDEMENT  WITH POLY EXCHANGE;  Surgeon: Arnie Lao, MD;  Location: [facility];  Service: Orthopedics;  Laterality: Left;   GOLD SEED IMPLANT N/A 11/23/2023   Procedure: GOLD SEED IMPLANT;  Surgeon: Mellie Sprinkle., MD;  Location: [facility];  Service: Urology;  Laterality: N/A;   SPACE OAR INSTILLATION N/A 11/23/2023   Procedure: SPACE OAR INSTILLATION;  Surgeon: Mellie Sprinkle., MD;  Location: [facility];  Service: Urology;  Laterality: N/A;   TOTAL KNEE ARTHROPLASTY Right 04/26/2020   Procedure: RIGHT TOTAL KNEE ARTHROPLASTY;  Surgeon: Arnie Lao, MD;  Location: [facility];  Service: Orthopedics;  Laterality: Right;   TOTAL KNEE ARTHROPLASTY Left 09/25/2022   Procedure: LEFT TOTAL KNEE ARTHROPLASTY;  Surgeon: Arnie Lao, MD;  Location: [facility];  Service: Orthopedics;  Laterality: Left;   Social History   Tobacco Use   Smoking status: Never   Smokeless tobacco: Never  Vaping Use   Vaping status: Never Used  Substance Use Topics   Alcohol use: No   Drug use: Never    No family history on file.  Allergies  Allergen Reactions   Cinnamon Flavoring Agent (Non-Screening) Other (See Comments)    Cinnamon chewing gum caused blisters on tongue    Health Maintenance  Topic Date Due   COVID-19 Vaccine (5 - 2024-25 season) 08/22/2023   FOOT EXAM  06/24/2024   Medicare Annual Wellness (AWV)  07/18/2024   INFLUENZA VACCINE  07/21/2024   HEMOGLOBIN A1C  08/13/2024   OPHTHALMOLOGY EXAM  09/09/2024   Diabetic kidney evaluation - Urine ACR  02/13/2025   Diabetic kidney evaluation - eGFR measurement  03/25/2025   DTaP/Tdap/Td (3 - Td or Tdap) 05/24/2032    Pneumonia Vaccine 56+ Years old  Completed   Hepatitis C Screening  Completed   Zoster Vaccines- Shingrix  Completed   HPV VACCINES  Aged Out   Meningococcal B Vaccine  Aged Out   Colonoscopy  Discontinued   Fecal DNA (Cologuard)  Discontinued    Objective: BP 115/72   Pulse 84   Temp 98.8 F (37.1 C) (Oral)   Wt 190 lb (86.2 kg)   SpO2 98%   BMI 28.06 kg/m    Physical Exam Constitutional:      Appearance: Normal appearance.  HENT:     Head: Normocephalic and atraumatic.      Mouth: Mucous membranes are moist.  Eyes:    Conjunctiva/sclera: Conjunctivae normal.     Pupils: Pupils are equal, round, and b/l symmetrical   Cardiovascular:     Rate and Rhythm: Normal rate and regular rhythm.     Heart sounds:   Pulmonary:     Effort: Pulmonary effort is normal.     Breath sounds:  Abdominal:     General: Non distended     Palpations:   Skin:    General: Skin is warm and dry.     Comments: rt arm PICC with no signs of infection  MSK - no pedal edema, left knee with steristrips, no signs of infection    Neurological:     General: grossly non focal     Mental Status: awake, alert and oriented to person, place, and time.   Psychiatric:        Mood and Affect: Mood normal.   Lab Results Lab Results  Component Value Date   WBC 6.4 03/26/2024   HGB 8.6 (L) 03/26/2024   HCT 26.1 (L) 03/26/2024   MCV 85.9 03/26/2024  PLT 374 03/26/2024    Lab Results  Component Value Date   CREATININE 1.00 03/25/2024   BUN 21 03/25/2024   NA 136 03/25/2024   K 3.4 (L) 03/25/2024   CL 101 03/25/2024   CO2 23 03/25/2024    Lab Results  Component Value Date   ALT 15 02/22/2023   AST 14 02/22/2023   ALKPHOS 46 02/22/2023   BILITOT 0.4 02/22/2023    Lab Results  Component Value Date   CHOL 142 02/14/2024   HDL 37.30 (L) 02/14/2024   LDLCALC 57 02/14/2024   LDLDIRECT 81.0 02/22/2023   TRIG 242.0 (H) 02/14/2024   CHOLHDL 4 02/14/2024   No results found for:  "LABRPR", "RPRTITER" No results found for: "HIV1RNAQUANT", "HIV1RNAVL", "CD4TABS"   Assessment/Plan 78 Y O male with prior h/o HLD, HTN, BPH/Prostate ca, OA, DM2, B/L Total knee arthroplasty ( rt 04/26/2020 and left 09/25/2022) admitted with    # Left knee PJI  4/4 I&D with exploration and open arthrotomy and synovectomy of left knee, left knee polyliner exchange.  Findings-large cloudy joint effusion with questionable purulent material, Gram stain and culture of the fluid and soft tissues with MSSE  Plan  - complete 6 weeks of IV cefazolin  as planned through 5/16. Counseled on preference to complete 6 weeks IV over earlier switch to PO antibiotics. Re-sent rifampin  300mg  po bid in generic form to start taking.  - Fu in 3 weeks prior to EOT and switch to PO after EOT - Fu with orthopedics as instructed   # Medication Monitoring  - labs done by home health, unavailable and have requested   # PICC  - no concerns   I have personally spent 31 minutes involved in face-to-face and non-face-to-face activities for this patient on the day of the visit. Professional time spent includes the following activities: Preparing to see the patient (review of tests), Obtaining and/or reviewing separately obtained history (admission/discharge record), Performing a medically appropriate examination and/or evaluation , Ordering medications/tests/procedures, referring and communicating with other health care professionals, Documenting clinical information in the EMR, Independently interpreting results (not separately reported), Communicating results to the patient/family/caregiver, Counseling and educating the patient/family/caregiver and Care coordination (not separately reported).   Of note, portions of this note may have been created with voice recognition software. While this note has been edited for accuracy, occasional wrong-word or 'sound-a-like' substitutions may have occurred due to the inherent limitations of  voice recognition software.   Melvina Stage, MD [facility] 04/11/2024, 2:49 PM

## 2024-04-12 DIAGNOSIS — E782 Mixed hyperlipidemia: Secondary | ICD-10-CM | POA: Diagnosis not present

## 2024-04-12 DIAGNOSIS — I10 Essential (primary) hypertension: Secondary | ICD-10-CM | POA: Diagnosis not present

## 2024-04-12 DIAGNOSIS — E119 Type 2 diabetes mellitus without complications: Secondary | ICD-10-CM | POA: Diagnosis not present

## 2024-04-12 DIAGNOSIS — D6859 Other primary thrombophilia: Secondary | ICD-10-CM | POA: Diagnosis not present

## 2024-04-12 DIAGNOSIS — I4510 Unspecified right bundle-branch block: Secondary | ICD-10-CM | POA: Diagnosis not present

## 2024-04-12 DIAGNOSIS — Z96651 Presence of right artificial knee joint: Secondary | ICD-10-CM | POA: Diagnosis not present

## 2024-04-12 DIAGNOSIS — Z7982 Long term (current) use of aspirin: Secondary | ICD-10-CM | POA: Diagnosis not present

## 2024-04-12 DIAGNOSIS — M159 Polyosteoarthritis, unspecified: Secondary | ICD-10-CM | POA: Diagnosis not present

## 2024-04-12 DIAGNOSIS — N138 Other obstructive and reflux uropathy: Secondary | ICD-10-CM | POA: Diagnosis not present

## 2024-04-12 DIAGNOSIS — B957 Other staphylococcus as the cause of diseases classified elsewhere: Secondary | ICD-10-CM | POA: Diagnosis not present

## 2024-04-12 DIAGNOSIS — Z9181 History of falling: Secondary | ICD-10-CM | POA: Diagnosis not present

## 2024-04-12 DIAGNOSIS — D630 Anemia in neoplastic disease: Secondary | ICD-10-CM | POA: Diagnosis not present

## 2024-04-12 DIAGNOSIS — M6281 Muscle weakness (generalized): Secondary | ICD-10-CM | POA: Diagnosis not present

## 2024-04-12 DIAGNOSIS — T8454XA Infection and inflammatory reaction due to internal left knee prosthesis, initial encounter: Secondary | ICD-10-CM | POA: Diagnosis not present

## 2024-04-12 DIAGNOSIS — Z792 Long term (current) use of antibiotics: Secondary | ICD-10-CM | POA: Diagnosis not present

## 2024-04-12 DIAGNOSIS — Z452 Encounter for adjustment and management of vascular access device: Secondary | ICD-10-CM | POA: Diagnosis not present

## 2024-04-12 DIAGNOSIS — C61 Malignant neoplasm of prostate: Secondary | ICD-10-CM | POA: Diagnosis not present

## 2024-04-12 DIAGNOSIS — N401 Enlarged prostate with lower urinary tract symptoms: Secondary | ICD-10-CM | POA: Diagnosis not present

## 2024-04-12 DIAGNOSIS — Z7984 Long term (current) use of oral hypoglycemic drugs: Secondary | ICD-10-CM | POA: Diagnosis not present

## 2024-04-12 DIAGNOSIS — Z8611 Personal history of tuberculosis: Secondary | ICD-10-CM | POA: Diagnosis not present

## 2024-04-12 DIAGNOSIS — M65162 Other infective (teno)synovitis, left knee: Secondary | ICD-10-CM | POA: Diagnosis not present

## 2024-04-13 DIAGNOSIS — T8454XA Infection and inflammatory reaction due to internal left knee prosthesis, initial encounter: Secondary | ICD-10-CM | POA: Diagnosis not present

## 2024-04-13 DIAGNOSIS — I10 Essential (primary) hypertension: Secondary | ICD-10-CM | POA: Diagnosis not present

## 2024-04-13 DIAGNOSIS — E119 Type 2 diabetes mellitus without complications: Secondary | ICD-10-CM | POA: Diagnosis not present

## 2024-04-13 DIAGNOSIS — N138 Other obstructive and reflux uropathy: Secondary | ICD-10-CM | POA: Diagnosis not present

## 2024-04-13 DIAGNOSIS — Z9181 History of falling: Secondary | ICD-10-CM | POA: Diagnosis not present

## 2024-04-13 DIAGNOSIS — Z7982 Long term (current) use of aspirin: Secondary | ICD-10-CM | POA: Diagnosis not present

## 2024-04-13 DIAGNOSIS — C61 Malignant neoplasm of prostate: Secondary | ICD-10-CM | POA: Diagnosis not present

## 2024-04-13 DIAGNOSIS — N401 Enlarged prostate with lower urinary tract symptoms: Secondary | ICD-10-CM | POA: Diagnosis not present

## 2024-04-13 DIAGNOSIS — Z96651 Presence of right artificial knee joint: Secondary | ICD-10-CM | POA: Diagnosis not present

## 2024-04-13 DIAGNOSIS — E782 Mixed hyperlipidemia: Secondary | ICD-10-CM | POA: Diagnosis not present

## 2024-04-13 DIAGNOSIS — M6281 Muscle weakness (generalized): Secondary | ICD-10-CM | POA: Diagnosis not present

## 2024-04-13 DIAGNOSIS — I4510 Unspecified right bundle-branch block: Secondary | ICD-10-CM | POA: Diagnosis not present

## 2024-04-13 DIAGNOSIS — Z452 Encounter for adjustment and management of vascular access device: Secondary | ICD-10-CM | POA: Diagnosis not present

## 2024-04-13 DIAGNOSIS — D6859 Other primary thrombophilia: Secondary | ICD-10-CM | POA: Diagnosis not present

## 2024-04-13 DIAGNOSIS — Z8611 Personal history of tuberculosis: Secondary | ICD-10-CM | POA: Diagnosis not present

## 2024-04-13 DIAGNOSIS — M159 Polyosteoarthritis, unspecified: Secondary | ICD-10-CM | POA: Diagnosis not present

## 2024-04-13 DIAGNOSIS — Z7984 Long term (current) use of oral hypoglycemic drugs: Secondary | ICD-10-CM | POA: Diagnosis not present

## 2024-04-13 DIAGNOSIS — B957 Other staphylococcus as the cause of diseases classified elsewhere: Secondary | ICD-10-CM | POA: Diagnosis not present

## 2024-04-13 DIAGNOSIS — Z792 Long term (current) use of antibiotics: Secondary | ICD-10-CM | POA: Diagnosis not present

## 2024-04-13 DIAGNOSIS — M65162 Other infective (teno)synovitis, left knee: Secondary | ICD-10-CM | POA: Diagnosis not present

## 2024-04-13 DIAGNOSIS — D630 Anemia in neoplastic disease: Secondary | ICD-10-CM | POA: Diagnosis not present

## 2024-04-17 DIAGNOSIS — M159 Polyosteoarthritis, unspecified: Secondary | ICD-10-CM | POA: Diagnosis not present

## 2024-04-17 DIAGNOSIS — M65162 Other infective (teno)synovitis, left knee: Secondary | ICD-10-CM | POA: Diagnosis not present

## 2024-04-17 DIAGNOSIS — D6859 Other primary thrombophilia: Secondary | ICD-10-CM | POA: Diagnosis not present

## 2024-04-17 DIAGNOSIS — T8454XA Infection and inflammatory reaction due to internal left knee prosthesis, initial encounter: Secondary | ICD-10-CM | POA: Diagnosis not present

## 2024-04-17 DIAGNOSIS — Z792 Long term (current) use of antibiotics: Secondary | ICD-10-CM | POA: Diagnosis not present

## 2024-04-17 DIAGNOSIS — Z452 Encounter for adjustment and management of vascular access device: Secondary | ICD-10-CM | POA: Diagnosis not present

## 2024-04-17 DIAGNOSIS — Z7984 Long term (current) use of oral hypoglycemic drugs: Secondary | ICD-10-CM | POA: Diagnosis not present

## 2024-04-17 DIAGNOSIS — Z7982 Long term (current) use of aspirin: Secondary | ICD-10-CM | POA: Diagnosis not present

## 2024-04-17 DIAGNOSIS — N401 Enlarged prostate with lower urinary tract symptoms: Secondary | ICD-10-CM | POA: Diagnosis not present

## 2024-04-17 DIAGNOSIS — E782 Mixed hyperlipidemia: Secondary | ICD-10-CM | POA: Diagnosis not present

## 2024-04-17 DIAGNOSIS — I4510 Unspecified right bundle-branch block: Secondary | ICD-10-CM | POA: Diagnosis not present

## 2024-04-17 DIAGNOSIS — C61 Malignant neoplasm of prostate: Secondary | ICD-10-CM | POA: Diagnosis not present

## 2024-04-17 DIAGNOSIS — M6281 Muscle weakness (generalized): Secondary | ICD-10-CM | POA: Diagnosis not present

## 2024-04-17 DIAGNOSIS — I10 Essential (primary) hypertension: Secondary | ICD-10-CM | POA: Diagnosis not present

## 2024-04-17 DIAGNOSIS — Z8611 Personal history of tuberculosis: Secondary | ICD-10-CM | POA: Diagnosis not present

## 2024-04-17 DIAGNOSIS — B957 Other staphylococcus as the cause of diseases classified elsewhere: Secondary | ICD-10-CM | POA: Diagnosis not present

## 2024-04-17 DIAGNOSIS — Z96651 Presence of right artificial knee joint: Secondary | ICD-10-CM | POA: Diagnosis not present

## 2024-04-17 DIAGNOSIS — N138 Other obstructive and reflux uropathy: Secondary | ICD-10-CM | POA: Diagnosis not present

## 2024-04-17 DIAGNOSIS — D630 Anemia in neoplastic disease: Secondary | ICD-10-CM | POA: Diagnosis not present

## 2024-04-17 DIAGNOSIS — E119 Type 2 diabetes mellitus without complications: Secondary | ICD-10-CM | POA: Diagnosis not present

## 2024-04-17 DIAGNOSIS — Z9181 History of falling: Secondary | ICD-10-CM | POA: Diagnosis not present

## 2024-04-18 ENCOUNTER — Encounter: Payer: Self-pay | Admitting: *Deleted

## 2024-04-18 DIAGNOSIS — E782 Mixed hyperlipidemia: Secondary | ICD-10-CM | POA: Diagnosis not present

## 2024-04-18 DIAGNOSIS — C61 Malignant neoplasm of prostate: Secondary | ICD-10-CM | POA: Diagnosis not present

## 2024-04-18 DIAGNOSIS — Z96651 Presence of right artificial knee joint: Secondary | ICD-10-CM | POA: Diagnosis not present

## 2024-04-18 DIAGNOSIS — M65162 Other infective (teno)synovitis, left knee: Secondary | ICD-10-CM | POA: Diagnosis not present

## 2024-04-18 DIAGNOSIS — I4510 Unspecified right bundle-branch block: Secondary | ICD-10-CM | POA: Diagnosis not present

## 2024-04-18 DIAGNOSIS — I10 Essential (primary) hypertension: Secondary | ICD-10-CM | POA: Diagnosis not present

## 2024-04-18 DIAGNOSIS — Z9181 History of falling: Secondary | ICD-10-CM | POA: Diagnosis not present

## 2024-04-18 DIAGNOSIS — Z452 Encounter for adjustment and management of vascular access device: Secondary | ICD-10-CM | POA: Diagnosis not present

## 2024-04-18 DIAGNOSIS — E119 Type 2 diabetes mellitus without complications: Secondary | ICD-10-CM | POA: Diagnosis not present

## 2024-04-18 DIAGNOSIS — Z7982 Long term (current) use of aspirin: Secondary | ICD-10-CM | POA: Diagnosis not present

## 2024-04-18 DIAGNOSIS — N401 Enlarged prostate with lower urinary tract symptoms: Secondary | ICD-10-CM | POA: Diagnosis not present

## 2024-04-18 DIAGNOSIS — Z792 Long term (current) use of antibiotics: Secondary | ICD-10-CM | POA: Diagnosis not present

## 2024-04-18 DIAGNOSIS — M6281 Muscle weakness (generalized): Secondary | ICD-10-CM | POA: Diagnosis not present

## 2024-04-18 DIAGNOSIS — N138 Other obstructive and reflux uropathy: Secondary | ICD-10-CM | POA: Diagnosis not present

## 2024-04-18 DIAGNOSIS — B957 Other staphylococcus as the cause of diseases classified elsewhere: Secondary | ICD-10-CM | POA: Diagnosis not present

## 2024-04-18 DIAGNOSIS — Z8611 Personal history of tuberculosis: Secondary | ICD-10-CM | POA: Diagnosis not present

## 2024-04-18 DIAGNOSIS — M159 Polyosteoarthritis, unspecified: Secondary | ICD-10-CM | POA: Diagnosis not present

## 2024-04-18 DIAGNOSIS — Z7984 Long term (current) use of oral hypoglycemic drugs: Secondary | ICD-10-CM | POA: Diagnosis not present

## 2024-04-18 DIAGNOSIS — D630 Anemia in neoplastic disease: Secondary | ICD-10-CM | POA: Diagnosis not present

## 2024-04-18 DIAGNOSIS — D6859 Other primary thrombophilia: Secondary | ICD-10-CM | POA: Diagnosis not present

## 2024-04-18 DIAGNOSIS — T8454XA Infection and inflammatory reaction due to internal left knee prosthesis, initial encounter: Secondary | ICD-10-CM | POA: Diagnosis not present

## 2024-04-20 DIAGNOSIS — M65162 Other infective (teno)synovitis, left knee: Secondary | ICD-10-CM | POA: Diagnosis not present

## 2024-04-20 DIAGNOSIS — I10 Essential (primary) hypertension: Secondary | ICD-10-CM | POA: Diagnosis not present

## 2024-04-20 DIAGNOSIS — B957 Other staphylococcus as the cause of diseases classified elsewhere: Secondary | ICD-10-CM | POA: Diagnosis not present

## 2024-04-20 DIAGNOSIS — Z7982 Long term (current) use of aspirin: Secondary | ICD-10-CM | POA: Diagnosis not present

## 2024-04-20 DIAGNOSIS — Z8611 Personal history of tuberculosis: Secondary | ICD-10-CM | POA: Diagnosis not present

## 2024-04-20 DIAGNOSIS — Z9181 History of falling: Secondary | ICD-10-CM | POA: Diagnosis not present

## 2024-04-20 DIAGNOSIS — I4510 Unspecified right bundle-branch block: Secondary | ICD-10-CM | POA: Diagnosis not present

## 2024-04-20 DIAGNOSIS — E782 Mixed hyperlipidemia: Secondary | ICD-10-CM | POA: Diagnosis not present

## 2024-04-20 DIAGNOSIS — E119 Type 2 diabetes mellitus without complications: Secondary | ICD-10-CM | POA: Diagnosis not present

## 2024-04-20 DIAGNOSIS — Z792 Long term (current) use of antibiotics: Secondary | ICD-10-CM | POA: Diagnosis not present

## 2024-04-20 DIAGNOSIS — N138 Other obstructive and reflux uropathy: Secondary | ICD-10-CM | POA: Diagnosis not present

## 2024-04-20 DIAGNOSIS — Z452 Encounter for adjustment and management of vascular access device: Secondary | ICD-10-CM | POA: Diagnosis not present

## 2024-04-20 DIAGNOSIS — T8454XA Infection and inflammatory reaction due to internal left knee prosthesis, initial encounter: Secondary | ICD-10-CM | POA: Diagnosis not present

## 2024-04-20 DIAGNOSIS — M159 Polyosteoarthritis, unspecified: Secondary | ICD-10-CM | POA: Diagnosis not present

## 2024-04-20 DIAGNOSIS — D630 Anemia in neoplastic disease: Secondary | ICD-10-CM | POA: Diagnosis not present

## 2024-04-20 DIAGNOSIS — C61 Malignant neoplasm of prostate: Secondary | ICD-10-CM | POA: Diagnosis not present

## 2024-04-20 DIAGNOSIS — M6281 Muscle weakness (generalized): Secondary | ICD-10-CM | POA: Diagnosis not present

## 2024-04-20 DIAGNOSIS — D6859 Other primary thrombophilia: Secondary | ICD-10-CM | POA: Diagnosis not present

## 2024-04-20 DIAGNOSIS — Z7984 Long term (current) use of oral hypoglycemic drugs: Secondary | ICD-10-CM | POA: Diagnosis not present

## 2024-04-20 DIAGNOSIS — Z96651 Presence of right artificial knee joint: Secondary | ICD-10-CM | POA: Diagnosis not present

## 2024-04-20 DIAGNOSIS — N401 Enlarged prostate with lower urinary tract symptoms: Secondary | ICD-10-CM | POA: Diagnosis not present

## 2024-04-24 DIAGNOSIS — I10 Essential (primary) hypertension: Secondary | ICD-10-CM | POA: Diagnosis not present

## 2024-04-24 DIAGNOSIS — T8454XA Infection and inflammatory reaction due to internal left knee prosthesis, initial encounter: Secondary | ICD-10-CM | POA: Diagnosis not present

## 2024-04-24 DIAGNOSIS — D6859 Other primary thrombophilia: Secondary | ICD-10-CM | POA: Diagnosis not present

## 2024-04-24 DIAGNOSIS — Z7984 Long term (current) use of oral hypoglycemic drugs: Secondary | ICD-10-CM | POA: Diagnosis not present

## 2024-04-24 DIAGNOSIS — Z96651 Presence of right artificial knee joint: Secondary | ICD-10-CM | POA: Diagnosis not present

## 2024-04-24 DIAGNOSIS — Z7982 Long term (current) use of aspirin: Secondary | ICD-10-CM | POA: Diagnosis not present

## 2024-04-24 DIAGNOSIS — E119 Type 2 diabetes mellitus without complications: Secondary | ICD-10-CM | POA: Diagnosis not present

## 2024-04-24 DIAGNOSIS — M65162 Other infective (teno)synovitis, left knee: Secondary | ICD-10-CM | POA: Diagnosis not present

## 2024-04-24 DIAGNOSIS — M6281 Muscle weakness (generalized): Secondary | ICD-10-CM | POA: Diagnosis not present

## 2024-04-24 DIAGNOSIS — N401 Enlarged prostate with lower urinary tract symptoms: Secondary | ICD-10-CM | POA: Diagnosis not present

## 2024-04-24 DIAGNOSIS — Z8611 Personal history of tuberculosis: Secondary | ICD-10-CM | POA: Diagnosis not present

## 2024-04-24 DIAGNOSIS — B957 Other staphylococcus as the cause of diseases classified elsewhere: Secondary | ICD-10-CM | POA: Diagnosis not present

## 2024-04-24 DIAGNOSIS — Z792 Long term (current) use of antibiotics: Secondary | ICD-10-CM | POA: Diagnosis not present

## 2024-04-24 DIAGNOSIS — N138 Other obstructive and reflux uropathy: Secondary | ICD-10-CM | POA: Diagnosis not present

## 2024-04-24 DIAGNOSIS — D630 Anemia in neoplastic disease: Secondary | ICD-10-CM | POA: Diagnosis not present

## 2024-04-24 DIAGNOSIS — I4510 Unspecified right bundle-branch block: Secondary | ICD-10-CM | POA: Diagnosis not present

## 2024-04-24 DIAGNOSIS — E782 Mixed hyperlipidemia: Secondary | ICD-10-CM | POA: Diagnosis not present

## 2024-04-24 DIAGNOSIS — M159 Polyosteoarthritis, unspecified: Secondary | ICD-10-CM | POA: Diagnosis not present

## 2024-04-24 DIAGNOSIS — C61 Malignant neoplasm of prostate: Secondary | ICD-10-CM | POA: Diagnosis not present

## 2024-04-24 DIAGNOSIS — Z9181 History of falling: Secondary | ICD-10-CM | POA: Diagnosis not present

## 2024-04-24 DIAGNOSIS — Z452 Encounter for adjustment and management of vascular access device: Secondary | ICD-10-CM | POA: Diagnosis not present

## 2024-04-25 DIAGNOSIS — Z452 Encounter for adjustment and management of vascular access device: Secondary | ICD-10-CM | POA: Diagnosis not present

## 2024-04-25 DIAGNOSIS — N138 Other obstructive and reflux uropathy: Secondary | ICD-10-CM | POA: Diagnosis not present

## 2024-04-25 DIAGNOSIS — Z9181 History of falling: Secondary | ICD-10-CM | POA: Diagnosis not present

## 2024-04-25 DIAGNOSIS — D6859 Other primary thrombophilia: Secondary | ICD-10-CM | POA: Diagnosis not present

## 2024-04-25 DIAGNOSIS — N401 Enlarged prostate with lower urinary tract symptoms: Secondary | ICD-10-CM | POA: Diagnosis not present

## 2024-04-25 DIAGNOSIS — M65162 Other infective (teno)synovitis, left knee: Secondary | ICD-10-CM | POA: Diagnosis not present

## 2024-04-25 DIAGNOSIS — M6281 Muscle weakness (generalized): Secondary | ICD-10-CM | POA: Diagnosis not present

## 2024-04-25 DIAGNOSIS — E119 Type 2 diabetes mellitus without complications: Secondary | ICD-10-CM | POA: Diagnosis not present

## 2024-04-25 DIAGNOSIS — M159 Polyosteoarthritis, unspecified: Secondary | ICD-10-CM | POA: Diagnosis not present

## 2024-04-25 DIAGNOSIS — Z8611 Personal history of tuberculosis: Secondary | ICD-10-CM | POA: Diagnosis not present

## 2024-04-25 DIAGNOSIS — B957 Other staphylococcus as the cause of diseases classified elsewhere: Secondary | ICD-10-CM | POA: Diagnosis not present

## 2024-04-25 DIAGNOSIS — I4510 Unspecified right bundle-branch block: Secondary | ICD-10-CM | POA: Diagnosis not present

## 2024-04-25 DIAGNOSIS — Z96651 Presence of right artificial knee joint: Secondary | ICD-10-CM | POA: Diagnosis not present

## 2024-04-25 DIAGNOSIS — C61 Malignant neoplasm of prostate: Secondary | ICD-10-CM | POA: Diagnosis not present

## 2024-04-25 DIAGNOSIS — I10 Essential (primary) hypertension: Secondary | ICD-10-CM | POA: Diagnosis not present

## 2024-04-25 DIAGNOSIS — D630 Anemia in neoplastic disease: Secondary | ICD-10-CM | POA: Diagnosis not present

## 2024-04-25 DIAGNOSIS — Z7984 Long term (current) use of oral hypoglycemic drugs: Secondary | ICD-10-CM | POA: Diagnosis not present

## 2024-04-25 DIAGNOSIS — T8454XA Infection and inflammatory reaction due to internal left knee prosthesis, initial encounter: Secondary | ICD-10-CM | POA: Diagnosis not present

## 2024-04-25 DIAGNOSIS — E782 Mixed hyperlipidemia: Secondary | ICD-10-CM | POA: Diagnosis not present

## 2024-04-25 DIAGNOSIS — Z7982 Long term (current) use of aspirin: Secondary | ICD-10-CM | POA: Diagnosis not present

## 2024-04-25 DIAGNOSIS — Z792 Long term (current) use of antibiotics: Secondary | ICD-10-CM | POA: Diagnosis not present

## 2024-04-27 DIAGNOSIS — I4510 Unspecified right bundle-branch block: Secondary | ICD-10-CM | POA: Diagnosis not present

## 2024-04-27 DIAGNOSIS — B957 Other staphylococcus as the cause of diseases classified elsewhere: Secondary | ICD-10-CM | POA: Diagnosis not present

## 2024-04-27 DIAGNOSIS — Z9181 History of falling: Secondary | ICD-10-CM | POA: Diagnosis not present

## 2024-04-27 DIAGNOSIS — E782 Mixed hyperlipidemia: Secondary | ICD-10-CM | POA: Diagnosis not present

## 2024-04-27 DIAGNOSIS — Z792 Long term (current) use of antibiotics: Secondary | ICD-10-CM | POA: Diagnosis not present

## 2024-04-27 DIAGNOSIS — T8454XA Infection and inflammatory reaction due to internal left knee prosthesis, initial encounter: Secondary | ICD-10-CM | POA: Diagnosis not present

## 2024-04-27 DIAGNOSIS — N138 Other obstructive and reflux uropathy: Secondary | ICD-10-CM | POA: Diagnosis not present

## 2024-04-27 DIAGNOSIS — D630 Anemia in neoplastic disease: Secondary | ICD-10-CM | POA: Diagnosis not present

## 2024-04-27 DIAGNOSIS — Z7982 Long term (current) use of aspirin: Secondary | ICD-10-CM | POA: Diagnosis not present

## 2024-04-27 DIAGNOSIS — C61 Malignant neoplasm of prostate: Secondary | ICD-10-CM | POA: Diagnosis not present

## 2024-04-27 DIAGNOSIS — N401 Enlarged prostate with lower urinary tract symptoms: Secondary | ICD-10-CM | POA: Diagnosis not present

## 2024-04-27 DIAGNOSIS — I10 Essential (primary) hypertension: Secondary | ICD-10-CM | POA: Diagnosis not present

## 2024-04-27 DIAGNOSIS — Z8611 Personal history of tuberculosis: Secondary | ICD-10-CM | POA: Diagnosis not present

## 2024-04-27 DIAGNOSIS — D6859 Other primary thrombophilia: Secondary | ICD-10-CM | POA: Diagnosis not present

## 2024-04-27 DIAGNOSIS — E119 Type 2 diabetes mellitus without complications: Secondary | ICD-10-CM | POA: Diagnosis not present

## 2024-04-27 DIAGNOSIS — M65162 Other infective (teno)synovitis, left knee: Secondary | ICD-10-CM | POA: Diagnosis not present

## 2024-04-27 DIAGNOSIS — M159 Polyosteoarthritis, unspecified: Secondary | ICD-10-CM | POA: Diagnosis not present

## 2024-04-27 DIAGNOSIS — Z452 Encounter for adjustment and management of vascular access device: Secondary | ICD-10-CM | POA: Diagnosis not present

## 2024-04-27 DIAGNOSIS — Z7984 Long term (current) use of oral hypoglycemic drugs: Secondary | ICD-10-CM | POA: Diagnosis not present

## 2024-04-27 DIAGNOSIS — M6281 Muscle weakness (generalized): Secondary | ICD-10-CM | POA: Diagnosis not present

## 2024-04-27 DIAGNOSIS — Z96651 Presence of right artificial knee joint: Secondary | ICD-10-CM | POA: Diagnosis not present

## 2024-05-01 DIAGNOSIS — E782 Mixed hyperlipidemia: Secondary | ICD-10-CM | POA: Diagnosis not present

## 2024-05-01 DIAGNOSIS — Z7984 Long term (current) use of oral hypoglycemic drugs: Secondary | ICD-10-CM | POA: Diagnosis not present

## 2024-05-01 DIAGNOSIS — N401 Enlarged prostate with lower urinary tract symptoms: Secondary | ICD-10-CM | POA: Diagnosis not present

## 2024-05-01 DIAGNOSIS — E119 Type 2 diabetes mellitus without complications: Secondary | ICD-10-CM | POA: Diagnosis not present

## 2024-05-01 DIAGNOSIS — M65162 Other infective (teno)synovitis, left knee: Secondary | ICD-10-CM | POA: Diagnosis not present

## 2024-05-01 DIAGNOSIS — B957 Other staphylococcus as the cause of diseases classified elsewhere: Secondary | ICD-10-CM | POA: Diagnosis not present

## 2024-05-01 DIAGNOSIS — T8454XA Infection and inflammatory reaction due to internal left knee prosthesis, initial encounter: Secondary | ICD-10-CM | POA: Diagnosis not present

## 2024-05-01 DIAGNOSIS — Z792 Long term (current) use of antibiotics: Secondary | ICD-10-CM | POA: Diagnosis not present

## 2024-05-01 DIAGNOSIS — I4510 Unspecified right bundle-branch block: Secondary | ICD-10-CM | POA: Diagnosis not present

## 2024-05-01 DIAGNOSIS — Z7982 Long term (current) use of aspirin: Secondary | ICD-10-CM | POA: Diagnosis not present

## 2024-05-01 DIAGNOSIS — Z96651 Presence of right artificial knee joint: Secondary | ICD-10-CM | POA: Diagnosis not present

## 2024-05-01 DIAGNOSIS — C61 Malignant neoplasm of prostate: Secondary | ICD-10-CM | POA: Diagnosis not present

## 2024-05-01 DIAGNOSIS — Z8611 Personal history of tuberculosis: Secondary | ICD-10-CM | POA: Diagnosis not present

## 2024-05-01 DIAGNOSIS — D630 Anemia in neoplastic disease: Secondary | ICD-10-CM | POA: Diagnosis not present

## 2024-05-01 DIAGNOSIS — D6859 Other primary thrombophilia: Secondary | ICD-10-CM | POA: Diagnosis not present

## 2024-05-01 DIAGNOSIS — N138 Other obstructive and reflux uropathy: Secondary | ICD-10-CM | POA: Diagnosis not present

## 2024-05-01 DIAGNOSIS — M159 Polyosteoarthritis, unspecified: Secondary | ICD-10-CM | POA: Diagnosis not present

## 2024-05-01 DIAGNOSIS — M6281 Muscle weakness (generalized): Secondary | ICD-10-CM | POA: Diagnosis not present

## 2024-05-01 DIAGNOSIS — I10 Essential (primary) hypertension: Secondary | ICD-10-CM | POA: Diagnosis not present

## 2024-05-01 DIAGNOSIS — Z9181 History of falling: Secondary | ICD-10-CM | POA: Diagnosis not present

## 2024-05-01 DIAGNOSIS — Z452 Encounter for adjustment and management of vascular access device: Secondary | ICD-10-CM | POA: Diagnosis not present

## 2024-05-02 ENCOUNTER — Telehealth: Payer: Self-pay

## 2024-05-02 ENCOUNTER — Ambulatory Visit: Admitting: Infectious Diseases

## 2024-05-02 ENCOUNTER — Encounter: Payer: Self-pay | Admitting: Infectious Diseases

## 2024-05-02 ENCOUNTER — Other Ambulatory Visit: Payer: Self-pay

## 2024-05-02 VITALS — BP 123/67 | HR 83 | Temp 97.9°F | Wt 187.6 lb

## 2024-05-02 DIAGNOSIS — T8454XD Infection and inflammatory reaction due to internal left knee prosthesis, subsequent encounter: Secondary | ICD-10-CM | POA: Diagnosis not present

## 2024-05-02 DIAGNOSIS — Z452 Encounter for adjustment and management of vascular access device: Secondary | ICD-10-CM | POA: Insufficient documentation

## 2024-05-02 DIAGNOSIS — N138 Other obstructive and reflux uropathy: Secondary | ICD-10-CM | POA: Diagnosis not present

## 2024-05-02 DIAGNOSIS — T8454XA Infection and inflammatory reaction due to internal left knee prosthesis, initial encounter: Secondary | ICD-10-CM | POA: Diagnosis not present

## 2024-05-02 DIAGNOSIS — M6281 Muscle weakness (generalized): Secondary | ICD-10-CM | POA: Diagnosis not present

## 2024-05-02 DIAGNOSIS — E782 Mixed hyperlipidemia: Secondary | ICD-10-CM | POA: Diagnosis not present

## 2024-05-02 DIAGNOSIS — M159 Polyosteoarthritis, unspecified: Secondary | ICD-10-CM | POA: Diagnosis not present

## 2024-05-02 DIAGNOSIS — D630 Anemia in neoplastic disease: Secondary | ICD-10-CM | POA: Diagnosis not present

## 2024-05-02 DIAGNOSIS — B957 Other staphylococcus as the cause of diseases classified elsewhere: Secondary | ICD-10-CM | POA: Diagnosis not present

## 2024-05-02 DIAGNOSIS — Z8611 Personal history of tuberculosis: Secondary | ICD-10-CM | POA: Diagnosis not present

## 2024-05-02 DIAGNOSIS — Z96651 Presence of right artificial knee joint: Secondary | ICD-10-CM | POA: Diagnosis not present

## 2024-05-02 DIAGNOSIS — Z79899 Other long term (current) drug therapy: Secondary | ICD-10-CM

## 2024-05-02 DIAGNOSIS — Z7984 Long term (current) use of oral hypoglycemic drugs: Secondary | ICD-10-CM | POA: Diagnosis not present

## 2024-05-02 DIAGNOSIS — I4510 Unspecified right bundle-branch block: Secondary | ICD-10-CM | POA: Diagnosis not present

## 2024-05-02 DIAGNOSIS — T8450XD Infection and inflammatory reaction due to unspecified internal joint prosthesis, subsequent encounter: Secondary | ICD-10-CM

## 2024-05-02 DIAGNOSIS — Z792 Long term (current) use of antibiotics: Secondary | ICD-10-CM | POA: Diagnosis not present

## 2024-05-02 DIAGNOSIS — N401 Enlarged prostate with lower urinary tract symptoms: Secondary | ICD-10-CM | POA: Diagnosis not present

## 2024-05-02 DIAGNOSIS — M65162 Other infective (teno)synovitis, left knee: Secondary | ICD-10-CM | POA: Diagnosis not present

## 2024-05-02 DIAGNOSIS — Z7982 Long term (current) use of aspirin: Secondary | ICD-10-CM | POA: Diagnosis not present

## 2024-05-02 DIAGNOSIS — I10 Essential (primary) hypertension: Secondary | ICD-10-CM | POA: Diagnosis not present

## 2024-05-02 DIAGNOSIS — E119 Type 2 diabetes mellitus without complications: Secondary | ICD-10-CM | POA: Diagnosis not present

## 2024-05-02 DIAGNOSIS — C61 Malignant neoplasm of prostate: Secondary | ICD-10-CM | POA: Diagnosis not present

## 2024-05-02 DIAGNOSIS — D6859 Other primary thrombophilia: Secondary | ICD-10-CM | POA: Diagnosis not present

## 2024-05-02 DIAGNOSIS — Z9181 History of falling: Secondary | ICD-10-CM | POA: Diagnosis not present

## 2024-05-02 HISTORY — DX: Other long term (current) drug therapy: Z79.899

## 2024-05-02 MED ORDER — RIFAMPIN 300 MG PO CAPS: 300.0000 mg | ORAL_CAPSULE | Freq: Two times a day (BID) | ORAL | 0 refills | Status: DC

## 2024-05-02 MED ORDER — CEFADROXIL 500 MG PO CAPS: 1000.0000 mg | ORAL_CAPSULE | Freq: Two times a day (BID) | ORAL | 5 refills | Status: AC

## 2024-05-02 NOTE — Telephone Encounter (Signed)
 Received update from [employer] stating patient's picc was out and only in a few centimeters. Picc is not usable. Per Dr. Merribeth Abbott okay to remove picc and will discuss at appt today. Julien Odor, RMA

## 2024-05-02 NOTE — Progress Notes (Unsigned)
 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   Prostate cancer (HCC); GG 5; PSA 97.2; very high risk 10/06/2023   Low testosterone  03/03/2023   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   Elevated PSA 02/23/2023   Left leg swelling 10/05/2022   History of left knee replacement 09/25/2022   Chest pain 05/26/2022   Unilateral primary osteoarthritis, left knee 04/08/2022   Synovitis of left knee 03/26/2022   Rash 03/26/2022   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/26/2020   Hypokalemia 01/12/2019   DM (diabetes mellitus) type II, controlled, with peripheral vascular disorder (HCC) 09/16/2017   Essential hypertension 01/18/2014   Hyperlipidemia LDL goal <70 01/18/2014   Obesity (BMI 30-39.9) 01/18/2014    Patient's Medications  New Prescriptions   No medications on file  Previous Medications   ASCORBIC ACID  (VITAMIN C ) 1000 MG TABLET    Take 1,000 mg by mouth 2 (two) times daily.   ASHWAGANDHA PO    Take 1 capsule by mouth daily.   ASPIRIN  81 MG CHEWABLE TABLET    Chew 1 tablet (81 mg total) by mouth 2 (two) times daily.   ATORVASTATIN  (LIPITOR) 10 MG TABLET    Take 1 tablet (10 mg total) by mouth daily.   B COMPLEX VITAMINS CAPSULE    Take 1 capsule by mouth daily.   CALCIUM  CARB-CHOLECALCIFEROL (CALCIUM  600 + D PO)    Take 1 tablet by mouth 3 (three) times a week.   CEFAZOLIN  (ANCEF ) IVPB    Inject 2 g into the vein every 8 (eight) hours. Indication:  Left knee PJI  First Dose: Yes Last Day of Therapy:  05/05/24 Labs - Once weekly:  CBC/D and CMP, Labs - Once weekly: ESR and CRP   CHLORTHALIDONE  (HYGROTON ) 25 MG TABLET    Take 1 tablet by mouth once daily   FENOFIBRATE  54 MG TABLET     Take 1 tablet (54 mg total) by mouth daily.   FLAXSEED, LINSEED, (FLAX SEED OIL PO)    Take 1 capsule by mouth daily.   GLUCOSAMINE-CHONDROITIN PO    Take 1 tablet by mouth daily.   LORATADINE  (CLARITIN ) 10 MG TABLET    Take 1 tablet (10 mg total) by mouth daily.   LUTEIN PO    Take 1 capsule by mouth daily.   METFORMIN  (GLUCOPHAGE -XR) 500 MG 24 HR TABLET    TAKE 1 TABLET BY MOUTH TWICE DAILY WITH A MEAL   METHOCARBAMOL  (ROBAXIN ) 500 MG TABLET    Take 1 tablet (500 mg total) by mouth every 6 (six) hours as needed for muscle spasms.   METOPROLOL  TARTRATE (LOPRESSOR ) 100 MG TABLET    Take 1 tablet by mouth twice daily   NAPROXEN  SODIUM (ALEVE ) 220 MG TABLET    Take 440 mg by mouth 2 (two) times daily as needed.   OMEGA-3 ACID ETHYL ESTERS (LOVAZA ) 1 G CAPSULE    Take 2 capsules (2 g total) by mouth 2 (two) times daily.   OVER THE COUNTER MEDICATION    Take 1 capsule by mouth daily. Mens Prostate formula supplement   OXYCODONE  (OXY IR/ROXICODONE ) 5 MG IMMEDIATE RELEASE TABLET    Take 1-2 tablets (5-10 mg total) by mouth every 6 (six) hours as  needed for moderate pain (pain score 4-6) (pain score 4-6).   POTASSIUM PO    Take 1 tablet by mouth daily.   RIFAMPIN  (RIFADIN ) 300 MG CAPSULE    Take 1 capsule (300 mg total) by mouth 2 (two) times daily.   SOLIFENACIN  (VESICARE ) 5 MG TABLET    Take 1 tablet (5 mg total) by mouth daily.   [employer] JOHNS WORT PO    Take 1 tablet by mouth daily.   TAMSULOSIN  (FLOMAX ) 0.4 MG CAPS CAPSULE    Take 1 capsule (0.4 mg total) by mouth daily.   TRIAMCINOLONE OINTMENT (KENALOG) 0.1 %    Apply 1 Application topically 2 (two) times daily as needed for rash.   TURMERIC 500 MG CAPS    Take 500 mg by mouth daily.  Modified Medications   No medications on file  Discontinued Medications   No medications on file    Subjective: 78 Y O male with prior h/o HLD, HTN, BPH/Prostate ca, OA, DM2, B/L Total knee arthroplasty ( rt 04/26/2020 and left 09/25/2022) who is here after recent  hospital admission ( 4/3-4/9) for Left knee PJI. He was having recurrent swelling of the left knee in the last few months. He was seen in Ortho office for the same and on 03/09/24 fluid was aspirated from Left knee with 49K WBC, no growth in cultures. WBC with no leukcoytosis but ESR in 40s and CRP was 7. He reported being on Nitrofurantoin  since November 2024 for his urinary infection suppression. 4/4, underwent I&D with exploration and open arthrotomy and synovectomy of left knee, left knee polyliner exchange.  Findings-large cloudy joint effusion with questionable purulent material, Gram stain and culture of the fluid and soft tissues with MSSE. Discharged to SNF on 4/9 to complete 6 weeks of IV cefazolin  and Rifampin .   4/22 He has been discharged from SNF back to home and receiving IV cefazolin  through PICC without any concerns related to PICC or abtx. However he finds it difficult to do 3 injections in a day and asking if could switch to pills earlier. He also reports not taking PO Rifampin  due to high copay and asking to send the generic form. Seen by Dr Lucienne Ryder 4/17 and left knee healing with no concerns. An attempt at aspiration of left knee was done but no fluid and likely just congealed blood. He is ambulatory. Denies fevers, chills, nausea, vomiting, diarrhea, left knee pain or discharge.   5/13  Reports PICC came out while sleeping but no issues with antibiotics prior to that. Also taking PO rifampin  twice daily with no concerns.  Has not seen surgeon yet since last visit. No concerns at left knee. He was asking for refill of pain meds and he will be contacting Ortho offic e for that. No other concerns.    Review of Systems: all systems reviewed with pertinent positives and negatives as listed above  Past Medical History:  Diagnosis Date   BPH with obstruction/lower urinary tract symptoms    Hyperlipidemia, mixed    Hypertension    Limited active range of motion (AROM) of cervical spine  on rotation    Malignant neoplasm prostate [facility]) 08/2023   urologist-- dr stoneking/  radiation oncologist-- dr Lorri Rota;  dx 09/ 2024, gleason 4+5, psa 97.2,  vol 88.5   OA (osteoarthritis)    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   Type 2 diabetes mellitus (HCC)  followed by pcp   (11-10-2023  pt stated does not check blood sugar)   Wears glasses    Past Surgical History:  Procedure Laterality Date   ANTERIOR FUSION CERVICAL SPINE  1990   Thinks it was C-3-4  done here at [facility]   BILATERAL CARPAL TUNNEL RELEASE Bilateral    1990s   COLONOSCOPY  2011   EXCISIONAL TOTAL KNEE ARTHROPLASTY WITH ANTIBIOTIC SPACERS Left 03/24/2024   Procedure: LEFT KNEE SYNOVECTOMY, IRRIGATION AND DEBRIDEMENT WITH POLY EXCHANGE;  Surgeon: Arnie Lao, MD;  Location: [facility];  Service: Orthopedics;  Laterality: Left;   GOLD SEED IMPLANT N/A 11/23/2023   Procedure: GOLD SEED IMPLANT;  Surgeon: Mellie Sprinkle., MD;  [facility];  Service: Urology;  Laterality: N/A;   SPACE OAR INSTILLATION N/A 11/23/2023   Procedure: SPACE OAR INSTILLATION;  Surgeon: Mellie Sprinkle., MD;  [facility];  Service: Urology;  Laterality: N/A;   TOTAL KNEE ARTHROPLASTY Right 04/26/2020   Procedure: RIGHT TOTAL KNEE ARTHROPLASTY;  Surgeon: Arnie Lao, MD;  Location: [facility];  Service: Orthopedics;  Laterality: Right;   TOTAL KNEE ARTHROPLASTY Left 09/25/2022   Procedure: LEFT TOTAL KNEE ARTHROPLASTY;  Surgeon: Arnie Lao, MD;  Location: [facility];  Service: Orthopedics;  Laterality: Left;   Social History   Tobacco Use   Smoking status: Never   Smokeless tobacco: Never  Vaping Use   Vaping status: Never Used  Substance Use Topics   Alcohol use: No   Drug use: Never    No family history on file.  Allergies  Allergen Reactions   Cinnamon Flavoring Agent (Non-Screening) Other (See  Comments)    Cinnamon chewing gum caused blisters on tongue    Health Maintenance  Topic Date Due   COVID-19 Vaccine (5 - 2024-25 season) 08/22/2023   FOOT EXAM  06/24/2024   Medicare Annual Wellness (AWV)  07/18/2024   INFLUENZA VACCINE  07/21/2024   HEMOGLOBIN A1C  08/13/2024   OPHTHALMOLOGY EXAM  09/09/2024   Diabetic kidney evaluation - Urine ACR  02/13/2025   Diabetic kidney evaluation - eGFR measurement  03/25/2025   DTaP/Tdap/Td (3 - Td or Tdap) 05/24/2032   Pneumonia Vaccine 64+ Years old  Completed   Hepatitis C Screening  Completed   Zoster Vaccines- Shingrix  Completed   HPV VACCINES  Aged Out   Meningococcal B Vaccine  Aged Out   Colonoscopy  Discontinued   Fecal DNA (Cologuard)  Discontinued    Objective: BP 123/67   Pulse 83   Temp 97.9 F (36.6 C) (Oral)   Wt 187 lb 9.6 oz (85.1 kg)   SpO2 97%   BMI 27.70 kg/m   Physical Exam Constitutional:      Appearance: Normal appearance.  HENT:     Head: Normocephalic and atraumatic.      Mouth: Mucous membranes are moist.  Eyes:    Conjunctiva/sclera: Conjunctivae normal.     Pupils: Pupils are equal, round, and b/l symmetrical   Cardiovascular:     Rate and Rhythm: Normal rate and regular rhythm.     Heart sounds:   Pulmonary:     Effort: Pulmonary effort is normal.     Breath sounds:  Abdominal:     General: Non distended     Palpations:   Skin:    General: Skin is warm and dry.     Comments: site of PICC removal in rt arm bandaged C/D/I, looks OK   MSK -  left knee with steristrips, surgical site looks OK with no signs of infection    Neurological:     General: grossly non focal, ambulatory with a cane    Mental Status: awake, alert and oriented to person, place, and time.   Psychiatric:        Mood and Affect: Mood normal.   Lab Results Lab Results  Component Value Date   WBC 6.4 03/26/2024   HGB 8.6 (L) 03/26/2024   HCT 26.1 (L) 03/26/2024   MCV 85.9 03/26/2024   PLT 374  03/26/2024    Lab Results  Component Value Date   CREATININE 1.00 03/25/2024   BUN 21 03/25/2024   NA 136 03/25/2024   K 3.4 (L) 03/25/2024   CL 101 03/25/2024   CO2 23 03/25/2024    Lab Results  Component Value Date   ALT 15 02/22/2023   AST 14 02/22/2023   ALKPHOS 46 02/22/2023   BILITOT 0.4 02/22/2023    Lab Results  Component Value Date   CHOL 142 02/14/2024   HDL 37.30 (L) 02/14/2024   LDLCALC 57 02/14/2024   LDLDIRECT 81.0 02/22/2023   TRIG 242.0 (H) 02/14/2024   CHOLHDL 4 02/14/2024   No results found for: "LABRPR", "RPRTITER" No results found for: "HIV1RNAQUANT", "HIV1RNAVL", "CD4TABS"   Assessment/Plan 29 Y O male with prior h/o HLD, HTN, BPH/Prostate ca, OA, DM2, B/L Total knee arthroplasty ( rt 04/26/2020 and left 09/25/2022) admitted with    # Left knee PJI  4/4 I&D with exploration and open arthrotomy and synovectomy of left knee, left knee polyliner exchange.  Findings-large cloudy joint effusion with questionable purulent material, Gram stain and culture of the fluid and soft tissues with MSSE - s/p IV cefazolin  and PO rifampin  till today due to accidental displacement of PICC ( original EOT 5/16)  Plan  - will switch to PO cefadroxil 1g po bid for at least 6 months or longer - will continue PO rifampin  until next visit and assess tolerance, will consider stopping next visit - Fu in 6 to 8 weeks - Fu with Ortho  # Medication Monitoring  - 5/5 ESR 43, CRP 7 - 5/12  WBC 4.9, hb 10.1, platelets 343, cr 0.79, AST 11, ALT 3, ALP 67, TB 0.3, esr 44, crp 6  # PICC  - removed  - site OK   I have personally spent 31 minutes involved in face-to-face and non-face-to-face activities for this patient on the day of the visit. Professional time spent includes the following activities: Preparing to see the patient (review of tests), Obtaining and/or reviewing separately obtained history (admission/discharge record), Performing a medically appropriate examination and/or  evaluation , Ordering medications/tests/procedures, referring and communicating with other health care professionals, Documenting clinical information in the EMR, Independently interpreting results (not separately reported), Communicating results to the patient/family/caregiver, Counseling and educating the patient/family/caregiver and Care coordination (not separately reported).   Of note, portions of this note may have been created with voice recognition software. While this note has been edited for accuracy, occasional wrong-word or 'sound-a-like' substitutions may have occurred due to the inherent limitations of voice recognition software.   Melvina Stage, MD [facility] 05/02/2024, 2:56 PM

## 2024-05-04 ENCOUNTER — Encounter: Payer: Self-pay | Admitting: Orthopaedic Surgery

## 2024-05-04 ENCOUNTER — Ambulatory Visit: Admitting: Orthopaedic Surgery

## 2024-05-04 DIAGNOSIS — M65162 Other infective (teno)synovitis, left knee: Secondary | ICD-10-CM | POA: Diagnosis not present

## 2024-05-04 DIAGNOSIS — E119 Type 2 diabetes mellitus without complications: Secondary | ICD-10-CM | POA: Diagnosis not present

## 2024-05-04 DIAGNOSIS — D6859 Other primary thrombophilia: Secondary | ICD-10-CM | POA: Diagnosis not present

## 2024-05-04 DIAGNOSIS — I10 Essential (primary) hypertension: Secondary | ICD-10-CM | POA: Diagnosis not present

## 2024-05-04 DIAGNOSIS — M6281 Muscle weakness (generalized): Secondary | ICD-10-CM | POA: Diagnosis not present

## 2024-05-04 DIAGNOSIS — Z7982 Long term (current) use of aspirin: Secondary | ICD-10-CM | POA: Diagnosis not present

## 2024-05-04 DIAGNOSIS — D630 Anemia in neoplastic disease: Secondary | ICD-10-CM | POA: Diagnosis not present

## 2024-05-04 DIAGNOSIS — Z792 Long term (current) use of antibiotics: Secondary | ICD-10-CM | POA: Diagnosis not present

## 2024-05-04 DIAGNOSIS — Z8611 Personal history of tuberculosis: Secondary | ICD-10-CM | POA: Diagnosis not present

## 2024-05-04 DIAGNOSIS — N138 Other obstructive and reflux uropathy: Secondary | ICD-10-CM | POA: Diagnosis not present

## 2024-05-04 DIAGNOSIS — Z96651 Presence of right artificial knee joint: Secondary | ICD-10-CM | POA: Diagnosis not present

## 2024-05-04 DIAGNOSIS — Z96652 Presence of left artificial knee joint: Secondary | ICD-10-CM

## 2024-05-04 DIAGNOSIS — C61 Malignant neoplasm of prostate: Secondary | ICD-10-CM | POA: Diagnosis not present

## 2024-05-04 DIAGNOSIS — Z9181 History of falling: Secondary | ICD-10-CM | POA: Diagnosis not present

## 2024-05-04 DIAGNOSIS — E782 Mixed hyperlipidemia: Secondary | ICD-10-CM | POA: Diagnosis not present

## 2024-05-04 DIAGNOSIS — Z7984 Long term (current) use of oral hypoglycemic drugs: Secondary | ICD-10-CM | POA: Diagnosis not present

## 2024-05-04 DIAGNOSIS — I4510 Unspecified right bundle-branch block: Secondary | ICD-10-CM | POA: Diagnosis not present

## 2024-05-04 DIAGNOSIS — M159 Polyosteoarthritis, unspecified: Secondary | ICD-10-CM | POA: Diagnosis not present

## 2024-05-04 DIAGNOSIS — B957 Other staphylococcus as the cause of diseases classified elsewhere: Secondary | ICD-10-CM | POA: Diagnosis not present

## 2024-05-04 DIAGNOSIS — Z452 Encounter for adjustment and management of vascular access device: Secondary | ICD-10-CM | POA: Diagnosis not present

## 2024-05-04 DIAGNOSIS — T8454XA Infection and inflammatory reaction due to internal left knee prosthesis, initial encounter: Secondary | ICD-10-CM | POA: Diagnosis not present

## 2024-05-04 DIAGNOSIS — N401 Enlarged prostate with lower urinary tract symptoms: Secondary | ICD-10-CM | POA: Diagnosis not present

## 2024-05-04 DIAGNOSIS — T8459XD Infection and inflammatory reaction due to other internal joint prosthesis, subsequent encounter: Secondary | ICD-10-CM

## 2024-05-04 NOTE — Progress Notes (Signed)
 The patient comes in for follow-up as a relates to an infected left total knee arthroplasty.  He is followed closely by the infectious disease service.  We performed an I&D and a poly liner exchange about 6 weeks ago.  His PICC line did come out yesterday.  They have switched his antibiotics.  He says he is doing okay overall.  On exam his left knee has swelling to be expected but no redness.  The incision is healed nicely.  His calf is soft.  He will continue to work on range of motion of his knee.  I would like to see him back in 6 weeks to see how he is doing overall.  No x-rays are needed.

## 2024-05-08 DIAGNOSIS — N138 Other obstructive and reflux uropathy: Secondary | ICD-10-CM | POA: Diagnosis not present

## 2024-05-08 DIAGNOSIS — E119 Type 2 diabetes mellitus without complications: Secondary | ICD-10-CM | POA: Diagnosis not present

## 2024-05-08 DIAGNOSIS — Z9181 History of falling: Secondary | ICD-10-CM | POA: Diagnosis not present

## 2024-05-08 DIAGNOSIS — T8454XA Infection and inflammatory reaction due to internal left knee prosthesis, initial encounter: Secondary | ICD-10-CM | POA: Diagnosis not present

## 2024-05-08 DIAGNOSIS — C61 Malignant neoplasm of prostate: Secondary | ICD-10-CM | POA: Diagnosis not present

## 2024-05-08 DIAGNOSIS — I10 Essential (primary) hypertension: Secondary | ICD-10-CM | POA: Diagnosis not present

## 2024-05-08 DIAGNOSIS — M159 Polyosteoarthritis, unspecified: Secondary | ICD-10-CM | POA: Diagnosis not present

## 2024-05-08 DIAGNOSIS — N401 Enlarged prostate with lower urinary tract symptoms: Secondary | ICD-10-CM | POA: Diagnosis not present

## 2024-05-08 DIAGNOSIS — D6859 Other primary thrombophilia: Secondary | ICD-10-CM | POA: Diagnosis not present

## 2024-05-08 DIAGNOSIS — Z7982 Long term (current) use of aspirin: Secondary | ICD-10-CM | POA: Diagnosis not present

## 2024-05-08 DIAGNOSIS — B957 Other staphylococcus as the cause of diseases classified elsewhere: Secondary | ICD-10-CM | POA: Diagnosis not present

## 2024-05-08 DIAGNOSIS — I4510 Unspecified right bundle-branch block: Secondary | ICD-10-CM | POA: Diagnosis not present

## 2024-05-08 DIAGNOSIS — Z96651 Presence of right artificial knee joint: Secondary | ICD-10-CM | POA: Diagnosis not present

## 2024-05-08 DIAGNOSIS — Z792 Long term (current) use of antibiotics: Secondary | ICD-10-CM | POA: Diagnosis not present

## 2024-05-08 DIAGNOSIS — Z7984 Long term (current) use of oral hypoglycemic drugs: Secondary | ICD-10-CM | POA: Diagnosis not present

## 2024-05-08 DIAGNOSIS — Z452 Encounter for adjustment and management of vascular access device: Secondary | ICD-10-CM | POA: Diagnosis not present

## 2024-05-08 DIAGNOSIS — D630 Anemia in neoplastic disease: Secondary | ICD-10-CM | POA: Diagnosis not present

## 2024-05-08 DIAGNOSIS — M6281 Muscle weakness (generalized): Secondary | ICD-10-CM | POA: Diagnosis not present

## 2024-05-08 DIAGNOSIS — Z8611 Personal history of tuberculosis: Secondary | ICD-10-CM | POA: Diagnosis not present

## 2024-05-08 DIAGNOSIS — M65162 Other infective (teno)synovitis, left knee: Secondary | ICD-10-CM | POA: Diagnosis not present

## 2024-05-08 DIAGNOSIS — E782 Mixed hyperlipidemia: Secondary | ICD-10-CM | POA: Diagnosis not present

## 2024-05-10 ENCOUNTER — Encounter: Payer: Self-pay | Admitting: Urology

## 2024-05-10 ENCOUNTER — Ambulatory Visit: Payer: Medicare Other | Admitting: Urology

## 2024-05-10 VITALS — BP 145/63 | HR 79 | Ht 68.0 in | Wt 180.0 lb

## 2024-05-10 DIAGNOSIS — C61 Malignant neoplasm of prostate: Secondary | ICD-10-CM

## 2024-05-10 DIAGNOSIS — Z87440 Personal history of urinary (tract) infections: Secondary | ICD-10-CM

## 2024-05-10 DIAGNOSIS — N138 Other obstructive and reflux uropathy: Secondary | ICD-10-CM

## 2024-05-10 DIAGNOSIS — N401 Enlarged prostate with lower urinary tract symptoms: Secondary | ICD-10-CM

## 2024-05-10 DIAGNOSIS — Z79818 Long term (current) use of other agents affecting estrogen receptors and estrogen levels: Secondary | ICD-10-CM

## 2024-05-10 LAB — URINALYSIS, ROUTINE W REFLEX MICROSCOPIC
Bilirubin, UA: NEGATIVE
Glucose, UA: NEGATIVE
Ketones, UA: NEGATIVE
Leukocytes,UA: NEGATIVE
Nitrite, UA: NEGATIVE
Protein,UA: NEGATIVE
RBC, UA: NEGATIVE
Specific Gravity, UA: 1.015 (ref 1.005–1.030)
Urobilinogen, Ur: 0.2 mg/dL (ref 0.2–1.0)
pH, UA: 7 (ref 5.0–7.5)

## 2024-05-10 LAB — BLADDER SCAN AMB NON-IMAGING

## 2024-05-10 MED ORDER — GEMTESA 75 MG PO TABS: 75.0000 mg | ORAL_TABLET | Freq: Every day | ORAL | 0 refills | Status: DC

## 2024-05-10 MED ORDER — LEUPROLIDE ACETATE (6 MONTH) 45 MG ~~LOC~~ KIT
45.0000 mg | PACK | Freq: Once | SUBCUTANEOUS | Status: AC
  Administered 2024-05-10: 45 mg via SUBCUTANEOUS

## 2024-05-10 MED ORDER — ALFUZOSIN HCL ER 10 MG PO TB24: 10.0000 mg | ORAL_TABLET | Freq: Every day | ORAL | 11 refills | Status: DC

## 2024-05-10 NOTE — Progress Notes (Signed)
 Eligard  SubQ Injection   Due to Prostate Cancer patient is present today for a Eligard  Injection.  Medication: Eligard  6 month Dose: 45 mg  Location: right upper outer buttocks Lot: [id number] Exp: 08/20/2025  Patient tolerated well, no complications were noted  Performed by: Everlina Hock CMA  PA approval dates:  11/04/23 through 11/24/24. Authorization # [id number].

## 2024-05-10 NOTE — Progress Notes (Signed)
 Assessment: 1. 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 Encounter for monitoring androgen deprivation therapy   3. BPH with obstruction/lower urinary tract symptoms   4. History of UTI     Plan: Trial of alfuzosin 10 mg daily in place of tamsulosin  Trial of Gemtesa 75 mg daily.  Samples given. D/C solifenacin  Continue LT ADT with 6 month Eligard  given today - next dose due after 11/06/24.  Plan for 18-24 months of ADT (started 10/24) Continue daily calcium  and vit D supplement while on ADT to reduce risk of osteoporosis PSA today Return to office in 1 month  Chief Complaint:  Chief Complaint  Patient presents with   Prostate Cancer    History of Present Illness:  Richard Harding is a 78 y.o. male who is seen for further evaluation of high risk prostate cancer, BPH with obstruction, and history of  UTI. PSA results: 9/08 3.52 10/13 8.08 1/14 6.54 3/17 9.05 4/17 9.15 3/24 28.45  He has previously been evaluated for an elevated PSA by Dr. Theotis Flake in 2008 and again by Dr. Derrick Fling in November 2013. No prior prostate biopsy.  No family history of prostate cancer. He reported lower urinary tract symptoms including frequency, occasional urgency, and nocturia x 2.  He was not having any incontinence.  No dysuria or gross hematuria. IPSS = 16. PVR = 181 ml.  He also reports a history of erectile dysfunction and decreased libido.  He has previously used Viagra and Cialis with adequate results.  He is inquiring about testosterone  replacement therapy. Testosterone  level from 7/23 was 271.  Urine culture from 3/24 grew >100 K staph epidermidis.  He was treated with Bactrim  for 7 days. He was also started on tamsulosin  0.4 mg daily for his BPH with obstructive symptoms.  At his visit in April 2024, he had completed his antibiotics.  He continued on tamsulosin .  He reported improvement in his urinary symptoms with the tamsulosin  with  decreased frequency, urgency, and nocturia.  He felt like he was emptying his bladder more completely.  No dysuria or gross hematuria. IPSS = 10.  His PSA in June 2024 had increased to 30.7.  He underwent evaluation with a prostate MRI which showed a PI-RADS 5 lesion in the left anterior transition zone and a PI-RADS 3 lesion in the left posterior lateral peripheral zone.  Prostate volume measured 88.56 cm.  He underwent a fusion biopsy on 09/02/2023.  Pathology showed Gleason 4+5 = 9 in 40% of 1 of 2 cores from the left region of interest.  Repeat PSA prior to his biopsy had increased to 97.2. PSMA PET scan obtained on 09/27/2023 showed intense radiotracer activity in the left lobe of the prostate and no evidence of metastatic disease. He was started on androgen deprivation therapy for management of his very high risk prostate cancer with Firmagon  on 10/06/23. He reported some lower abdominal pain.  He continued on tamsulosin .  His lower urinary tract symptoms were stable.  No dysuria or gross hematuria. IPSS = 11.  Urine culture from 10/06/2023 grew >100 K E. coli.  He was treated with cefdinir  x 7 days. He has seen Dr. Lorri Rota with radiation oncology and has elected to proceed with external beam radiation for his prostate cancer. He received a 6 month Eligard  injection on 11/14/23 for continuation of his long-term ADT. He has tolerated ADT well.  He does have occasional hot flashes. He continued on tamsulosin  for  his urinary symptoms.  He continued with symptoms of frequency, urgency, and incomplete emptying. IPSS = 22. Urine culture from 11/24: 10-25K E. coli.  Treated with cefdinir  x 7 days and started on daily Macrodantin .  He underwent placement of fiducial markers and SpaceOAR on 11/23/2023. He completed radiation therapy on 02/04/2024.  He noted some increased lower urinary tract symptoms associated with radiation therapy.  He reports increased frequency, urgency, and nocturia.  He was  recently started on Solifenacin .  No dysuria or gross hematuria.  He continued on tamsulosin  and daily Macrodantin .  He returns today for follow-up.  He continues to have lower urinary tract symptoms with frequency, urgency, and nocturia.  He continues on tamsulosin  and solifenacin  5 mg.  No dysuria or gross hematuria. IPSS = 22/4. He also presents today for his 21-month Eligard  injection.  He reports decreased energy. No recent UTI symptoms.  He is on antibiotics for treatment of a infection of his total knee replacement.  Portions of the above documentation were copied from a prior visit for review purposes only.   Past Medical History:  Past Medical History:  Diagnosis Date   BPH with obstruction/lower urinary tract symptoms    Hyperlipidemia, mixed    Hypertension    Limited active range of motion (AROM) of cervical spine on rotation    Malignant neoplasm prostate [facility]) 08/2023   urologist-- dr Jentri Aye/  radiation oncologist-- dr Lorri Rota;  dx 09/ 2024, gleason 4+5, psa 97.2,  vol 88.5   OA (osteoarthritis)    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   Type 2 diabetes mellitus (HCC)    followed by pcp   (11-10-2023  pt stated does not check blood sugar)   Wears glasses     Past Surgical History:  Past Surgical History:  Procedure Laterality Date   ANTERIOR FUSION CERVICAL SPINE  1990   Thinks it was C-3-4  done here at [facility]   BILATERAL CARPAL TUNNEL RELEASE Bilateral    1990s   COLONOSCOPY  2011   EXCISIONAL TOTAL KNEE ARTHROPLASTY WITH ANTIBIOTIC SPACERS Left 03/24/2024   Procedure: LEFT KNEE SYNOVECTOMY, IRRIGATION AND DEBRIDEMENT WITH POLY EXCHANGE;  Surgeon: Arnie Lao, MD;  Location: [facility];  Service: Orthopedics;  Laterality: Left;   GOLD SEED IMPLANT N/A 11/23/2023   Procedure: GOLD SEED IMPLANT;  Surgeon: Mellie Sprinkle., MD;  Location: [facility];  Service: Urology;  Laterality: N/A;    SPACE OAR INSTILLATION N/A 11/23/2023   Procedure: SPACE OAR INSTILLATION;  Surgeon: Mellie Sprinkle., MD;  Location[facility];  Service: Urology;  Laterality: N/A;   TOTAL KNEE ARTHROPLASTY Right 04/26/2020   Procedure: RIGHT TOTAL KNEE ARTHROPLASTY;  Surgeon: Arnie Lao, MD;  Location: [facility];  Service: Orthopedics;  Laterality: Right;   TOTAL KNEE ARTHROPLASTY Left 09/25/2022   Procedure: LEFT TOTAL KNEE ARTHROPLASTY;  Surgeon: Arnie Lao, MD;  Location: [facility];  Service: Orthopedics;  Laterality: Left;    Allergies:  Allergies  Allergen Reactions   Cinnamon Flavoring Agent (Non-Screening) Other (See Comments)    Cinnamon chewing gum caused blisters on tongue    Family History:  No family history on file.  Social History:  Social History   Tobacco Use   Smoking status: Never   Smokeless tobacco: Never  Vaping Use   Vaping status: Never Used  Substance Use Topics   Alcohol use: No  Drug use: Never    ROS: Constitutional:  Negative for fever, chills, weight loss CV: Negative for chest pain, previous MI, hypertension Respiratory:  Negative for shortness of breath, wheezing, sleep apnea, frequent cough GI:  Negative for nausea, vomiting, bloody stool, GERD  Physical exam: BP (!) 145/63   Pulse 79   Ht 5\' 8"  (1.727 m)   Wt 180 lb (81.6 kg)   BMI 27.37 kg/m  GENERAL APPEARANCE:  Well appearing, well developed, well nourished, NAD HEENT:  Atraumatic, normocephalic, oropharynx clear NECK:  Supple without lymphadenopathy or thyromegaly ABDOMEN:  Soft, non-tender, no masses EXTREMITIES:  Moves all extremities well, without clubbing, cyanosis, or edema NEUROLOGIC:  Alert and oriented x 3, normal gait, CN II-XII grossly intact MENTAL STATUS:  appropriate BACK:  Non-tender to palpation, No CVAT SKIN:  Warm, dry, and intact  Results: U/A: Negative  PVR = 174 ml

## 2024-05-11 ENCOUNTER — Ambulatory Visit: Payer: Self-pay | Admitting: Urology

## 2024-05-11 LAB — PSA: Prostate Specific Ag, Serum: <0.1 ng/mL (ref 0.0–4.0)

## 2024-05-17 ENCOUNTER — Ambulatory Visit: Payer: Self-pay | Admitting: Nurse Practitioner

## 2024-05-17 ENCOUNTER — Ambulatory Visit (INDEPENDENT_AMBULATORY_CARE_PROVIDER_SITE_OTHER): Payer: Medicare Other | Admitting: Nurse Practitioner

## 2024-05-17 ENCOUNTER — Encounter: Payer: Self-pay | Admitting: Nurse Practitioner

## 2024-05-17 VITALS — BP 118/56 | HR 106 | Temp 97.4°F | Ht 68.0 in | Wt 190.0 lb

## 2024-05-17 DIAGNOSIS — T8450XD Infection and inflammatory reaction due to unspecified internal joint prosthesis, subsequent encounter: Secondary | ICD-10-CM

## 2024-05-17 DIAGNOSIS — Z7984 Long term (current) use of oral hypoglycemic drugs: Secondary | ICD-10-CM | POA: Diagnosis not present

## 2024-05-17 DIAGNOSIS — R531 Weakness: Secondary | ICD-10-CM | POA: Diagnosis not present

## 2024-05-17 DIAGNOSIS — E1151 Type 2 diabetes mellitus with diabetic peripheral angiopathy without gangrene: Secondary | ICD-10-CM

## 2024-05-17 DIAGNOSIS — C61 Malignant neoplasm of prostate: Secondary | ICD-10-CM

## 2024-05-17 DIAGNOSIS — E785 Hyperlipidemia, unspecified: Secondary | ICD-10-CM

## 2024-05-17 DIAGNOSIS — I10 Essential (primary) hypertension: Secondary | ICD-10-CM | POA: Diagnosis not present

## 2024-05-17 HISTORY — DX: Weakness: R53.1

## 2024-05-17 LAB — LIPID PANEL
Cholesterol: 168 mg/dL (ref 0–200)
HDL: 34.3 mg/dL — ABNORMAL LOW (ref >39.00)
LDL Cholesterol: 72 mg/dL (ref 0–99)
NonHDL: 133.53
Total CHOL/HDL Ratio: 5
Triglycerides: 310 mg/dL — ABNORMAL HIGH (ref 0.0–149.0)
VLDL: 62 mg/dL — ABNORMAL HIGH (ref 0.0–40.0)

## 2024-05-17 LAB — COMPREHENSIVE METABOLIC PANEL WITH GFR
ALT: 8 U/L (ref 0–53)
AST: 10 U/L (ref 0–37)
Albumin: 4.1 g/dL (ref 3.5–5.2)
Alkaline Phosphatase: 52 U/L (ref 39–117)
BUN: 16 mg/dL (ref 6–23)
CO2: 32 meq/L (ref 19–32)
Calcium: 9.6 mg/dL (ref 8.4–10.5)
Chloride: 97 meq/L (ref 96–112)
Creatinine, Ser: 1 mg/dL (ref 0.40–1.50)
GFR: 72.89 mL/min (ref >60.00)
Glucose, Bld: 179 mg/dL — ABNORMAL HIGH (ref 70–99)
Potassium: 3.2 meq/L — ABNORMAL LOW (ref 3.5–5.1)
Sodium: 139 meq/L (ref 135–145)
Total Bilirubin: 0.3 mg/dL (ref 0.2–1.2)
Total Protein: 6.9 g/dL (ref 6.0–8.3)

## 2024-05-17 LAB — CBC WITH DIFFERENTIAL/PLATELET
Basophils Absolute: 0 10*3/uL (ref 0.0–0.1)
Basophils Relative: 0.5 % (ref 0.0–3.0)
Eosinophils Absolute: 0.2 10*3/uL (ref 0.0–0.7)
Eosinophils Relative: 3.1 % (ref 0.0–5.0)
HCT: 31 % — ABNORMAL LOW (ref 39.0–52.0)
Hemoglobin: 10.8 g/dL — ABNORMAL LOW (ref 13.0–17.0)
Lymphocytes Relative: 16.3 % (ref 12.0–46.0)
Lymphs Abs: 0.9 10*3/uL (ref 0.7–4.0)
MCHC: 34.9 g/dL (ref 30.0–36.0)
MCV: 80.3 fl (ref 78.0–100.0)
Monocytes Absolute: 0.4 10*3/uL (ref 0.1–1.0)
Monocytes Relative: 8 % (ref 3.0–12.0)
Neutro Abs: 3.8 10*3/uL (ref 1.4–7.7)
Neutrophils Relative %: 72.1 % (ref 43.0–77.0)
Platelets: 327 10*3/uL (ref 150.0–400.0)
RBC: 3.86 Mil/uL — ABNORMAL LOW (ref 4.22–5.81)
RDW: 14.5 % (ref 11.5–15.5)
WBC: 5.3 10*3/uL (ref 4.0–10.5)

## 2024-05-17 LAB — VITAMIN B12: Vitamin B-12: 321 pg/mL (ref 211–911)

## 2024-05-17 LAB — HEMOGLOBIN A1C: Hgb A1c MFr Bld: 6.6 % — ABNORMAL HIGH (ref 4.6–6.5)

## 2024-05-17 LAB — TSH: TSH: 4.93 u[IU]/mL (ref 0.35–5.50)

## 2024-05-17 LAB — POCT GLUCOSE (DEVICE FOR HOME USE)
Glucose Fasting, POC: 205 mg/dL — AB (ref 70–99)
POC Glucose: 205 mg/dL — AB (ref 70–99)

## 2024-05-17 LAB — PSA: PSA: 0.07 ng/mL — ABNORMAL LOW (ref 0.10–4.00)

## 2024-05-17 LAB — VITAMIN D 25 HYDROXY (VIT D DEFICIENCY, FRACTURES): VITD: 21.24 ng/mL — ABNORMAL LOW (ref 30.00–100.00)

## 2024-05-17 MED ORDER — POTASSIUM CHLORIDE CRYS ER 20 MEQ PO TBCR: 20.0000 meq | EXTENDED_RELEASE_TABLET | Freq: Every day | ORAL | 1 refills | Status: DC

## 2024-05-17 MED ORDER — FENOFIBRATE 145 MG PO TABS: 145.0000 mg | ORAL_TABLET | Freq: Every day | ORAL | 1 refills | Status: DC

## 2024-05-17 MED ORDER — FREESTYLE LIBRE 3 SENSOR MISC: 1.0000 | 3 refills | Status: DC

## 2024-05-17 NOTE — Patient Instructions (Signed)
It was great to see you!  We are checking your labs today and will let you know the results via mychart/phone.   Let's follow-up in 3 months, sooner if you have concerns.  If a referral was placed today, you will be contacted for an appointment. Please note that routine referrals can sometimes take up to 3-4 weeks to process. Please call our office if you haven't heard anything after this time frame.  Take care,  Nanako Stopher, NP  

## 2024-05-17 NOTE — Assessment & Plan Note (Signed)
 He experiences persistent weakness and fatigue, likely due to prostate cancer treatment, recent surgeries, and antibiotics. He denies recent falls and recently finished PT. Thyroid  function will be checked, and vitamin levels (B12 and D) evaluated.

## 2024-05-17 NOTE — Assessment & Plan Note (Signed)
 Chronic, stable. Continue atorvastatin  10mg  daily, fenofibrate  54mg  daily, and lovaza  1g 4 times a day. Refill sent to the pharmacy. Check CMP, CBC, lipid panel today.

## 2024-05-17 NOTE — Assessment & Plan Note (Signed)
 He underwent a third knee replacement due to suspected infection, with a high white cell count in fluid analysis. He is on oral antibiotics (cefadroxil  1,000mg  BID and rifampin  300mg  BID) following PICC line removal. The knee is tender but improving. Continue current oral antibiotics, follow up with an infectious disease specialist, and monitor the knee for signs of infection or fluid accumulation. Encourage range of motion exercises. Notes reviewed from infectious disease and orthopedics.

## 2024-05-17 NOTE — Assessment & Plan Note (Signed)
 Chronic, stable. Diabetes is managed with metformin  XR 500mg  BID. He is interested in a continuous glucose monitor if his insurance covers it. Freestyle libre ordered today. No neuropathy symptoms are present. Check CMP, CBC, A1c, lipid panel today. Follow-up in 3 months.

## 2024-05-17 NOTE — Assessment & Plan Note (Signed)
 Prostate cancer has been treated with radiation, and he is on hormone therapy every six months, which causes pain, weakness, and hot flashes. He experiences feverish sensations possibly related to the injections. Continue collaboration and recommendations from urology and oncology. He would like his PSA levels checked today.

## 2024-05-17 NOTE — Progress Notes (Signed)
 Established Patient Office Visit  Subjective   Patient ID: Richard Harding, male    DOB: Jun 29, 1946  Age: 78 y.o. MRN: [id number]  Chief Complaint  Patient presents with   Diabetes    Follow up and discuss medications, request a Rx of Potassium and Glucose Monitor    HPI Discussed the use of AI scribe software for clinical note transcription with the patient, who gave verbal consent to proceed.  History of Present Illness   Richard Harding "Richard Harding" is a 78 year old male with prostate cancer and recent knee replacement with infection who presents with weakness and medication management.  He experiences significant weakness following radiation therapy for prostate cancer and knee replacement surgery. He feels very tired after activities such as grocery shopping and often needs to nap afterward. He denies chest pain, shortness of breath, diarrhea, or numbness or tingling in his feet. He occasionally feels feverish and has hot flashes. He has not had any recent falls but experienced dizziness once when standing up quickly.  He has completed forty sessions of radiation therapy for prostate cancer and receives hormone injections every six months, which cause soreness at the injection site and are associated with hot flashes and feverish sensations.  He underwent a total knee replacement for the third time due to fluid accumulation. He is on heavy antibiotics, taking six capsules a day, and previously had a PICC line for IV antibiotics, which was removed after it accidentally came out during sleep. His knee is tender and larger compared to the other knee but is less painful than after previous surgeries. He discontinued oxycodone  earlier than expected due to concerns about addiction.  He takes metformin  for blood sugar management and has previously been prescribed potassium supplements. He is unsure about other medications due to changes made by his urologist and has not been checking his  blood sugar levels recently. He attributes his weakness to the antibiotics and other medications.       ROS See pertinent positives and negatives per HPI.    Objective:     BP (!) 118/56 (BP Location: Left Arm, Patient Position: Sitting, Cuff Size: Normal)   Pulse (!) 106   Temp (!) 97.4 F (36.3 C)   Ht 5\' 8"  (1.727 m)   Wt 190 lb (86.2 kg)   SpO2 99%   BMI 28.89 kg/m    Physical Exam Vitals and nursing note reviewed.  Constitutional:      Appearance: Normal appearance.  HENT:     Head: Normocephalic.  Eyes:     Conjunctiva/sclera: Conjunctivae normal.  Cardiovascular:     Rate and Rhythm: Normal rate and regular rhythm.     Pulses: Normal pulses.     Heart sounds: Normal heart sounds.  Pulmonary:     Effort: Pulmonary effort is normal.     Breath sounds: Normal breath sounds.  Musculoskeletal:        General: Swelling (slight in left knee) and tenderness (slight in left knee) present.     Cervical back: Normal range of motion.  Skin:    General: Skin is warm.  Neurological:     General: No focal deficit present.     Mental Status: He is alert and oriented to person, place, and time.  Psychiatric:        Mood and Affect: Mood normal.        Behavior: Behavior normal.        Thought Content: Thought content normal.  Judgment: Judgment normal.    Diabetic Foot Exam - Simple   Simple Foot Form Visual Inspection No deformities, no ulcerations, no other skin breakdown bilaterally: Yes Sensation Testing Intact to touch and monofilament testing bilaterally: Yes Pulse Check Posterior Tibialis and Dorsalis pulse intact bilaterally: Yes Comments     The 10-year ASCVD risk score (Arnett DK, et al., 2019) is: 44.5%    Assessment & Plan:   Problem List Items Addressed This Visit       Cardiovascular and Mediastinum   Essential hypertension   Chronic, stable. Continue chlorthalidone  25mg  daily and metoprolol  100mg  BID. Check CMP, CBC today.        Relevant Orders   CBC with Differential/Platelet   Comprehensive metabolic panel with GFR   Lipid panel   TSH   VITAMIN D 25 Hydroxy (Vit-D Deficiency, Fractures)   Vitamin B12   DM (diabetes mellitus) type II, controlled, with peripheral vascular disorder (HCC) - Primary   Chronic, stable. Diabetes is managed with metformin  XR 500mg  BID. He is interested in a continuous glucose monitor if his insurance covers it. Freestyle libre ordered today. No neuropathy symptoms are present. Check CMP, CBC, A1c, lipid panel today. Follow-up in 3 months.       Relevant Medications   Continuous Glucose Sensor (FREESTYLE LIBRE 3 SENSOR) MISC   Other Relevant Orders   CBC with Differential/Platelet   Comprehensive metabolic panel with GFR   Lipid panel   Hemoglobin A1c   VITAMIN D 25 Hydroxy (Vit-D Deficiency, Fractures)   Vitamin B12   POCT Glucose (Device for Home Use) (Completed)     Musculoskeletal and Integument   Prosthetic joint infection, subsequent encounter   He underwent a third knee replacement due to suspected infection, with a high white cell count in fluid analysis. He is on oral antibiotics (cefadroxil  1,000mg  BID and rifampin  300mg  BID) following PICC line removal. The knee is tender but improving. Continue current oral antibiotics, follow up with an infectious disease specialist, and monitor the knee for signs of infection or fluid accumulation. Encourage range of motion exercises. Notes reviewed from infectious disease and orthopedics.       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 He experiences feverish sensations possibly related to the injections. Continue collaboration and recommendations from urology and oncology. He would like his PSA levels checked  today.       Relevant Orders   PSA     Other   Hyperlipidemia LDL goal <70   Chronic, stable. Continue atorvastatin  10mg  daily, fenofibrate  54mg  daily, and lovaza  1g 4 times a day. Refill sent to the pharmacy. Check CMP, CBC, lipid panel today.       Weakness   He experiences persistent weakness and fatigue, likely due to prostate cancer treatment, recent surgeries, and antibiotics. He denies recent falls and recently finished PT. Thyroid  function will be checked, and vitamin levels (B12 and D) evaluated.      Relevant Orders   TSH   VITAMIN D 25 Hydroxy (Vit-D Deficiency, Fractures)   Vitamin B12     Return in about 3 months (around 08/17/2024) for Diabetes.    Odette Benjamin, NP

## 2024-05-17 NOTE — Assessment & Plan Note (Signed)
 Chronic, stable. Continue chlorthalidone  25mg  daily and metoprolol  100mg  BID. Check CMP, CBC today.

## 2024-05-18 ENCOUNTER — Encounter: Payer: Self-pay | Admitting: *Deleted

## 2024-06-05 ENCOUNTER — Encounter: Payer: Self-pay | Admitting: *Deleted

## 2024-06-05 ENCOUNTER — Inpatient Hospital Stay: Attending: Adult Health | Admitting: *Deleted

## 2024-06-05 DIAGNOSIS — C61 Malignant neoplasm of prostate: Secondary | ICD-10-CM

## 2024-06-05 NOTE — Progress Notes (Signed)
 SCP reviewed and completed.Allergies and meds reviewed and updated. Pt denies smoking nor drinking. Pt says he still has hotflashes and  intermittent headaches due to the hormone therapy. The headaches are sometimes relieved with Aleve . Most recent PSA was 0.07 on 05/17/2024. Pt says when he feel fatigued , he just takes a nap and feels better. Pt states that he enjoys having lunch once a month with his former co-workers. Nocturia x 3 nightly. Daytime frequency is much less. Pt will see Dr. Willye Harvey near end of June. A copy of prostate cancer tx summary will be sent to PCP.

## 2024-06-14 ENCOUNTER — Ambulatory Visit: Admitting: Urology

## 2024-06-14 ENCOUNTER — Encounter: Payer: Self-pay | Admitting: Urology

## 2024-06-14 VITALS — BP 159/63 | HR 70 | Ht 69.0 in | Wt 190.0 lb

## 2024-06-14 DIAGNOSIS — N138 Other obstructive and reflux uropathy: Secondary | ICD-10-CM | POA: Diagnosis not present

## 2024-06-14 DIAGNOSIS — N401 Enlarged prostate with lower urinary tract symptoms: Secondary | ICD-10-CM

## 2024-06-14 DIAGNOSIS — Z87440 Personal history of urinary (tract) infections: Secondary | ICD-10-CM

## 2024-06-14 DIAGNOSIS — Z79818 Long term (current) use of other agents affecting estrogen receptors and estrogen levels: Secondary | ICD-10-CM

## 2024-06-14 DIAGNOSIS — C61 Malignant neoplasm of prostate: Secondary | ICD-10-CM | POA: Diagnosis not present

## 2024-06-14 LAB — MICROSCOPIC EXAMINATION

## 2024-06-14 LAB — URINALYSIS, ROUTINE W REFLEX MICROSCOPIC
Bilirubin, UA: NEGATIVE
Glucose, UA: NEGATIVE
Leukocytes,UA: NEGATIVE
Nitrite, UA: NEGATIVE
RBC, UA: NEGATIVE
Specific Gravity, UA: 1.02 (ref 1.005–1.030)
Urobilinogen, Ur: 0.2 mg/dL (ref 0.2–1.0)
pH, UA: 6.5 (ref 5.0–7.5)

## 2024-06-14 MED ORDER — MIRABEGRON ER 50 MG PO TB24: 50.0000 mg | ORAL_TABLET | Freq: Every day | ORAL | 11 refills | Status: DC

## 2024-06-14 NOTE — Progress Notes (Signed)
 Assessment: 1. 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 Encounter for monitoring androgen deprivation therapy   3. BPH with obstruction/lower urinary tract symptoms   4. History of UTI     Plan: Continue alfuzosin  10 mg daily Trial of Myrbetriq 50 mg daily.  Rx sent. Continue LT ADT with 6 month Eligard  - next dose due after 11/06/24.  Plan for 18-24 months of ADT (started 10/24) Continue daily calcium  and vit D supplement while on ADT to reduce risk of osteoporosis Return to office after 11/06/24 for next Eligard  injection and PSA  Chief Complaint:  Chief Complaint  Patient presents with   Prostate Cancer    History of Present Illness:  Richard Harding is a 78 y.o. male who is seen for further evaluation of high risk prostate cancer, BPH with obstruction, and history of  UTI. PSA results: 9/08 3.52 10/13 8.08 1/14 6.54 3/17 9.05 4/17 9.15 3/24 28.45  He has previously been evaluated for an elevated PSA by Dr. Amiel in 2008 and again by Dr. Nieves in November 2013. No prior prostate biopsy.  No family history of prostate cancer. He reported lower urinary tract symptoms including frequency, occasional urgency, and nocturia x 2.  He was not having any incontinence.  No dysuria or gross hematuria. IPSS = 16. PVR = 181 ml.  He also reports a history of erectile dysfunction and decreased libido.  He has previously used Viagra and Cialis with adequate results.  He is inquiring about testosterone  replacement therapy. Testosterone  level from 7/23 was 271.  Urine culture from 3/24 grew >100 K staph epidermidis.  He was treated with Bactrim  for 7 days. He was also started on tamsulosin  0.4 mg daily for his BPH with obstructive symptoms.  At his visit in April 2024, he had completed his antibiotics.  He continued on tamsulosin .  He reported improvement in his urinary symptoms with the tamsulosin  with decreased frequency, urgency, and  nocturia.  He felt like he was emptying his bladder more completely.  No dysuria or gross hematuria. IPSS = 10.  His PSA in June 2024 had increased to 30.7.  He underwent evaluation with a prostate MRI which showed a PI-RADS 5 lesion in the left anterior transition zone and a PI-RADS 3 lesion in the left posterior lateral peripheral zone.  Prostate volume measured 88.56 cm.  He underwent a fusion biopsy on 09/02/2023.  Pathology showed Gleason 4+5 = 9 in 40% of 1 of 2 cores from the left region of interest.  Repeat PSA prior to his biopsy had increased to 97.2. PSMA PET scan obtained on 09/27/2023 showed intense radiotracer activity in the left lobe of the prostate and no evidence of metastatic disease. He was started on androgen deprivation therapy for management of his very high risk prostate cancer with Firmagon  on 10/06/23. He reported some lower abdominal pain.  He continued on tamsulosin .  His lower urinary tract symptoms were stable.  No dysuria or gross hematuria. IPSS = 11.  Urine culture from 10/06/2023 grew >100 K E. coli.  He was treated with cefdinir  x 7 days. He has seen Dr. Patrcia with radiation oncology and has elected to proceed with external beam radiation for his prostate cancer. He received a 6 month Eligard  injection on 11/14/23 for continuation of his long-term ADT. He has tolerated ADT well.  He does have occasional hot flashes. He continued on tamsulosin  for his urinary symptoms.  He continued  with symptoms of frequency, urgency, and incomplete emptying. IPSS = 22. Urine culture from 11/24: 10-25K E. coli.  Treated with cefdinir  x 7 days and started on daily Macrodantin .  He underwent placement of fiducial markers and SpaceOAR on 11/23/2023. He completed radiation therapy on 02/04/2024. He received a 3-month Eligard  injection on 05/10/2024.  Post treatment PSA: 5/25 <0.1  He noted some increased lower urinary tract symptoms associated with radiation therapy.  He reports  increased frequency, urgency, and nocturia.  He was started on Solifenacin .  No dysuria or gross hematuria.  He continued on tamsulosin  and daily Macrodantin .  At his visit in May 2025, he continued to have lower urinary tract symptoms with frequency, urgency, and nocturia.  He continued on tamsulosin  and solifenacin  5 mg.  No dysuria or gross hematuria. IPSS = 22/4. He reported decreased energy and hot flashes. No recent UTI symptoms.  He is on antibiotics for treatment of a infection of his total knee replacement. He was changed to alfuzosin  and given a trial of Gemtesa .  He was in today for follow-up.  He continues on alfuzosin .  He noted improvement with the Gemtesa  but ran out of samples.  He also is unable to afford the medication. He continues to have some frequency and nocturia.  No dysuria or gross hematuria. IPSS = 14/2.  Portions of the above documentation were copied from a prior visit for review purposes only.   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   Type 2 diabetes mellitus (HCC)    followed by pcp   (11-10-2023  pt stated does not check blood sugar)   Wears glasses     Past Surgical History:  Past Surgical History:  Procedure Laterality Date   ANTERIOR FUSION CERVICAL SPINE  1990   Thinks it was C-3-4  done here at [facility]   BILATERAL CARPAL TUNNEL RELEASE Bilateral    1990s   COLONOSCOPY  2011   EXCISIONAL TOTAL KNEE ARTHROPLASTY WITH ANTIBIOTIC SPACERS Left 03/24/2024   Procedure: LEFT KNEE SYNOVECTOMY, IRRIGATION AND  DEBRIDEMENT WITH POLY EXCHANGE;  Surgeon: Vernetta Lonni GRADE, MD;  Location: [facility];  Service: Orthopedics;  Laterality: Left;   GOLD SEED IMPLANT N/A 11/23/2023   Procedure: GOLD SEED IMPLANT;  Surgeon: Roseann Adine PARAS., MD;  Location: [facility];  Service: Urology;  Laterality: N/A;   SPACE OAR INSTILLATION N/A 11/23/2023   Procedure: SPACE OAR INSTILLATION;  Surgeon: Roseann Adine PARAS., MD;  Location: [facility];  Service: Urology;  Laterality: N/A;   TOTAL KNEE ARTHROPLASTY Right 04/26/2020   Procedure: RIGHT TOTAL KNEE ARTHROPLASTY;  Surgeon: Vernetta Lonni GRADE, MD;  Location: [facility];  Service: Orthopedics;  Laterality: Right;   TOTAL KNEE ARTHROPLASTY Left 09/25/2022   Procedure: LEFT TOTAL KNEE ARTHROPLASTY;  Surgeon: Vernetta Lonni GRADE, MD;  Location: [facility];  Service: Orthopedics;  Laterality: Left;    Allergies:  Allergies  Allergen Reactions   Cinnamon Flavoring Agent (Non-Screening) Other (See Comments)  Cinnamon chewing gum caused blisters on tongue    Family History:  No family history on file.  Social History:  Social History   Tobacco Use   Smoking status: Never   Smokeless tobacco: Never  Vaping Use   Vaping status: Never Used  Substance Use Topics   Alcohol use: No   Drug use: Never    ROS: Constitutional:  Negative for fever, chills, weight loss CV: Negative for chest pain, previous MI, hypertension Respiratory:  Negative for shortness of breath, wheezing, sleep apnea, frequent cough GI:  Negative for nausea, vomiting, bloody stool, GERD  Physical exam: BP (!) 159/63   Pulse 70   Ht 5' 9 (1.753 m)   Wt 190 lb (86.2 kg)   BMI 28.06 kg/m  GENERAL APPEARANCE:  Well appearing, well developed, well nourished, NAD HEENT:  Atraumatic, normocephalic, oropharynx clear NECK:  Supple without lymphadenopathy or thyromegaly ABDOMEN:  Soft, non-tender, no masses EXTREMITIES:  Moves all extremities well,  without clubbing, cyanosis, or edema NEUROLOGIC:  Alert and oriented x 3, normal gait, CN II-XII grossly intact MENTAL STATUS:  appropriate BACK:  Non-tender to palpation, No CVAT SKIN:  Warm, dry, and intact  Results: U/A: 0-5 WBCs, 0-2 RBCs

## 2024-06-15 ENCOUNTER — Encounter: Admitting: Orthopaedic Surgery

## 2024-06-19 ENCOUNTER — Ambulatory Visit (INDEPENDENT_AMBULATORY_CARE_PROVIDER_SITE_OTHER): Admitting: Orthopaedic Surgery

## 2024-06-19 ENCOUNTER — Encounter: Payer: Self-pay | Admitting: Orthopaedic Surgery

## 2024-06-19 DIAGNOSIS — T8459XD Infection and inflammatory reaction due to other internal joint prosthesis, subsequent encounter: Secondary | ICD-10-CM

## 2024-06-19 DIAGNOSIS — Z96652 Presence of left artificial knee joint: Secondary | ICD-10-CM

## 2024-06-19 NOTE — Progress Notes (Signed)
 The patient comes in today at almost 35-month status post an incision and drainage with synovectomy and polyliner exchange of an infected left total knee arthroplasty.  He says doing well and has good range of motion and strength and not much pain.  Examination of his left knee today shows range of motion is full.  The knee is ligamentously stable.  There is still some warmth and swelling of the knee but no redness.  I did offer him aspiration of the knee but he wants to hold off on that for now since he is doing so well.  With that being said I would like to see him back in 3 months.  Would like a standing AP and lateral of his left knee at that visit and if there is fluid in the knee then I would like to aspirate his knee.  He has now just finished all antibiotics.  If things worsen before then he will let us  know.

## 2024-06-20 ENCOUNTER — Ambulatory Visit: Admitting: Infectious Diseases

## 2024-06-29 ENCOUNTER — Other Ambulatory Visit: Payer: Self-pay | Admitting: Nurse Practitioner

## 2024-06-29 DIAGNOSIS — E1151 Type 2 diabetes mellitus with diabetic peripheral angiopathy without gangrene: Secondary | ICD-10-CM

## 2024-06-29 NOTE — Telephone Encounter (Signed)
 Requesting: metFORMIN  HCl ER 500 MG Oral Tablet Extended Release 24 Hour  Last Visit: 05/17/2024 Next Visit: 08/17/2024 Last Refill: 03/29/2024  Please Advise

## 2024-07-11 ENCOUNTER — Telehealth: Payer: Self-pay

## 2024-07-11 ENCOUNTER — Telehealth: Payer: Self-pay | Admitting: Nurse Practitioner

## 2024-07-11 ENCOUNTER — Other Ambulatory Visit (HOSPITAL_COMMUNITY): Payer: Self-pay

## 2024-07-11 DIAGNOSIS — E785 Hyperlipidemia, unspecified: Secondary | ICD-10-CM

## 2024-07-11 NOTE — Telephone Encounter (Signed)
 Pt came into office and need a refill on his atorvastatin  and need a prior auth.

## 2024-07-11 NOTE — Telephone Encounter (Signed)
 Per test claim: The current 90 day co-pay is, $0.  No PA needed at this time.    Refill needed.

## 2024-07-11 NOTE — Telephone Encounter (Signed)
 Pharmacy Patient Advocate Encounter   Received notification from Pt Calls Messages that prior authorization for ATORVASTATIN  10MG  is required/requested.   Insurance verification completed.   The patient is insured through [employer] .   Per test claim: The current 90 day co-pay is, $0.  No PA needed at this time. This test claim was processed through [facility]- copay amounts may vary at other pharmacies due to pharmacy/plan contracts, or as the patient moves through the different stages of their insurance plan.

## 2024-07-12 MED ORDER — ATORVASTATIN CALCIUM 10 MG PO TABS: 10.0000 mg | ORAL_TABLET | Freq: Every day | ORAL | 3 refills | Status: AC

## 2024-07-12 NOTE — Telephone Encounter (Signed)
 Requesting: atorvastatin   Last Visit: 05/17/2024 Next Visit: 08/17/2024 Last Refill: 02/22/2023 qty 90 Please Advise

## 2024-07-12 NOTE — Telephone Encounter (Signed)
 I called patient and notified him that Rx refilled.

## 2024-08-04 ENCOUNTER — Other Ambulatory Visit: Payer: Self-pay

## 2024-08-04 MED ORDER — METOPROLOL TARTRATE 100 MG PO TABS: 100.0000 mg | ORAL_TABLET | Freq: Two times a day (BID) | ORAL | 0 refills | Status: DC

## 2024-08-04 NOTE — Telephone Encounter (Signed)
 Requesting: Metoprolol   Last Visit: 05/17/2024 Next Visit: 08/17/2024 Last Refill: 02/02/2024  Please Advise

## 2024-08-09 NOTE — Telephone Encounter (Unsigned)
 Copied from CRM #[id number]. Topic: Clinical - Medication Refill >> Aug 09, 2024  2:49 PM Dedra B wrote: Medication: metoprolol  tartrate (LOPRESSOR ) 100 MG tablet. Pt prefers a 90 day supply for 1 year  Has the patient contacted their pharmacy? No the pharmacy contacted him  This is the patient's preferred pharmacy:  [facility] [address] [place] - [address] [place] [address] Phone: [phone] Fax: [phone]  Is this the correct pharmacy for this prescription? Yes If no, delete pharmacy and type the correct one.   Has the prescription been filled recently? No  Is the patient out of the medication? No has a few left  Has the patient been seen for an appointment in the last year OR does the patient have an upcoming appointment? Yes  Can we respond through MyChart? No pt wants someone to call him when it has been sent  Agent: Please be advised that Rx refills may take up to 3 business days. We ask that you follow-up with your pharmacy.

## 2024-08-17 ENCOUNTER — Ambulatory Visit: Admitting: Nurse Practitioner

## 2024-08-17 ENCOUNTER — Encounter: Payer: Self-pay | Admitting: Nurse Practitioner

## 2024-08-17 VITALS — BP 128/62 | HR 75 | Temp 97.5°F | Ht 69.0 in | Wt 203.4 lb

## 2024-08-17 DIAGNOSIS — N401 Enlarged prostate with lower urinary tract symptoms: Secondary | ICD-10-CM | POA: Diagnosis not present

## 2024-08-17 DIAGNOSIS — F418 Other specified anxiety disorders: Secondary | ICD-10-CM

## 2024-08-17 DIAGNOSIS — I10 Essential (primary) hypertension: Secondary | ICD-10-CM | POA: Diagnosis not present

## 2024-08-17 DIAGNOSIS — R32 Unspecified urinary incontinence: Secondary | ICD-10-CM

## 2024-08-17 DIAGNOSIS — E1151 Type 2 diabetes mellitus with diabetic peripheral angiopathy without gangrene: Secondary | ICD-10-CM

## 2024-08-17 DIAGNOSIS — C61 Malignant neoplasm of prostate: Secondary | ICD-10-CM | POA: Diagnosis not present

## 2024-08-17 DIAGNOSIS — N138 Other obstructive and reflux uropathy: Secondary | ICD-10-CM

## 2024-08-17 DIAGNOSIS — Z7984 Long term (current) use of oral hypoglycemic drugs: Secondary | ICD-10-CM

## 2024-08-17 DIAGNOSIS — E785 Hyperlipidemia, unspecified: Secondary | ICD-10-CM

## 2024-08-17 DIAGNOSIS — R0982 Postnasal drip: Secondary | ICD-10-CM

## 2024-08-17 LAB — POCT GLYCOSYLATED HEMOGLOBIN (HGB A1C)
HbA1c POC (<> result, manual entry): 8.1 % (ref 4.0–5.6)
HbA1c, POC (controlled diabetic range): 8.1 % — AB (ref 0.0–7.0)
HbA1c, POC (prediabetic range): 8.1 % — AB (ref 5.7–6.4)
Hemoglobin A1C: 8.1 % — AB (ref 4.0–5.6)

## 2024-08-17 LAB — POCT GLUCOSE (DEVICE FOR HOME USE)
Glucose Fasting, POC: 181 mg/dL — AB (ref 70–99)
POC Glucose: 181 mg/dL — AB (ref 70–99)

## 2024-08-17 MED ORDER — OXYBUTYNIN CHLORIDE ER 10 MG PO TB24: 10.0000 mg | ORAL_TABLET | Freq: Every day | ORAL | 0 refills | Status: DC

## 2024-08-17 MED ORDER — LEVOCETIRIZINE DIHYDROCHLORIDE 5 MG PO TABS: 5.0000 mg | ORAL_TABLET | Freq: Every evening | ORAL | 3 refills | Status: AC

## 2024-08-17 MED ORDER — DEXCOM G7 SENSOR MISC: 1.0000 | 0 refills | Status: DC

## 2024-08-17 NOTE — Progress Notes (Unsigned)
 Established Patient Office Visit  Subjective   Patient ID: Richard Harding, male    DOB: [birth year]  Age: 78 y.o. MRN: [id number]  Chief Complaint  Patient presents with   Diabetes    Follow up, Rx refill, concerns with bladder control, request a different allergy med-was o    HPI Discussed the use of AI scribe software for clinical note transcription with the patient, who gave verbal consent to proceed.  History of Present Illness   Richard Harding is a 78 year old male with diabetes and hypertension who presents for management of elevated blood sugar and medication review.  He is concerned about elevated blood sugar levels, as he has gained some weight and been eating sweets. He takes metformin  twice daily and is considering switching glucose monitoring systems due to device issues. He is aware of the need to manage his diet, particularly reducing sugar intake, and is considering using artificial sweeteners.  He experiences unexplained headaches, including tension-type and sinus headaches. He takes 2000 mg of vitamin C  daily and has stopped his allergy medication, which seemed to reduce headache frequency. He experiences postnasal drip and cough, especially in the mornings, and is open to trying a different allergy medication.  He expresses anxiety about driving, particularly on highways, describing a phobia that feels like being on a roller coaster. This has led to him slowing down significantly while driving. He is interested in trying over-the-counter options to manage this anxiety.  He takes fenofibrate  at a dose of 145 mg daily and a vitamin D  supplement. He is concerned about the cost of his medications, particularly those prescribed by his urologist, and is using a previous, more affordable medication for bladder control issues (flomax ), which include urgency and post-void dribbling. No burning sensation during urination.       ROS See pertinent positives and  negatives per HPI.    Objective:     BP 128/62 (BP Location: Left Arm, Patient Position: Sitting, Cuff Size: Normal)   Pulse 75   Temp (!) 97.5 F (36.4 C)   Ht 5' 9 (1.753 m)   Wt 203 lb 6.4 oz (92.3 kg)   SpO2 97%   BMI 30.04 kg/m  BP Readings from Last 3 Encounters:  08/17/24 128/62  06/14/24 (!) 159/63  05/17/24 (!) 118/56   Wt Readings from Last 3 Encounters:  08/17/24 203 lb 6.4 oz (92.3 kg)  06/14/24 190 lb (86.2 kg)  05/17/24 190 lb (86.2 kg)      Physical Exam Vitals and nursing note reviewed.  Constitutional:      Appearance: Normal appearance.  HENT:     Head: Normocephalic.  Eyes:     Conjunctiva/sclera: Conjunctivae normal.  Cardiovascular:     Rate and Rhythm: Normal rate and regular rhythm.     Pulses: Normal pulses.     Heart sounds: Normal heart sounds.  Pulmonary:     Effort: Pulmonary effort is normal.     Breath sounds: Normal breath sounds.  Musculoskeletal:     Cervical back: Normal range of motion.  Skin:    General: Skin is warm.  Neurological:     General: No focal deficit present.     Mental Status: He is alert and oriented to person, place, and time.  Psychiatric:        Mood and Affect: Mood normal.        Behavior: Behavior normal.        Thought Content: Thought content normal.  Judgment: Judgment normal.     The 10-year ASCVD risk score (Arnett DK, et al., 2019) is: 54.6%    Assessment & Plan:   Problem List Items Addressed This Visit       Cardiovascular and Mediastinum   Essential hypertension   Chronic, stable. Continue chlorthalidone  25mg  daily and metoprolol  100mg  BID. Check CMP, CBC today.       Relevant Orders   CBC with Differential/Platelet   Comprehensive metabolic panel with GFR   DM (diabetes mellitus) type II, controlled, with peripheral vascular disorder (HCC) - Primary   Chronic, not controlled. A1c today 8.1% and glucose 181. Discussed hyperglycemia symptoms and risks. Recommend reducing  intake of sweets and bread, and discussed using Stevia and monk fruit as sugar alternatives. Explore insurance coverage for Dexcom 7 and consider a reader device for glucose monitoring. Continue metformin  500mg  BID. Follow-up in 3 months.       Relevant Orders   POCT glycosylated hemoglobin (Hb A1C) (Completed)   POCT Glucose (Device for Home Use) (Completed)   CBC with Differential/Platelet   Comprehensive metabolic panel with GFR     Genitourinary   BPH with obstruction/lower urinary tract symptoms   Relevant Orders   PSA   Prostate cancer (HCC); GG 5; PSA 97.2; very high risk   Undergoing hormone therapy with hot flashes. Continue therapy as scheduled with the next session in October. Check PSA levels during the next lab visit. Continue collaboration and recommendations from urology.       Relevant Orders   PSA     Other   Hyperlipidemia LDL goal <70   Triglycerides elevated. Fenofibrate  increased to 145 mg daily. Continue this dosage and recheck cholesterol at the next fasting lab visit.      Relevant Orders   CBC with Differential/Platelet   Comprehensive metabolic panel with GFR   Lipid panel   Other Visit Diagnoses       Situational anxiety       Experiencing anxiety while driving, especially on highways. Recommend non-drowsy Dramamine for anxiety.     Postnasal drip       Postnasal drip and cough likely due to allergies. Loratadine  caused headaches, will switch to xyzal  5mg  daily.     Urinary incontinence, unspecified type       Was prescribed myrbetriq , however this is unaffordable. Will have him start oxybutynin  10mg  at bedtime.   Relevant Medications   oxybutynin  (DITROPAN  XL) 10 MG 24 hr tablet       Return in about 2 months (around 10/17/2024) for Diabetes.    Tinnie DELENA Harada, NP

## 2024-08-17 NOTE — Patient Instructions (Addendum)
 It was great to see you!  Stop loratadine  and start xyzal  5 mg daily for allergies  Come tomorrow at 9am for labs  I have ordered Oxybutynin  to help with bladder control. Take this at night   Start non-drowsy dramamine to help with anxiety for with driving  Try to cut back on the amount of sugar that you are eating  I have ordered dexcom g7   Let's follow-up in 3 months, sooner if you have concerns.  If a referral was placed today, you will be contacted for an appointment. Please note that routine referrals can sometimes take up to 3-4 weeks to process. Please call our office if you haven't heard anything after this time frame.  Take care,  Tinnie Harada, NP

## 2024-08-18 ENCOUNTER — Ambulatory Visit: Payer: Self-pay | Admitting: Nurse Practitioner

## 2024-08-18 ENCOUNTER — Other Ambulatory Visit (INDEPENDENT_AMBULATORY_CARE_PROVIDER_SITE_OTHER)

## 2024-08-18 DIAGNOSIS — E1151 Type 2 diabetes mellitus with diabetic peripheral angiopathy without gangrene: Secondary | ICD-10-CM

## 2024-08-18 DIAGNOSIS — N401 Enlarged prostate with lower urinary tract symptoms: Secondary | ICD-10-CM | POA: Diagnosis not present

## 2024-08-18 DIAGNOSIS — E785 Hyperlipidemia, unspecified: Secondary | ICD-10-CM | POA: Diagnosis not present

## 2024-08-18 DIAGNOSIS — C61 Malignant neoplasm of prostate: Secondary | ICD-10-CM

## 2024-08-18 DIAGNOSIS — I10 Essential (primary) hypertension: Secondary | ICD-10-CM

## 2024-08-18 DIAGNOSIS — N138 Other obstructive and reflux uropathy: Secondary | ICD-10-CM

## 2024-08-18 LAB — COMPREHENSIVE METABOLIC PANEL WITH GFR
ALT: 21 U/L (ref 0–53)
AST: 17 U/L (ref 0–37)
Albumin: 4.2 g/dL (ref 3.5–5.2)
Alkaline Phosphatase: 40 U/L (ref 39–117)
BUN: 20 mg/dL (ref 6–23)
CO2: 29 meq/L (ref 19–32)
Calcium: 9.6 mg/dL (ref 8.4–10.5)
Chloride: 97 meq/L (ref 96–112)
Creatinine, Ser: 1.24 mg/dL (ref 0.40–1.50)
GFR: 56.21 mL/min — ABNORMAL LOW (ref >60.00)
Glucose, Bld: 172 mg/dL — ABNORMAL HIGH (ref 70–99)
Potassium: 3.8 meq/L (ref 3.5–5.1)
Sodium: 138 meq/L (ref 135–145)
Total Bilirubin: 0.4 mg/dL (ref 0.2–1.2)
Total Protein: 7.1 g/dL (ref 6.0–8.3)

## 2024-08-18 LAB — CBC WITH DIFFERENTIAL/PLATELET
Basophils Absolute: 0 K/uL (ref 0.0–0.1)
Basophils Relative: 0.6 % (ref 0.0–3.0)
Eosinophils Absolute: 0.1 K/uL (ref 0.0–0.7)
Eosinophils Relative: 2.5 % (ref 0.0–5.0)
HCT: 31.6 % — ABNORMAL LOW (ref 39.0–52.0)
Hemoglobin: 10.7 g/dL — ABNORMAL LOW (ref 13.0–17.0)
Lymphocytes Relative: 22.7 % (ref 12.0–46.0)
Lymphs Abs: 0.9 K/uL (ref 0.7–4.0)
MCHC: 33.9 g/dL (ref 30.0–36.0)
MCV: 81 fl (ref 78.0–100.0)
Monocytes Absolute: 0.4 K/uL (ref 0.1–1.0)
Monocytes Relative: 10.2 % (ref 3.0–12.0)
Neutro Abs: 2.6 K/uL (ref 1.4–7.7)
Neutrophils Relative %: 64 % (ref 43.0–77.0)
Platelets: 297 K/uL (ref 150.0–400.0)
RBC: 3.9 Mil/uL — ABNORMAL LOW (ref 4.22–5.81)
RDW: 14.1 % (ref 11.5–15.5)
WBC: 4 K/uL (ref 4.0–10.5)

## 2024-08-18 LAB — LIPID PANEL
Cholesterol: 150 mg/dL (ref 0–200)
HDL: 35.4 mg/dL — ABNORMAL LOW (ref >39.00)
LDL Cholesterol: 67 mg/dL (ref 0–99)
NonHDL: 114.31
Total CHOL/HDL Ratio: 4
Triglycerides: 235 mg/dL — ABNORMAL HIGH (ref 0.0–149.0)
VLDL: 47 mg/dL — ABNORMAL HIGH (ref 0.0–40.0)

## 2024-08-18 LAB — PSA: PSA: 0.06 ng/mL — ABNORMAL LOW (ref 0.10–4.00)

## 2024-08-18 NOTE — Assessment & Plan Note (Signed)
 Undergoing hormone therapy with hot flashes. Continue therapy as scheduled with the next session in October. Check PSA levels during the next lab visit. Continue collaboration and recommendations from urology.

## 2024-08-18 NOTE — Assessment & Plan Note (Signed)
 Chronic, not controlled. A1c today 8.1% and glucose 181. Discussed hyperglycemia symptoms and risks. Recommend reducing intake of sweets and bread, and discussed using Stevia and monk fruit as sugar alternatives. Explore insurance coverage for Dexcom 7 and consider a reader device for glucose monitoring. Continue metformin  500mg  BID. Follow-up in 3 months.

## 2024-08-18 NOTE — Assessment & Plan Note (Signed)
 Triglycerides elevated. Fenofibrate  increased to 145 mg daily. Continue this dosage and recheck cholesterol at the next fasting lab visit.

## 2024-08-18 NOTE — Addendum Note (Signed)
 Addended by: Azarie Coriz A on: 08/18/2024 10:07 PM   Modules accepted: Level of Service

## 2024-08-18 NOTE — Assessment & Plan Note (Signed)
 Chronic, stable. Continue chlorthalidone  25mg  daily and metoprolol  100mg  BID. Check CMP, CBC today.

## 2024-08-31 ENCOUNTER — Other Ambulatory Visit: Payer: Self-pay

## 2024-08-31 DIAGNOSIS — I10 Essential (primary) hypertension: Secondary | ICD-10-CM

## 2024-08-31 MED ORDER — CHLORTHALIDONE 25 MG PO TABS: 25.0000 mg | ORAL_TABLET | Freq: Every day | ORAL | 1 refills | Status: DC

## 2024-08-31 NOTE — Telephone Encounter (Signed)
 Requesting: chlorthalidone  (HYGROTON ) 25 MG tablet  Last Visit: 08/17/2024 Next Visit: 10/20/2024 Last Refill: 02/25/2024  Please Advise

## 2024-09-08 ENCOUNTER — Ambulatory Visit: Payer: Self-pay

## 2024-09-08 ENCOUNTER — Ambulatory Visit: Admitting: Family Medicine

## 2024-09-08 ENCOUNTER — Encounter: Payer: Self-pay | Admitting: Family Medicine

## 2024-09-08 VITALS — BP 131/54 | HR 60 | Temp 97.1°F | Resp 18 | Wt 205.8 lb

## 2024-09-08 DIAGNOSIS — I952 Hypotension due to drugs: Secondary | ICD-10-CM | POA: Diagnosis not present

## 2024-09-08 DIAGNOSIS — E1151 Type 2 diabetes mellitus with diabetic peripheral angiopathy without gangrene: Secondary | ICD-10-CM | POA: Diagnosis not present

## 2024-09-08 DIAGNOSIS — R519 Headache, unspecified: Secondary | ICD-10-CM

## 2024-09-08 DIAGNOSIS — N3281 Overactive bladder: Secondary | ICD-10-CM

## 2024-09-08 DIAGNOSIS — C61 Malignant neoplasm of prostate: Secondary | ICD-10-CM | POA: Diagnosis not present

## 2024-09-08 MED ORDER — DEXCOM G7 RECEIVER DEVI: 1.0000 | 0 refills | Status: DC | PRN

## 2024-09-08 MED ORDER — RIZATRIPTAN BENZOATE 10 MG PO TABS: ORAL_TABLET | ORAL | 0 refills | Status: AC

## 2024-09-08 NOTE — Patient Instructions (Signed)
  VISIT SUMMARY: Today, you were seen for ongoing headaches that have been occurring for the past few days. We discussed your history of prostate cancer, diabetes, and other health concerns. We have made some changes to your medications and recommended further tests to better understand your symptoms.  YOUR PLAN: HEADACHE: You have been experiencing headaches primarily in the back of your head, which resemble sinus infection headaches. These headaches have been severe enough to cause nausea. -We will order an MRI of your brain to rule out any serious issues and to check your sinuses. -You are prescribed rizatriptan  to help with the headaches. -Continue taking naproxen  as needed, but do not exceed the recommended dose. -Keep a headache diary to track the frequency and severity of your headaches. -Schedule an eye examination to rule out vision-related causes. -Monitor your caffeine intake as it may be contributing to your headaches.  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 -Stop taking chlorthalidone  for now. -Monitor your blood pressure at home and keep a record of the readings.  OVERACTIVE BLADDER SYMPTOMS: Your symptoms may be worsened by chlorthalidone , a diuretic. -Stop taking chlorthalidone  for now.  TYPE 2 DIABETES MELLITUS: Your blood glucose and A1c levels are well-managed. -Continue with your current diabetes management plan.  PROSTATE CANCER, ON HORMONE THERAPY: You are currently on hormone therapy for prostate cancer, and your PSA levels are well-managed. -We will order an MRI of your brain to rule out any metastasis.  INTERMITTENT ALLERGIC RHINITIS: You have a cough due to postnasal drip from allergies. -Consider restarting your allergy medication if the cough persists.

## 2024-09-08 NOTE — Telephone Encounter (Signed)
 FYI Only or Action Required?: Action required by provider: Pt is requesting lab results from last month be mailed to him.  Patient was last seen in primary care on 08/17/2024 by Nedra Tinnie LABOR, NP.  Called Nurse Triage reporting Headache.  Symptoms began several days ago.  Interventions attempted: OTC medications: Aleve .  Symptoms are: unchanged.  Triage Disposition: See Physician Within 24 Hours  Patient/caregiver understands and will follow disposition?: Yes                      Copied from CRM [id number]. Topic: Clinical - Red Word Triage >> Sep 08, 2024  9:25 AM Richard Harding wrote: Patient has been having a severe headache for a few days - requested appt for today Reason for Disposition  [1] MODERATE headache (e.g., interferes with normal activities) AND [2] present > 24 hours AND [3] unexplained  (Exceptions: Pain medicines not tried, typical migraine, or headache part of viral illness.)  Answer Assessment - Initial Assessment Questions 1. LOCATION: Where does it hurt?      back 2. ONSET: When did the headache start? (e.g., minutes, hours, days)      A few days 3. PATTERN: Does the pain come and go, or has it been constant since it started?     Comes each afternoon 4. SEVERITY: How bad is the pain? and What does it keep you from doing?  (e.g., Scale 1-10; mild, moderate, or severe)     moderate  6. CAUSE: What do you think is causing the headache?     Unsure  8. HEAD INJURY: Has there been any recent injury to your head?      no 9. OTHER SYMPTOMS: Do you have any other symptoms? (e.g., fever, stiff neck, eye pain, sore throat, cold symptoms)     Felt feverish last night  Protocols used: Headache-A-AH

## 2024-09-08 NOTE — Progress Notes (Signed)
 Assessment & Plan   Assessment/Plan:    Assessment and Plan Assessment & Plan Headache, suspected migraine versus tension-type Intermittent headaches for a few days, primarily occipital, resembling sinus infection headaches. No fever, chills, or sinus pain. No migraine history, but symptoms suggest atypical migraine or tension-type headache. Headaches onset in the afternoon, possibly related to screen time or caffeine intake. Low suspicion of prostate cancer metastasis to the brain. - Order STAT MRI of the brain to rule out metastasis and assess sinuses - Prescribe rizatriptan  for migraine - Continue naproxen  as needed - Advise to keep a headache diary - Recommend eye examination - Advise to monitor caffeine intake  Iatrogenic hypotension due to antihypertensives Blood pressure mildly low at 131/54. On multiple antihypertensives. Potential contribution to headaches. - Hold chlorthalidone  - Monitor blood pressure at home  Overactive bladder symptoms Symptoms potentially exacerbated by chlorthalidone , a diuretic. - Hold chlorthalidone   Type 2 diabetes mellitus with diabetic peripheral angiopathy without gangrene Blood glucose and A1c levels well-managed. No indication that diabetes is causing headaches.  Prostate cancer, on hormone therapy On hormone therapy. PSA levels well-managed. Low suspicion of metastasis to the brain. - Order MRI of the brain  Intermittent allergic rhinitis Intermittent cough due to postnasal drip. Previous cessation of allergy medication did not resolve headaches.      Medications Discontinued During This Encounter  Medication Reason   chlorthalidone  (HYGROTON ) 25 MG tablet     Return for blood pressure with Lauren McElwee.        Subjective:   Encounter date: 09/08/2024  Richard Harding is a 78 y.o. 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   Discussed the use of AI scribe software for clinical note transcription with the patient, who gave verbal consent to proceed.  History of Present Illness Richard Harding Jerid Catherman is a 78 year old male with a history of prostate cancer and migraines who presents with ongoing headaches.  Cephalalgia (headache) - Ongoing headaches for the past few days, primarily occipital in location - Headaches resemble those experienced during prior sinus infections - Onset typically around noontime or in the afternoon; absent in the morning - Severity can be sufficient to cause nausea, but no vomiting - Headaches persist despite use of ibuprofen 800 mg and naproxen  sodium 220 mg - Took four naproxen  pills last night, exceeding recommended dose - No history of migraines - No  stuffy nose, sinus pain, or new cough - Chronic cough attributed  to postnasal drip from allergies - Discontinued allergy medication a few weeks ago, but headaches persisted - Concerned about potential medication interactions contributing to headaches  Prostate cancer and hormone therapy - History of prostate cancer - Currently undergoing hormone treatments every six months - Experiencing hot flashes as a side effect of hormone therapy  Antihypertensive therapy and blood pressure - On multiple antihypertensives: metoprolol , alfuzosin , and chlorthalidone  - Recent blood pressure reading of 131/54, described as mildly low - Concerned about impact of medications on renal and hepatic function, especially with naproxen  use  Allergic rhinitis and postnasal drip - Discontinued allergy medication a few weeks ago, but cough persists - Does not use nasal sprays and avoids anything in the nose, including water  when swimming  Diabetes mellitus - Diabetic with recent blood tests showing stable glucose levels and A1c  Vision changes - Due for an eye check-up this month - Believes vision may be slightly deteriorating  Knee surgery and infection history - History of knee surgery - Previously on antibiotics for several months due to suspected knee infection - Not currently on antibiotics  Caffeine use - Drinks tea every morning - No recent missed caffeinated drinks  Vitamin supplementation - Takes 2000 mg of vitamin C  daily, believes it helps with cold and flu prevention     ROS  Past Surgical History:  Procedure Laterality Date   ANTERIOR FUSION CERVICAL SPINE  1990   Thinks it was C-3-4  done here at [facility]   BILATERAL CARPAL TUNNEL RELEASE Bilateral    1990s   COLONOSCOPY  2011   EXCISIONAL TOTAL KNEE ARTHROPLASTY WITH ANTIBIOTIC SPACERS Left 03/24/2024   Procedure: LEFT KNEE SYNOVECTOMY, IRRIGATION AND DEBRIDEMENT WITH POLY EXCHANGE;  Surgeon: Vernetta Lonni GRADE, MD;  Location:  [facility];  Service: Orthopedics;  Laterality: Left;   GOLD SEED IMPLANT N/A 11/23/2023   Procedure: GOLD SEED IMPLANT;  Surgeon: Roseann Adine PARAS., MD;  Location: [facility];  Service: Urology;  Laterality: N/A;   SPACE OAR INSTILLATION N/A 11/23/2023   Procedure: SPACE OAR INSTILLATION;  Surgeon: Roseann Adine PARAS., MD;  Location: [facility];  Service: Urology;  Laterality: N/A;   TOTAL KNEE ARTHROPLASTY Right 04/26/2020   Procedure: RIGHT TOTAL KNEE ARTHROPLASTY;  Surgeon: Vernetta Lonni GRADE, MD;  Location: [facility];  Service: Orthopedics;  Laterality: Right;   TOTAL KNEE ARTHROPLASTY Left 09/25/2022   Procedure: LEFT TOTAL KNEE ARTHROPLASTY;  Surgeon: Vernetta Lonni GRADE, MD;  Location: [facility];  Service: Orthopedics;  Laterality: Left;    Outpatient Medications Prior to Visit  Medication Sig Dispense Refill   alfuzosin  (UROXATRAL ) 10 MG 24 hr tablet Take 1 tablet (10 mg total) by mouth daily with breakfast. 30 tablet 11   Ascorbic Acid  (VITAMIN C ) 1000 MG tablet Take 1,000 mg by mouth 2 (two) times daily.     aspirin  81 MG chewable tablet Chew 1 tablet (81 mg total) by mouth 2 (two) times daily. 30 tablet 0   atorvastatin  (LIPITOR) 10 MG tablet Take 1 tablet (10 mg total) by mouth daily. 90 tablet 3   b complex vitamins capsule Take 1 capsule by mouth daily.     cefadroxil  (DURICEF) 500 MG capsule Take 2 capsules (1,000 mg total) by mouth 2 (two) times daily. 120 capsule 5   Continuous Glucose Sensor (DEXCOM G7 SENSOR) MISC 1 each by Does not apply route every 14 (fourteen) days. 6 each 0   fenofibrate  (TRICOR ) 145 MG tablet Take 1 tablet (145 mg  total) by mouth daily. 90 tablet 1   Flaxseed, Linseed, (FLAXSEED OIL) 1200 MG CAPS Take by mouth.     Ginkgo Biloba 60 MG TABS Take by mouth.     levocetirizine (XYZAL ) 5 MG tablet Take 1 tablet (5 mg total) by mouth every evening. 90 tablet 3   LUTEIN PO Take 1 capsule by mouth daily.     Mac[place] 500 MG  CAPS Take by mouth.     metFORMIN  (GLUCOPHAGE -XR) 500 MG 24 hr tablet TAKE 1 TABLET BY MOUTH TWICE DAILY WITH A MEAL 180 tablet 1   metoprolol  tartrate (LOPRESSOR ) 100 MG tablet Take 1 tablet (100 mg total) by mouth 2 (two) times daily. 180 tablet 0   naproxen  sodium (ALEVE ) 220 MG tablet Take 440 mg by mouth 2 (two) times daily as needed.     omega-3 acid ethyl esters (LOVAZA ) 1 g capsule Take 2 capsules (2 g total) by mouth 2 (two) times daily. 360 capsule 3   OVER THE COUNTER MEDICATION Take 1 capsule by mouth daily. Mens Prostate formula supplement     oxybutynin  (DITROPAN  XL) 10 MG 24 hr tablet Take 1 tablet (10 mg total) by mouth at bedtime. 30 tablet 0   potassium chloride  SA (KLOR-CON  M) 20 MEQ tablet Take 1 tablet (20 mEq total) by mouth daily. 90 tablet 1   solifenacin  (VESICARE ) 5 MG tablet Take 5 mg by mouth daily.     ST JOHNS WORT PO Take 1 tablet by mouth daily.     triamcinolone ointment (KENALOG) 0.1 % Apply 1 Application topically 2 (two) times daily as needed for rash.     Turmeric 500 MG CAPS Take 500 mg by mouth daily.     chlorthalidone  (HYGROTON ) 25 MG tablet Take 1 tablet (25 mg total) by mouth daily. 90 tablet 1   methocarbamol  (ROBAXIN ) 500 MG tablet Take 1 tablet (500 mg total) by mouth every 6 (six) hours as needed for muscle spasms. (Patient not taking: Reported on 09/08/2024) 30 tablet 0   rifampin  (RIFADIN ) 300 MG capsule Take 1 capsule (300 mg total) by mouth 2 (two) times daily. (Patient not taking: Reported on 09/08/2024) 60 capsule 0   No facility-administered medications prior to visit.    No family history on file.  Social History   Socioeconomic History   Marital status: Single    Spouse name: Not on file   Number of children: 0   Years of education: Not on file   Highest education level: Not on file  Occupational History   Occupation: retired  Tobacco Use   Smoking status: Never   Smokeless tobacco: Never  Vaping Use   Vaping status: Never Used   Substance and Sexual Activity   Alcohol use: No   Drug use: Never   Sexual activity: Not Currently  Other Topics Concern   Not on file  Social History Narrative   Single - lives alone - no children - no family nearby   Social Drivers of Corporate investment banker Strain: Low Risk  (07/19/2023)   Overall Financial Resource Strain (CARDIA)    Difficulty of Paying Living Expenses: Not hard at all  Food Insecurity: Food Insecurity Present (03/24/2024)   Hunger Vital Sign    Worried About Running Out of Food in the Last Year: Sometimes true    Ran Out of Food in the Last Year: Sometimes true  Transportation Needs: No Transportation Needs (03/24/2024)   PRAPARE - Transportation    Lack  of Transportation (Medical): No    Lack of Transportation (Non-Medical): No  Physical Activity: Inactive (07/19/2023)   Exercise Vital Sign    Days of Exercise per Week: 0 days    Minutes of Exercise per Session: 0 min  Stress: No Stress Concern Present (07/19/2023)   [employer] of Occupational Health - Occupational Stress Questionnaire    Feeling of Stress : Only a little  Social Connections: Socially Isolated (03/24/2024)   Social Connection and Isolation Panel    Frequency of Communication with Friends and Family: More than three times a week    Frequency of Social Gatherings with Friends and Family: Once a week    Attends Religious Services: Never    Database administrator or Organizations: No    Attends Banker Meetings: Never    Marital Status: Divorced  Catering manager Violence: Not At Risk (03/24/2024)   Humiliation, Afraid, Rape, and Kick questionnaire    Fear of Current or Ex-Partner: No    Emotionally Abused: No    Physically Abused: No    Sexually Abused: No                                                                                                 Objective:  Physical Exam: BP (!) 131/54 (BP Location: Right Arm, Patient Position: Sitting, Cuff Size: Normal)  Comment: recheck  Pulse 60   Temp (!) 97.1 F (36.2 C) (Temporal)   Resp 18   Wt 205 lb 12.8 oz (93.4 kg)   SpO2 97%   BMI 30.39 kg/m    Physical Exam GENERAL: Alert, cooperative, well developed, no acute distress HEENT: Normocephalic, normal oropharynx, moist mucous membranes, cerumen in both ears, otherwise normal, eyes normal NECK: Neck normal CHEST: Clear to auscultation bilaterally, no wheezes, rhonchi, or crackles CARDIOVASCULAR: Normal heart rate and rhythm, S1 and S2 normal without murmurs ABDOMEN: Soft, non-tender, non-distended, without organomegaly, normal bowel sounds EXTREMITIES: No cyanosis or edema NEUROLOGICAL: Cranial nerves grossly intact, moves all extremities without gross motor or sensory deficit   Physical Exam  No results found.  Recent Results (from the past 2160 hours)  Urinalysis, Routine w reflex microscopic     Status: Abnormal   Collection Time: 06/14/24 12:00 AM  Result Value Ref Range   Specific Gravity, UA 1.020 1.005 - 1.030   pH, UA 6.5 5.0 - 7.5   Color, UA Yellow Yellow   Appearance Ur Clear Clear   Leukocytes,UA Negative Negative   Protein,UA Trace Negative/Trace   Glucose, UA Negative Negative   Ketones, UA Trace (A) Negative   RBC, UA Negative Negative   Bilirubin, UA Negative Negative   Urobilinogen, Ur 0.2 0.2 - 1.0 mg/dL   Nitrite, UA Negative Negative   Microscopic Examination See below:   Microscopic Examination     Status: Abnormal   Collection Time: 06/14/24 12:00 AM   Urine  Result Value Ref Range   WBC, UA 0-5 0 - 5 /hpf   RBC, Urine 0-2 0 - 2 /hpf   Epithelial Cells (non renal) 0-10 0 - 10 /hpf  Casts Present None seen /lpf   Cast Type Hyaline casts N/A   Mucus, UA Present (A) Not Estab.   Bacteria, UA Few None seen/Few  POCT Glucose (Device for Home Use)     Status: Abnormal   Collection Time: 08/17/24  3:53 PM  Result Value Ref Range   Glucose Fasting, POC 181 (A) 70 - 99 mg/dL   POC Glucose 818 (A) 70 - 99  mg/dl  POCT glycosylated hemoglobin (Hb A1C)     Status: Abnormal   Collection Time: 08/17/24  3:54 PM  Result Value Ref Range   Hemoglobin A1C 8.1 (A) 4.0 - 5.6 %   HbA1c POC (<> result, manual entry) 8.1 4.0 - 5.6 %   HbA1c, POC (prediabetic range) 8.1 (A) 5.7 - 6.4 %   HbA1c, POC (controlled diabetic range) 8.1 (A) 0.0 - 7.0 %  Lipid panel     Status: Abnormal   Collection Time: 08/18/24  9:41 AM  Result Value Ref Range   Cholesterol 150 0 - 200 mg/dL    Comment: ATP III Classification       Desirable:  < 200 mg/dL               Borderline High:  200 - 239 mg/dL          High:  > = 759 mg/dL   Triglycerides 764.9 (H) 0.0 - 149.0 mg/dL    Comment: Normal:  <849 mg/dLBorderline High:  150 - 199 mg/dL   HDL 64.59 (L) >60.99 mg/dL   VLDL 52.9 (H) 0.0 - 59.9 mg/dL   LDL Cholesterol 67 0 - 99 mg/dL   Total CHOL/HDL Ratio 4     Comment:                Men          Women1/2 Average Risk     3.4          3.3Average Risk          5.0          4.42X Average Risk          9.6          7.13X Average Risk          15.0          11.0                       NonHDL 114.31     Comment: NOTE:  Non-HDL goal should be 30 mg/dL higher than patient's LDL goal (i.e. LDL goal of < 70 mg/dL, would have non-HDL goal of < 100 mg/dL)  PSA     Status: Abnormal   Collection Time: 08/18/24  9:41 AM  Result Value Ref Range   PSA 0.06 (L) 0.10 - 4.00 ng/mL    Comment: Test performed using Access Hybritech PSA Assay, a parmagnetic partical, chemiluminecent immunoassay.  Comprehensive metabolic panel with GFR     Status: Abnormal   Collection Time: 08/18/24  9:41 AM  Result Value Ref Range   Sodium 138 135 - 145 mEq/L   Potassium 3.8 3.5 - 5.1 mEq/L   Chloride 97 96 - 112 mEq/L   CO2 29 19 - 32 mEq/L   Glucose, Bld 172 (H) 70 - 99 mg/dL   BUN 20 6 - 23 mg/dL   Creatinine, Ser 8.75 0.40 - 1.50 mg/dL   Total Bilirubin 0.4 0.2 - 1.2 mg/dL   Alkaline Phosphatase 40  39 - 117 U/L   AST 17 0 - 37 U/L   ALT 21 0 - 53  U/L   Total Protein 7.1 6.0 - 8.3 g/dL   Albumin 4.2 3.5 - 5.2 g/dL   GFR 43.78 (L) >39.99 mL/min    Comment: Calculated using the CKD-EPI Creatinine Equation (2021)   Calcium  9.6 8.4 - 10.5 mg/dL  CBC with Differential/Platelet     Status: Abnormal   Collection Time: 08/18/24  9:41 AM  Result Value Ref Range   WBC 4.0 4.0 - 10.5 K/uL   RBC 3.90 (L) 4.22 - 5.81 Mil/uL   Hemoglobin 10.7 (L) 13.0 - 17.0 g/dL   HCT 68.3 (L) 60.9 - 47.9 %   MCV 81.0 78.0 - 100.0 fl   MCHC 33.9 30.0 - 36.0 g/dL   RDW 85.8 88.4 - 84.4 %   Platelets 297.0 150.0 - 400.0 K/uL   Neutrophils Relative % 64.0 43.0 - 77.0 %   Lymphocytes Relative 22.7 12.0 - 46.0 %   Monocytes Relative 10.2 3.0 - 12.0 %   Eosinophils Relative 2.5 0.0 - 5.0 %   Basophils Relative 0.6 0.0 - 3.0 %   Neutro Abs 2.6 1.4 - 7.7 K/uL   Lymphs Abs 0.9 0.7 - 4.0 K/uL   Monocytes Absolute 0.4 0.1 - 1.0 K/uL   Eosinophils Absolute 0.1 0.0 - 0.7 K/uL   Basophils Absolute 0.0 0.0 - 0.1 K/uL        Beverley Adine Hummer, MD, MS

## 2024-09-08 NOTE — Telephone Encounter (Signed)
 noted

## 2024-09-08 NOTE — Telephone Encounter (Signed)
 I called and spoke with patient and he is requesting to pick up lab results today at his appointment with Dr. Sebastian. I will leave with Dr. Sebastian and U[place]a.. Patient aware.

## 2024-09-12 ENCOUNTER — Other Ambulatory Visit: Payer: Self-pay | Admitting: Nurse Practitioner

## 2024-09-12 NOTE — Telephone Encounter (Signed)
 Requesting: Oxybutynin  Chloride ER 10 MG Oral Tablet Extended Release 24 Hour  Last Visit: 08/17/2024 Next Visit: 10/20/2024 Last Refill: 08/17/2024  Please Advise

## 2024-09-13 NOTE — Telephone Encounter (Signed)
 Patient notified of refill and to not take both medications together-Oxybutynin  and Solifenacin .

## 2024-09-13 NOTE — Telephone Encounter (Signed)
 I called and spoke with patient and he is taking both medications but will stop taking the Vesicare . Patient is requesting that you fill Oxybutynin  and he will follow up with urology and has an appointment at the end of the month.

## 2024-09-18 DIAGNOSIS — H524 Presbyopia: Secondary | ICD-10-CM | POA: Diagnosis not present

## 2024-09-18 LAB — HM DIABETES EYE EXAM

## 2024-09-19 ENCOUNTER — Ambulatory Visit: Payer: Self-pay | Admitting: Family Medicine

## 2024-09-19 ENCOUNTER — Ambulatory Visit (HOSPITAL_BASED_OUTPATIENT_CLINIC_OR_DEPARTMENT_OTHER)
Admission: RE | Admit: 2024-09-19 | Discharge: 2024-09-19 | Disposition: A | Discharge status: Home / Self Care | Source: Ambulatory Visit | Attending: Family Medicine | Admitting: Family Medicine

## 2024-09-19 DIAGNOSIS — C61 Malignant neoplasm of prostate: Secondary | ICD-10-CM | POA: Diagnosis not present

## 2024-09-19 DIAGNOSIS — C801 Malignant (primary) neoplasm, unspecified: Secondary | ICD-10-CM | POA: Diagnosis not present

## 2024-09-19 DIAGNOSIS — R519 Headache, unspecified: Secondary | ICD-10-CM | POA: Insufficient documentation

## 2024-09-19 DIAGNOSIS — C7931 Secondary malignant neoplasm of brain: Secondary | ICD-10-CM | POA: Diagnosis not present

## 2024-09-19 DIAGNOSIS — I6782 Cerebral ischemia: Secondary | ICD-10-CM | POA: Diagnosis not present

## 2024-09-19 MED ORDER — GADOBUTROL 1 MMOL/ML IV SOLN
9.3000 mL | Freq: Once | INTRAVENOUS | Status: AC | PRN
  Administered 2024-09-19: 9.3 mL via INTRAVENOUS

## 2024-09-20 ENCOUNTER — Encounter: Payer: Self-pay | Admitting: Orthopaedic Surgery

## 2024-09-20 ENCOUNTER — Other Ambulatory Visit: Payer: Self-pay

## 2024-09-20 ENCOUNTER — Ambulatory Visit: Admitting: Orthopaedic Surgery

## 2024-09-20 DIAGNOSIS — Z96652 Presence of left artificial knee joint: Secondary | ICD-10-CM

## 2024-09-20 NOTE — Progress Notes (Signed)
 The patient is following up 6 months after having a left knee open synovectomy and a polyliner exchange.  He is off all antibiotics.  The findings did show a rare Staph epidermidis that was sensitive to antibiotics from soft tissue cultures.  He does report some knee swelling and he says that it is minimal to him.  On exam he does have a mild to moderate effusion of his left knee with good range of motion but is not red.  2 views of the left knee show well-seated press-fit total knee arthroplasty.  I told him I am concerned about the fluid in his knee and I would like to aspirate his knee today and send that fluid for Gram stain and culture and cell count.  However he said he would prefer not to have this removed today and that is getting smaller and he is not concerned about it.  With that being said, we will see him back in 3 months time with a repeat exam but no x-rays are needed.  If things worsen he knows to reach out and let us  know.

## 2024-09-21 ENCOUNTER — Encounter: Payer: Self-pay | Admitting: Nurse Practitioner

## 2024-09-21 NOTE — Progress Notes (Signed)
 Richard Harding                                          MRN: [id number]   09/21/2024   The [facility] Quality Team Specialist reviewed this patient medical record for the purposes of chart review for care gap closure. The following were reviewed: abstraction for care gap closure-kidney health evaluation for diabetes:eGFR  and uACR.    [facility] Quality Team

## 2024-10-16 ENCOUNTER — Other Ambulatory Visit: Payer: Self-pay | Admitting: Nurse Practitioner

## 2024-10-16 DIAGNOSIS — N3281 Overactive bladder: Secondary | ICD-10-CM

## 2024-10-20 ENCOUNTER — Ambulatory Visit: Admitting: Nurse Practitioner

## 2024-10-23 ENCOUNTER — Encounter: Payer: Self-pay | Admitting: Radiology

## 2024-10-29 ENCOUNTER — Other Ambulatory Visit: Payer: Self-pay | Admitting: Nurse Practitioner

## 2024-11-06 ENCOUNTER — Encounter: Payer: Self-pay | Admitting: Nurse Practitioner

## 2024-11-06 ENCOUNTER — Ambulatory Visit: Payer: Self-pay | Admitting: Nurse Practitioner

## 2024-11-06 ENCOUNTER — Ambulatory Visit (INDEPENDENT_AMBULATORY_CARE_PROVIDER_SITE_OTHER): Admitting: Nurse Practitioner

## 2024-11-06 VITALS — BP 126/68 | HR 80 | Temp 96.9°F | Ht 69.0 in | Wt 208.6 lb

## 2024-11-06 DIAGNOSIS — B356 Tinea cruris: Secondary | ICD-10-CM

## 2024-11-06 DIAGNOSIS — R519 Headache, unspecified: Secondary | ICD-10-CM | POA: Diagnosis not present

## 2024-11-06 DIAGNOSIS — E1151 Type 2 diabetes mellitus with diabetic peripheral angiopathy without gangrene: Secondary | ICD-10-CM

## 2024-11-06 DIAGNOSIS — I10 Essential (primary) hypertension: Secondary | ICD-10-CM | POA: Diagnosis not present

## 2024-11-06 DIAGNOSIS — G8929 Other chronic pain: Secondary | ICD-10-CM

## 2024-11-06 DIAGNOSIS — R0609 Other forms of dyspnea: Secondary | ICD-10-CM | POA: Diagnosis not present

## 2024-11-06 NOTE — Progress Notes (Unsigned)
 Established Patient Office Visit  Subjective   Patient ID: Richard Harding, male    DOB: December 18, 1946  Age: 78 y.o. MRN: [id number]  Chief Complaint  Patient presents with   Diabetes    Follow up, concerns with headaches    HPI Discussed the use of AI scribe software for clinical note transcription with the patient, who gave verbal consent to proceed.  History of Present Illness   Richard Harding is a 78 year old male with hypertension who presents with headaches and a groin rash.  He experiences headaches since his last visit with Dr. Sebastian, often in the occipital region and neck, sometimes resembling sinus headaches. They frequently occur after meals. Aleve  gel provides relief. There is no photophobia or nausea. Neck pain is occasional and not always concurrent with headaches.  He has a groin rash, previously red, now less so, and uses hydrocortisone and antifungal cream. He is concerned about spreading it during travel.  He manages hypertension with metoprolol  twice daily, having stopped chlorthalidone  due to hypotension. He plans to carry his medication while traveling.  He experiences fatigue and dyspnea with minimal exertion. There is no chest pain or significant peripheral edema. He has concerns about his heart and wonders if he needs to see a specialist.   He is on medication for blood sugar management but has not been checking levels regularly. He brought his dexcom G7 meter to be applied today.       ROS See pertinent positives and negatives per HPI.    Objective:     BP 126/68 (BP Location: Left Arm, Patient Position: Sitting, Cuff Size: Normal)   Pulse 80   Temp (!) 96.9 F (36.1 C)   Ht 5' 9 (1.753 m)   Wt 208 lb 9.6 oz (94.6 kg)   SpO2 99%   BMI 30.80 kg/m  BP Readings from Last 3 Encounters:  11/06/24 126/68  09/08/24 (!) 131/54  08/17/24 128/62   Wt Readings from Last 3 Encounters:  11/06/24 208 lb 9.6 oz (94.6 kg)  09/08/24 205  lb 12.8 oz (93.4 kg)  08/17/24 203 lb 6.4 oz (92.3 kg)      Physical Exam Vitals and nursing note reviewed.  Constitutional:      Appearance: Normal appearance.  HENT:     Head: Normocephalic.  Eyes:     Conjunctiva/sclera: Conjunctivae normal.  Cardiovascular:     Rate and Rhythm: Normal rate and regular rhythm.     Pulses: Normal pulses.     Heart sounds: Normal heart sounds.  Pulmonary:     Effort: Pulmonary effort is normal.     Breath sounds: Normal breath sounds.  Musculoskeletal:     Cervical back: Normal range of motion.  Skin:    General: Skin is warm.     Findings: Rash (erythema to inner groin bilaterally) present.  Neurological:     General: No focal deficit present.     Mental Status: He is alert and oriented to person, place, and time.  Psychiatric:        Mood and Affect: Mood normal.        Behavior: Behavior normal.        Thought Content: Thought content normal.        Judgment: Judgment normal.     The 10-year ASCVD risk score (Arnett DK, et al., 2019) is: 52.1%    Assessment & Plan:   Problem List Items Addressed This Visit  Cardiovascular and Mediastinum   Essential hypertension - Primary   Chronic, stable. His blood pressure is well-controlled with metoprolol  100mg  BID. Continue holding chlorthalidone . Check BMP today.       Relevant Orders   Basic metabolic panel with GFR   DM (diabetes mellitus) type II, controlled, with peripheral vascular disorder (HCC)   Chronic, ongoing. He has not recently checked his blood sugar but is taking medications as prescribed. Dexcom G7 was applied in office today. Sugar according to meter is 129. Continue Metformin  XR 500mg  BID. SGLT2 was too expensive. Check A1c today. Follow-up in 3 months.       Relevant Orders   Basic metabolic panel with GFR   Hemoglobin A1c     Other   Dyspnea on exertion   He reports fatigue and shortness of breath with exertion, without chest pain or significant leg  swelling. A referral to a cardiologist is planned. EKG shows normal sinus rhythm with a right bundle brand block and heart rate of 83, no ST or T wave changes, similar to prior EKG. Check BMP, BNP today.       Relevant Orders   Ambulatory referral to Cardiology   EKG 12-Lead (Completed)   B Nat Peptide   Chronic nonintractable headache   He experiences intermittent headaches with variable localization. An MRI showed mild microvascular changes, no signs of metastases. The differential diagnosis includes tension, sinus, and radiating neck pain. Aleve  is effective. Continue Aleve  as needed.      Other Visit Diagnoses       Tinea cruris       Continue the antifungal cream, stop hyrdocortisone and apply baby powder to keep the area dry.       Return in about 3 months (around 02/06/2025) for Diabetes.    Tinnie DELENA Harada, NP

## 2024-11-06 NOTE — Progress Notes (Signed)
 EKG interpreted by me on 11/06/24 showed normal sinus rhythm with a right bundle branch block, heart rate 83. No ST or T wave changes, similar to prior EKG.

## 2024-11-06 NOTE — Patient Instructions (Signed)
 It was great to see you!  Talk to the urologist about your medications  We are checking your labs today and will let you know the results via mychart/phone.   I have placed a referral to cardiology - they will call to schedule   Keep not taking the chlorthalidone  - your blood pressure is good   Keep using the athlete's foot cream. You can also put some baby powder on the area as well   Let's follow-up in 3 months, sooner if you have concerns.  If a referral was placed today, you will be contacted for an appointment. Please note that routine referrals can sometimes take up to 3-4 weeks to process. Please call our office if you haven't heard anything after this time frame.  Take care,  Tinnie Harada, NP

## 2024-11-07 DIAGNOSIS — R0609 Other forms of dyspnea: Secondary | ICD-10-CM

## 2024-11-07 DIAGNOSIS — G8929 Other chronic pain: Secondary | ICD-10-CM

## 2024-11-07 HISTORY — DX: Other forms of dyspnea: R06.09

## 2024-11-07 HISTORY — DX: Other chronic pain: G89.29

## 2024-11-07 LAB — BASIC METABOLIC PANEL WITH GFR
BUN: 19 mg/dL (ref 6–23)
CO2: 24 meq/L (ref 19–32)
Calcium: 9.6 mg/dL (ref 8.4–10.5)
Chloride: 104 meq/L (ref 96–112)
Creatinine, Ser: 1.05 mg/dL (ref 0.40–1.50)
GFR: 68.51 mL/min (ref >60.00)
Glucose, Bld: 146 mg/dL — ABNORMAL HIGH (ref 70–99)
Potassium: 4.1 meq/L (ref 3.5–5.1)
Sodium: 137 meq/L (ref 135–145)

## 2024-11-07 LAB — BRAIN NATRIURETIC PEPTIDE: Pro B Natriuretic peptide (BNP): 47 pg/mL (ref 0.0–100.0)

## 2024-11-07 LAB — HEMOGLOBIN A1C: Hgb A1c MFr Bld: 8.1 % — ABNORMAL HIGH (ref 4.6–6.5)

## 2024-11-07 NOTE — Assessment & Plan Note (Signed)
 Chronic, ongoing. He has not recently checked his blood sugar but is taking medications as prescribed. Dexcom G7 was applied in office today. Sugar according to meter is 129. Continue Metformin  XR 500mg  BID. SGLT2 was too expensive. Check A1c today. Follow-up in 3 months.

## 2024-11-07 NOTE — Assessment & Plan Note (Signed)
 Chronic, stable. His blood pressure is well-controlled with metoprolol  100mg  BID. Continue holding chlorthalidone . Check BMP today.

## 2024-11-07 NOTE — Assessment & Plan Note (Signed)
 He experiences intermittent headaches with variable localization. An MRI showed mild microvascular changes, no signs of metastases. The differential diagnosis includes tension, sinus, and radiating neck pain. Aleve  is effective. Continue Aleve  as needed.

## 2024-11-07 NOTE — Assessment & Plan Note (Signed)
 He reports fatigue and shortness of breath with exertion, without chest pain or significant leg swelling. A referral to a cardiologist is planned. EKG shows normal sinus rhythm with a right bundle brand block and heart rate of 83, no ST or T wave changes, similar to prior EKG. Check BMP, BNP today.

## 2024-11-08 ENCOUNTER — Ambulatory Visit (INDEPENDENT_AMBULATORY_CARE_PROVIDER_SITE_OTHER): Admitting: Urology

## 2024-11-08 ENCOUNTER — Encounter: Payer: Self-pay | Admitting: Urology

## 2024-11-08 VITALS — BP 146/70 | HR 78 | Ht 69.0 in | Wt 200.0 lb

## 2024-11-08 DIAGNOSIS — N3281 Overactive bladder: Secondary | ICD-10-CM

## 2024-11-08 DIAGNOSIS — C61 Malignant neoplasm of prostate: Secondary | ICD-10-CM

## 2024-11-08 DIAGNOSIS — N138 Other obstructive and reflux uropathy: Secondary | ICD-10-CM | POA: Diagnosis not present

## 2024-11-08 DIAGNOSIS — Z79818 Long term (current) use of other agents affecting estrogen receptors and estrogen levels: Secondary | ICD-10-CM | POA: Diagnosis not present

## 2024-11-08 DIAGNOSIS — Z87440 Personal history of urinary (tract) infections: Secondary | ICD-10-CM

## 2024-11-08 DIAGNOSIS — N401 Enlarged prostate with lower urinary tract symptoms: Secondary | ICD-10-CM | POA: Diagnosis not present

## 2024-11-08 LAB — URINALYSIS, ROUTINE W REFLEX MICROSCOPIC
Bilirubin, UA: NEGATIVE
Glucose, UA: NEGATIVE
Leukocytes,UA: NEGATIVE
Nitrite, UA: NEGATIVE
RBC, UA: NEGATIVE
Specific Gravity, UA: 1.02 (ref 1.005–1.030)
Urobilinogen, Ur: 0.2 mg/dL (ref 0.2–1.0)
pH, UA: 6 (ref 5.0–7.5)

## 2024-11-08 LAB — BLADDER SCAN AMB NON-IMAGING

## 2024-11-08 MED ORDER — ALFUZOSIN HCL ER 10 MG PO TB24
10.0000 mg | ORAL_TABLET | Freq: Every day | ORAL | 3 refills | Status: AC
Start: 2024-11-08 — End: ?

## 2024-11-08 MED ORDER — LEUPROLIDE ACETATE (6 MONTH) 45 MG ~~LOC~~ KIT
45.0000 mg | PACK | Freq: Once | SUBCUTANEOUS | Status: AC
  Administered 2024-11-08: 45 mg via SUBCUTANEOUS

## 2024-11-08 MED ORDER — OXYBUTYNIN CHLORIDE ER 10 MG PO TB24: 10.0000 mg | ORAL_TABLET | Freq: Every day | ORAL | 3 refills | Status: DC

## 2024-11-08 MED ORDER — OXYBUTYNIN CHLORIDE ER 10 MG PO TB24: 10.0000 mg | ORAL_TABLET | Freq: Every day | ORAL | 11 refills | Status: DC

## 2024-11-08 NOTE — Telephone Encounter (Signed)
 Copied from CRM [id number]. Topic: Clinical - Lab/Test Results >> Nov 08, 2024 12:20 PM Lonell PEDLAR wrote: Reason for CRM: Patient returned call regarding lab results [phone]

## 2024-11-08 NOTE — Progress Notes (Signed)
 Assessment: 1. 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 Encounter for monitoring androgen deprivation therapy   3. BPH with obstruction/lower urinary tract symptoms   4. History of UTI     Plan: PSA today Continue alfuzosin  10 mg daily Continue oxybutynin  ER 10 mg qHS Continue LT ADT with 6 month Eligard  - next dose due after 05/07/25.  Plan for 18-24 months of ADT (started 10/24) Continue daily calcium  and vit D supplement while on ADT to reduce risk of osteoporosis Return to office in 6 months - after 05/07/25 for next Eligard  injection and PSA  Chief Complaint:  Chief Complaint  Patient presents with   Prostate Cancer    History of Present Illness:  Richard Harding is a 78 y.o. male who is seen for further evaluation of high risk prostate cancer, BPH with obstruction, and history of  UTI. PSA results: 9/08 3.52 10/13 8.08 1/14 6.54 3/17 9.05 4/17 9.15 3/24 28.45  He has previously been evaluated for an elevated PSA by Dr. Amiel in 2008 and again by Dr. Nieves in November 2013. No prior prostate biopsy.  No family history of prostate cancer. He reported lower urinary tract symptoms including frequency, occasional urgency, and nocturia x 2.  He was not having any incontinence.  No dysuria or gross hematuria. IPSS = 16. PVR = 181 ml.  He also reports a history of erectile dysfunction and decreased libido.  He has previously used Viagra and Cialis with adequate results.  He is inquiring about testosterone  replacement therapy. Testosterone  level from 7/23 was 271.  Urine culture from 3/24 grew >100 K staph epidermidis.  He was treated with Bactrim  for 7 days. He was also started on tamsulosin  0.4 mg daily for his BPH with obstructive symptoms.  At his visit in April 2024, he had completed his antibiotics.  He continued on tamsulosin .  He reported improvement in his urinary symptoms with the tamsulosin  with decreased frequency,  urgency, and nocturia.  He felt like he was emptying his bladder more completely.  No dysuria or gross hematuria. IPSS = 10.  His PSA in June 2024 had increased to 30.7.  He underwent evaluation with a prostate MRI which showed a PI-RADS 5 lesion in the left anterior transition zone and a PI-RADS 3 lesion in the left posterior lateral peripheral zone.  Prostate volume measured 88.56 cm.  He underwent a fusion biopsy on 09/02/2023.  Pathology showed Gleason 4+5 = 9 in 40% of 1 of 2 cores from the left region of interest.  Repeat PSA prior to his biopsy had increased to 97.2. PSMA PET scan obtained on 09/27/2023 showed intense radiotracer activity in the left lobe of the prostate and no evidence of metastatic disease. He was started on androgen deprivation therapy for management of his very high risk prostate cancer with Firmagon  on 10/06/23. He reported some lower abdominal pain.  He continued on tamsulosin .  His lower urinary tract symptoms were stable.  No dysuria or gross hematuria. IPSS = 11.  Urine culture from 10/06/2023 grew >100 K E. coli.  He was treated with cefdinir  x 7 days. He has seen Dr. Patrcia with radiation oncology and has elected to proceed with external beam radiation for his prostate cancer. He received a 6 month Eligard  injection on 11/14/23 for continuation of his long-term ADT. He has tolerated ADT well.  He does have occasional hot flashes. He continued on tamsulosin  for his urinary symptoms.  He continued with symptoms of frequency, urgency, and incomplete emptying. IPSS = 22. Urine culture from 11/24: 10-25K E. coli.  Treated with cefdinir  x 7 days and started on daily Macrodantin .  He underwent placement of fiducial markers and SpaceOAR on 11/23/2023. He completed radiation therapy on 02/04/2024. He received a 40-month Eligard  injection on 05/10/2024.  Post treatment PSA: 5/25 <0.1 8/25 0.06  He noted some increased lower urinary tract symptoms associated with  radiation therapy.  He reports increased frequency, urgency, and nocturia.  He was started on Solifenacin .  No dysuria or gross hematuria.  He continued on tamsulosin  and daily Macrodantin .  At his visit in May 2025, he continued to have lower urinary tract symptoms with frequency, urgency, and nocturia.  He continued on tamsulosin  and solifenacin  5 mg.  No dysuria or gross hematuria. IPSS = 22/4. PVR = 174 ml. He reported decreased energy and hot flashes. No recent UTI symptoms.   He was changed to alfuzosin  and given a trial of Gemtesa . The solifenacin  was discontinued due to elevated PVR.  His visit in June 2025, he continued on alfuzosin .  He noted improvement with the Gemtesa  but ran out of samples.  He continued to have some frequency and nocturia.  No dysuria or gross hematuria. IPSS = 14/2. He was given a trial of Myrbetriq .  He is not sure if he took the medication. He was recently started on oxybutynin  10 mg nightly by his PCP.   He returns today for follow-up.  He is currently on alfuzosin  and oxybutynin  ER 10 mg.  He reports improvement in his frequency and urgency.  He is not having any significant side effects.  No dry mouth or constipation.  He feels like he is emptying his bladder well.  Portions of the above documentation were copied from a prior visit for review purposes only.   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   Type 2 diabetes mellitus (HCC)    followed by pcp   (11-10-2023  pt stated does not check blood sugar)    Wears glasses     Past Surgical History:  Past Surgical History:  Procedure Laterality Date   ANTERIOR FUSION CERVICAL SPINE  1990   Thinks it was C-3-4  done here at [facility]   BILATERAL CARPAL TUNNEL RELEASE Bilateral    1990s   COLONOSCOPY  2011   EXCISIONAL TOTAL KNEE ARTHROPLASTY WITH ANTIBIOTIC SPACERS Left 03/24/2024   Procedure: LEFT KNEE SYNOVECTOMY, IRRIGATION AND DEBRIDEMENT WITH POLY EXCHANGE;  Surgeon: Vernetta Lonni GRADE, MD;  Location: [facility];  Service: Orthopedics;  Laterality: Left;   GOLD SEED IMPLANT N/A 11/23/2023   Procedure: GOLD SEED IMPLANT;  Surgeon: Roseann Adine PARAS., MD;  Location: [facility];  Service: Urology;  Laterality: N/A;   SPACE OAR INSTILLATION N/A 11/23/2023   Procedure: SPACE OAR INSTILLATION;  Surgeon: Roseann Adine PARAS., MD;  Location: [facility];  Service: Urology;  Laterality: N/A;   TOTAL KNEE  ARTHROPLASTY Right 04/26/2020   Procedure: RIGHT TOTAL KNEE ARTHROPLASTY;  Surgeon: Vernetta Lonni GRADE, MD;  Location: [facility];  Service: Orthopedics;  Laterality: Right;   TOTAL KNEE ARTHROPLASTY Left 09/25/2022   Procedure: LEFT TOTAL KNEE ARTHROPLASTY;  Surgeon: Vernetta Lonni GRADE, MD;  Location: [facility];  Service: Orthopedics;  Laterality: Left;    Allergies:  Allergies  Allergen Reactions   Cinnamon Flavoring Agent (Non-Screening) Other (See Comments)    Cinnamon chewing gum caused blisters on tongue    Family History:  History reviewed. No pertinent family history.  Social History:  Social History   Tobacco Use   Smoking status: Never   Smokeless tobacco: Never  Vaping Use   Vaping status: Never Used  Substance Use Topics   Alcohol use: No   Drug use: Never    ROS: Constitutional:  Negative for fever, chills, weight loss CV: Negative for chest pain, previous MI, hypertension Respiratory:  Negative for shortness of breath, wheezing, sleep apnea, frequent cough GI:  Negative for nausea,  vomiting, bloody stool, GERD  Physical exam: BP (!) 146/70   Pulse 78   Ht 5' 9 (1.753 m)   Wt 200 lb (90.7 kg)   BMI 29.53 kg/m  GENERAL APPEARANCE:  Well appearing, well developed, well nourished, NAD HEENT:  Atraumatic, normocephalic, oropharynx clear NECK:  Supple without lymphadenopathy or thyromegaly ABDOMEN:  Soft, non-tender, no masses EXTREMITIES:  Moves all extremities well, without clubbing, cyanosis, or edema NEUROLOGIC:  Alert and oriented x 3, normal gait, CN II-XII grossly intact MENTAL STATUS:  appropriate BACK:  Non-tender to palpation, No CVAT SKIN:  Warm, dry, and intact  Results: U/A: negative  PVR = 89 ml

## 2024-11-08 NOTE — Progress Notes (Signed)
 Eligard  SubQ Injection   Due to Prostate Cancer patient is present today for a Eligard  Injection.  Medication: Eligard  6 month Dose: 45 mg  Location: right upper outer buttocks NDC: 37064-538-49 Lot: [id number] Exp: 12/19/25  Patient tolerated well, no complications were noted  Performed by: JAYSON Mainland CMA  PA approval dates:  11/04/23 through 11/24/24.  Auth # [id number]

## 2024-11-09 ENCOUNTER — Ambulatory Visit: Payer: Self-pay | Admitting: Urology

## 2024-11-09 LAB — PSA: Prostate Specific Ag, Serum: <0.1 ng/mL (ref 0.0–4.0)

## 2024-11-10 ENCOUNTER — Other Ambulatory Visit: Payer: Self-pay | Admitting: Nurse Practitioner

## 2024-11-10 NOTE — Telephone Encounter (Signed)
 Requesting: Fenofibrate  145 MG Oral Tablet , Potassium Chloride  Crys ER 20 MEQ Oral Tablet Extended Release  Last Visit: 11/06/2024 Next Visit: 02/07/2025 Last Refill: 05/17/2024,   Please Advise

## 2024-11-24 ENCOUNTER — Other Ambulatory Visit: Payer: Self-pay | Admitting: Urology

## 2024-11-24 ENCOUNTER — Telehealth: Payer: Self-pay | Admitting: Urology

## 2024-11-24 ENCOUNTER — Encounter: Payer: Self-pay | Admitting: Urology

## 2024-11-24 DIAGNOSIS — N3281 Overactive bladder: Secondary | ICD-10-CM

## 2024-11-24 MED ORDER — TROSPIUM CHLORIDE 20 MG PO TABS: 20.0000 mg | ORAL_TABLET | Freq: Two times a day (BID) | ORAL | 11 refills | Status: AC

## 2024-11-24 NOTE — Telephone Encounter (Signed)
 Notified patient as advised, patient verbalized understanding with repeat back.

## 2024-11-24 NOTE — Telephone Encounter (Signed)
 Richard Harding called today and stated that he is still having problems with going to the bathroom a lot during the day. He said that the Alfuzosin  is not helping and wanted to know if there is something else he can try. He uses the [facility] on [address]. He would like a call back to confirm.  Thanks, Rosaline

## 2024-11-27 ENCOUNTER — Ambulatory Visit: Payer: Self-pay

## 2024-11-27 ENCOUNTER — Telehealth: Payer: Self-pay | Admitting: Nurse Practitioner

## 2024-11-27 NOTE — Telephone Encounter (Unsigned)
 Copied from CRM #[id number]. Topic: Clinical - Refused Triage >> Nov 27, 2024 10:11 AM Drema MATSU wrote: Patient/caller voiced complaints of Patient has constant pain in his leg and it affects his walk a little bit. He will be traveling for Christmas and New Years. He is requesting medication for the pain. . Declined transfer to triage.   ----------------------------------------------------------------------- From previous Reason for Contact - Scheduling: Patient/patient representative is calling to schedule an appointment. Refer to attachments for appointment information.

## 2024-11-27 NOTE — Telephone Encounter (Signed)
 Copied from CRM #[id number]. Topic: Clinical - Refused Triage >> Nov 27, 2024 10:11 AM Drema MATSU wrote: Patient/caller voiced complaints of Patient has constant pain in his leg and it affects his walk a little bit. He will be traveling for Christmas and New Years. He is requesting medication for the pain. . Declined transfer to triage.   ----------------------------------------------------------------------- From previous Reason for Contact - Scheduling: Patient/patient representative is calling to schedule an appointment. Refer to attachments for appointment information.

## 2024-11-27 NOTE — Telephone Encounter (Signed)
 FYI Only or Action Required?: FYI only for provider: appointment scheduled on 11/28/2024.  Patient was last seen in primary care on 11/06/2024 by Nedra Tinnie LABOR, NP.  Called Nurse Triage reporting Leg Pain.  Symptoms began several weeks ago.  Interventions attempted: Nothing.  Symptoms are: gradually worsening.  Triage Disposition: See PCP When Office is Open (Within 3 Days)  Patient/caregiver understands and will follow disposition?: Yes  Copied from CRM #[id number]. Topic: Clinical - Refused Triage >> Nov 27, 2024 10:11 AM Drema MATSU wrote: Patient/caller voiced complaints of Patient has constant pain in his leg and it affects his walk a little bit. He will be traveling for Christmas and New Years. He is requesting medication for the pain. . Declined transfer to triage.   Reason for Disposition  [1] MODERATE pain (e.g., interferes with normal activities, limping) AND [2] present > 3 days  Answer Assessment - Initial Assessment Questions 1. ONSET: When did the pain start?      One week ago 2. LOCATION: Where is the pain located?      Left leg pain behind knee 3. PAIN: How bad is the pain?    (Scale 1-10; or mild, moderate, severe)     4/10 4. WORK OR EXERCISE: Has there been any recent work or exercise that involved this part of the body?      denies 5. CAUSE: What do you think is causing the leg pain?     Unknown, has always had pain with this leg 6. OTHER SYMPTOMS: Do you have any other symptoms? (e.g., chest pain, back pain, breathing difficulty, swelling, rash, fever, numbness, weakness)     denies  Protocols used: Leg Pain-A-AH

## 2024-11-28 ENCOUNTER — Ambulatory Visit (HOSPITAL_COMMUNITY)
Admission: RE | Admit: 2024-11-28 | Discharge: 2024-11-28 | Disposition: A | Source: Ambulatory Visit | Attending: Nurse Practitioner

## 2024-11-28 ENCOUNTER — Ambulatory Visit: Payer: Self-pay | Admitting: Nurse Practitioner

## 2024-11-28 ENCOUNTER — Ambulatory Visit: Admitting: Nurse Practitioner

## 2024-11-28 ENCOUNTER — Encounter: Payer: Self-pay | Admitting: Nurse Practitioner

## 2024-11-28 VITALS — BP 132/78 | HR 76 | Temp 98.4°F | Ht 69.0 in | Wt 207.2 lb

## 2024-11-28 DIAGNOSIS — Z973 Presence of spectacles and contact lenses: Secondary | ICD-10-CM | POA: Insufficient documentation

## 2024-11-28 DIAGNOSIS — G8929 Other chronic pain: Secondary | ICD-10-CM | POA: Insufficient documentation

## 2024-11-28 DIAGNOSIS — M1990 Unspecified osteoarthritis, unspecified site: Secondary | ICD-10-CM | POA: Insufficient documentation

## 2024-11-28 DIAGNOSIS — A159 Respiratory tuberculosis unspecified: Secondary | ICD-10-CM | POA: Insufficient documentation

## 2024-11-28 DIAGNOSIS — M25562 Pain in left knee: Secondary | ICD-10-CM | POA: Diagnosis not present

## 2024-11-28 DIAGNOSIS — M5382 Other specified dorsopathies, cervical region: Secondary | ICD-10-CM | POA: Insufficient documentation

## 2024-11-28 NOTE — Assessment & Plan Note (Addendum)
 Acute on chronic knee pain. Location: lateral posterior region and radiates to upper calf region. Onset 70month ago. Denies any injury. Under the care of ortho due to previous joint infection post arthroplasty. Complete oral abx 70month ago. Last appointment with Dr. Vernetta 09/20/2024. Had x-ray: No acute finding per provider note. He declined arthrocentesis at the time. Today left knee with effusion and limited ROM (flexion and extension), no erythema or increased warmth noted. He endorses posterior knee pain and calf tenderness. LE edema also noted today. Order venous doppler. Advised to use a thigh high compression stocking Advised to take tylenol  or naproxen  for pain as directed on package. F/up with ortho if negative DVT

## 2024-11-28 NOTE — Progress Notes (Signed)
 Acute Office Visit  Subjective:    Patient ID: Richard Harding, male    DOB: [birth year], 78 y.o.   MRN: [id number]  Chief Complaint  Patient presents with   Knee Pain    Left knee pain behind knee for 1 month worse with movement- has a questions about the equate arthritis gel for pain Feels like something is in my throat intermittently for 1 month maybe 2 months     Sore Throat  This is a recurrent problem. The current episode started 1 to 4 weeks ago. The problem has been waxing and waning. The pain is mild. Associated symptoms include congestion. Pertinent negatives include no abdominal pain, coughing, diarrhea, drooling, ear discharge, ear pain, headaches, hoarse voice, plugged ear sensation, neck pain, shortness of breath, stridor, swollen glands, trouble swallowing or vomiting. Associated symptoms comments: No GERD symptoms. He has had no exposure to strep or mono. He has tried nothing for the symptoms.   Chronic pain of left knee Acute on chronic knee pain. Location: lateral posterior region and radiates to upper calf region. Onset 34month ago. Denies any injury. Under the care of ortho due to previous joint infection post arthroplasty. Complete oral abx 34month ago. Last appointment with Dr. Vernetta 09/20/2024. Had x-ray: No acute finding per provider note. He declined arthrocentesis at the time. Today left knee with effusion and limited ROM (flexion and extension), no erythema or increased warmth noted. He endorses posterior knee pain and calf tenderness. LE edema also noted today. Order venous doppler. Advised to use a thigh high compression stocking Advised to take tylenol  or naproxen  for pain as directed on package. F/up with ortho if negative DVT  Outpatient Medications Prior to Visit  Medication Sig   alfuzosin  (UROXATRAL ) 10 MG 24 hr tablet Take 1 tablet (10 mg total) by mouth daily with breakfast.   Ascorbic Acid  (VITAMIN C ) 1000 MG tablet Take 1,000 mg by mouth 2  (two) times daily.   aspirin  81 MG chewable tablet Chew 1 tablet (81 mg total) by mouth 2 (two) times daily.   atorvastatin  (LIPITOR) 10 MG tablet Take 1 tablet (10 mg total) by mouth daily.   b complex vitamins capsule Take 1 capsule by mouth daily.   cefadroxil  (DURICEF) 500 MG capsule Take 2 capsules (1,000 mg total) by mouth 2 (two) times daily.   diclofenac Sodium (VOLTAREN) 1 % GEL Apply 2 g topically as needed (pain).   fenofibrate  (TRICOR ) 145 MG tablet Take 1 tablet by mouth once daily   Flaxseed, Linseed, (FLAXSEED OIL) 1200 MG CAPS Take by mouth.   Ginkgo Biloba 60 MG TABS Take by mouth.   levocetirizine (XYZAL ) 5 MG tablet Take 1 tablet (5 mg total) by mouth every evening.   LUTEIN PO Take 1 capsule by mouth daily.   Maca 500 MG CAPS Take by mouth.   metFORMIN  (GLUCOPHAGE -XR) 500 MG 24 hr tablet TAKE 1 TABLET BY MOUTH TWICE DAILY WITH A MEAL   metoprolol  tartrate (LOPRESSOR ) 100 MG tablet Take 1 tablet by mouth twice daily   naproxen  sodium (ALEVE ) 220 MG tablet Take 440 mg by mouth 2 (two) times daily as needed.   omega-3 acid ethyl esters (LOVAZA ) 1 g capsule Take 2 capsules (2 g total) by mouth 2 (two) times daily.   OVER THE COUNTER MEDICATION Take 1 capsule by mouth daily. Mens Prostate formula supplement   potassium chloride  SA (KLOR-CON  M) 20 MEQ tablet Take 1 tablet by mouth once daily   rizatriptan  (  MAXALT ) 10 MG tablet 10 mg orally; may repeat after 2 hours, MAX 30 mg/24 hour   [place] WORT PO Take 1 tablet by mouth daily.   triamcinolone ointment (KENALOG) 0.1 % Apply 1 Application topically 2 (two) times daily as needed for rash.   trospium  (SANCTURA ) 20 MG tablet Take 1 tablet (20 mg total) by mouth 2 (two) times daily.   Turmeric 500 MG CAPS Take 500 mg by mouth daily.   No facility-administered medications prior to visit.    Reviewed past medical and social history.  Review of Systems  HENT:  Positive for congestion. Negative for drooling, ear discharge, ear  pain, hoarse voice and trouble swallowing.   Respiratory:  Negative for cough, shortness of breath and stridor.   Gastrointestinal:  Negative for abdominal pain, diarrhea and vomiting.  Musculoskeletal:  Negative for neck pain.  Neurological:  Negative for headaches.   Per HPI     Objective:    Physical Exam Vitals and nursing note reviewed.  HENT:     Mouth/Throat:     Mouth: Mucous membranes are moist.     Pharynx: Uvula midline. No oropharyngeal exudate, posterior oropharyngeal erythema, uvula swelling or postnasal drip.     Tonsils: No tonsillar exudate.  Neck:     Thyroid : No thyroid  mass, thyromegaly or thyroid  tenderness.  Musculoskeletal:     Left upper leg: Normal.     Left knee: Swelling, effusion and crepitus present. No erythema. Decreased range of motion. Tenderness present. No medial joint line, lateral joint line or patellar tendon tenderness.     Right lower leg: Normal.     Left lower leg: Edema present.  Lymphadenopathy:     Cervical: No cervical adenopathy.  Neurological:     Mental Status: He is alert.    BP 132/78 (BP Location: Left Arm, Patient Position: Sitting, Cuff Size: Large)   Pulse 76   Temp 98.4 F (36.9 C) (Oral)   Ht 5' 9 (1.753 m)   Wt 207 lb 3.2 oz (94 kg)   SpO2 96%   BMI 30.60 kg/m    No results found for any visits on 11/28/24.     Assessment & Plan:   Problem List Items Addressed This Visit     Chronic pain of left knee - Primary   Acute on chronic knee pain. Location: lateral posterior region and radiates to upper calf region. Onset 59month ago. Denies any injury. Under the care of ortho due to previous joint infection post arthroplasty. Complete oral abx 59month ago. Last appointment with Dr. Vernetta 09/20/2024. Had x-ray: No acute finding per provider note. He declined arthrocentesis at the time. Today left knee with effusion and limited ROM (flexion and extension), no erythema or increased warmth noted. He endorses  posterior knee pain and calf tenderness. LE edema also noted today. Order venous doppler. Advised to use a thigh high compression stocking Advised to take tylenol  or naproxen  for pain as directed on package. F/up with ortho if negative DVT      Relevant Orders   VAS US  LOWER EXTREMITY VENOUS (DVT)   No orders of the defined types were placed in this encounter.  No follow-ups on file.    Roselie Mood, NP

## 2024-11-28 NOTE — Telephone Encounter (Signed)
 Noted. Patient saw Roselie Mood, NP today, 11/28/24.

## 2024-11-28 NOTE — Patient Instructions (Addendum)
 You will be contacted to schedule an appointment for venous doppler Use thigh high compression stocking during the day and off at night. Ok to use tylenol  650mg  every 8hrs or naproxen  220mg  2tabs every 12hrs as needed for pain. Take with food

## 2024-11-28 NOTE — Telephone Encounter (Signed)
 Noted

## 2024-11-30 ENCOUNTER — Ambulatory Visit

## 2024-11-30 VITALS — BP 118/70 | HR 85 | Ht 69.0 in | Wt 205.8 lb

## 2024-11-30 DIAGNOSIS — E782 Mixed hyperlipidemia: Secondary | ICD-10-CM | POA: Diagnosis not present

## 2024-11-30 DIAGNOSIS — R0609 Other forms of dyspnea: Secondary | ICD-10-CM | POA: Diagnosis not present

## 2024-11-30 DIAGNOSIS — I10 Essential (primary) hypertension: Secondary | ICD-10-CM

## 2024-11-30 NOTE — Patient Instructions (Signed)
 Medication Instructions:  Your physician recommends that you continue on your current medications as directed. Please refer to the Current Medication list given to you today.  *If you need a refill on your cardiac medications before your next appointment, please call your pharmacy*  Lab Work: None If you have labs (blood work) drawn today and your tests are completely normal, you will receive your results only by: MyChart Message (if you have MyChart) OR A paper copy in the mail If you have any lab test that is abnormal or we need to change your treatment, we will call you to review the results.  Testing/Procedures: Your physician has requested that you have an echocardiogram. Echocardiography is a painless test that uses sound waves to create images of your heart. It provides your doctor with information about the size and shape of your heart and how well your hearts chambers and valves are working. This procedure takes approximately one hour. There are no restrictions for this procedure. Please do NOT wear cologne, perfume, aftershave, or lotions (deodorant is allowed). Please arrive 15 minutes prior to your appointment time.  Please note: We ask at that you not bring children with you during ultrasound (echo/ vascular) testing. Due to room size and safety concerns, children are not allowed in the ultrasound rooms during exams. Our front office staff cannot provide observation of children in our lobby area while testing is being conducted. An adult accompanying a patient to their appointment will only be allowed in the ultrasound room at the discretion of the ultrasound technician under special circumstances. We apologize for any inconvenience.     [facility] at [facility] [address] [place], [place] [address] Phone: [phone]    Please arrive 15 minutes prior to your appointment time for registration and insurance purposes.  The test will take  approximately 3 to 4 hours to complete; you may bring reading material.  If someone comes with you to your appointment, they will need to remain in the main lobby due to limited space in the testing area. **If you are pregnant or breastfeeding, please notify the nuclear lab prior to your appointment**  How to prepare for your Myocardial Perfusion Test: Do not eat or drink 3 hours prior to your test, except you may have water . Do not consume products containing caffeine (regular or decaffeinated) 12 hours prior to your test. (ex: coffee, chocolate, sodas, tea). Do bring a list of your current medications with you.  If not listed below, you may take your medications as normal. Do not take metoprolol  (Lopressor , Toprol ) for 24 hours prior to the test.  Bring the medication to your appointment as you may be required to take it once the test is complete.   HOLD diabetic medication/insulin  the morning of the test: Metformin  Do wear comfortable clothes (no dresses or overalls) and walking shoes, tennis shoes preferred (No heels or open toe shoes are allowed). Do NOT wear cologne, perfume, aftershave, or lotions (deodorant is allowed). If these instructions are not followed, your test will have to be rescheduled.  Please report to [address] ([facility] 2nd Floor, for your test.  If you have questions or concerns about your appointment, you can call the Nuclear Lab at [phone].  If you cannot keep your appointment, please provide 24 hours notification to the Nuclear Lab, to avoid a possible $50 charge to your account.   Follow-Up: At [facility], you and  your health needs are our priority.  As part of our continuing mission to provide you with exceptional heart care, our providers are all part of one team.  This team includes your primary Cardiologist (physician) and Advanced Practice Providers or APPs (Physician Assistants and Nurse  Practitioners) who all work together to provide you with the care you need, when you need it.  Your next appointment:   2 month(s)  Provider:   Alean Kobus, MD    We recommend signing up for the patient portal called MyChart.  Sign up information is provided on this After Visit Summary.  MyChart is used to connect with patients for Virtual Visits (Telemedicine).  Patients are able to view lab/test results, encounter notes, upcoming appointments, etc.  Non-urgent messages can be sent to your provider as well.   To learn more about what you can do with MyChart, go to [email or web address].   Other Instructions None

## 2024-11-30 NOTE — Progress Notes (Signed)
 Cardiology Consultation:    Date:  11/30/2024   ID:  D'Arcy, Abraha 1946-05-07, MRN [id number]  PCP:  Nedra Tinnie LABOR, NP  Cardiologist:  Alean JONELLE Kobus, MD   Referring MD: Nedra Tinnie LABOR, NP   No chief complaint on file.    ASSESSMENT AND PLAN:   Richard Harding 78 year old male history of diabetes mellitus, hypertension, hyperlipidemia, chronic headache, chronic anemia. Non-smoker.  Does not drink alcohol.  No illicit drug use. Denies any prior history of CAD, CHF, MI, CVA.   Here for further evaluation of symptoms of dyspnea on exertion and feeling tired and fatigued, progressive over the past 12 months.  Problem List Items Addressed This Visit       Cardiovascular and Mediastinum   Hypertension   Well-controlled. Currently on metoprolol  100 mg once daily. Will defer medications for blood pressure to PCP. High dose beta-blockers can contribute to fatigue and tiredness. If no significant echocardiogram and stress test abnormalities would recommend switching from high-dose beta-blockers to an alternative antihypertensive medication and taper and cut down the dose of beta-blockers.        Other   Hyperlipidemia, mixed   Elevated triglycerides. Advised to modification to target low fat diet. LDL at goal. Continue atorvastatin  10 mg once daily.       Dyspnea on exertion - Primary   Shortness of breath on exertion associated with a sense of fatigue. Progressive over the past 12 months.  Differential diagnosis is vast. Deconditioning versus anemia versus from cardiac standpoint cardiac structural and functional abnormalities versus obstructive CAD.  Versus related to ongoing treatment with high-dose beta-blocker metoprolol  tartrate 100 mg twice daily.  No physical clinical signs or symptoms of heart failure.  - Will evaluate with transthoracic echocardiogram - Will evaluate with Lexiscan stress with nuclear imaging to rule out ischemia.  Not a  candidate for treadmill EKG with baseline RBBB and ST-T changes. -If no significant abnormalities, would recommend titrating down his beta-blocker therapy.  Recommend further evaluation with PCP regarding anemia workup.      Relevant Orders   EKG 12-Lead (Completed)   Return to clinic tentatively in 2 months.    History of Present Illness:    Richard Harding is a 78 y.o. male who is being seen today for the evaluation of dyspnea on exertion at the request of McElwee, Lauren A, NP.  Pleasant man here for the visit by himself.  Originally from [place].  Lives by himself at home.  Has home caregiver that helps him out 4 hours a week with cooking and cleaning.  He drives himself around, although avoids driving at night and on highways. Has travel planned out for couple weeks to [place] and will be returning in the second week of January.  Has history of diabetes mellitus, hypertension, hyperlipidemia, chronic headache, chronic anemia. Non-smoker.  Does not drink alcohol.  No illicit drug use. Denies any prior history of CAD, CHF, MI, CVA.  Patient recently found out about a friend who was feeling tired and weak and subsequently underwent CABG.  He wanted to further get evaluated for his symptoms of tiredness and fatigue which he has been experiencing for the past 1 year gradually progressing. Denies any symptoms of shortness of breath or chest pain or discomfort at rest. With activity and exertion he tends to get out of breath easily and feels a lack of energy. Symptoms relieved with rest.  Denies any syncopal or near syncopal episodes. Denies any blood in  urine or stools.   EKG in the clinic today shows sinus rhythm with occasional PAC with aberrant conduction, PR interval 174 ms, right bundle branch block morphology QRS 144 ms duration  Venous duplex study 11/28/2024 no evidence of DVT.   blood work from 11/06/2024 shows proBNP normal 47. Hemoglobin A1c elevated 8.1. Sodium  137, potassium 4.1 BUN 99 creatinine 1.05, eGFR 68 Previous CBC for evaluation is from 08/18/2024 anemia hemoglobin 10.7, hematocrit 31.6, platelets 297 and WBC 4. Lipid panel from 08/18/2024 triglycerides elevated 235, HDL 35, LDL 67.  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   Type 2 diabetes mellitus (HCC)    followed by pcp   (11-10-2023  pt stated does not check blood sugar)   Unilateral primary osteoarthritis, left knee 04/08/2022   Weakness 05/17/2024   Wears glasses     Past Surgical History:  Procedure Laterality Date   ANTERIOR FUSION CERVICAL SPINE  1990   Thinks it was C-3-4  done here at [facility]   BILATERAL CARPAL TUNNEL RELEASE Bilateral    1990s   COLONOSCOPY  2011   EXCISIONAL TOTAL KNEE ARTHROPLASTY WITH ANTIBIOTIC SPACERS Left 03/24/2024   Procedure: LEFT KNEE SYNOVECTOMY,  IRRIGATION AND DEBRIDEMENT WITH POLY EXCHANGE;  Surgeon: Vernetta Lonni GRADE, MD;  Location: [facility];  Service: Orthopedics;  Laterality: Left;   GOLD SEED IMPLANT N/A 11/23/2023   Procedure: GOLD SEED IMPLANT;  Surgeon: Roseann Adine PARAS., MD;  Location[facility];  Service: Urology;  Laterality: N/A;   SPACE OAR INSTILLATION N/A 11/23/2023   Procedure: SPACE OAR INSTILLATION;  Surgeon: Roseann Adine PARAS., MD;  Location: [facility];  Service: Urology;  Laterality: N/A;   TOTAL KNEE ARTHROPLASTY Right 04/26/2020   Procedure: RIGHT TOTAL KNEE ARTHROPLASTY;  Surgeon: Vernetta Lonni GRADE, MD;  Location: [facility];  Service: Orthopedics;  Laterality: Right;   TOTAL KNEE ARTHROPLASTY Left 09/25/2022   Procedure: LEFT TOTAL KNEE ARTHROPLASTY;  Surgeon: Vernetta Lonni GRADE, MD;  Location: [facility];  Service: Orthopedics;  Laterality: Left;    Current Medications: Active Medications[1]   Allergies:   Cinnamon flavoring agent (non-screening)   Social History   Socioeconomic History   Marital status: Single    Spouse name: Not on file   Number of children: 0   Years of education: Not on file  Highest education level: Not on file  Occupational History   Occupation: retired  Tobacco Use   Smoking status: Never   Smokeless tobacco: Never  Vaping Use   Vaping status: Never Used  Substance and Sexual Activity   Alcohol use: No   Drug use: Never   Sexual activity: Not Currently  Other Topics Concern   Not on file  Social History Narrative   Single - lives alone - no children - no family nearby   Social Drivers of Health   Tobacco Use: Low Risk (11/30/2024)   Patient History    Smoking Tobacco Use: Never    Smokeless Tobacco Use: Never    Passive Exposure: Not on file  Financial Resource Strain: Low Risk (07/19/2023)   Overall Financial Resource Strain (CARDIA)    Difficulty of Paying Living Expenses: Not hard at all  Food Insecurity: Food  Insecurity Present (03/24/2024)   Hunger Vital Sign    Worried About Running Out of Food in the Last Year: Sometimes true    Ran Out of Food in the Last Year: Sometimes true  Transportation Needs: No Transportation Needs (03/24/2024)   PRAPARE - Administrator, Civil Service (Medical): No    Lack of Transportation (Non-Medical): No  Physical Activity: Inactive (07/19/2023)   Exercise Vital Sign    Days of Exercise per Week: 0 days    Minutes of Exercise per Session: 0 min  Stress: No Stress Concern Present (07/19/2023)   [employer] of Occupational Health - Occupational Stress Questionnaire    Feeling of Stress : Only a little  Social Connections: Socially Isolated (03/24/2024)   Social Connection and Isolation Panel    Frequency of Communication with Friends and Family: More than three times a week    Frequency of Social Gatherings with Friends and Family: Once a week    Attends Religious Services: Never    Database Administrator or Organizations: No    Attends Banker Meetings: Never    Marital Status: Divorced  Depression (PHQ2-9): Medium Risk (11/06/2024)   Depression (PHQ2-9)    PHQ-2 Score: 6  Alcohol Screen: Low Risk (10/22/2023)   Alcohol Screen    Last Alcohol Screening Score (AUDIT): 0  Housing: Low Risk (03/24/2024)   Housing Stability Vital Sign    Unable to Pay for Housing in the Last Year: No    Number of Times Moved in the Last Year: 0    Homeless in the Last Year: No  Utilities: Not At Risk (03/24/2024)   AHC Utilities    Threatened with loss of utilities: No  Health Literacy: Adequate Health Literacy (07/19/2023)   B1300 Health Literacy    Frequency of need for help with medical instructions: Never     Family History: The patient's family history is not on file. ROS:   Please see the history of present illness.    All 14 point review of systems negative except as described per history of present illness.  EKGs/Labs/Other Studies  Reviewed:    The following studies were reviewed today:   EKG:  EKG Interpretation Date/Time:  Thursday November 30 2024 15:49:01 EST Ventricular Rate:  85 PR Interval:  174 QRS Duration:  144 QT Interval:  430 QTC Calculation: 511 R Axis:   -18  Text Interpretation: Sinus rhythm with Premature atrial complexes with Abberant conduction Right bundle branch block When compared with ECG of 23-Nov-2023 12:04, Abberant conduction is now Present T wave inversion less evident in  Anterolateral leads Confirmed by Liborio Hai reddy [id number]) on 11/30/2024 3:55:42 PM    Recent Labs: 05/17/2024: TSH 4.93 08/18/2024: ALT 21; Hemoglobin 10.7; Platelets 297.0 11/06/2024: BUN 19; Creatinine, Ser 1.05; Potassium 4.1; Pro B Natriuretic peptide (BNP) 47.0; Sodium 137  Recent Lipid Panel    Component Value Date/Time   CHOL 150 08/18/2024 0941   TRIG 235.0 (H) 08/18/2024 0941   HDL 35.40 (L) 08/18/2024 0941   CHOLHDL 4 08/18/2024 0941   VLDL 47.0 (H) 08/18/2024 0941   LDLCALC 67 08/18/2024 0941   LDLCALC 76 03/26/2022 1430   LDLDIRECT 81.0 02/22/2023 1536    Physical Exam:    VS:  BP 118/70   Pulse 85   Ht 5' 9 (1.753 m)   Wt 205 lb 12.8 oz (93.4 kg)   SpO2 97%   BMI 30.39 kg/m     Wt Readings from Last 3 Encounters:  11/30/24 205 lb 12.8 oz (93.4 kg)  11/28/24 207 lb 3.2 oz (94 kg)  11/08/24 200 lb (90.7 kg)     GENERAL:  Well nourished, well developed in no acute distress NECK: No JVD; No carotid bruits CARDIAC: RRR, S1 and S2 present, no murmurs, no rubs, no gallops CHEST:  Clear to auscultation without rales, wheezing or rhonchi  Extremities: No pitting pedal edema. Pulses bilaterally symmetric with radial 2+ and dorsalis pedis 2+ NEUROLOGIC:  Alert and oriented x 3  Medication Adjustments/Labs and Tests Ordered: Current medicines are reviewed at length with the patient today.  Concerns regarding medicines are outlined above.  Orders Placed This Encounter  Procedures    EKG 12-Lead   No orders of the defined types were placed in this encounter.   Signed, Ernestine Langworthy reddy Michalla Ringer, MD, MPH, [facility]. 11/30/2024 4:19 PM    [facility]     [1]  Current Meds  Medication Sig   alfuzosin  (UROXATRAL ) 10 MG 24 hr tablet Take 1 tablet (10 mg total) by mouth daily with breakfast.   Ascorbic Acid  (VITAMIN C ) 1000 MG tablet Take 1,000 mg by mouth 2 (two) times daily.   aspirin  81 MG chewable tablet Chew 1 tablet (81 mg total) by mouth 2 (two) times daily.   atorvastatin  (LIPITOR) 10 MG tablet Take 1 tablet (10 mg total) by mouth daily.   b complex vitamins capsule Take 1 capsule by mouth daily.   cefadroxil  (DURICEF) 500 MG capsule Take 2 capsules (1,000 mg total) by mouth 2 (two) times daily.   diclofenac Sodium (VOLTAREN) 1 % GEL Apply 2 g topically as needed (pain).   fenofibrate  (TRICOR ) 145 MG tablet Take 1 tablet by mouth once daily   Flaxseed, Linseed, (FLAXSEED OIL) 1200 MG CAPS Take by mouth.   Ginkgo Biloba 60 MG TABS Take by mouth.   levocetirizine (XYZAL ) 5 MG tablet Take 1 tablet (5 mg total) by mouth every evening.   LUTEIN PO Take 1 capsule by mouth daily.   Maca 500 MG CAPS Take by mouth.   metFORMIN  (GLUCOPHAGE -XR) 500 MG 24 hr tablet TAKE 1 TABLET BY MOUTH TWICE DAILY WITH A MEAL   metoprolol  tartrate (LOPRESSOR ) 100 MG tablet Take 1 tablet by mouth twice daily   naproxen  sodium (ALEVE ) 220 MG tablet Take 440 mg by mouth 2 (two) times daily as needed.   omega-3 acid ethyl esters (LOVAZA ) 1 g capsule Take 2 capsules (2 g total) by mouth 2 (two) times daily.   OVER THE COUNTER MEDICATION Take 1 capsule by mouth daily. Mens  Prostate formula supplement   potassium chloride  SA (KLOR-CON  M) 20 MEQ tablet Take 1 tablet by mouth once daily   rizatriptan  (MAXALT ) 10 MG tablet 10 mg orally; may repeat after 2 hours, MAX 30 mg/24 hour   ST JOHNS WORT PO Take 1 tablet by mouth daily.   triamcinolone ointment (KENALOG) 0.1 % Apply 1  Application topically 2 (two) times daily as needed for rash.   trospium  (SANCTURA ) 20 MG tablet Take 1 tablet (20 mg total) by mouth 2 (two) times daily.   Turmeric 500 MG CAPS Take 500 mg by mouth daily.

## 2024-11-30 NOTE — Assessment & Plan Note (Addendum)
 Shortness of breath on exertion associated with a sense of fatigue. Progressive over the past 12 months.  Differential diagnosis is vast. Deconditioning versus anemia versus from cardiac standpoint cardiac structural and functional abnormalities versus obstructive CAD.  Versus related to ongoing treatment with high-dose beta-blocker metoprolol  tartrate 100 mg twice daily.  No physical clinical signs or symptoms of heart failure.  - Will evaluate with transthoracic echocardiogram - Will evaluate with Lexiscan stress with nuclear imaging to rule out ischemia.  Not a candidate for treadmill EKG with baseline RBBB and ST-T changes. -If no significant abnormalities, would recommend titrating down his beta-blocker therapy.  Recommend further evaluation with PCP regarding anemia workup.

## 2024-11-30 NOTE — Assessment & Plan Note (Signed)
 Well-controlled. Currently on metoprolol  100 mg once daily. Will defer medications for blood pressure to PCP. High dose beta-blockers can contribute to fatigue and tiredness. If no significant echocardiogram and stress test abnormalities would recommend switching from high-dose beta-blockers to an alternative antihypertensive medication and taper and cut down the dose of beta-blockers.

## 2024-11-30 NOTE — Assessment & Plan Note (Signed)
 Elevated triglycerides. Advised to modification to target low fat diet. LDL at goal. Continue atorvastatin  10 mg once daily.

## 2024-12-25 ENCOUNTER — Ambulatory Visit: Admitting: Orthopaedic Surgery

## 2024-12-25 ENCOUNTER — Encounter: Payer: Self-pay | Admitting: Orthopaedic Surgery

## 2024-12-25 DIAGNOSIS — Z96652 Presence of left artificial knee joint: Secondary | ICD-10-CM | POA: Diagnosis not present

## 2024-12-25 NOTE — Progress Notes (Signed)
 The patient is a 79 year old well-known to us .  We have replaced both of his knees.  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  He had an incision and drainage/irrigation debridement and poly exchange.  He was on a long course of antibiotics afterwards.  Still reports pain and swelling with the knee.  He is feels like it is not infected and that he just needs therapy.  He is taking a lot of vitamin C  as well.  On exam the left knee is swollen but there is no redness.  The incision is healed nicely.  I am still concerned that there is residual infection in the knee.  However he would like to at least try some physical therapy first.  His knee does hurt globally.  He denies any fever or chills.  He would like to have outpatient physical therapy at [facility] as outpatient facility at [place] so we will work on getting that ordered and then we will see him back in about 6 weeks.  At that visit we will likely order new labs and continue to recommend an aspiration of his left knee.  No x-rays are needed at the next visit.

## 2024-12-26 ENCOUNTER — Other Ambulatory Visit: Payer: Self-pay

## 2024-12-26 DIAGNOSIS — I10 Essential (primary) hypertension: Secondary | ICD-10-CM

## 2024-12-26 DIAGNOSIS — R0609 Other forms of dyspnea: Secondary | ICD-10-CM

## 2024-12-26 DIAGNOSIS — E782 Mixed hyperlipidemia: Secondary | ICD-10-CM

## 2024-12-27 ENCOUNTER — Other Ambulatory Visit: Payer: Self-pay | Admitting: Nurse Practitioner

## 2024-12-27 DIAGNOSIS — E1151 Type 2 diabetes mellitus with diabetic peripheral angiopathy without gangrene: Secondary | ICD-10-CM

## 2024-12-27 NOTE — Telephone Encounter (Signed)
 Requesting: metFORMIN  HCl ER 500 MG Oral Tablet Extended Release 24 Hour  Last Visit: 11/06/2024 Next Visit: 02/07/2025 Last Refill: 06/29/2024  Please Advise

## 2025-01-11 ENCOUNTER — Other Ambulatory Visit: Payer: Self-pay

## 2025-01-11 ENCOUNTER — Encounter: Payer: Self-pay | Admitting: Physical Therapy

## 2025-01-11 ENCOUNTER — Ambulatory Visit: Attending: Orthopaedic Surgery | Admitting: Physical Therapy

## 2025-01-11 DIAGNOSIS — R2689 Other abnormalities of gait and mobility: Secondary | ICD-10-CM | POA: Diagnosis present

## 2025-01-11 DIAGNOSIS — M25562 Pain in left knee: Secondary | ICD-10-CM | POA: Insufficient documentation

## 2025-01-11 DIAGNOSIS — T8459XD Infection and inflammatory reaction due to other internal joint prosthesis, subsequent encounter: Secondary | ICD-10-CM

## 2025-01-11 DIAGNOSIS — M6281 Muscle weakness (generalized): Secondary | ICD-10-CM | POA: Diagnosis present

## 2025-01-11 DIAGNOSIS — M25662 Stiffness of left knee, not elsewhere classified: Secondary | ICD-10-CM | POA: Insufficient documentation

## 2025-01-11 DIAGNOSIS — Z96652 Presence of left artificial knee joint: Secondary | ICD-10-CM | POA: Insufficient documentation

## 2025-01-11 DIAGNOSIS — G8929 Other chronic pain: Secondary | ICD-10-CM

## 2025-01-11 DIAGNOSIS — R600 Localized edema: Secondary | ICD-10-CM | POA: Insufficient documentation

## 2025-01-11 NOTE — Therapy (Signed)
 " OUTPATIENT PHYSICAL THERAPY LOWER EXTREMITY EVALUATION   Patient Name: Richard Harding MRN: [id number] DOB:1946/12/16, 79 y.o., male Today's Date: 01/11/2025  END OF SESSION:  PT End of Session - 01/11/25 1506     Visit Number 1    Number of Visits 13    Date for Recertification  02/22/25    Authorization Type BC[employer]    PT Start Time 1448    PT Stop Time 1530    PT Time Calculation (min) 42 min    Activity Tolerance Patient tolerated treatment well    Behavior During Therapy [facility] for tasks assessed/performed          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   Left leg swelling 10/05/2022   Limited active range of motion (AROM) of cervical spine on rotation    Low testosterone  03/03/2023   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   Type 2 diabetes mellitus (HCC)    followed by pcp   (11-10-2023  pt stated does not check blood sugar)   Unilateral primary osteoarthritis, left knee 04/08/2022   Weakness 05/17/2024   Wears glasses    Past Surgical History:   Procedure Laterality Date   ANTERIOR FUSION CERVICAL SPINE  1990   Thinks it was C-3-4  done here at [facility]   BILATERAL CARPAL TUNNEL RELEASE Bilateral    1990s   COLONOSCOPY  2011   EXCISIONAL TOTAL KNEE ARTHROPLASTY WITH ANTIBIOTIC SPACERS Left 03/24/2024   Procedure: LEFT KNEE SYNOVECTOMY, IRRIGATION AND DEBRIDEMENT WITH POLY EXCHANGE;  Surgeon: Vernetta Lonni GRADE, MD;  Location: [facility];  Service: Orthopedics;  Laterality: Left;   GOLD SEED IMPLANT N/A 11/23/2023   Procedure: GOLD SEED IMPLANT;  Surgeon: Roseann Adine PARAS., MD;  Location: [facility];  Service: Urology;  Laterality: N/A;   SPACE OAR INSTILLATION N/A 11/23/2023   Procedure: SPACE OAR INSTILLATION;  Surgeon: Roseann Adine PARAS., MD;  Location: [facility];  Service: Urology;  Laterality: N/A;   TOTAL KNEE ARTHROPLASTY Right 04/26/2020   Procedure: RIGHT TOTAL KNEE ARTHROPLASTY;  Surgeon: Vernetta Lonni GRADE, MD;  Location: [facility];  Service: Orthopedics;  Laterality: Right;   TOTAL KNEE ARTHROPLASTY Left 09/25/2022   Procedure: LEFT TOTAL KNEE ARTHROPLASTY;  Surgeon: Vernetta Lonni GRADE, MD;  Location: [facility];  Service: Orthopedics;  Laterality: Left;   Patient Active Problem List   Diagnosis Date Noted   Chronic pain of left knee 11/28/2024   Wears glasses    Tuberculosis    OA (osteoarthritis)  Limited active range of motion (AROM) of cervical spine on rotation    Dyspnea on exertion 11/07/2024   Chronic nonintractable headache 11/07/2024   Weakness 05/17/2024   Medication management 05/02/2024   Prosthetic joint infection, subsequent encounter 04/11/2024   Medication monitoring encounter 04/11/2024   Infection of total left knee replacement 03/24/2024   Malignant neoplasm prostate (HCC) 10/06/2023   Low testosterone  03/03/2023   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   Elevated PSA 02/23/2023   Left leg swelling  10/05/2022   History of left knee replacement 09/25/2022   Chest pain 05/26/2022   Unilateral primary osteoarthritis, left knee 04/08/2022   Synovitis of left knee 03/26/2022   Rash 03/26/2022   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/26/2020   RBBB 04/17/2020   Hypokalemia 01/12/2019   Type 2 diabetes mellitus (HCC) 09/16/2017   Hypertension 01/18/2014   Hyperlipidemia, mixed 01/18/2014   Obesity (BMI 30-39.9) 01/18/2014    PCP: Nedra Maxwell NP   REFERRING PROVIDER: Vernetta Lonni GRADE, MD  REFERRING DIAG:  Diagnosis  [phone] (ICD-10-CM) - History of left knee replacement    THERAPY DIAG:  Chronic pain of left knee  Stiffness of left knee, not elsewhere classified  Localized edema  Muscle weakness (generalized)  Other abnormalities of gait and mobility  Rationale for Evaluation and Treatment: Rehabilitation  ONSET DATE: chronic  SUBJECTIVE:   SUBJECTIVE STATEMENT:  Daughter is getting married in April, she lives on the 3rd floor, I need to be able to get up 3 flights of stairs and that's why I'd wanted PT. MD sent me here because my knee is inflamed and he's not sure why- knee is painful when I walk too much or do steps. MD thinks I might have an infection but I have no systemic signs like fever. I've had 2 surgeries on my left knee- one in 2023 (TKA), then the other in 2024 (L knee synovectomy and I&D). Knee pain is unpredictable- some times I'm fine, other times I can't walk more than a few steps. Feel like I need to build up strength in the muscles.   PERTINENT HISTORY: See above, also per MD note:  The patient is a 79 year old well-known to us .  We have replaced both of his knees.  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  He had an incision and drainage/irrigation debridement and poly exchange.  He was on a long course of antibiotics afterwards.  Still reports pain and swelling with the knee.  He is  feels like it is not infected and that he just needs therapy.  He is taking a lot of vitamin C  as well.   On exam the left knee is swollen but there is no redness.  The incision is healed nicely.   I am still concerned that there is residual infection in the knee.  However he would like to at least try some physical therapy first.  His knee does hurt globally.  He denies any fever or chills.  He would like to have outpatient physical therapy at [facility] as outpatient facility at [place] [facility] so we will work on getting that ordered and then we will see him back in about 6 weeks.  At that visit we will likely order new labs and continue to recommend an aspiration of his left knee.  No x-rays are needed at the next visit. PAIN:  Are you having pain?  Yes: NPRS scale: 2/10 now, at worst 7-8/10  Pain location: R knee as a whole  Pain description: constant, annoying  Aggravating factors: can be unpredictable  Relieving factors: alieve in the AM   PRECAUTIONS: None  RED FLAGS: None   WEIGHT BEARING RESTRICTIONS: No  FALLS:  Has patient fallen in last 6 months? No  LIVING ENVIRONMENT: Lives with: lives alone Lives in: House/apartment   OCCUPATION: retired- research officer, trade union in lab [employer]   PLOF: Independent, Independent with basic ADLs, Independent with gait, and Independent with transfers  PATIENT GOALS: be able to do 3 flights of stairs (no landing), be able to go to daughter's wedding in late April   NEXT MD VISIT: Referring 02/05/25  OBJECTIVE:  Note: Objective measures were completed at Evaluation unless otherwise noted.  DIAGNOSTIC FINDINGS:   2 views of the left knee show well-seated press-fit total knee  arthroplasty with no complicating features.  PATIENT SURVEYS:  PSFS: THE PATIENT SPECIFIC FUNCTIONAL SCALE  Place score of 0-10 (0 = unable to perform activity and 10 = able to perform activity at the same level as before injury or problem)  Activity Date: 01/11/25 eval      Steps  Difficult but cannot put number     2.  Walking  Difficult but cannot put number     3.       4.      Total Score N/a       Total Score = Sum of activity scores/number of activities  Minimally Detectable Change: 3 points (for single activity); 2 points (for average score)  [facility] (nd). The Patient Specific Functional Scale . Retrieved from [email or web address]   COGNITION: Overall cognitive status: Within functional limits for tasks assessed     SENSATION: Not tested  EDEMA:   Very edematous with pitting edema- had doppler done 11/28/24 which was negative for clot, pt reports edema has stayed stable since. Noted extra bulge of edema lateral L knee   Circumferential through midline of knee cap with knee straight 46cm L, 41cm through tibial tuberosity ; R 42cm though midline of kneecap, 37.5cm tibial tuberosity   PALPATION:  Some tenderness noted posterior calf, none in HS   LOWER EXTREMITY ROM:  Active ROM Right eval Left eval  Hip flexion    Hip extension    Hip abduction    Hip adduction    Hip internal rotation    Hip external rotation    Knee flexion  100*  Knee extension  -18*  Ankle dorsiflexion    Ankle plantarflexion    Ankle inversion    Ankle eversion     (Blank rows = not tested)  LOWER EXTREMITY MMT:  MMT Right eval Left eval  Hip flexion 3+ 3+  Hip extension    Hip abduction    Hip adduction    Hip internal rotation    Hip external rotation    Knee flexion 4+ 4+  Knee extension 5 5  Ankle dorsiflexion    Ankle plantarflexion    Ankle inversion    Ankle eversion     (Blank rows = not tested)  TREATMENT DATE:   01/11/25   Eval, POC, education as below  Discussed basic HEP- he will try this at home/let us  know if any issues      PATIENT EDUCATION:  Education details: exam findings, HEP, POC; discussed that significant amount of edema in his leg is concerning, however  negative doppler 11/2024 and fact that swelling has not become worse in that time since this study are both promising but will monitor closely. Also discussed PT agreement with referring MD that there could be some concern for another staph infection given presentation and symptoms, if he does not improve with PT that might be a sign that sx are infection driven and would require return to MD. Asked him to try HEP at home, let us  know if any concerns or questions and we can review next visit (session time limited) Person educated: Patient Education method: Explanation Education comprehension: verbalized understanding, returned demonstration, and needs further education  HOME EXERCISE PROGRAM:  Access Code: [email or web address] URL: [email or web address] Date: 01/11/2025 Prepared by: Josette Rough   ASSESSMENT:  CLINICAL IMPRESSION: Patient is a 79 y.o. M who was seen today for physical therapy evaluation and treatment for  Diagnosis  Z96.652 (ICD-10-CM) - History of left knee replacement  . Of note, his case is a bit complex with history of L TKA in 2023; he continued to have issues with his knee post-op, ultimately receiving L knee synovectomy with I&D and poly exchange done in April 2025 due to sensitive staph infection. Unfortunately he continues to have pain, swelling, and weakness. He continues to feel that his knee is not infected and would like to focus on strengthening and stair navigation so he can make it to his daughter's wedding in [place] in April without issue. However, upon my exam, I do share concern with referring MD that we are looking at possible residual or ongoing infection in the knee. Additionally, he had quite a bit of edema in his LE- he reports this has not changed since negative doppler in December 2025 but I think it is  worth us  closely monitoring. Will reach out to referring to discuss PT findings/concerns given edema/need for potential 2nd doppler as well. Will make every effort to address pt's concerns while safely progressing knee function and community access.   OBJECTIVE IMPAIRMENTS: Abnormal gait, decreased mobility, difficulty walking, decreased ROM, decreased strength, increased edema, impaired flexibility, obesity, and pain.   ACTIVITY LIMITATIONS: sitting, standing, squatting, stairs, transfers, and locomotion level  PARTICIPATION LIMITATIONS: meal prep, cleaning, laundry, driving, shopping, community activity, yard work, and church  PERSONAL FACTORS: Age, Behavior pattern, Education, Fitness, Past/current experiences, Social background, and Time since onset of injury/illness/exacerbation are also affecting patient's functional outcome.   REHAB POTENTIAL: Fair hx of complications following L TKA, potential for recurrent infection driving sx  CLINICAL DECISION MAKING: Evolving/moderate complexity  EVALUATION COMPLEXITY: Moderate   GOALS: Goals reviewed with patient? No  SHORT TERM GOALS: Target date: 02/01/2025   Will be compliant with appropriate progressive HEP  Baseline: Goal status: INITIAL  2.  Edema circumferential measure to have improved by 2cm L LE  Baseline:  Goal status: INITIAL  3.  L knee AROM to be no more than 10* extension and at least 110* flexion Baseline:  Goal status: INITIAL    LONG TERM GOALS: Target date: 02/22/2025    MMT to be at least 4+/5 in all tested groups  Baseline:  Goal status: INITIAL  2.  Will be able to climb equivalent of 3  story spiral stair case without landings with L knee pain no more than 3/10, standing rest PRN  Baseline:  Goal status: INITIAL  3.  L knee pain to be no more than 3/10 with all functional daily tasks in order to allow him to access community with more ease  Baseline:  Goal status: INITIAL     PLAN:  PT FREQUENCY:  2x/week  PT DURATION: 6 weeks  PLANNED INTERVENTIONS: 97750- Physical Performance Testing, 97110-Therapeutic exercises, 97530- Therapeutic activity, 97112- Neuromuscular re-education, 97535- Self Care, [id number]- Manual therapy, 97116- Gait training, and 97016- Vasopneumatic device  PLAN FOR NEXT SESSION: did referring respond about possible 2nd doppler? Monitor edema in L LE carefully/adjust session PRN if not, also monitor for progressive signs of infection. ROM, proximal strengthening, stair training, general conditioning   Josette Rough, PT, DPT 01/11/25 4:01 PM  "

## 2025-01-16 ENCOUNTER — Telehealth: Payer: Self-pay

## 2025-01-16 NOTE — Telephone Encounter (Signed)
 Patient aware and they will call him back

## 2025-01-16 NOTE — Telephone Encounter (Signed)
 Patient wondering why we are ordering another oppler since his last doppler was negative? Because of the swelling?

## 2025-01-18 ENCOUNTER — Ambulatory Visit: Admitting: Physical Therapy

## 2025-01-25 ENCOUNTER — Ambulatory Visit: Admitting: Physical Therapy

## 2025-01-25 ENCOUNTER — Encounter: Payer: Self-pay | Admitting: Physical Therapy

## 2025-01-25 DIAGNOSIS — M25662 Stiffness of left knee, not elsewhere classified: Secondary | ICD-10-CM

## 2025-01-25 DIAGNOSIS — G8929 Other chronic pain: Secondary | ICD-10-CM

## 2025-01-25 DIAGNOSIS — M6281 Muscle weakness (generalized): Secondary | ICD-10-CM

## 2025-01-25 DIAGNOSIS — R600 Localized edema: Secondary | ICD-10-CM

## 2025-01-25 DIAGNOSIS — R2689 Other abnormalities of gait and mobility: Secondary | ICD-10-CM

## 2025-01-25 NOTE — Therapy (Signed)
 " OUTPATIENT PHYSICAL THERAPY LOWER EXTREMITY TREATMENT   Patient Name: Richard Harding MRN: [id number] DOB:01-Nov-1946, 79 y.o., male Today's Date: 01/25/2025  END OF SESSION:  PT End of Session - 01/25/25 1354     Visit Number 2    Number of Visits 13    Date for Recertification  02/22/25    Authorization Type [employer]    PT Start Time 1400    PT Stop Time 1445    PT Time Calculation (min) 45 min    Activity Tolerance Patient tolerated treatment well    Behavior During Therapy [facility] for tasks assessed/performed           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   Left leg swelling 10/05/2022   Limited active range of motion (AROM) of cervical spine on rotation    Low testosterone  03/03/2023   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   Type 2 diabetes mellitus (HCC)    followed by pcp   (11-10-2023  pt stated does not check blood sugar)   Unilateral primary osteoarthritis, left knee 04/08/2022   Weakness 05/17/2024   Wears glasses    Past Surgical History:   Procedure Laterality Date   ANTERIOR FUSION CERVICAL SPINE  1990   Thinks it was C-3-4  done here at [facility]   BILATERAL CARPAL TUNNEL RELEASE Bilateral    1990s   COLONOSCOPY  2011   EXCISIONAL TOTAL KNEE ARTHROPLASTY WITH ANTIBIOTIC SPACERS Left 03/24/2024   Procedure: LEFT KNEE SYNOVECTOMY, IRRIGATION AND DEBRIDEMENT WITH POLY EXCHANGE;  Surgeon: Vernetta Lonni GRADE, MD;  Location: [facility];  Service: Orthopedics;  Laterality: Left;   GOLD SEED IMPLANT N/A 11/23/2023   Procedure: GOLD SEED IMPLANT;  Surgeon: Roseann Adine PARAS., MD;  Location: [facility];  Service: Urology;  Laterality: N/A;   SPACE OAR INSTILLATION N/A 11/23/2023   Procedure: SPACE OAR INSTILLATION;  Surgeon: Roseann Adine PARAS., MD;  Location: [facility];  Service: Urology;  Laterality: N/A;   TOTAL KNEE ARTHROPLASTY Right 04/26/2020   Procedure: RIGHT TOTAL KNEE ARTHROPLASTY;  Surgeon: Vernetta Lonni GRADE, MD;  Location: [facility];  Service: Orthopedics;  Laterality: Right;   TOTAL KNEE ARTHROPLASTY Left 09/25/2022   Procedure: LEFT TOTAL KNEE ARTHROPLASTY;  Surgeon: Vernetta Lonni GRADE, MD;  Location: [facility];  Service: Orthopedics;  Laterality: Left;   Patient Active Problem List   Diagnosis Date Noted   Chronic pain of left knee 11/28/2024   Wears glasses    Tuberculosis    OA (osteoarthritis)  Limited active range of motion (AROM) of cervical spine on rotation    Dyspnea on exertion 11/07/2024   Chronic nonintractable headache 11/07/2024   Weakness 05/17/2024   Medication management 05/02/2024   Prosthetic joint infection, subsequent encounter 04/11/2024   Medication monitoring encounter 04/11/2024   Infection of total left knee replacement 03/24/2024   Malignant neoplasm prostate (HCC) 10/06/2023   Low testosterone  03/03/2023   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   Elevated PSA 02/23/2023   Left leg swelling  10/05/2022   History of left knee replacement 09/25/2022   Chest pain 05/26/2022   Unilateral primary osteoarthritis, left knee 04/08/2022   Synovitis of left knee 03/26/2022   Rash 03/26/2022   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/26/2020   RBBB 04/17/2020   Hypokalemia 01/12/2019   Type 2 diabetes mellitus (HCC) 09/16/2017   Hypertension 01/18/2014   Hyperlipidemia, mixed 01/18/2014   Obesity (BMI 30-39.9) 01/18/2014    PCP: Nedra Maxwell NP   REFERRING PROVIDER: Vernetta Lonni GRADE, MD  REFERRING DIAG:  Diagnosis  [phone] (ICD-10-CM) - History of left knee replacement    THERAPY DIAG:  Chronic pain of left knee  Stiffness of left knee, not elsewhere classified  Localized edema  Muscle weakness (generalized)  Other abnormalities of gait and mobility  Rationale for Evaluation and Treatment: Rehabilitation  ONSET DATE: chronic  SUBJECTIVE:   SUBJECTIVE STATEMENT: Pt states that he has inc soreness in his L knee today, reports may be due to the weather recently. Pt notes that he will need to negotiate 3 floors of consecutive steps, will have handrail use when negotiating.   EVAL: Daughter is getting married in April, she lives on the 3rd floor, I need to be able to get up 3 flights of stairs and that's why I'd wanted PT. MD sent me here because my knee is inflamed and he's not sure why- knee is painful when I walk too much or do steps. MD thinks I might have an infection but I have no systemic signs like fever. I've had 2 surgeries on my left knee- one in 2023 (TKA), then the other in 2024 (L knee synovectomy and I&D). Knee pain is unpredictable- some times I'm fine, other times I can't walk more than a few steps. Feel like I need to build up strength in the muscles.   PERTINENT HISTORY: See above, also per MD note:  The patient is a 80 year old well-known to us .  We have replaced both of his knees.  Unfortunately his left knee last year in April was found  to have a sensitive staph infection.  He had an incision and drainage/irrigation debridement and poly exchange.  He was on a long course of antibiotics afterwards.  Still reports pain and swelling with the knee.  He is feels like it is not infected and that he just needs therapy.  He is taking a lot of vitamin C  as well.   On exam the left knee is swollen but there is no redness.  The incision is healed nicely.   I am still concerned that there is residual infection in the knee.  However he would like to at least try some physical therapy first.  His knee does hurt globally.  He denies any fever or chills.  He would like to have outpatient physical therapy at [facility] facility [facility] so we will work on getting that ordered and then we will see  him back in about 6 weeks.  At that visit we will likely order new labs and continue to recommend an aspiration of his left knee.  No x-rays are needed at the next visit. PAIN:  Are you having pain? Yes: NPRS scale: 2/10 now, at worst 7-8/10  Pain location: R knee as a whole  Pain description: constant, annoying  Aggravating factors: can be unpredictable  Relieving factors: alieve in the AM   PRECAUTIONS: None  RED FLAGS: None   WEIGHT BEARING RESTRICTIONS: No  FALLS:  Has patient fallen in last 6 months? No  LIVING ENVIRONMENT: Lives with: lives alone Lives in: House/apartment   OCCUPATION: retired- research officer, trade union in lab [employer]   PLOF: Independent, Independent with basic ADLs, Independent with gait, and Independent with transfers  PATIENT GOALS: be able to do 3 flights of stairs (no landing), be able to go to daughter's wedding in late April   NEXT MD VISIT: Referring 02/05/25  OBJECTIVE:  Note: Objective measures were completed at Evaluation unless otherwise noted.  DIAGNOSTIC FINDINGS:   2 views of the left knee show well-seated press-fit total knee  arthroplasty with no complicating features.  PATIENT SURVEYS:   PSFS: THE PATIENT SPECIFIC FUNCTIONAL SCALE  Place score of 0-10 (0 = unable to perform activity and 10 = able to perform activity at the same level as before injury or problem)  Activity Date: 01/11/25 eval     Steps  Difficult but cannot put number     2.  Walking  Difficult but cannot put number     3.       4.      Total Score N/a       Total Score = Sum of activity scores/number of activities  Minimally Detectable Change: 3 points (for single activity); 2 points (for average score)  [facility] (nd). The Patient Specific Functional Scale . Retrieved from [email or web address]   COGNITION: Overall cognitive status: Within functional limits for tasks assessed     SENSATION: Not tested  EDEMA:   Very edematous with pitting edema- had doppler done 11/28/24 which was negative for clot, pt reports edema has stayed stable since. Noted extra bulge of edema lateral L knee   Circumferential through midline of knee cap with knee straight 46cm L, 41cm through tibial tuberosity ; R 42cm though midline of kneecap, 37.5cm tibial tuberosity   PALPATION:  Some tenderness noted posterior calf, none in HS   LOWER EXTREMITY ROM:  Active ROM Right eval Left eval  Hip flexion    Hip extension    Hip abduction    Hip adduction    Hip internal rotation    Hip external rotation    Knee flexion  100*  Knee extension  -18*  Ankle dorsiflexion    Ankle plantarflexion    Ankle inversion    Ankle eversion     (Blank rows = not tested)  LOWER EXTREMITY MMT:  MMT Right eval Left eval  Hip flexion 3+ 3+  Hip extension    Hip abduction    Hip adduction    Hip internal rotation    Hip external rotation    Knee flexion 4+ 4+  Knee extension 5 5  Ankle dorsiflexion    Ankle plantarflexion    Ankle inversion    Ankle eversion     (Blank rows = not tested)  TREATMENT DATE:   [facility] Adult PT Treatment:                                                DATE: 01/25/25 Therapeutic Exercise: NuStep x6 mins level 5, seat 10, UE 10 LAQ 3x10 alternating between reps Hamstring isometrics 2x5x10s BLE in short sit, physioball resistance Standing hip abd 2x10 BLE with support, alt between reps Mini squats 3x10 with support    01/11/25   Eval, POC, education as below  Discussed basic HEP- he will try this at home/let us  know if any issues     PATIENT EDUCATION:  Education details: exam findings, HEP, POC; discussed that significant amount of edema in his leg is concerning, however  negative doppler 11/2024 and fact that swelling has not become worse in that time since this study are both promising but will monitor closely. Also discussed PT agreement with referring MD that there could be some concern for another staph infection given presentation and symptoms, if he does not improve with PT that might be a sign that sx are infection driven and would require return to MD. Asked him to try HEP at home, let us  know if any concerns or questions and we can review next visit (session time limited) Person educated: Patient Education method: Explanation Education comprehension: verbalized understanding, returned demonstration, and needs further education  HOME EXERCISE PROGRAM:  Access Code: [email or web address] URL: [email or web address] Date: 01/25/2025 Prepared by: Stann Ohara  Exercises - Ankle Pumps in Elevation  - 2-3 x daily - 7 x weekly - 1 sets - 20 reps - Seated Active Straight-Leg Raise  - 2 x daily - 7 x weekly - 1 sets - 10 reps - Seated Knee Flexion AAROM  - 2 x daily - 7 x weekly - 1 sets - 10 reps - 2 seconds  hold - Seated March with Resistance  - 2 x daily - 7 x weekly - 1 sets - 10 reps - Standing Hip Abduction with Counter Support  - 1 x daily - 4 x weekly - 3  sets - 10 reps - 2 hold - Seated Long Arc Quad  - 1 x daily - 4 x weekly - 3 sets - 10 reps - 2 hold   ASSESSMENT:  CLINICAL IMPRESSION:  Pt tolerated session well, reports that psychologically, better with todays session. Able to progress functional movements with good tolerance. Any symptom increase is followed by resolution of said symptoms with <60 sec break. Pt HEP updated based on tolerance to todays interventions.    Patient is a 79 y.o. M who was seen today for physical therapy evaluation and treatment for  Diagnosis  Z96.652 (ICD-10-CM) - History of left knee replacement  . Of note, his case is a bit complex with history of L TKA in 2023; he continued to have issues with his knee post-op, ultimately receiving L knee synovectomy with I&D and poly exchange done in April 2025 due to sensitive staph infection. Unfortunately he continues to have pain, swelling, and weakness. He continues to feel that his knee is not infected and would like to focus on strengthening and stair navigation so he can make it to his daughter's wedding in [place] in April without issue. However, upon my exam, I do share concern with referring MD that we are looking at possible residual or ongoing infection in  the knee. Additionally, he had quite a bit of edema in his LE- he reports this has not changed since negative doppler in December 2025 but I think it is worth us  closely monitoring. Will reach out to referring to discuss PT findings/concerns given edema/need for potential 2nd doppler as well. Will make every effort to address pt's concerns while safely progressing knee function and community access.   OBJECTIVE IMPAIRMENTS: Abnormal gait, decreased mobility, difficulty walking, decreased ROM, decreased strength, increased edema, impaired flexibility, obesity, and pain.   ACTIVITY LIMITATIONS: sitting, standing, squatting, stairs, transfers, and locomotion level  PARTICIPATION LIMITATIONS: meal prep, cleaning,  laundry, driving, shopping, community activity, yard work, and church  PERSONAL FACTORS: Age, Behavior pattern, Education, Fitness, Past/current experiences, Social background, and Time since onset of injury/illness/exacerbation are also affecting patient's functional outcome.   REHAB POTENTIAL: Fair hx of complications following L TKA, potential for recurrent infection driving sx  CLINICAL DECISION MAKING: Evolving/moderate complexity  EVALUATION COMPLEXITY: Moderate   GOALS: Goals reviewed with patient? No  SHORT TERM GOALS: Target date: 02/01/2025   Will be compliant with appropriate progressive HEP  Baseline: Goal status: INITIAL  2.  Edema circumferential measure to have improved by 2cm L LE  Baseline:  Goal status: INITIAL  3.  L knee AROM to be no more than 10* extension and at least 110* flexion Baseline:  Goal status: INITIAL    LONG TERM GOALS: Target date: 02/22/2025    MMT to be at least 4+/5 in all tested groups  Baseline:  Goal status: INITIAL  2.  Will be able to climb equivalent of 3 story spiral stair case without landings with L knee pain no more than 3/10, standing rest PRN  Baseline:  Goal status: INITIAL  3.  L knee pain to be no more than 3/10 with all functional daily tasks in order to allow him to access community with more ease  Baseline:  Goal status: INITIAL     PLAN:  PT FREQUENCY: 2x/week  PT DURATION: 6 weeks  PLANNED INTERVENTIONS: 97750- Physical Performance Testing, 97110-Therapeutic exercises, 97530- Therapeutic activity, 97112- Neuromuscular re-education, 97535- Self Care, [id number]- Manual therapy, 97116- Gait training, and 97016- Vasopneumatic device  PLAN FOR NEXT SESSION: did referring respond about possible 2nd doppler? Monitor edema in L LE carefully/adjust session PRN if not, also monitor for progressive signs of infection. ROM, proximal strengthening, stair training, general conditioning   Stann Ohara PT, DPT,  CLT 01/25/25 3:11 PM   "

## 2025-01-26 ENCOUNTER — Other Ambulatory Visit: Payer: Self-pay | Admitting: Nurse Practitioner

## 2025-01-26 NOTE — Telephone Encounter (Signed)
 Requesting: Metoprolol  Tartrate 100 MG Oral Tablet  Last Visit: 11/06/2024 Next Visit: 02/07/2025 Last Refill: 10/30/2024  Please Advise

## 2025-01-29 ENCOUNTER — Ambulatory Visit (HOSPITAL_COMMUNITY)

## 2025-01-31 ENCOUNTER — Ambulatory Visit: Admitting: Physical Therapy

## 2025-02-01 ENCOUNTER — Ambulatory Visit: Admitting: Physical Therapy

## 2025-02-02 ENCOUNTER — Ambulatory Visit (HOSPITAL_COMMUNITY)

## 2025-02-05 ENCOUNTER — Ambulatory Visit: Admitting: Orthopaedic Surgery

## 2025-02-05 ENCOUNTER — Ambulatory Visit (HOSPITAL_BASED_OUTPATIENT_CLINIC_OR_DEPARTMENT_OTHER)

## 2025-02-06 ENCOUNTER — Ambulatory Visit: Admitting: Physical Therapy

## 2025-02-07 ENCOUNTER — Ambulatory Visit: Admitting: Nurse Practitioner

## 2025-02-08 ENCOUNTER — Ambulatory Visit: Admitting: Physical Therapy

## 2025-02-13 ENCOUNTER — Ambulatory Visit (HOSPITAL_COMMUNITY)

## 2025-03-05 ENCOUNTER — Ambulatory Visit

## 2025-05-10 ENCOUNTER — Ambulatory Visit: Admitting: Urology
